# Patient Record
Sex: Male | Born: 1970 | Race: Black or African American | Hispanic: No | Marital: Married | Smoking: Former smoker
Health system: Southern US, Community
[De-identification: ages and names within clinical notes are randomized; demographics above are authoritative.]

## PROBLEM LIST (undated history)

## (undated) DIAGNOSIS — M1990 Unspecified osteoarthritis, unspecified site: Secondary | ICD-10-CM

## (undated) DIAGNOSIS — I10 Essential (primary) hypertension: Secondary | ICD-10-CM

## (undated) DIAGNOSIS — IMO0001 Reserved for inherently not codable concepts without codable children: Secondary | ICD-10-CM

## (undated) DIAGNOSIS — I2510 Atherosclerotic heart disease of native coronary artery without angina pectoris: Secondary | ICD-10-CM

## (undated) DIAGNOSIS — E119 Type 2 diabetes mellitus without complications: Secondary | ICD-10-CM

## (undated) DIAGNOSIS — N182 Chronic kidney disease, stage 2 (mild): Secondary | ICD-10-CM

## (undated) DIAGNOSIS — K219 Gastro-esophageal reflux disease without esophagitis: Secondary | ICD-10-CM

## (undated) DIAGNOSIS — Z794 Long term (current) use of insulin: Secondary | ICD-10-CM

## (undated) DIAGNOSIS — M109 Gout, unspecified: Secondary | ICD-10-CM

## (undated) HISTORY — PX: SHOULDER SURGERY: SHX246

## (undated) HISTORY — PX: KNEE SURGERY: SHX244

## (undated) HISTORY — PX: FRACTURE SURGERY: SHX138

## (undated) HISTORY — DX: Gastro-esophageal reflux disease without esophagitis: K21.9

## (undated) HISTORY — PX: EYE SURGERY: SHX253

---

## 2008-10-30 IMAGING — CR DG FOOT COMPLETE 3+V*R*
3 series · 3 of 3 positions shown · non-contrast
Comparison: Ankle films on same date peri

CLINICAL DATA: Right foot pain after injury.

RIGHT FOOT COMPLETE - 3+ VIEW

[t foot ap right]
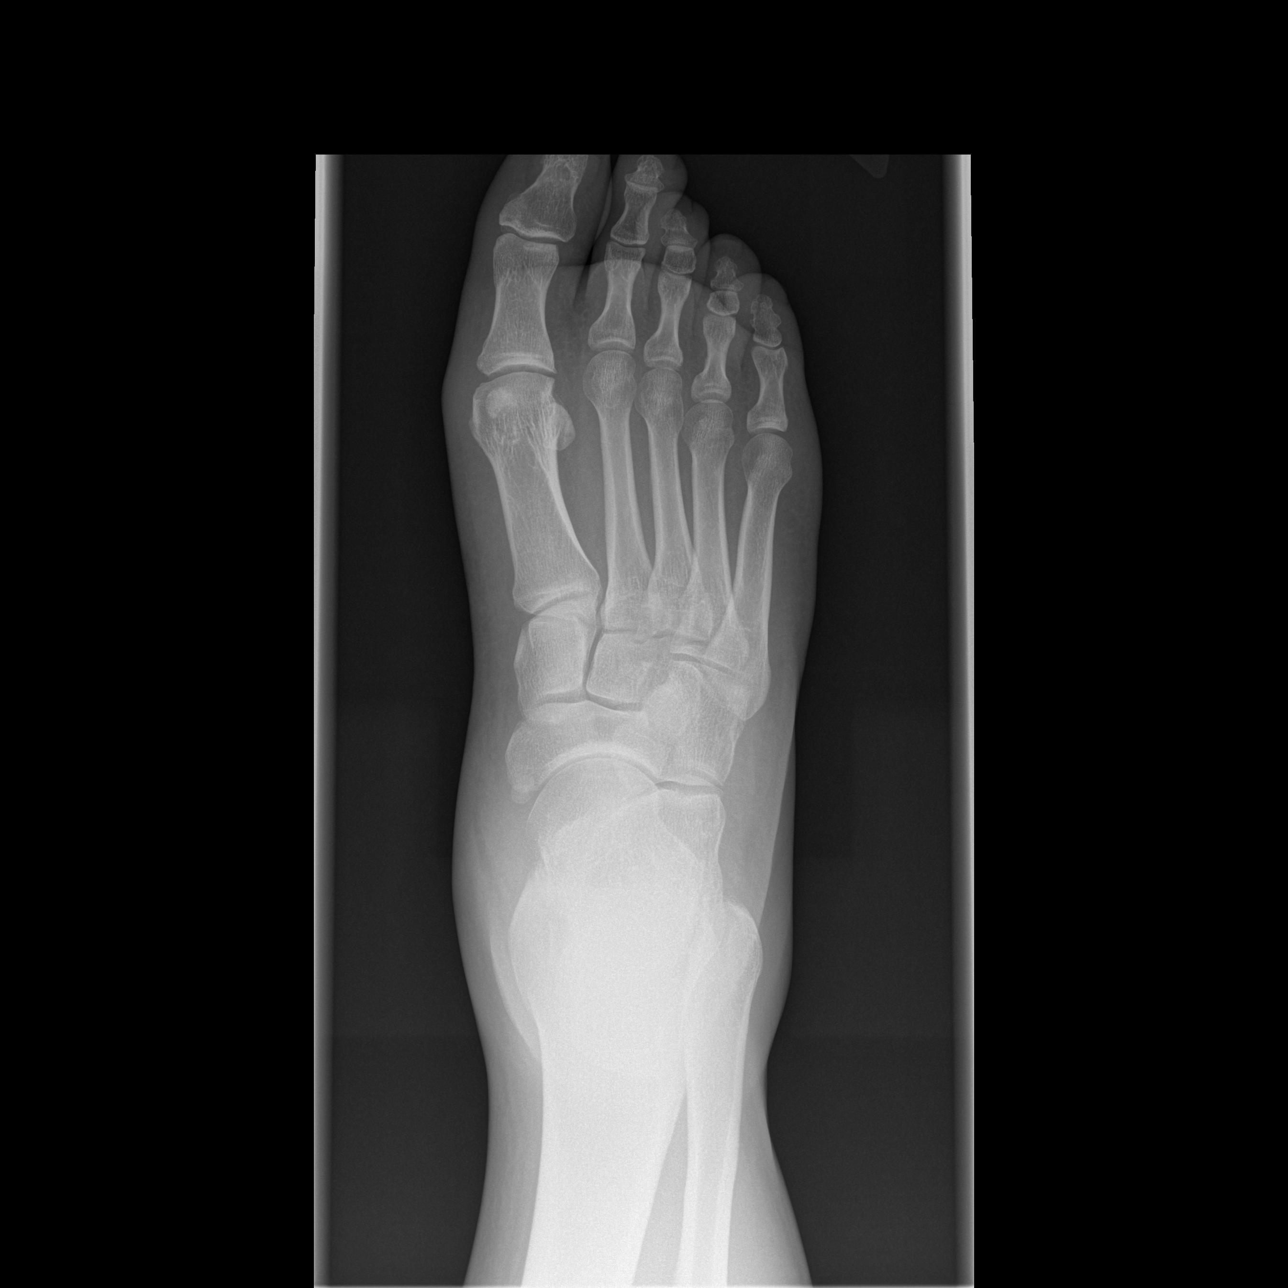

[t foot oblique right]
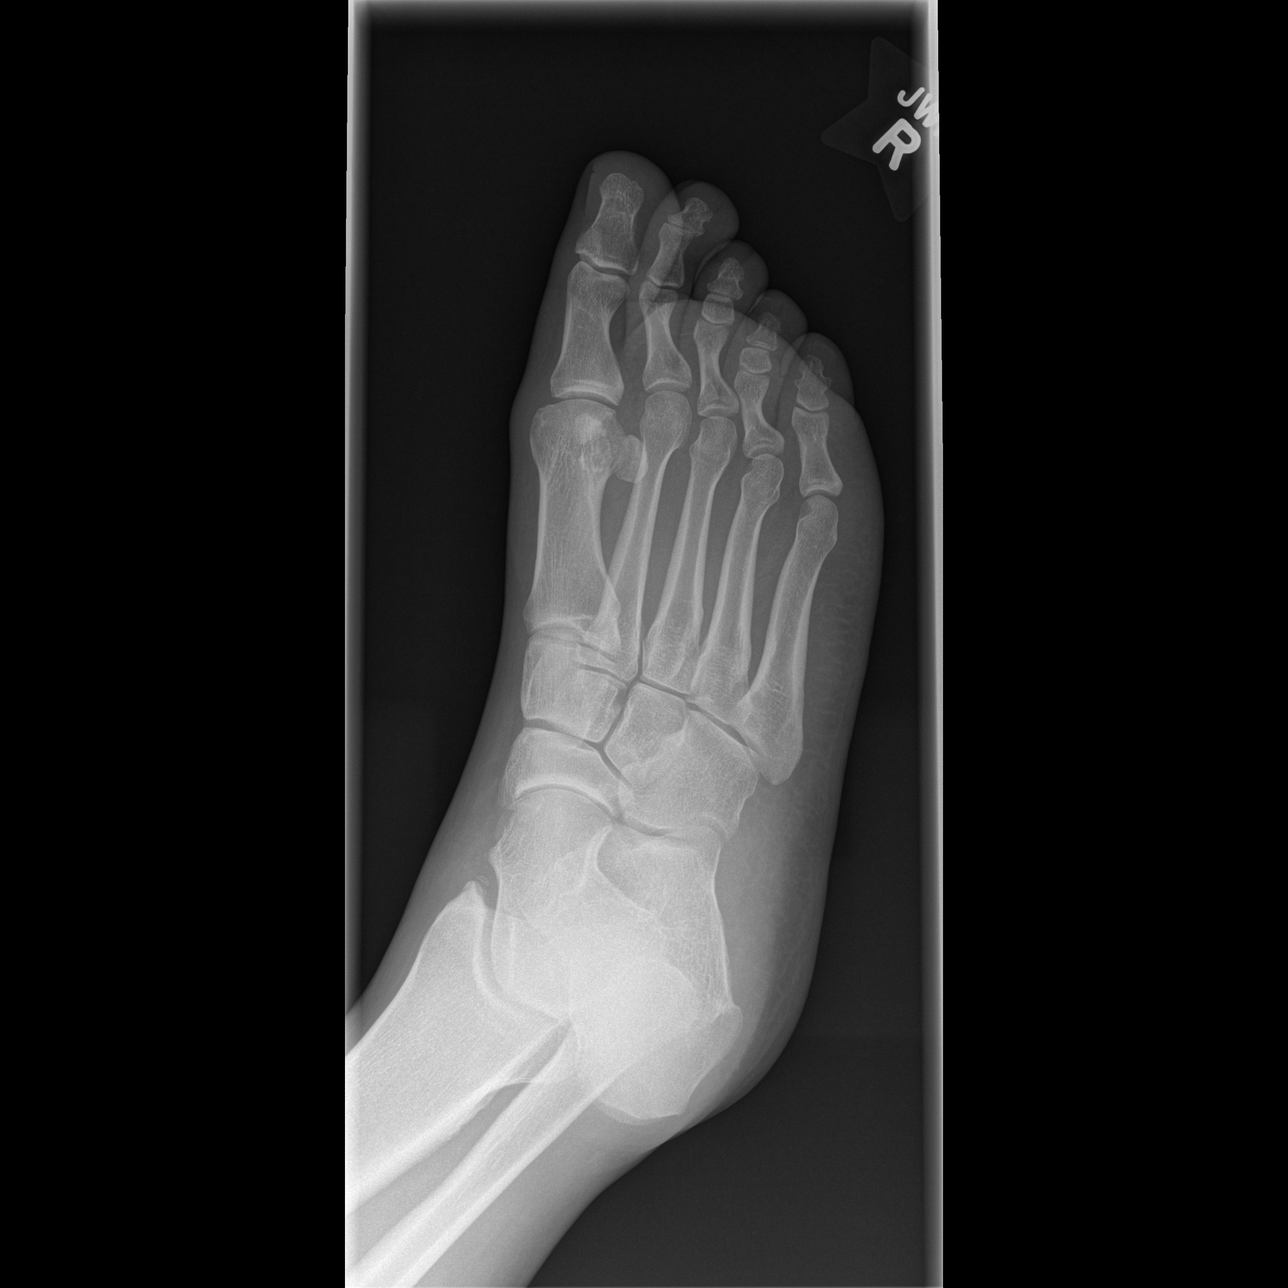

[t foot lat right]
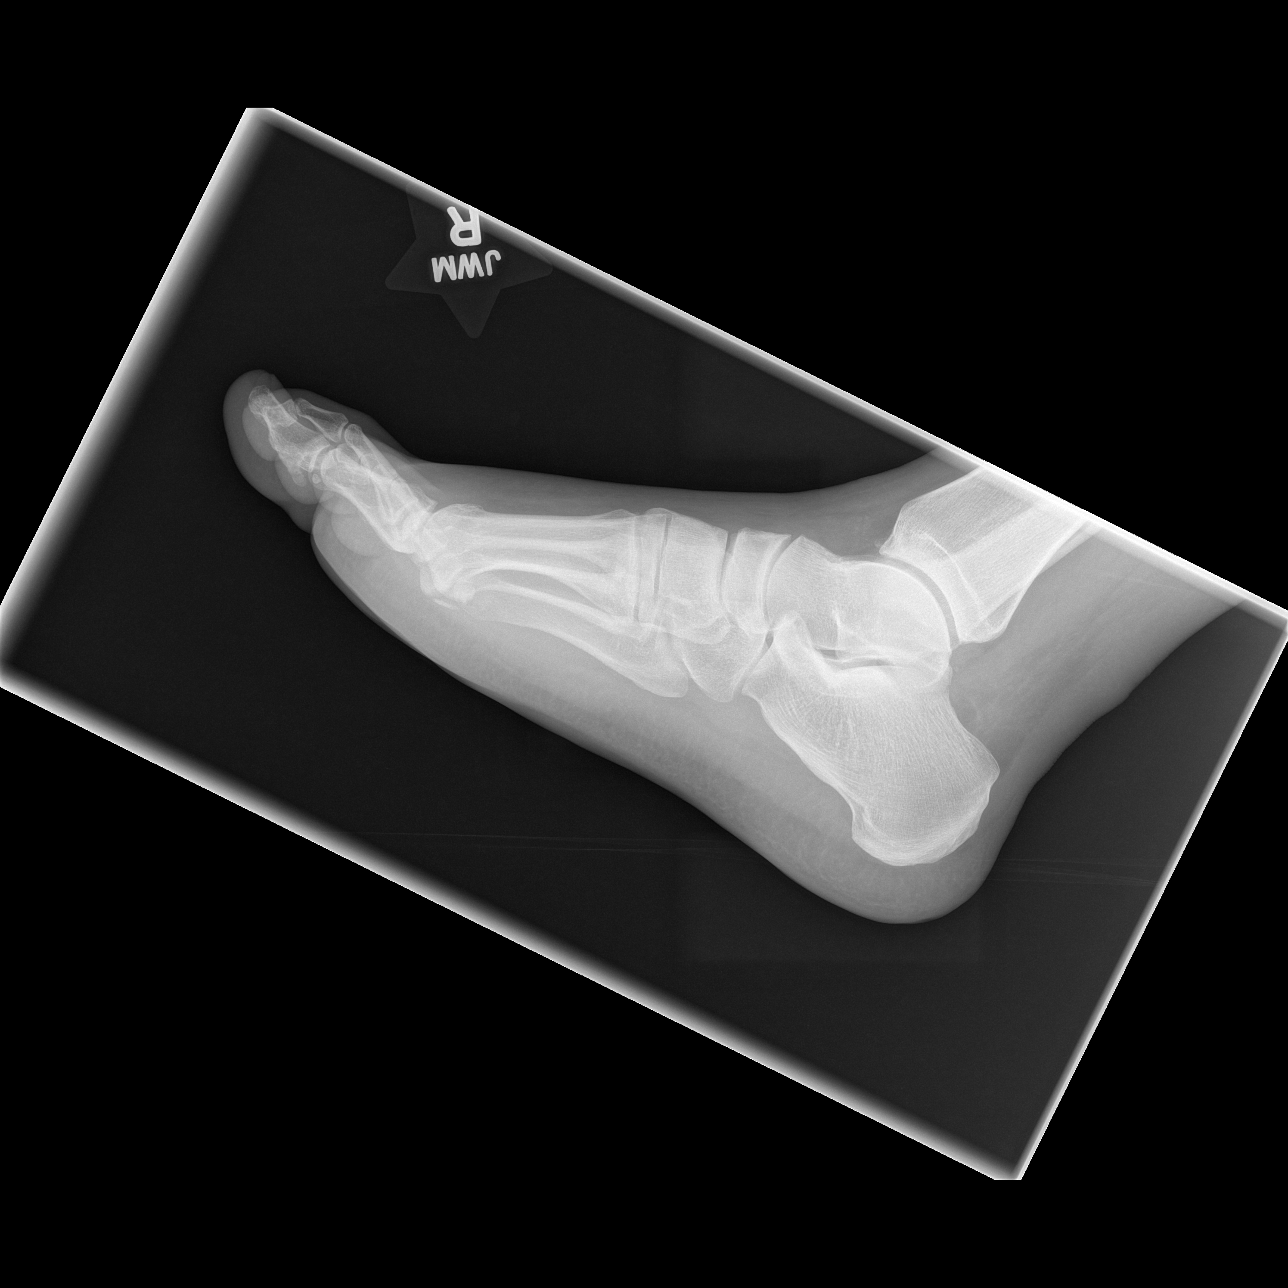

[3 of 3 positions shown; findings below may reference images not displayed]

FINDINGS: No evidence of foot fracture or dislocation.  Soft
tissues of the foot are unremarkable.
IMPRESSION: No fracture of the right foot.

## 2009-06-06 ENCOUNTER — Ambulatory Visit: Payer: Self-pay | Admitting: Interventional Radiology

## 2009-06-06 ENCOUNTER — Emergency Department (HOSPITAL_BASED_OUTPATIENT_CLINIC_OR_DEPARTMENT_OTHER): Admission: EM | Admit: 2009-06-06 | Discharge: 2009-06-06 | Payer: Self-pay | Admitting: Emergency Medicine

## 2009-06-06 IMAGING — CR DG ANKLE COMPLETE 3+V*R*
4 series · 4 of 4 positions shown · non-contrast
Comparison: None

CLINICAL DATA: Right ankle injury with swelling and pain.

RIGHT ANKLE - COMPLETE 3+ VIEW

[t ankle joint lat right]
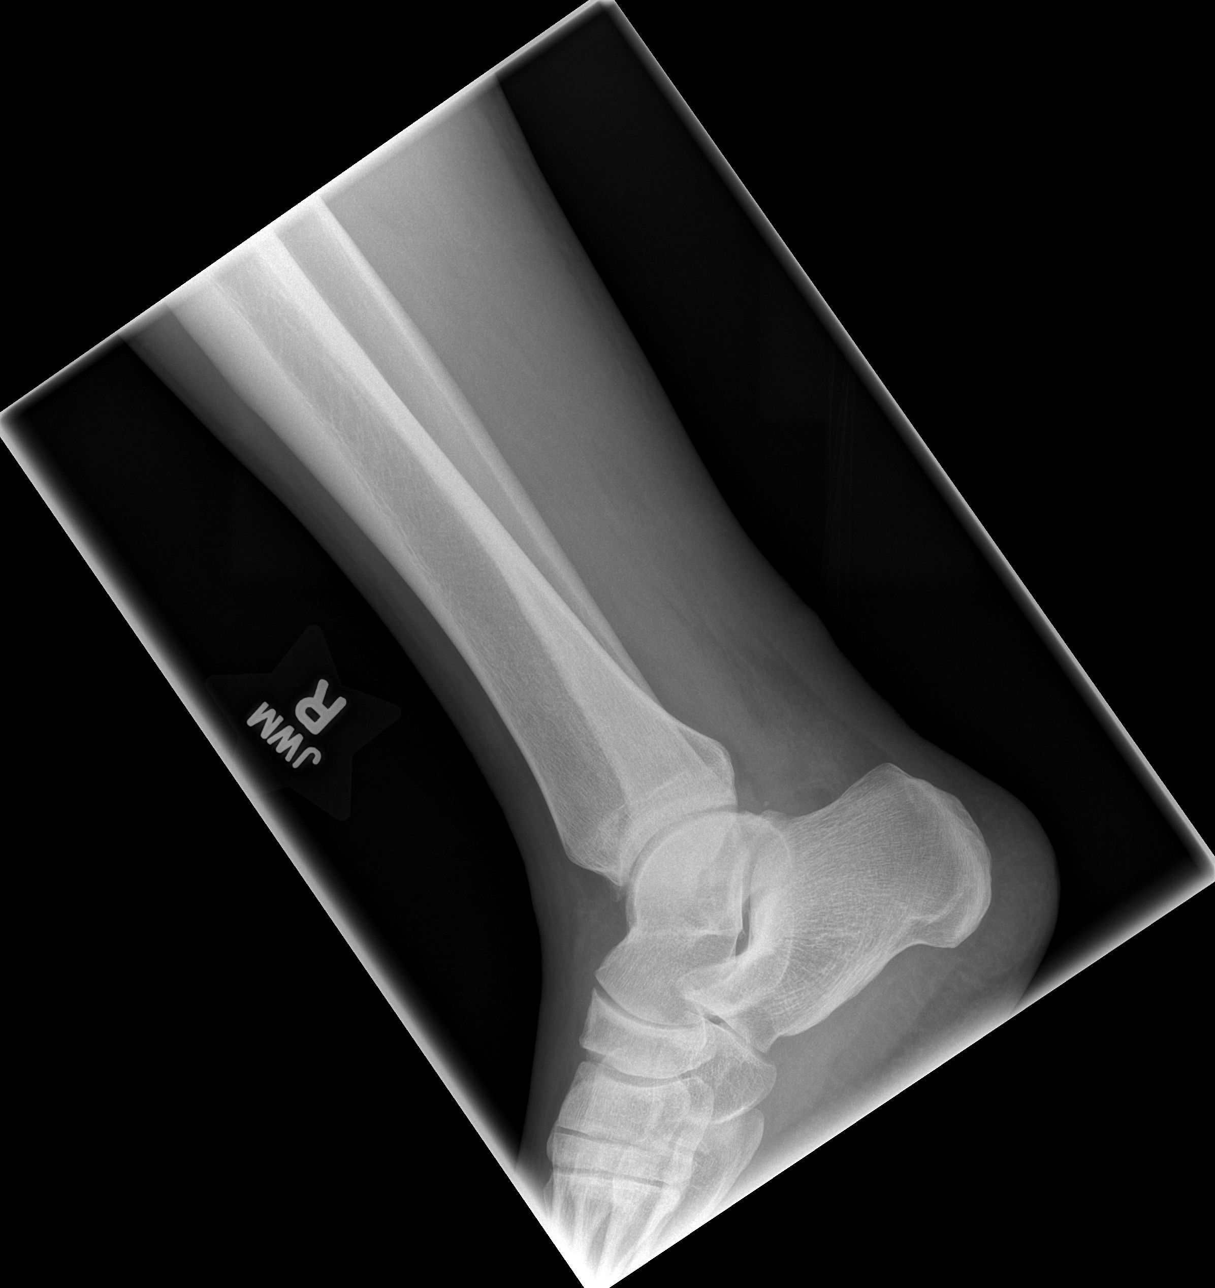

[t ankle joint ap right]
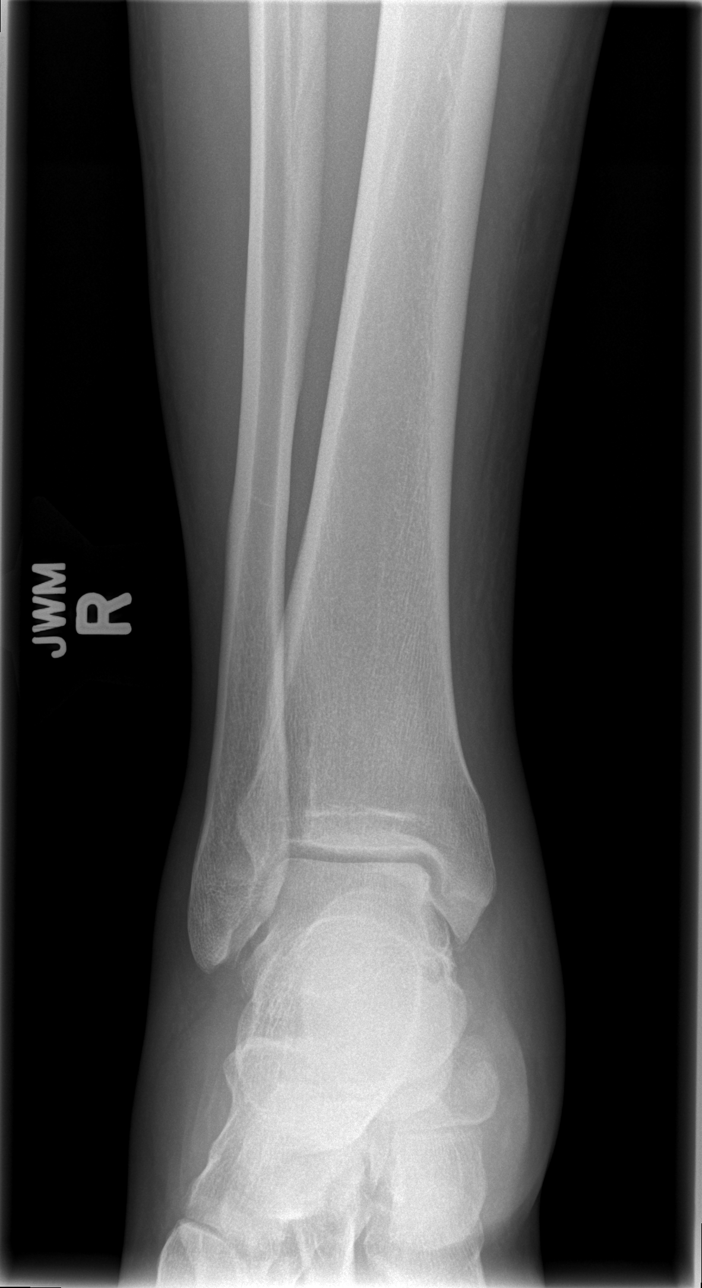

[t ankle joint oblique right (1 of 2)]
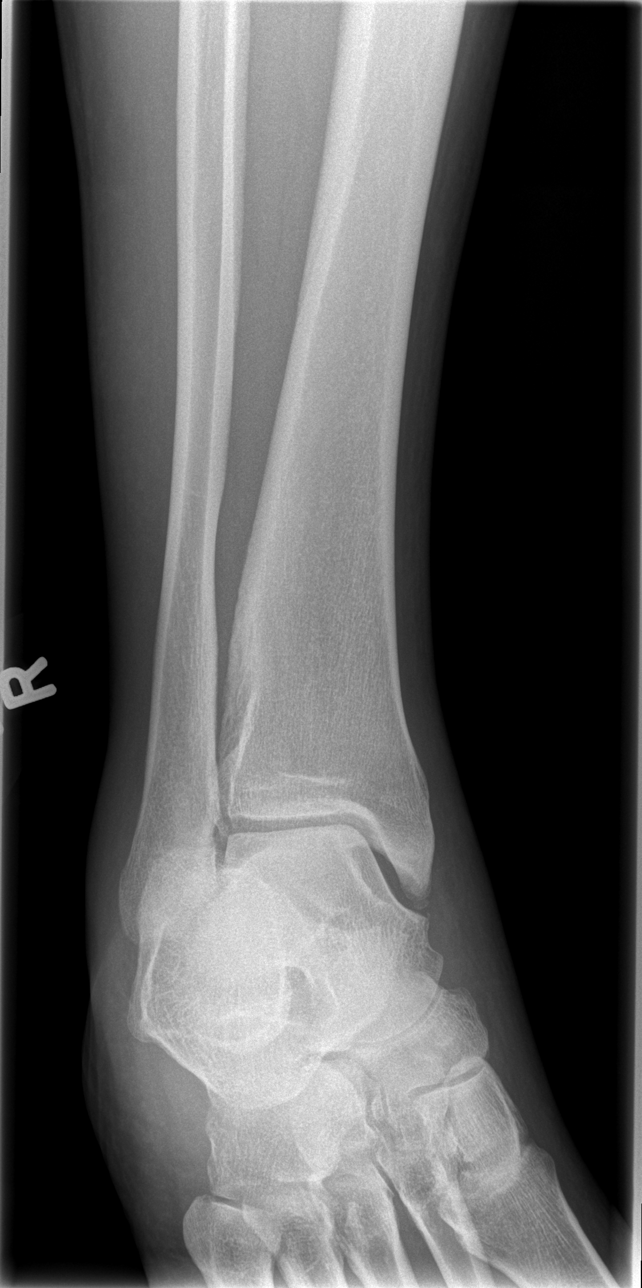

[t ankle joint oblique right (2 of 2)]
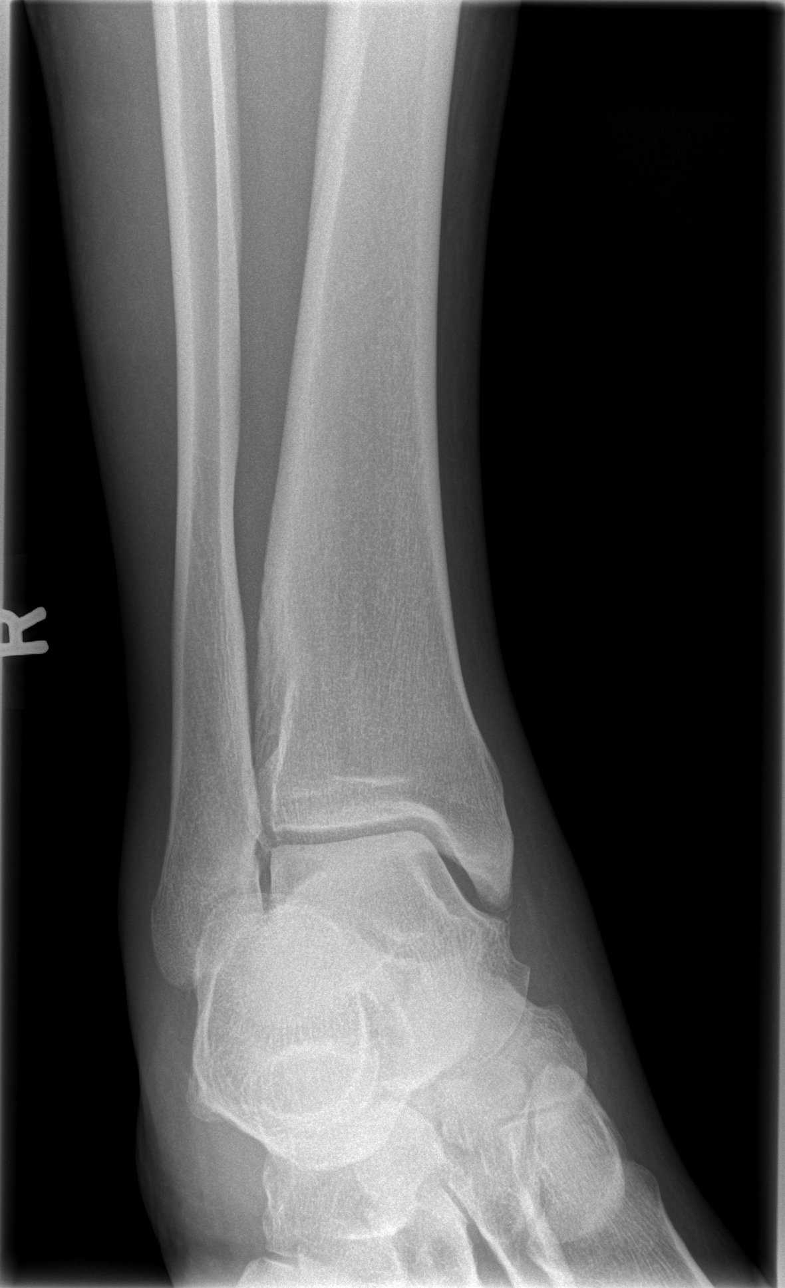

[4 of 4 positions shown; findings below may reference images not displayed]

FINDINGS: No acute bony fracture or dislocation identified.  There
is soft tissue prominence and thickening in the region of the
Achilles tendon.  Tendon injury cannot be excluded.
IMPRESSION: No bony fracture or dislocation.  Suspected Achilles tendon injury
based on soft tissue prominence.

## 2012-03-29 IMAGING — CR DG KNEE 1-2V*R*
2 series · 2 of 2 positions shown · non-contrast
Comparison: Contralateral knee

CLINICAL DATA: Knee pain

RIGHT KNEE - 1-2 VIEW

[t knee ap right]
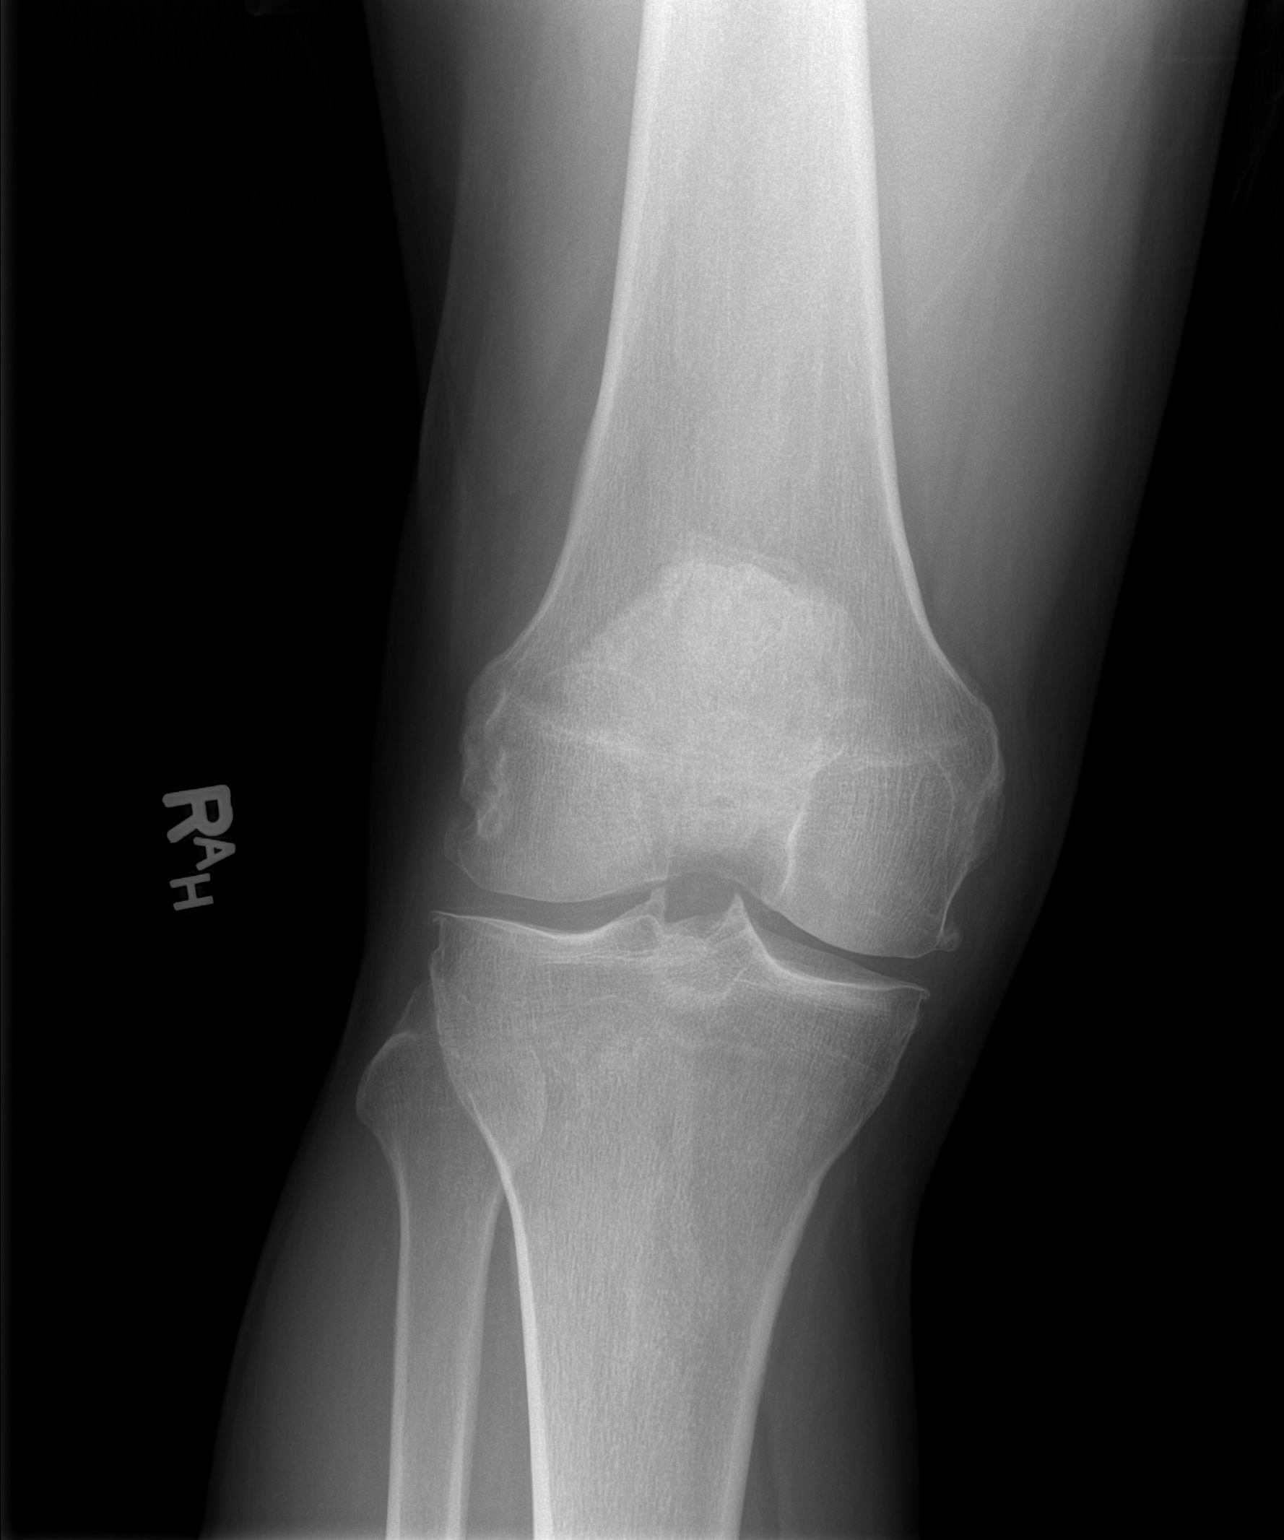

[t knee lat right]
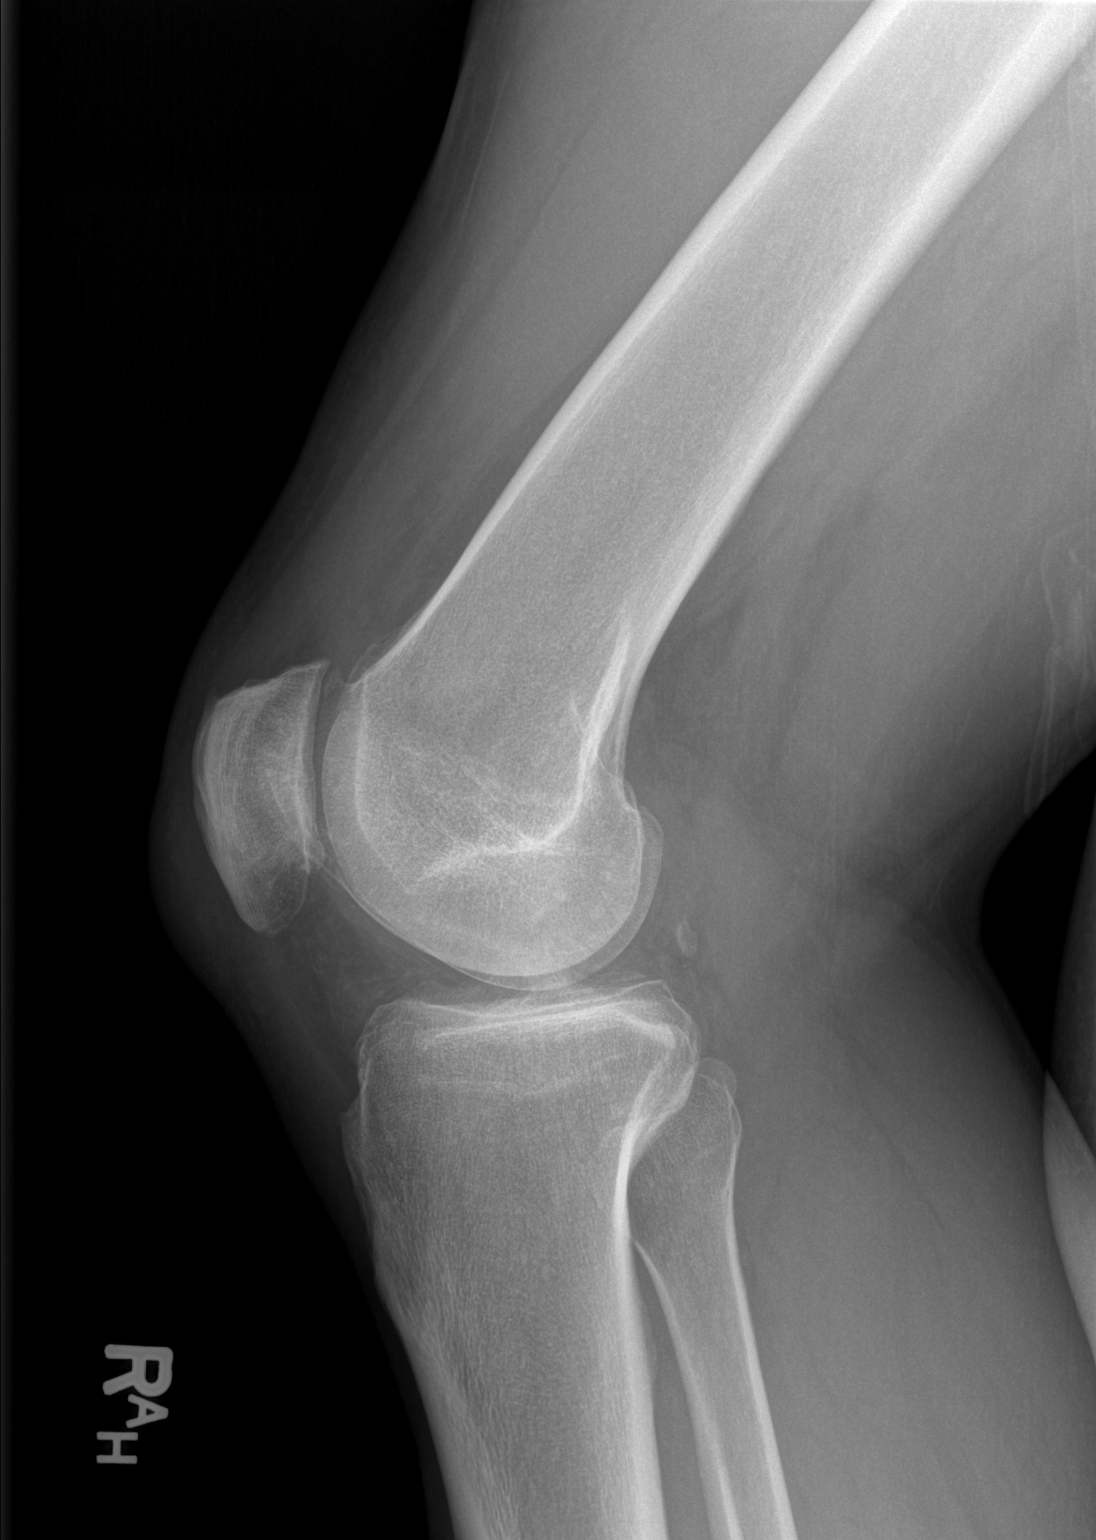

[2 of 2 positions shown; findings below may reference images not displayed]

FINDINGS: Mild tricompartmental degenerative changes.  No acute
fracture or dislocation.  Tiny joint effusion.  No aggressive
osseous lesions.
IMPRESSION: Tricompartmental degenerative changes and tiny joint effusion.

## 2012-08-21 ENCOUNTER — Other Ambulatory Visit: Payer: Self-pay

## 2012-08-21 ENCOUNTER — Encounter (HOSPITAL_BASED_OUTPATIENT_CLINIC_OR_DEPARTMENT_OTHER): Payer: Self-pay | Admitting: *Deleted

## 2012-08-21 ENCOUNTER — Emergency Department (HOSPITAL_BASED_OUTPATIENT_CLINIC_OR_DEPARTMENT_OTHER)
Admission: EM | Admit: 2012-08-21 | Discharge: 2012-08-21 | Disposition: A | Discharge status: Home / Self Care | Payer: BC Managed Care – PPO | Attending: Emergency Medicine | Admitting: Emergency Medicine

## 2012-08-21 DIAGNOSIS — Z79899 Other long term (current) drug therapy: Secondary | ICD-10-CM | POA: Insufficient documentation

## 2012-08-21 DIAGNOSIS — E119 Type 2 diabetes mellitus without complications: Secondary | ICD-10-CM | POA: Insufficient documentation

## 2012-08-21 DIAGNOSIS — R7309 Other abnormal glucose: Secondary | ICD-10-CM | POA: Insufficient documentation

## 2012-08-21 DIAGNOSIS — R739 Hyperglycemia, unspecified: Secondary | ICD-10-CM

## 2012-08-21 LAB — CBC WITH DIFFERENTIAL/PLATELET
Basophils Absolute: 0 10*3/uL (ref 0.0–0.1)
Eosinophils Absolute: 0.1 10*3/uL (ref 0.0–0.7)
Eosinophils Relative: 2 % (ref 0–5)
MCH: 30.7 pg (ref 26.0–34.0)
MCV: 80.2 fL (ref 78.0–100.0)
Platelets: 234 10*3/uL (ref 150–400)
RDW: 12.2 % (ref 11.5–15.5)
WBC: 6.5 10*3/uL (ref 4.0–10.5)

## 2012-08-21 LAB — GLUCOSE, CAPILLARY
Glucose-Capillary: 374 mg/dL — ABNORMAL HIGH (ref 70–99)
Glucose-Capillary: 348 mg/dL — ABNORMAL HIGH (ref 70–99)

## 2012-08-21 LAB — URINALYSIS, ROUTINE W REFLEX MICROSCOPIC
Bilirubin Urine: NEGATIVE
Hgb urine dipstick: NEGATIVE
Protein, ur: NEGATIVE mg/dL
Urobilinogen, UA: 1 mg/dL (ref 0.0–1.0)

## 2012-08-21 LAB — BASIC METABOLIC PANEL
Calcium: 8.9 mg/dL (ref 8.4–10.5)
GFR calc non Af Amer: >90 mL/min (ref >90)
Glucose, Bld: 409 mg/dL — ABNORMAL HIGH (ref 70–99)
Sodium: 131 mEq/L — ABNORMAL LOW (ref 135–145)

## 2012-08-21 MED ORDER — INSULIN REGULAR HUMAN 100 UNIT/ML IJ SOLN
INTRAMUSCULAR | Status: AC
  Filled 2012-08-21: qty 1

## 2012-08-21 MED ORDER — INSULIN REGULAR HUMAN 100 UNIT/ML IJ SOLN
10.0000 [IU] | Freq: Once | INTRAMUSCULAR | Status: AC
  Administered 2012-08-21: 10 [IU] via INTRAVENOUS

## 2012-08-21 MED ORDER — SODIUM CHLORIDE 0.9 % IV BOLUS (SEPSIS)
1000.0000 mL | Freq: Once | INTRAVENOUS | Status: AC
  Administered 2012-08-21: 1000 mL via INTRAVENOUS

## 2012-08-21 NOTE — ED Notes (Signed)
Took patient's blood sugar and result was: 374. Nurse was notified.

## 2012-08-21 NOTE — ED Notes (Signed)
Pt woke from sleep around 3 am with dizziness. Pt denies any syncope, no CP or SOB. Pt admits to being a diabetic but has been out of meds for few months and has not been monitoring blood sugars either.

## 2012-08-21 NOTE — ED Provider Notes (Addendum)
History     CSN: [id number]  Arrival date & time 08/21/12  9629   First MD Initiated Contact with Patient 08/21/12 [phone]      Chief Complaint  Patient presents with  . Dizziness    (Consider location/radiation/quality/duration/timing/severity/associated sxs/prior treatment) HPI Pt with history of DM reports he has been out of his Victoza for about 3 months and stopped taking the glyburide due to diarrhea. He has had increased thirst and urination recently, but no vomiting and no fever. He reports he got up from sleep to urinate this morning and began to feel dizzy. Felt like he was going to pass out but never lost consciousness. He denies any CP, SOB, headache or blurry vision.   Past Medical History  Diagnosis Date  . Diabetes mellitus without complication     Past Surgical History  Procedure Date  . Knee surgery     History reviewed. No pertinent family history.  History  Substance Use Topics  . Smoking status: Never Smoker   . Smokeless tobacco: Not on file  . Alcohol Use: Yes      Review of Systems All other systems reviewed and are negative except as noted in HPI.   Allergies  Review of patient's allergies indicates no known allergies.  Home Medications   Current Outpatient Rx  Name  Route  Sig  Dispense  Refill  . VICTOZA [place]   Subcutaneous   Inject into the skin.         Marland Kitchen GLUCOSE BLOOD VI STRP   Other   1 each by Other route as needed. Use as instructed         . GLYBURIDE 5 MG PO TABS   Oral   Take 5 mg by mouth daily with breakfast.           BP 159/101  Pulse 87  Temp 98 F (36.7 C) (Oral)  Resp 18  Ht 5\' 8"  (1.727 m)  Wt 205 lb (92.987 kg)  BMI 31.17 kg/m2  SpO2 100%  Physical Exam  Nursing note and vitals reviewed. Constitutional: He is oriented to person, place, and time. He appears well-developed and well-nourished.  HENT:  Head: Normocephalic and atraumatic.       Dry membranes  Eyes: EOM are normal. Pupils are equal,  round, and reactive to light.  Neck: Normal range of motion. Neck supple.  Cardiovascular: Normal rate, normal heart sounds and intact distal pulses.   Pulmonary/Chest: Effort normal and breath sounds normal.  Abdominal: Bowel sounds are normal. He exhibits no distension. There is no tenderness.  Musculoskeletal: Normal range of motion. He exhibits no edema and no tenderness.  Neurological: He is alert and oriented to person, place, and time. He has normal strength. No cranial nerve deficit or sensory deficit.  Skin: Skin is warm and dry. No rash noted.  Psychiatric: He has a normal mood and affect.    ED Course  Procedures (including critical care time)  Labs Reviewed  GLUCOSE, CAPILLARY - Abnormal; Notable for the following:    Glucose-Capillary 374 (*)     All other components within normal limits  BASIC METABOLIC PANEL - Abnormal; Notable for the following:    Sodium 131 (*)     Chloride 95 (*)     Glucose, Bld 409 (*)     All other components within normal limits  CBC WITH DIFFERENTIAL - Abnormal; Notable for the following:    HCT 36.9 (*)     MCHC 38.2 (*)  RULED OUT INTERFERING SUBSTANCES   All other components within normal limits  URINALYSIS, ROUTINE W REFLEX MICROSCOPIC   No results found.   1. Hyperglycemia       MDM   Date: 08/21/2012  Rate: 84  Rhythm: normal sinus rhythm  QRS Axis: normal  Intervals: normal  ST/T Wave abnormalities: normal  Conduction Disutrbances: none  Narrative Interpretation: unremarkable   6:44 AM Hyperglycemia without acidosis. Will recheck sugar after fluid bolus.    Care signed out to Dr. Preston Fleeting at the change of shift.       Charles B. Bernette Mayers, MD 08/21/12 [phone]  Blood sugar has come down to 301. He is on relatively low dose of glyburide, and he will need to discuss with his PCP whether to increase the dose of that or the dose of his liraglutide.  Dione Booze, MD 08/21/12 [phone]

## 2012-08-21 NOTE — ED Notes (Signed)
Took patient's blood sugar, result was: 348. Nurse notified.

## 2012-08-21 NOTE — ED Notes (Signed)
Pt states he woke from sleep to go use restroom around 3 am and had dizziness. Pt denies CP or SOB, no syncope. Pt is a known diabetic but has not been taking meds for few months.

## 2012-08-21 NOTE — ED Notes (Signed)
Patient is resting comfortably. 

## 2012-11-02 ENCOUNTER — Encounter (HOSPITAL_BASED_OUTPATIENT_CLINIC_OR_DEPARTMENT_OTHER): Payer: Self-pay | Admitting: *Deleted

## 2012-11-02 DIAGNOSIS — M25469 Effusion, unspecified knee: Secondary | ICD-10-CM | POA: Insufficient documentation

## 2012-11-02 DIAGNOSIS — Z87828 Personal history of other (healed) physical injury and trauma: Secondary | ICD-10-CM | POA: Insufficient documentation

## 2012-11-02 DIAGNOSIS — E119 Type 2 diabetes mellitus without complications: Secondary | ICD-10-CM | POA: Insufficient documentation

## 2012-11-02 DIAGNOSIS — Z79899 Other long term (current) drug therapy: Secondary | ICD-10-CM | POA: Insufficient documentation

## 2012-11-02 NOTE — ED Notes (Signed)
Pt describes bilat knee pain. Left is worse and more swollen. "Old football injury that I have been dealing with for years." No new injury

## 2012-11-03 ENCOUNTER — Emergency Department (HOSPITAL_BASED_OUTPATIENT_CLINIC_OR_DEPARTMENT_OTHER)
Admit: 2012-11-03 | Discharge: 2012-11-03 | Disposition: A | Discharge status: Home / Self Care | Payer: BC Managed Care – PPO | Source: Ambulatory Visit | Attending: Emergency Medicine | Admitting: Emergency Medicine

## 2012-11-03 ENCOUNTER — Encounter (HOSPITAL_BASED_OUTPATIENT_CLINIC_OR_DEPARTMENT_OTHER): Payer: Self-pay | Admitting: Emergency Medicine

## 2012-11-03 ENCOUNTER — Emergency Department (HOSPITAL_BASED_OUTPATIENT_CLINIC_OR_DEPARTMENT_OTHER)
Admission: EM | Admit: 2012-11-03 | Discharge: 2012-11-03 | Disposition: A | Discharge status: Home / Self Care | Payer: BC Managed Care – PPO | Attending: Emergency Medicine | Admitting: Emergency Medicine

## 2012-11-03 DIAGNOSIS — M79609 Pain in unspecified limb: Secondary | ICD-10-CM | POA: Insufficient documentation

## 2012-11-03 DIAGNOSIS — M25461 Effusion, right knee: Secondary | ICD-10-CM

## 2012-11-03 DIAGNOSIS — M7989 Other specified soft tissue disorders: Secondary | ICD-10-CM | POA: Insufficient documentation

## 2012-11-03 IMAGING — CR DG KNEE 1-2V*L*
2 series · 2 of 2 positions shown · non-contrast
Comparison: None.

CLINICAL DATA: Left knee swelling and bilateral knee pain.

LEFT KNEE - 1-2 VIEW

[t knee ap left]
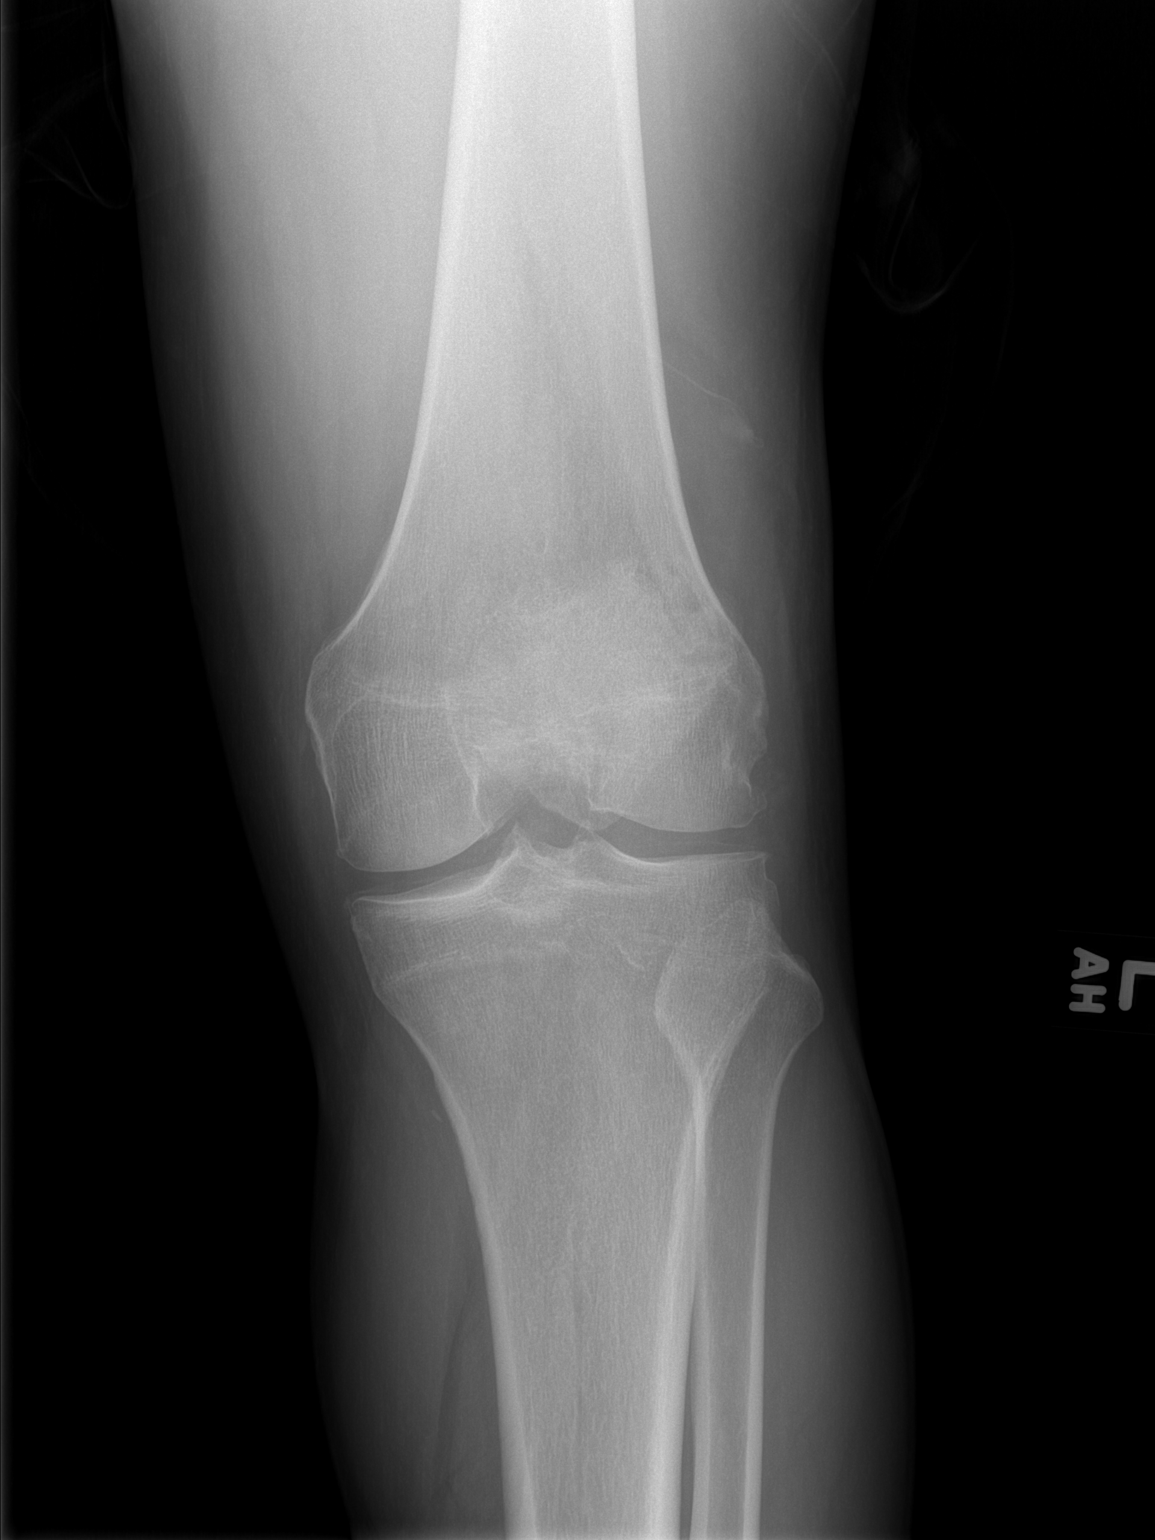

[t knee lat left]
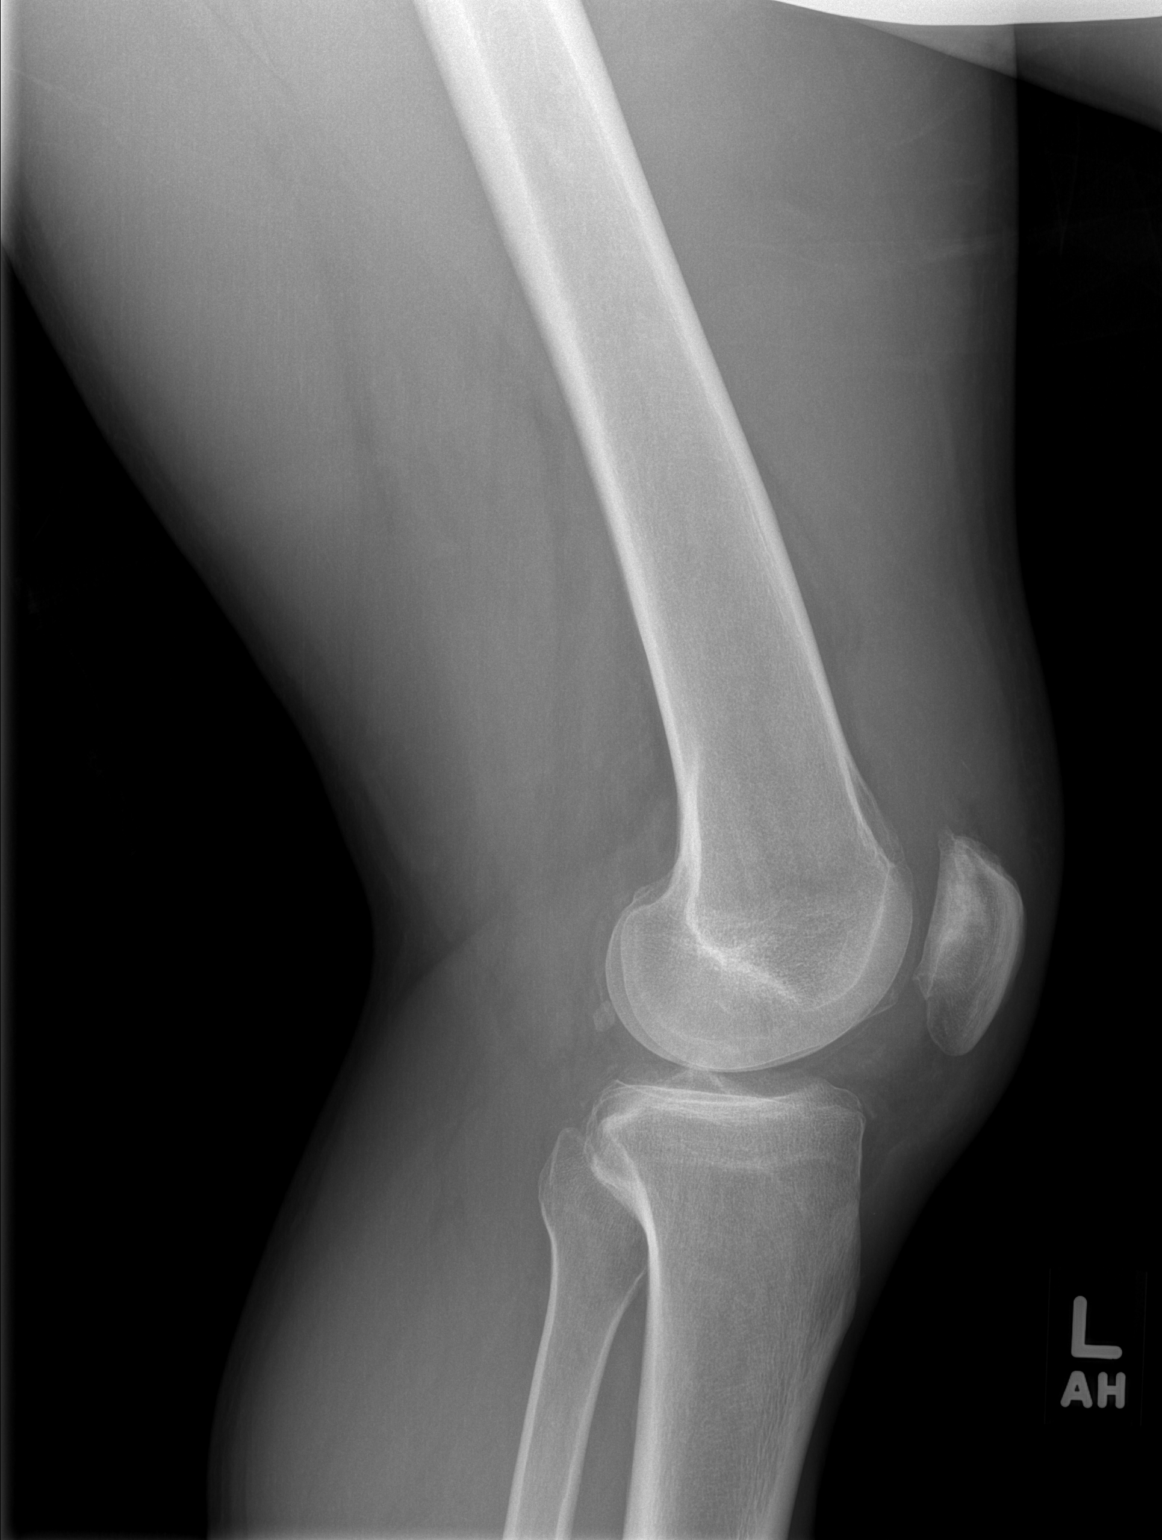

[2 of 2 positions shown; findings below may reference images not displayed]

FINDINGS: There is soft tissue fullness about the patella,
presumably reflecting a large knee joint effusion.  There is
nonspecific subchondral sclerosis of the patella and mild lateral
compartment degenerative change.  No acute fracture or dislocation.
IMPRESSION: Large knee joint effusion.

Subchondral sclerosis of the patella is often degenerative or
sequelae of prior trauma.

Correlate clinically if concerned for infection of the above.

## 2012-11-03 MED ORDER — LIDOCAINE-EPINEPHRINE 2 %-1:100000 IJ SOLN
INTRAMUSCULAR | Status: AC
  Administered 2012-11-03: 1 mL
  Filled 2012-11-03: qty 1

## 2012-11-03 MED ORDER — OXYCODONE-ACETAMINOPHEN 5-325 MG PO TABS
2.0000 | ORAL_TABLET | Freq: Once | ORAL | Status: AC
  Administered 2012-11-03: 2 via ORAL
  Filled 2012-11-03 (×2): qty 2

## 2012-11-03 MED ORDER — OXYCODONE-ACETAMINOPHEN 5-325 MG PO TABS: 1.0000 | ORAL_TABLET | Freq: Four times a day (QID) | ORAL | Status: DC | PRN

## 2012-11-03 NOTE — ED Provider Notes (Addendum)
History     CSN: [id number]  Arrival date & time 11/02/12  2344   First MD Initiated Contact with Patient 11/03/12 0210      Chief Complaint  Patient presents with  . Knee Pain    (Consider location/radiation/quality/duration/timing/severity/associated sxs/prior treatment) Patient is a 42 y.o. male presenting with knee pain. The history is provided by the patient. No language interpreter was used.  Knee Pain This is a chronic problem. The current episode started more than 1 week ago (20 years ). The problem occurs constantly. The problem has been rapidly worsening. Associated symptoms comments: none. The symptoms are aggravated by walking. Nothing relieves the symptoms. He has tried nothing for the symptoms. The treatment provided no relief.  Attributes pain and swelling to an old football injury 20 years ago.  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  Is unable to recollect an injury to the knee but states it's possible.  Patient states they swell intermittently and Dr. Charlann Boxer has done surgery on the right knee in the past.  No fevers, no chills, no rashes or the skin or redness.  Swelling and pain worse for over a week.    Past Medical History  Diagnosis Date  . Diabetes mellitus without complication     Past Surgical History  Procedure Date  . Knee surgery     History reviewed. No pertinent family history.  History  Substance Use Topics  . Smoking status: Never Smoker   . Smokeless tobacco: Not on file  . Alcohol Use: Yes      Review of Systems  All other systems reviewed and are negative.    Allergies  Review of patient's allergies indicates no known allergies.  Home Medications   Current Outpatient Rx  Name  Route  Sig  Dispense  Refill  . GLUCOSE BLOOD VI STRP   Other   1 each by Other route as needed. Use as instructed         . GLYBURIDE 5 MG PO TABS   Oral   Take 5 mg by mouth daily with breakfast.         . VICTOZA [place]   Subcutaneous   Inject  into the skin.           BP 118/82  Pulse 106  Temp 98.4 F (36.9 C) (Oral)  Resp 20  Ht 5\' 8"  (1.727 m)  Wt 200 lb (90.719 kg)  BMI 30.41 kg/m2  SpO2 100%  Physical Exam  Constitutional: He is oriented to person, place, and time. He appears well-developed and well-nourished. No distress.  HENT:  Head: Normocephalic and atraumatic.  Eyes: Conjunctivae normal and EOM are normal.  Neck: Normal range of motion.  Cardiovascular: Normal rate and regular rhythm.   Pulmonary/Chest: Effort normal and breath sounds normal. He has no wheezes. He has no rales.  Abdominal: Soft. Bowel sounds are normal. There is no tenderness.  Musculoskeletal: Normal range of motion.       Swollen tense left knee.  Not ballotable.  Intact dorsaolis pedis.  Intact quadriceps and patellar tendons.  No laxity to varus or valgus.  Negative anterior and posterior drawer tests B  Neurological: He is alert and oriented to person, place, and time. He has normal reflexes.  Skin: Skin is warm and dry. No rash noted. No erythema. No pallor.  Psychiatric: He has a normal mood and affect.    ED Course  Procedures (including critical care time)  Labs Reviewed -  No data to display Dg Knee 2 Views Left  11/03/2012  *RADIOLOGY REPORT*  Clinical Data: Left knee swelling and bilateral knee pain.  LEFT KNEE - 1-2 VIEW  Comparison: None.  Findings: There is soft tissue fullness about the patella, presumably reflecting a large knee joint effusion.  There is nonspecific subchondral sclerosis of the patella and mild lateral compartment degenerative change.  No acute fracture or dislocation.  IMPRESSION: Large knee joint effusion.  Subchondral sclerosis of the patella is often degenerative or sequelae of prior trauma.  Correlate clinically if concerned for infection of the above.   Original Report Authenticated By: Jearld Lesch, M.D.    Dg Knee 2 Views Right  11/03/2012  *RADIOLOGY REPORT*  Clinical Data: Knee pain  RIGHT  KNEE - 1-2 VIEW  Comparison: Contralateral knee  Findings: Mild tricompartmental degenerative changes.  No acute fracture or dislocation.  Tiny joint effusion.  No aggressive osseous lesions.  IMPRESSION: Tricompartmental degenerative changes and tiny joint effusion.   Original Report Authenticated By: Jearld Lesch, M.D.      No diagnosis found.    MDM  Apiration of blood/fluid Performed by: Jasmine Awe Consent obtained. Required items: required blood products, implants, devices, and special equipment available Patient identity confirmed: verbally with patient Risks: bleeding and  Infection and pain Time out: Immediately prior to procedure a "time out" was called to verify the correct patient, procedure, equipment, support staff and  Preparation: Patient was prepped and draped in the usual sterile fashion. EDP gowned, masked and used sterile surgical gloves Patient tolerance: Patient tolerated the procedure well with no immediate complications. Location of aspiration:Left lateral knee  Cleansed 4 times with chlorhexidine and then 4 additional times with povidone.    Injected with 4 cc lidocaine with epinephrine.  Waiting until numb.  Small amount of clotted material into syringe.  No additional fluid obtained   Suspect trauma and clotted blood as effusion.  Patient unable to tolerate further attempt.  Will place in knee immobilizer and follow up with orthopedics.  Return for fevers chills numbness or weakness calf swelling or any concerns.  Patient verbalizes understanding and agrees to follow up        Baraa Tubbs K Jatniel Verastegui-Rasch, MD 11/03/12 0536  Vannary Greening K Jahlani Lorentz-Rasch, MD 11/03/12 [phone]

## 2012-11-03 NOTE — ED Notes (Signed)
MD at bedside. 

## 2012-11-03 NOTE — ED Notes (Signed)
I applied padding above and below knee using cotton webril and secured with large kerlix , then I reapplied knee immobilizer. Patient comfort improved with padding.

## 2012-12-09 ENCOUNTER — Other Ambulatory Visit: Payer: Self-pay | Admitting: Orthopedic Surgery

## 2012-12-11 ENCOUNTER — Encounter (HOSPITAL_COMMUNITY): Payer: Self-pay | Admitting: Pharmacy Technician

## 2012-12-17 NOTE — Patient Instructions (Addendum)
[address]  12/17/2012   Your procedure is scheduled on: 12/25/12  Report to [facility] at 0800 AM.  Call this number if you have problems the morning of surgery [phone]: [phone]   Remember:   Do not eat food or drink liquids After Midnight.     Do not wear jewelry, make-up or nail polish.  Do not wear lotions, powders, or perfumes. You may wear deodorant.  Do not shave 48 hours prior to surgery. Men may shave face and neck.  Do not bring valuables to the hospital.  Contacts, dentures or bridgework may not be worn into surgery.   Patients discharged the day of surgery will not be allowed to drive home.  Name and phone number of your driver: Cicero Duck or brothers    Please read over the following fact sheets that you were given: MRSA Information.  Birdie Sons, RN  pre op nurse call if needed [phone]    FAILURE TO FOLLOW THESE INSTRUCTIONS MAY RESULT IN CANCELLATION OF YOUR SURGERY   Patient Signature: ___________________________________________

## 2012-12-17 NOTE — Progress Notes (Signed)
EKG 08/27/12 in [facility]

## 2012-12-18 ENCOUNTER — Encounter (HOSPITAL_COMMUNITY)
Admission: RE | Admit: 2012-12-18 | Discharge: 2012-12-18 | Disposition: A | Discharge status: Home / Self Care | Payer: BC Managed Care – PPO | Source: Ambulatory Visit | Attending: Specialist | Admitting: Specialist

## 2012-12-18 ENCOUNTER — Encounter (HOSPITAL_COMMUNITY): Payer: Self-pay

## 2012-12-18 HISTORY — DX: Gout, unspecified: M10.9

## 2012-12-18 HISTORY — DX: Unspecified osteoarthritis, unspecified site: M19.90

## 2012-12-18 LAB — CBC
HCT: 40.7 % (ref 39.0–52.0)
Hemoglobin: 15.4 g/dL (ref 13.0–17.0)
MCH: 31 pg (ref 26.0–34.0)
MCHC: 37.8 g/dL — ABNORMAL HIGH (ref 30.0–36.0)
RBC: 4.97 MIL/uL (ref 4.22–5.81)

## 2012-12-18 LAB — BASIC METABOLIC PANEL
BUN: 16 mg/dL (ref 6–23)
CO2: 29 mEq/L (ref 19–32)
Calcium: 9.4 mg/dL (ref 8.4–10.5)
GFR calc non Af Amer: 84 mL/min — ABNORMAL LOW (ref >90)
Glucose, Bld: 259 mg/dL — ABNORMAL HIGH (ref 70–99)

## 2012-12-24 ENCOUNTER — Other Ambulatory Visit: Payer: Self-pay | Admitting: Orthopedic Surgery

## 2012-12-24 NOTE — H&P (Signed)
Matthew Fisher is an 41 y.o. male.   Chief Complaint: left knee pain HPI: Left knee pain x 2 months following a specific injury (DOI 10/29/12). 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 The patient is unable to fully extend, it is popping and giving out on him and he continues to have swelling. No evidence of infection. He has had two aspirations and injections, multiple ER visits. He is unable to work. He continues to take analgesics. Known hx of gout.   Past Medical History  Diagnosis Date  . Diabetes mellitus without complication   . Gout   . Arthritis     Past Surgical History  Procedure Laterality Date  . Knee surgery      "removed bone"  . Fracture surgery      right ring finger    No family history on file. Social History:  reports that he quit smoking about 10 years ago. His smoking use included Cigarettes. He has a 10 pack-year smoking history. He has never used smokeless tobacco. He reports that  drinks alcohol. He reports that he does not use illicit drugs.  Allergies: No Known Allergies   (Not in a hospital admission)  No results found for this or any previous visit (from the past 48 hour(s)). No results found.  Review of Systems  Constitutional: Negative.   HENT: Negative.  Negative for neck pain.   Eyes: Negative.   Respiratory: Negative.   Cardiovascular: Negative.   Gastrointestinal: Negative.   Genitourinary: Negative.   Musculoskeletal: Positive for joint pain. Negative for back pain.  Skin: Negative.   Neurological: Negative.     There were no vitals taken for this visit. Physical Exam  Constitutional: He is oriented to person, place, and time. He appears well-developed and well-nourished.  HENT:  Head: Normocephalic and atraumatic.  Eyes: Pupils are equal, round, and reactive to light.  Neck: Normal range of motion. Neck supple.  Cardiovascular: Normal rate  and regular rhythm.   Respiratory: Effort normal and breath sounds normal.  GI: Soft. Bowel sounds are normal.  Musculoskeletal: He exhibits tenderness.  Left knee tender in the medial joint line, lateral joint line. Moderate effusion. Some tenderness of the patellofemoral joint. McMurray's is positive. Knee exam on inspection reveals no evidence of soft tissue swelling, ecchymosis, deformity or erythema. Nontender over the fibular head or the peroneal nerve. Nontender over the quadriceps insertion of the patellar ligament insertion. The range of motion was full. Provocative maneuvers revealed a negative Lachman's, negative anterior and posterior drawer. No instability was noted with varus and valgus stressing at zero or 30 degrees. On manual motor test, the quadriceps and hamstrings were five over five. Sensory exam was intact to light touch.   Neurological: He is alert and oriented to person, place, and time.  Skin: Skin is warm and dry.    MRI L knee demonstrates chondromalacia of the patellae and some edema and fragmentation of the ossicle of bipartite patella possible old injury, synchondrosis of the cartilage is intact. Standing knee xrays with mild medial DJD.  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 Pt cleared by PCP Dr. Hawks given recent hyperglycemia on labwork, uncontrolled DM. Medications have been adjusted and will be monitored post-op for proper glycemic control. Dr. Beane has previously discussed risks, complications, and alternatives with the   pt including but not limited to DVT, PE, infx, bleeding, worsening stiffness or arthritic symptoms, failure of procedure, need for secondary procedure, nerve injury, RSD, anesthesia risk, even death.  BISSELL, JACLYN M. 12/24/2012, 1:30 PM    

## 2012-12-25 ENCOUNTER — Encounter (HOSPITAL_COMMUNITY): Payer: Self-pay | Admitting: General Practice

## 2012-12-25 ENCOUNTER — Encounter (HOSPITAL_COMMUNITY): Payer: Self-pay | Admitting: Anesthesiology

## 2012-12-25 ENCOUNTER — Encounter (HOSPITAL_COMMUNITY)
Admission: RE | Disposition: A | Discharge status: Home / Self Care | Payer: Self-pay | Source: Ambulatory Visit | Attending: Specialist

## 2012-12-25 ENCOUNTER — Ambulatory Visit (HOSPITAL_COMMUNITY)
Admission: RE | Admit: 2012-12-25 | Discharge: 2012-12-25 | Disposition: A | Discharge status: Home / Self Care | Payer: BC Managed Care – PPO | Source: Ambulatory Visit | Attending: Specialist | Admitting: Specialist

## 2012-12-25 DIAGNOSIS — IMO0001 Reserved for inherently not codable concepts without codable children: Secondary | ICD-10-CM | POA: Insufficient documentation

## 2012-12-25 DIAGNOSIS — Z5309 Procedure and treatment not carried out because of other contraindication: Secondary | ICD-10-CM | POA: Insufficient documentation

## 2012-12-25 DIAGNOSIS — Z01812 Encounter for preprocedural laboratory examination: Secondary | ICD-10-CM | POA: Insufficient documentation

## 2012-12-25 DIAGNOSIS — M2240 Chondromalacia patellae, unspecified knee: Secondary | ICD-10-CM | POA: Insufficient documentation

## 2012-12-25 DIAGNOSIS — M109 Gout, unspecified: Secondary | ICD-10-CM | POA: Insufficient documentation

## 2012-12-25 SURGERY — ARTHROSCOPY, KNEE
Anesthesia: General | Site: Knee | Laterality: Left

## 2012-12-25 MED ORDER — CEFAZOLIN SODIUM-DEXTROSE 2-3 GM-% IV SOLR
INTRAVENOUS | Status: AC
  Filled 2012-12-25: qty 50

## 2012-12-25 MED ORDER — ACETAMINOPHEN 10 MG/ML IV SOLN
INTRAVENOUS | Status: AC
  Filled 2012-12-25: qty 100

## 2012-12-25 MED ORDER — CEFAZOLIN SODIUM-DEXTROSE 2-3 GM-% IV SOLR: 2.0000 g | INTRAVENOUS | Status: DC

## 2012-12-25 MED ORDER — LACTATED RINGERS IV SOLN: INTRAVENOUS | Status: DC

## 2012-12-25 MED ORDER — LACTATED RINGERS IV BOLUS (SEPSIS): 500.0000 mL | Freq: Once | INTRAVENOUS | Status: DC

## 2012-12-25 MED ORDER — LACTATED RINGERS IV SOLN
INTRAVENOUS | Status: DC
  Administered 2012-12-25: 08:00:00 via INTRAVENOUS

## 2012-12-25 NOTE — Progress Notes (Signed)
Dr. Renold Don discussed with Dr. Shelle Iron blood sugar of 329, new order received to cancel surgery, give bolus of LR and pt. To follow-up with office to reschedule surgery.

## 2012-12-25 NOTE — Progress Notes (Signed)
Dr.Germeroth notified of Blood sugar of 329, order received to start IV.

## 2012-12-25 NOTE — H&P (View-Only) (Signed)
Matthew Fisher is an 42 y.o. male.   Chief Complaint: left knee pain HPI: Left knee pain x 2 months following a specific injury (DOI 10/29/12). 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 The patient is unable to fully extend, it is popping and giving out on him and he continues to have swelling. No evidence of infection. He has had two aspirations and injections, multiple ER visits. He is unable to work. He continues to take analgesics. Known hx of gout.   Past Medical History  Diagnosis Date  . Diabetes mellitus without complication   . Gout   . Arthritis     Past Surgical History  Procedure Laterality Date  . Knee surgery      "removed bone"  . Fracture surgery      right ring finger    No family history on file. Social History:  reports that he quit smoking about 10 years ago. His smoking use included Cigarettes. He has a 10 pack-year smoking history. He has never used smokeless tobacco. He reports that  drinks alcohol. He reports that he does not use illicit drugs.  Allergies: No Known Allergies   (Not in a hospital admission)  No results found for this or any previous visit (from the past 48 hour(s)). No results found.  Review of Systems  Constitutional: Negative.   HENT: Negative.  Negative for neck pain.   Eyes: Negative.   Respiratory: Negative.   Cardiovascular: Negative.   Gastrointestinal: Negative.   Genitourinary: Negative.   Musculoskeletal: Positive for joint pain. Negative for back pain.  Skin: Negative.   Neurological: Negative.     There were no vitals taken for this visit. Physical Exam  Constitutional: He is oriented to person, place, and time. He appears well-developed and well-nourished.  HENT:  Head: Normocephalic and atraumatic.  Eyes: Pupils are equal, round, and reactive to light.  Neck: Normal range of motion. Neck supple.  Cardiovascular: Normal rate  and regular rhythm.   Respiratory: Effort normal and breath sounds normal.  GI: Soft. Bowel sounds are normal.  Musculoskeletal: He exhibits tenderness.  Left knee tender in the medial joint line, lateral joint line. Moderate effusion. Some tenderness of the patellofemoral joint. McMurray's is positive. Knee exam on inspection reveals no evidence of soft tissue swelling, ecchymosis, deformity or erythema. Nontender over the fibular head or the peroneal nerve. Nontender over the quadriceps insertion of the patellar ligament insertion. The range of motion was full. Provocative maneuvers revealed a negative Lachman's, negative anterior and posterior drawer. No instability was noted with varus and valgus stressing at zero or 30 degrees. On manual motor test, the quadriceps and hamstrings were five over five. Sensory exam was intact to light touch.   Neurological: He is alert and oriented to person, place, and time.  Skin: Skin is warm and dry.    MRI L knee demonstrates chondromalacia of the patellae and some edema and fragmentation of the ossicle of bipartite patella possible old injury, synchondrosis of the cartilage is intact. Standing knee xrays with mild medial DJD.  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 Pt cleared by PCP Dr. Lendon Colonel given recent hyperglycemia on labwork, uncontrolled DM. Medications have been adjusted and will be monitored post-op for proper glycemic control. Dr. Shelle Iron has previously discussed risks, complications, and alternatives with the  pt including but not limited to DVT, PE, infx, bleeding, worsening stiffness or arthritic symptoms, failure of procedure, need for secondary procedure, nerve injury, RSD, anesthesia risk, even death.  BISSELL, JACLYN M. 12/25/12, 1:30 PM

## 2012-12-25 NOTE — Progress Notes (Signed)
Dr. Renold Don in talking with patient, regarding surgery and  Blood sugars.

## 2012-12-25 NOTE — Anesthesia Preprocedure Evaluation (Deleted)
Anesthesia Evaluation  Patient identified by MRN, date of birth, ID band Patient awake    Reviewed: Allergy & Precautions, H&P , NPO status , Patient's Chart, lab work & pertinent test results  Airway       Dental  (+) Dental Advisory Given   Pulmonary neg pulmonary ROS,          Cardiovascular negative cardio ROS      Neuro/Psych negative neurological ROS  negative psych ROS   GI/Hepatic negative GI ROS, Neg liver ROS,   Endo/Other  negative endocrine ROSdiabetes, Poorly Controlled, Type 2  Renal/GU negative Renal ROS     Musculoskeletal  (+) Arthritis -, Osteoarthritis,    Abdominal (+) + obese,   Peds  Hematology negative hematology ROS (+)   Anesthesia Other Findings   Reproductive/Obstetrics                          Anesthesia Physical Anesthesia Plan  ASA: III  Anesthesia Plan: General   Post-op Pain Management:    Induction: Intravenous and Rapid sequence  Airway Management Planned: Oral ETT  Additional Equipment:   Intra-op Plan:   Post-operative Plan: Extubation in OR  Informed Consent: I have reviewed the patients History and Physical, chart, labs and discussed the procedure including the risks, benefits and alternatives for the proposed anesthesia with the patient or authorized representative who has indicated his/her understanding and acceptance.   Dental advisory given  Plan Discussed with: CRNA  Anesthesia Plan Comments: (Pt with uncontrolled DM2. Per pt. he ran out of glyburide a couple of weeks ago. Preop FSBG ~250 and 329 this AM. Pt was instructed to see his PCP after preop visit. He was given a new prescription (likely liraglutide, but pt did not know) and, per the pt, instructed by his PCP not to start the medication until after his surgery. Discussed with Dr. Shelle Iron and we felt that he was at risk for postop complications including hyperosmolar non-ketotic  diabetic coma if diabetes is left untreated. Furthermore, we felt it would be unwise to start a new medication in the early postoperative period as he could have a reaction to the new medication. I had a long discussion with the patient regarding the reasons for cancellation (namely postoperative complications). I also discussed starting his new medication per his PCPs recommendation and to follow up with his PCP in the interim. He expressed understanding and agreed with the plan of care.)       Anesthesia Quick Evaluation

## 2012-12-25 NOTE — Interval H&P Note (Signed)
History and Physical Interval Note:  12/25/2012 7:09 AM  Matthew Fisher  has presented today for surgery, with the diagnosis of chondromalacia and gout left knee  The various methods of treatment have been discussed with the patient and family. After consideration of risks, benefits and other options for treatment, the patient has consented to  Procedure(s): LEFT KNEE ARTHROSCOPY WITH DEBRIDEMENT/CHONDROPLASTY AND POSSIBLE LATERAL RELEASE (Left) as a surgical intervention .  The patient's history has been reviewed, patient examined, no change in status, stable for surgery.  I have reviewed the patient's chart and labs.  Questions were answered to the patient's satisfaction.     [name]

## 2013-01-13 NOTE — Progress Notes (Signed)
Surgery scheduled for 01/22/13.  Preop appointment on 01/19/13 at 0830am Need orders in EPIC.  Thanks.

## 2013-01-14 ENCOUNTER — Other Ambulatory Visit: Payer: Self-pay | Admitting: Orthopedic Surgery

## 2013-01-14 ENCOUNTER — Encounter (HOSPITAL_COMMUNITY): Payer: Self-pay | Admitting: Pharmacy Technician

## 2013-01-19 ENCOUNTER — Encounter (HOSPITAL_COMMUNITY): Payer: Self-pay

## 2013-01-19 ENCOUNTER — Other Ambulatory Visit: Payer: Self-pay | Admitting: Orthopedic Surgery

## 2013-01-19 ENCOUNTER — Encounter (HOSPITAL_COMMUNITY)
Admission: RE | Admit: 2013-01-19 | Discharge: 2013-01-19 | Disposition: A | Discharge status: Home / Self Care | Payer: BC Managed Care – PPO | Source: Ambulatory Visit | Attending: Specialist | Admitting: Specialist

## 2013-01-19 LAB — BASIC METABOLIC PANEL
Chloride: 102 mEq/L (ref 96–112)
GFR calc Af Amer: >90 mL/min (ref >90)
GFR calc non Af Amer: 81 mL/min — ABNORMAL LOW (ref >90)
Potassium: 4.1 mEq/L (ref 3.5–5.1)
Sodium: 139 mEq/L (ref 135–145)

## 2013-01-19 LAB — SURGICAL PCR SCREEN
MRSA, PCR: NEGATIVE
Staphylococcus aureus: POSITIVE — AB

## 2013-01-19 LAB — CBC
Hemoglobin: 14.9 g/dL (ref 13.0–17.0)
MCHC: 36.6 g/dL — ABNORMAL HIGH (ref 30.0–36.0)
RDW: 12.7 % (ref 11.5–15.5)
WBC: 6 10*3/uL (ref 4.0–10.5)

## 2013-01-19 NOTE — H&P (Signed)
Matthew Fisher is an 42 y.o. Male.   Chief Complaint: left knee pain   HPI: Left knee pain x 2.5 months following a specific injury (DOI 10/29/12). 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 The patient is unable to fully extend, it is popping and giving out on him and he continues to have swelling. No evidence of infection. He has had two aspirations and injections, multiple ER visits. He is unable to work. He continues to take analgesics. Known hx of gout.  Past Medical History   Diagnosis  Date   .  Diabetes mellitus without complication    .  Gout    .  Arthritis     Past Surgical History   Procedure  Laterality  Date   .  Knee surgery       "removed bone"   .  Fracture surgery       right ring finger   No family history on file.  Social History: reports that he quit smoking about 10 years ago. His smoking use included Cigarettes. He has a 10 pack-year smoking history. He has never used smokeless tobacco. He reports that drinks alcohol. He reports that he does not use illicit drugs.  Allergies: No Known Allergies  (Not in a hospital admission)  No results found for this or any previous visit (from the past 48 hour(s)).  No results found.  Review of Systems  Constitutional: Negative.  HENT: Negative. Negative for neck pain.  Eyes: Negative.  Respiratory: Negative.  Cardiovascular: Negative.  Gastrointestinal: Negative.  Genitourinary: Negative.  Musculoskeletal: Positive for joint pain. Negative for back pain.  Skin: Negative.  Neurological: Negative.  There were no vitals taken for this visit.  Physical Exam  Constitutional: He is oriented to person, place, and time. He appears well-developed and well-nourished.  HENT:  Head: Normocephalic and atraumatic.  Eyes: Pupils are equal, round, and reactive to light.  Neck: Normal range of motion. Neck supple.  Cardiovascular: Normal rate and  regular rhythm.  Respiratory: Effort normal and breath sounds normal.  GI: Soft. Bowel sounds are normal.  Musculoskeletal: He exhibits tenderness.  Left knee tender in the medial joint line, lateral joint line. Moderate effusion. Some tenderness of the patellofemoral joint. McMurray's is positive. Knee exam on inspection reveals no evidence of soft tissue swelling, ecchymosis, deformity or erythema. Nontender over the fibular head or the peroneal nerve. Nontender over the quadriceps insertion of the patellar ligament insertion. The range of motion was full. Provocative maneuvers revealed a negative Lachman's, negative anterior and posterior drawer. No instability was noted with varus and valgus stressing at zero or 30 degrees. On manual motor test, the quadriceps and hamstrings were five over five. Sensory exam was intact to light touch.   Neurological: He is alert and oriented to person, place, and time.  Skin: Skin is warm and dry.  MRI L knee demonstrates chondromalacia of the patellae and some edema and fragmentation of the ossicle of bipartite patella possible old injury, synchondrosis of the cartilage is intact.  Standing knee xrays with mild medial DJD.   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  Pt cleared by PCP Dr. Hawks given recent hyperglycemia on labwork, poorly controlled DM. Medications have been adjusted further and he will be monitored post-op for proper glycemic control by his   PCP.  Dr. Beane has previously discussed risks, complications, and alternatives with the pt including but not limited to DVT, PE, infx, bleeding, worsening stiffness or arthritic symptoms, failure of procedure, need for secondary procedure, nerve injury, RSD, anesthesia risk, even death.   

## 2013-01-19 NOTE — Progress Notes (Signed)
EKG 08/21/12 on EPIC

## 2013-01-19 NOTE — Patient Instructions (Signed)
20 Nizar Cutler  01/19/2013   Your procedure is scheduled on: 01/22/13  Report to [facility] at 0830 AM.  Call this number if you have problems the morning of surgery [phone]: [phone]   Remember:   Do not eat food or drink liquids After Midnight.     Do not wear jewelry, make-up or nail polish.  Do not wear lotions, powders, or perfumes. You may wear deodorant.  Do not shave 48 hours prior to surgery. Men may shave face and neck.  Do not bring valuables to the hospital.  Contacts, dentures or bridgework may not be worn into surgery.    Patients discharged the day of surgery will not be allowed to drive home.  Name and phone number of your driver: Cicero Duck or Onalee Hua   Please read over the following fact sheets that you were given: MRSA Information, incentive spirometer fact sheet  Birdie Sons, RN  pre op nurse call if needed [phone]    FAILURE TO FOLLOW THESE INSTRUCTIONS MAY RESULT IN CANCELLATION OF YOUR SURGERY   Patient Signature: ___________________________________________

## 2013-01-22 ENCOUNTER — Ambulatory Visit (HOSPITAL_COMMUNITY)
Admission: RE | Admit: 2013-01-22 | Discharge: 2013-01-22 | Disposition: A | Discharge status: Home / Self Care | Payer: BC Managed Care – PPO | Source: Ambulatory Visit | Attending: Specialist | Admitting: Specialist

## 2013-01-22 ENCOUNTER — Encounter (HOSPITAL_COMMUNITY)
Admission: RE | Disposition: A | Discharge status: Home / Self Care | Payer: Self-pay | Source: Ambulatory Visit | Attending: Specialist

## 2013-01-22 ENCOUNTER — Ambulatory Visit (HOSPITAL_COMMUNITY): Payer: BC Managed Care – PPO | Admitting: Anesthesiology

## 2013-01-22 ENCOUNTER — Encounter (HOSPITAL_COMMUNITY): Payer: Self-pay | Admitting: *Deleted

## 2013-01-22 ENCOUNTER — Encounter (HOSPITAL_COMMUNITY): Payer: Self-pay | Admitting: Anesthesiology

## 2013-01-22 DIAGNOSIS — Z794 Long term (current) use of insulin: Secondary | ICD-10-CM | POA: Insufficient documentation

## 2013-01-22 DIAGNOSIS — E669 Obesity, unspecified: Secondary | ICD-10-CM | POA: Insufficient documentation

## 2013-01-22 DIAGNOSIS — M109 Gout, unspecified: Secondary | ICD-10-CM | POA: Insufficient documentation

## 2013-01-22 DIAGNOSIS — X500XXA Overexertion from strenuous movement or load, initial encounter: Secondary | ICD-10-CM | POA: Insufficient documentation

## 2013-01-22 DIAGNOSIS — Y990 Civilian activity done for income or pay: Secondary | ICD-10-CM | POA: Insufficient documentation

## 2013-01-22 DIAGNOSIS — M1710 Unilateral primary osteoarthritis, unspecified knee: Secondary | ICD-10-CM | POA: Insufficient documentation

## 2013-01-22 DIAGNOSIS — E119 Type 2 diabetes mellitus without complications: Secondary | ICD-10-CM | POA: Insufficient documentation

## 2013-01-22 DIAGNOSIS — S83249A Other tear of medial meniscus, current injury, unspecified knee, initial encounter: Secondary | ICD-10-CM

## 2013-01-22 DIAGNOSIS — IMO0002 Reserved for concepts with insufficient information to code with codable children: Secondary | ICD-10-CM | POA: Insufficient documentation

## 2013-01-22 DIAGNOSIS — M2240 Chondromalacia patellae, unspecified knee: Secondary | ICD-10-CM | POA: Insufficient documentation

## 2013-01-22 HISTORY — PX: KNEE ARTHROSCOPY WITH LATERAL MENISECTOMY: SHX6193

## 2013-01-22 LAB — GLUCOSE, CAPILLARY: Glucose-Capillary: 109 mg/dL — ABNORMAL HIGH (ref 70–99)

## 2013-01-22 SURGERY — ARTHROSCOPY, KNEE, WITH LATERAL MENISCECTOMY
Anesthesia: General | Site: Knee | Laterality: Left | Wound class: Clean

## 2013-01-22 MED ORDER — OXYCODONE HCL 5 MG/5ML PO SOLN
5.0000 mg | Freq: Once | ORAL | Status: DC | PRN
  Filled 2013-01-22: qty 5

## 2013-01-22 MED ORDER — PROPOFOL 10 MG/ML IV EMUL
INTRAVENOUS | Status: DC | PRN
  Administered 2013-01-22: 160 mg via INTRAVENOUS

## 2013-01-22 MED ORDER — FENTANYL CITRATE 0.05 MG/ML IJ SOLN: 25.0000 ug | INTRAMUSCULAR | Status: DC | PRN

## 2013-01-22 MED ORDER — ACETAMINOPHEN 10 MG/ML IV SOLN: 1000.0000 mg | Freq: Once | INTRAVENOUS | Status: DC | PRN

## 2013-01-22 MED ORDER — LIDOCAINE HCL 1 % IJ SOLN
INTRAMUSCULAR | Status: DC | PRN
  Administered 2013-01-22: 60 mg via INTRADERMAL

## 2013-01-22 MED ORDER — PROMETHAZINE HCL 25 MG/ML IJ SOLN: 6.2500 mg | INTRAMUSCULAR | Status: DC | PRN

## 2013-01-22 MED ORDER — BUPIVACAINE-EPINEPHRINE 0.5% -1:200000 IJ SOLN
INTRAMUSCULAR | Status: AC
  Filled 2013-01-22: qty 1

## 2013-01-22 MED ORDER — LACTATED RINGERS IR SOLN
Status: DC | PRN
  Administered 2013-01-22 (×2): 3000 mL

## 2013-01-22 MED ORDER — EPINEPHRINE HCL 1 MG/ML IJ SOLN
INTRAMUSCULAR | Status: AC
  Filled 2013-01-22: qty 1

## 2013-01-22 MED ORDER — OXYCODONE-ACETAMINOPHEN 5-325 MG PO TABS: 1.0000 | ORAL_TABLET | ORAL | Status: DC | PRN

## 2013-01-22 MED ORDER — ACETAMINOPHEN 10 MG/ML IV SOLN
INTRAVENOUS | Status: DC | PRN
  Administered 2013-01-22: 1000 mg via INTRAVENOUS

## 2013-01-22 MED ORDER — CEFAZOLIN SODIUM-DEXTROSE 2-3 GM-% IV SOLR
INTRAVENOUS | Status: AC
  Filled 2013-01-22: qty 50

## 2013-01-22 MED ORDER — LACTATED RINGERS IV SOLN
INTRAVENOUS | Status: DC | PRN
  Administered 2013-01-22: 09:00:00 via INTRAVENOUS

## 2013-01-22 MED ORDER — FENTANYL CITRATE 0.05 MG/ML IJ SOLN
INTRAMUSCULAR | Status: DC | PRN
  Administered 2013-01-22 (×4): 50 ug via INTRAVENOUS

## 2013-01-22 MED ORDER — OXYCODONE HCL 5 MG PO TABS: 5.0000 mg | ORAL_TABLET | Freq: Once | ORAL | Status: DC | PRN

## 2013-01-22 MED ORDER — BUPIVACAINE-EPINEPHRINE 0.5% -1:200000 IJ SOLN
INTRAMUSCULAR | Status: DC | PRN
  Administered 2013-01-22: 20 mL

## 2013-01-22 MED ORDER — CEFAZOLIN SODIUM-DEXTROSE 2-3 GM-% IV SOLR
2.0000 g | INTRAVENOUS | Status: AC
  Administered 2013-01-22: 2 g via INTRAVENOUS

## 2013-01-22 MED ORDER — LACTATED RINGERS IV SOLN
INTRAVENOUS | Status: DC
  Administered 2013-01-22: 1000 mL via INTRAVENOUS

## 2013-01-22 MED ORDER — MIDAZOLAM HCL 5 MG/5ML IJ SOLN
INTRAMUSCULAR | Status: DC | PRN
  Administered 2013-01-22: 2 mg via INTRAVENOUS

## 2013-01-22 MED ORDER — MEPERIDINE HCL 50 MG/ML IJ SOLN: 6.2500 mg | INTRAMUSCULAR | Status: DC | PRN

## 2013-01-22 MED ORDER — HYDROMORPHONE HCL PF 1 MG/ML IJ SOLN: 0.2500 mg | INTRAMUSCULAR | Status: DC | PRN

## 2013-01-22 MED ORDER — EPINEPHRINE HCL 1 MG/ML IJ SOLN
INTRAMUSCULAR | Status: DC | PRN
  Administered 2013-01-22 (×2): 1 mg

## 2013-01-22 MED ORDER — ACETAMINOPHEN 10 MG/ML IV SOLN
INTRAVENOUS | Status: AC
  Filled 2013-01-22: qty 100

## 2013-01-22 SURGICAL SUPPLY — 31 items
BANDAGE ELASTIC 4 VELCRO ST LF (GAUZE/BANDAGES/DRESSINGS) ×2 IMPLANT
BLADE 4.2CUDA (BLADE) IMPLANT
BLADE CUDA SHAVER 3.5 (BLADE) ×4 IMPLANT
CANNULA ACUFO 5X76 (CANNULA) ×2 IMPLANT
CLOTH BEACON ORANGE TIMEOUT ST (SAFETY) ×2 IMPLANT
DRSG EMULSION OIL 3X3 NADH (GAUZE/BANDAGES/DRESSINGS) ×2 IMPLANT
DRSG PAD ABDOMINAL 8X10 ST (GAUZE/BANDAGES/DRESSINGS) ×2 IMPLANT
DURAPREP 26ML APPLICATOR (WOUND CARE) ×2 IMPLANT
ELECT KIT MENISCUS BASIC 165 (SET/KITS/TRAYS/PACK) ×2 IMPLANT
FILTER STRAW (MISCELLANEOUS) ×2 IMPLANT
GLOVE BIOGEL PI IND STRL 7.5 (GLOVE) ×1 IMPLANT
GLOVE BIOGEL PI IND STRL 8 (GLOVE) ×1 IMPLANT
GLOVE BIOGEL PI INDICATOR 7.5 (GLOVE) ×1
GLOVE BIOGEL PI INDICATOR 8 (GLOVE) ×1
GLOVE SURG SS PI 7.5 STRL IVOR (GLOVE) ×2 IMPLANT
GLOVE SURG SS PI 8.0 STRL IVOR (GLOVE) ×4 IMPLANT
GOWN PREVENTION PLUS LG XLONG (DISPOSABLE) ×2 IMPLANT
GOWN STRL REIN XL XLG (GOWN DISPOSABLE) ×4 IMPLANT
MANIFOLD NEPTUNE II (INSTRUMENTS) ×4 IMPLANT
PACK ARTHROSCOPY WL (CUSTOM PROCEDURE TRAY) ×2 IMPLANT
PACK ICE MAXI GEL EZY WRAP (MISCELLANEOUS) ×2 IMPLANT
PAD CAST 4YDX4 CTTN HI CHSV (CAST SUPPLIES) ×1 IMPLANT
PADDING CAST COTTON 4X4 STRL (CAST SUPPLIES) ×1
PADDING CAST COTTON 6X4 STRL (CAST SUPPLIES) ×2 IMPLANT
SET ARTHROSCOPY TUBING (MISCELLANEOUS) ×1
SET ARTHROSCOPY TUBING LN (MISCELLANEOUS) ×1 IMPLANT
SPONGE GAUZE 4X4 12PLY (GAUZE/BANDAGES/DRESSINGS) ×2 IMPLANT
SUT ETHILON 4 0 PS 2 18 (SUTURE) ×4 IMPLANT
SYR 20CC LL (SYRINGE) ×2 IMPLANT
WAND 90 DEG TURBOVAC W/CORD (SURGICAL WAND) ×2 IMPLANT
WRAP KNEE MAXI GEL POST OP (GAUZE/BANDAGES/DRESSINGS) ×2 IMPLANT

## 2013-01-22 NOTE — H&P (View-Only) (Signed)
Matthew Fisher is an 42 y.o. Male.   Chief Complaint: left knee pain   HPI: Left knee pain x 2.5 months following a specific injury (DOI 10/29/12). 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 The patient is unable to fully extend, it is popping and giving out on him and he continues to have swelling. No evidence of infection. He has had two aspirations and injections, multiple ER visits. He is unable to work. He continues to take analgesics. Known hx of gout.  Past Medical History   Diagnosis  Date   .  Diabetes mellitus without complication    .  Gout    .  Arthritis     Past Surgical History   Procedure  Laterality  Date   .  Knee surgery       "removed bone"   .  Fracture surgery       right ring finger   No family history on file.  Social History: reports that he quit smoking about 10 years ago. His smoking use included Cigarettes. He has a 10 pack-year smoking history. He has never used smokeless tobacco. He reports that drinks alcohol. He reports that he does not use illicit drugs.  Allergies: No Known Allergies  (Not in a hospital admission)  No results found for this or any previous visit (from the past 48 hour(s)).  No results found.  Review of Systems  Constitutional: Negative.  HENT: Negative. Negative for neck pain.  Eyes: Negative.  Respiratory: Negative.  Cardiovascular: Negative.  Gastrointestinal: Negative.  Genitourinary: Negative.  Musculoskeletal: Positive for joint pain. Negative for back pain.  Skin: Negative.  Neurological: Negative.  There were no vitals taken for this visit.  Physical Exam  Constitutional: He is oriented to person, place, and time. He appears well-developed and well-nourished.  HENT:  Head: Normocephalic and atraumatic.  Eyes: Pupils are equal, round, and reactive to light.  Neck: Normal range of motion. Neck supple.  Cardiovascular: Normal rate and  regular rhythm.  Respiratory: Effort normal and breath sounds normal.  GI: Soft. Bowel sounds are normal.  Musculoskeletal: He exhibits tenderness.  Left knee tender in the medial joint line, lateral joint line. Moderate effusion. Some tenderness of the patellofemoral joint. McMurray's is positive. Knee exam on inspection reveals no evidence of soft tissue swelling, ecchymosis, deformity or erythema. Nontender over the fibular head or the peroneal nerve. Nontender over the quadriceps insertion of the patellar ligament insertion. The range of motion was full. Provocative maneuvers revealed a negative Lachman's, negative anterior and posterior drawer. No instability was noted with varus and valgus stressing at zero or 30 degrees. On manual motor test, the quadriceps and hamstrings were five over five. Sensory exam was intact to light touch.   Neurological: He is alert and oriented to person, place, and time.  Skin: Skin is warm and dry.  MRI L knee demonstrates chondromalacia of the patellae and some edema and fragmentation of the ossicle of bipartite patella possible old injury, synchondrosis of the cartilage is intact.  Standing knee xrays with mild medial DJD.   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  Pt cleared by PCP Dr. Lendon Colonel given recent hyperglycemia on labwork, poorly controlled DM. Medications have been adjusted further and he will be monitored post-op for proper glycemic control by his  PCP.  Dr. Shelle Iron has previously discussed risks, complications, and alternatives with the pt including but not limited to DVT, PE, infx, bleeding, worsening stiffness or arthritic symptoms, failure of procedure, need for secondary procedure, nerve injury, RSD, anesthesia risk, even death.

## 2013-01-22 NOTE — Interval H&P Note (Signed)
History and Physical Interval Note:  01/22/2013 10:04 AM  Matthew Fisher  has presented today for surgery, with the diagnosis of chondromalacia and gout left knee   The various methods of treatment have been discussed with the patient and family. After consideration of risks, benefits and other options for treatment, the patient has consented to  Procedure(s): LEFT KNEE ARTHROSCOPY DEBRIDEMENT CHONDROPLASTY AND POSSIBLE LATERAL RELEASE (Left) as a surgical intervention .  The patient's history has been reviewed, patient examined, no change in status, stable for surgery.  I have reviewed the patient's chart and labs.  Questions were answered to the patient's satisfaction.     [name] C

## 2013-01-22 NOTE — Anesthesia Procedure Notes (Addendum)
Performed by: Thornell Mule   Procedure Name: LMA Insertion Date/Time: 01/22/2013 10:39 AM Performed by: Thornell Mule Pre-anesthesia Checklist: Patient identified, Timeout performed, Emergency Drugs available, Suction available and Patient being monitored Patient Re-evaluated:Patient Re-evaluated prior to inductionOxygen Delivery Method: Circle system utilized Preoxygenation: Pre-oxygenation with 100% oxygen Intubation Type: IV induction Ventilation: Mask ventilation without difficulty LMA: LMA inserted LMA Size: 4.0 Number of attempts: 1 Placement Confirmation: ETT inserted through vocal cords under direct vision,  breath sounds checked- equal and bilateral and positive ETCO2 (no trauma during LMA placement)

## 2013-01-22 NOTE — Anesthesia Postprocedure Evaluation (Signed)
  Anesthesia Post-op Note  Patient: Matthew Fisher  Procedure(s) Performed: Procedure(s) (LRB): LEFT KNEE ARTHROSCOPY DEBRIDEMENT CHONDROPLASTY, LATERAL RELEASE AND partial medial meniscectomy (Left)  Patient Location: PACU  Anesthesia Type: General  Level of Consciousness: awake and alert   Airway and Oxygen Therapy: Patient Spontanous Breathing  Post-op Pain: mild  Post-op Assessment: Post-op Vital signs reviewed, Patient's Cardiovascular Status Stable, Respiratory Function Stable, Patent Airway and No signs of Nausea or vomiting  Last Vitals:  Filed Vitals:   01/22/13 1215  BP: 126/90  Pulse: 82  Temp:   Resp: 12    Post-op Vital Signs: stable   Complications: No apparent anesthesia complications

## 2013-01-22 NOTE — Brief Op Note (Signed)
01/22/2013  11:31 AM  PATIENT:  Matthew Fisher  42 y.o. male  PRE-OPERATIVE DIAGNOSIS:  chondromalacia and gout left knee   POST-OPERATIVE DIAGNOSIS:  chondromalacia and gout left knee   PROCEDURE:  Procedure(s): LEFT KNEE ARTHROSCOPY DEBRIDEMENT CHONDROPLASTY, LATERAL RELEASE AND partial medial meniscectomy (Left)  SURGEON:  Surgeon(s) and Role:    * Javier Docker, MD - Primary  PHYSICIAN ASSISTANT:   ASSISTANTS: Bissell   ANESTHESIA:   general  EBL:     BLOOD ADMINISTERED:none  DRAINS: none   LOCAL MEDICATIONS USED:  MARCAINE     SPECIMEN:  No Specimen  DISPOSITION OF SPECIMEN:  N/A  COUNTS:  YES  TOURNIQUET:  * No tourniquets in log *  DICTATION: .Other Dictation: Dictation Number [id number]  PLAN OF CARE: Discharge to home after PACU  PATIENT DISPOSITION:  PACU - hemodynamically stable.   Delay start of Pharmacological VTE agent (>24hrs) due to surgical blood loss or risk of bleeding: no

## 2013-01-22 NOTE — Anesthesia Preprocedure Evaluation (Addendum)
Anesthesia Evaluation  Patient identified by MRN, date of birth, ID band Patient awake    Reviewed: Allergy & Precautions, H&P , NPO status , Patient's Chart, lab work & pertinent test results, reviewed documented beta blocker date and time   Airway Mallampati: II TM Distance: >3 FB Neck ROM: full    Dental  (+) Dental Advisory Given Recently loose right lower incisor.:   Pulmonary neg pulmonary ROS,  breath sounds clear to auscultation  Pulmonary exam normal       Cardiovascular Exercise Tolerance: Good negative cardio ROS  Rhythm:regular Rate:Normal     Neuro/Psych negative neurological ROS  negative psych ROS   GI/Hepatic negative GI ROS, Neg liver ROS,   Endo/Other  negative endocrine ROSdiabetes, Type 1, Insulin Dependent  Renal/GU negative Renal ROS  negative genitourinary   Musculoskeletal   Abdominal (+) + obese,   Peds  Hematology negative hematology ROS (+)   Anesthesia Other Findings   Reproductive/Obstetrics negative OB ROS                          Anesthesia Physical Anesthesia Plan  ASA: III  Anesthesia Plan: General   Post-op Pain Management:    Induction: Intravenous  Airway Management Planned: LMA  Additional Equipment:   Intra-op Plan:   Post-operative Plan: Extubation in OR  Informed Consent: I have reviewed the patients History and Physical, chart, labs and discussed the procedure including the risks, benefits and alternatives for the proposed anesthesia with the patient or authorized representative who has indicated his/her understanding and acceptance.   Dental Advisory Given  Plan Discussed with: CRNA  Anesthesia Plan Comments: (This procedure was cancelled on 12-25-12 for blood sugar greater than 300. Today sugar better controlled at 109)       Anesthesia Quick Evaluation

## 2013-01-22 NOTE — Transfer of Care (Signed)
Immediate Anesthesia Transfer of Care Note  Patient: Matthew Fisher  Procedure(s) Performed: Procedure(s): LEFT KNEE ARTHROSCOPY DEBRIDEMENT CHONDROPLASTY, LATERAL RELEASE AND partial medial meniscectomy (Left)  Patient Location: PACU  Anesthesia Type:General  Level of Consciousness: sedated, patient cooperative and responds to stimulation  Airway & Oxygen Therapy: Patient Spontanous Breathing and Patient connected to face mask oxygen  Post-op Assessment: Report given to PACU RN and Post -op Vital signs reviewed and stable  Post vital signs: Reviewed and stable  Complications: No apparent anesthesia complications

## 2013-01-23 ENCOUNTER — Encounter (HOSPITAL_COMMUNITY): Payer: Self-pay | Admitting: Specialist

## 2013-01-23 NOTE — Op Note (Signed)
NAMECARMICHAEL, Matthew Fisher             ACCOUNT NO.:  [id number]  MEDICAL RECORD NO.:  [id number]  LOCATION:  [facility]                         FACILITY:  [facility]  PHYSICIAN:  Jene Every, M.D.    DATE OF BIRTH:  06-20-1971  DATE OF PROCEDURE:  01/22/2013 DATE OF DISCHARGE:  01/22/2013                              OPERATIVE REPORT   PREOPERATIVE DIAGNOSIS:  Medial meniscus tear, gouty arthropathy, patellofemoral arthrosis, lateral tracking patella.  POSTOPERATIVE DIAGNOSIS:  Medial meniscus tear, gouty arthropathy, patellofemoral arthrosis, lateral tracking patella.  PROCEDURES PERFORMED: 1. Left knee arthroscopy. 2. Partial medial meniscectomy. 3. Extensive synovectomy for debridement of gouty arthropathy. 4. Removal of loose bodies. 5. Lateral retinacular release.  ANESTHESIA:  General.  ASSISTANT:  Lanna Poche, PA was utilized for holding knee and providing a varus stress to open the lateral compartment and to manipulate the arthroscopic portals and securing them to facilitate lateral release.  BRIEF HISTORY:  This is a 42 year old with gouty arthropathy, locking, popping, and giving way.  Elected conservative treatment.  MRI indicating meniscus tear, lateral tracking patella, refractory conservative treatment including rest, activity, modification, corticosteroid injection, __________ stability, locking, popping, giving way, was indicated for arthroscopic debridement, lateral release, evaluation of menisci.  Risk and benefits discussed including bleeding, infection, damage to neurovascular, DVT, PE, and anesthetic complications, etc.  TECHNIQUE:  With the patient in supine position, after induction of adequate general anesthesia, 2 g Kefzol, left lower extremity was prepped and draped in usual sterile fashion.  A lateral parapatellar portal was fashioned with a #11 blade.  Ingress cannula atraumatically placed.  Irrigant was utilized to insufflate the joint.  Under  direct visualization, a medial parapatellar portal was fashioned with a #11 blade after localization with 18-gauge needle sparing the medial meniscus.  Noted was extensive gouty arthropathy, cartilaginous bodies noted within the compartment __________ on the femoral condyle and the meniscus.  There was a tear in the anterior 3rd of the meniscus.  This was debrided to a stable base.  We lightly debrided the meniscus and chondral surfaces and evacuated the loose cartilaginous debris.  ACL was unremarkable.  Lateral compartment revealed a sense of gouty arthropathy and foramen in the medial wedge of the lateral meniscus, I trimmed this with a straight basket.  Debrided lateral compartment. Removal of loose cartilaginous debris __________.  We performed chondroplasty of the femoral condyle and tibial plateau.  Again extensive infiltration and gouty arthropathy was noted.  Suprapatellar pouch had some grade 3 and grade 4 changes, lateral tracking patella. We used a lateral portal and performed a lateral release with a Bovie releasing the retinaculum only.  There did appear to be lateral tracking of the patella, predisposing lateral impingement.  There was a loose body here that was treated with pituitary.  We performed debridement of the synovium and the loose cartilaginous debris and the gouty arthropathy and suprapatellar pouch, an additional medial compartment and lateral compartment.  Copiously lavaged all compartments. Revisited, but no further pathology amenable arthroscopic intervention with improved tracking of the patella following the release.  We decreased the pressure and there was no significant intra-articular bleeding following that.  After copious lavage, all instrumentation was removed.  Portals were  closed with 4-0 nylon simple sutures.  A 0.25% Marcaine with epinephrine was infiltrated in the joint and wound was dressed sterilely.  Awoken without difficulty and transported  to recovery in satisfactory condition.  The patient tolerated the procedure well.  No complications.  Minimal blood loss.     Jene Every, M.D.     [name]  D:  01/22/2013  T:  01/22/2013  Job:  [id number]

## 2014-10-21 ENCOUNTER — Encounter (HOSPITAL_COMMUNITY): Payer: Self-pay | Admitting: Specialist

## 2017-10-12 ENCOUNTER — Encounter (HOSPITAL_BASED_OUTPATIENT_CLINIC_OR_DEPARTMENT_OTHER): Payer: Self-pay | Admitting: Emergency Medicine

## 2017-10-12 ENCOUNTER — Emergency Department (HOSPITAL_BASED_OUTPATIENT_CLINIC_OR_DEPARTMENT_OTHER)
Admission: EM | Admit: 2017-10-12 | Discharge: 2017-10-12 | Disposition: A | Discharge status: Home / Self Care | Payer: Self-pay | Attending: Emergency Medicine | Admitting: Emergency Medicine

## 2017-10-12 ENCOUNTER — Emergency Department (HOSPITAL_BASED_OUTPATIENT_CLINIC_OR_DEPARTMENT_OTHER): Payer: Self-pay

## 2017-10-12 ENCOUNTER — Other Ambulatory Visit: Payer: Self-pay

## 2017-10-12 DIAGNOSIS — Z79899 Other long term (current) drug therapy: Secondary | ICD-10-CM | POA: Insufficient documentation

## 2017-10-12 DIAGNOSIS — Z87891 Personal history of nicotine dependence: Secondary | ICD-10-CM | POA: Insufficient documentation

## 2017-10-12 DIAGNOSIS — R112 Nausea with vomiting, unspecified: Secondary | ICD-10-CM | POA: Insufficient documentation

## 2017-10-12 DIAGNOSIS — R197 Diarrhea, unspecified: Secondary | ICD-10-CM | POA: Insufficient documentation

## 2017-10-12 DIAGNOSIS — Z794 Long term (current) use of insulin: Secondary | ICD-10-CM | POA: Insufficient documentation

## 2017-10-12 DIAGNOSIS — E119 Type 2 diabetes mellitus without complications: Secondary | ICD-10-CM | POA: Insufficient documentation

## 2017-10-12 DIAGNOSIS — I10 Essential (primary) hypertension: Secondary | ICD-10-CM | POA: Insufficient documentation

## 2017-10-12 DIAGNOSIS — R1013 Epigastric pain: Secondary | ICD-10-CM | POA: Insufficient documentation

## 2017-10-12 HISTORY — DX: Essential (primary) hypertension: I10

## 2017-10-12 LAB — COMPREHENSIVE METABOLIC PANEL
ALT: 23 U/L (ref 17–63)
ANION GAP: 9 (ref 5–15)
AST: 31 U/L (ref 15–41)
Albumin: 3.8 g/dL (ref 3.5–5.0)
Alkaline Phosphatase: 80 U/L (ref 38–126)
BUN: 21 mg/dL — ABNORMAL HIGH (ref 6–20)
CHLORIDE: 96 mmol/L — AB (ref 101–111)
CO2: 24 mmol/L (ref 22–32)
CREATININE: 1.27 mg/dL — AB (ref 0.61–1.24)
Calcium: 8.7 mg/dL — ABNORMAL LOW (ref 8.9–10.3)
Glucose, Bld: 292 mg/dL — ABNORMAL HIGH (ref 65–99)
Potassium: 3.1 mmol/L — ABNORMAL LOW (ref 3.5–5.1)
Sodium: 129 mmol/L — ABNORMAL LOW (ref 135–145)
Total Bilirubin: 1.2 mg/dL (ref 0.3–1.2)
Total Protein: 7 g/dL (ref 6.5–8.1)

## 2017-10-12 LAB — CBC
HCT: 37.7 % — ABNORMAL LOW (ref 39.0–52.0)
Hemoglobin: 14.3 g/dL (ref 13.0–17.0)
MCH: 31.6 pg (ref 26.0–34.0)
MCHC: 37.9 g/dL — ABNORMAL HIGH (ref 30.0–36.0)
MCV: 83.2 fL (ref 78.0–100.0)
PLATELETS: 201 10*3/uL (ref 150–400)
RBC: 4.53 MIL/uL (ref 4.22–5.81)
RDW: 11.7 % (ref 11.5–15.5)
WBC: 7.1 10*3/uL (ref 4.0–10.5)

## 2017-10-12 LAB — URINALYSIS, ROUTINE W REFLEX MICROSCOPIC
Bilirubin Urine: NEGATIVE
Glucose, UA: >=500 mg/dL — AB
Ketones, ur: NEGATIVE mg/dL
Leukocytes, UA: NEGATIVE
Nitrite: NEGATIVE
PROTEIN: 30 mg/dL — AB
Specific Gravity, Urine: 1.02 (ref 1.005–1.030)
pH: 6 (ref 5.0–8.0)

## 2017-10-12 LAB — CBG MONITORING, ED: Glucose-Capillary: 281 mg/dL — ABNORMAL HIGH (ref 65–99)

## 2017-10-12 LAB — URINALYSIS, MICROSCOPIC (REFLEX): Squamous Epithelial / LPF: NONE SEEN

## 2017-10-12 LAB — LIPASE, BLOOD: LIPASE: 19 U/L (ref 11–51)

## 2017-10-12 IMAGING — CR DG ABDOMEN ACUTE W/ 1V CHEST
3 series · 3 of 3 positions shown · non-contrast
Comparison: Prior chest x-ray [DATE]

CLINICAL DATA: 46-year-old male with vomiting, diarrhea, weakness
and fatigue

EXAM:
DG ABDOMEN ACUTE W/ 1V CHEST

[w chest pa]
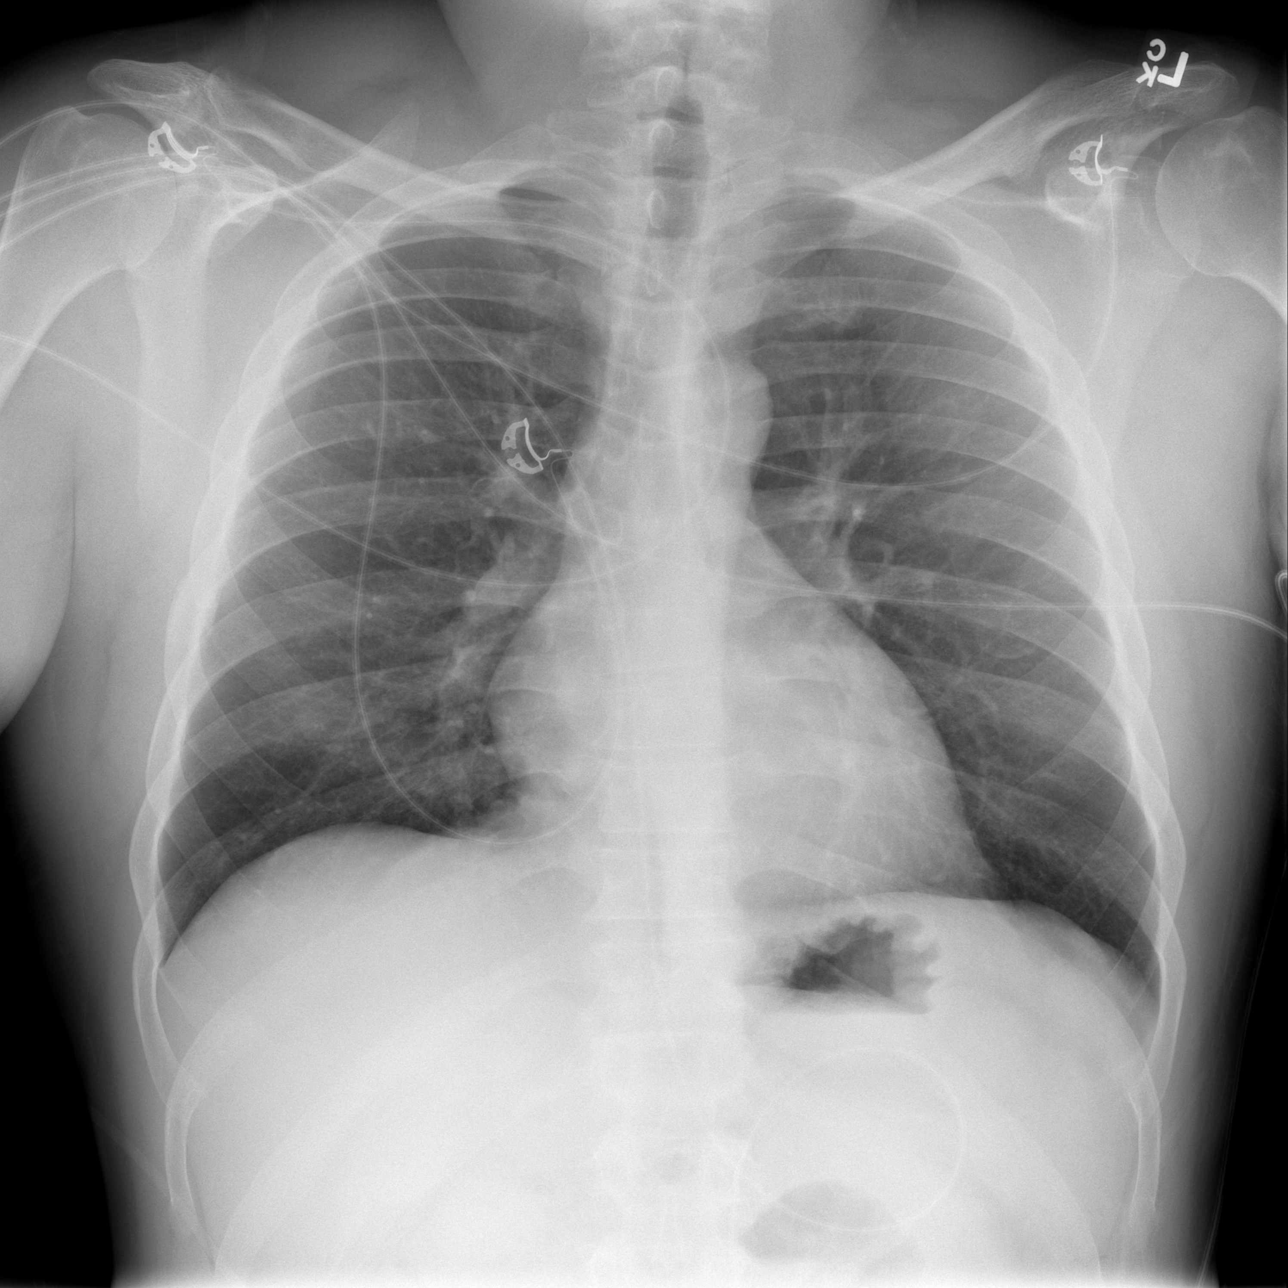

[w abdomen upright]
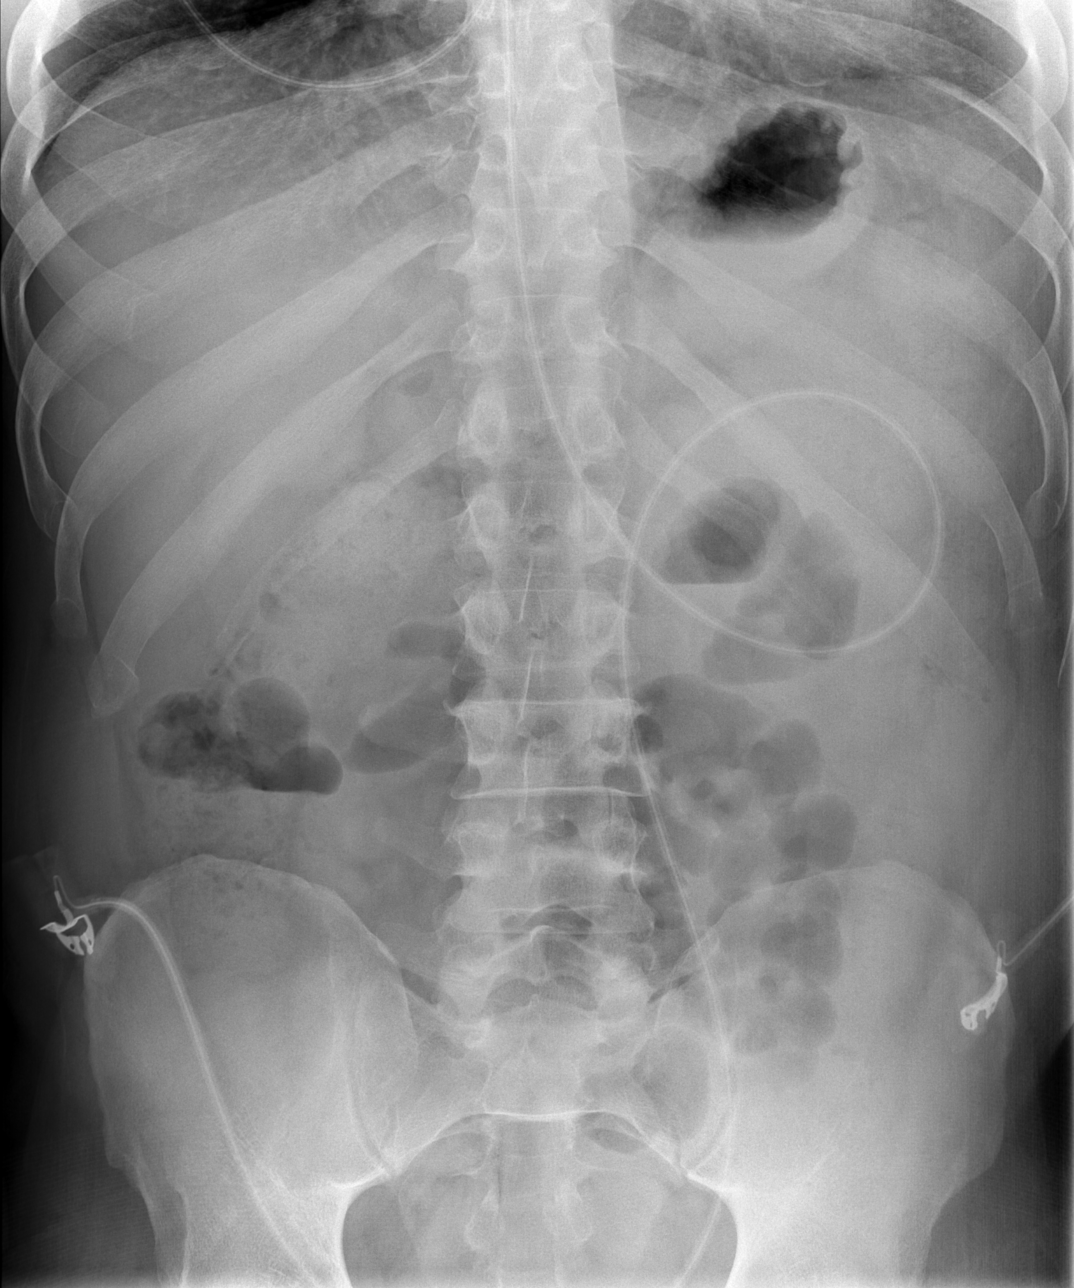

[t abdomen supine]
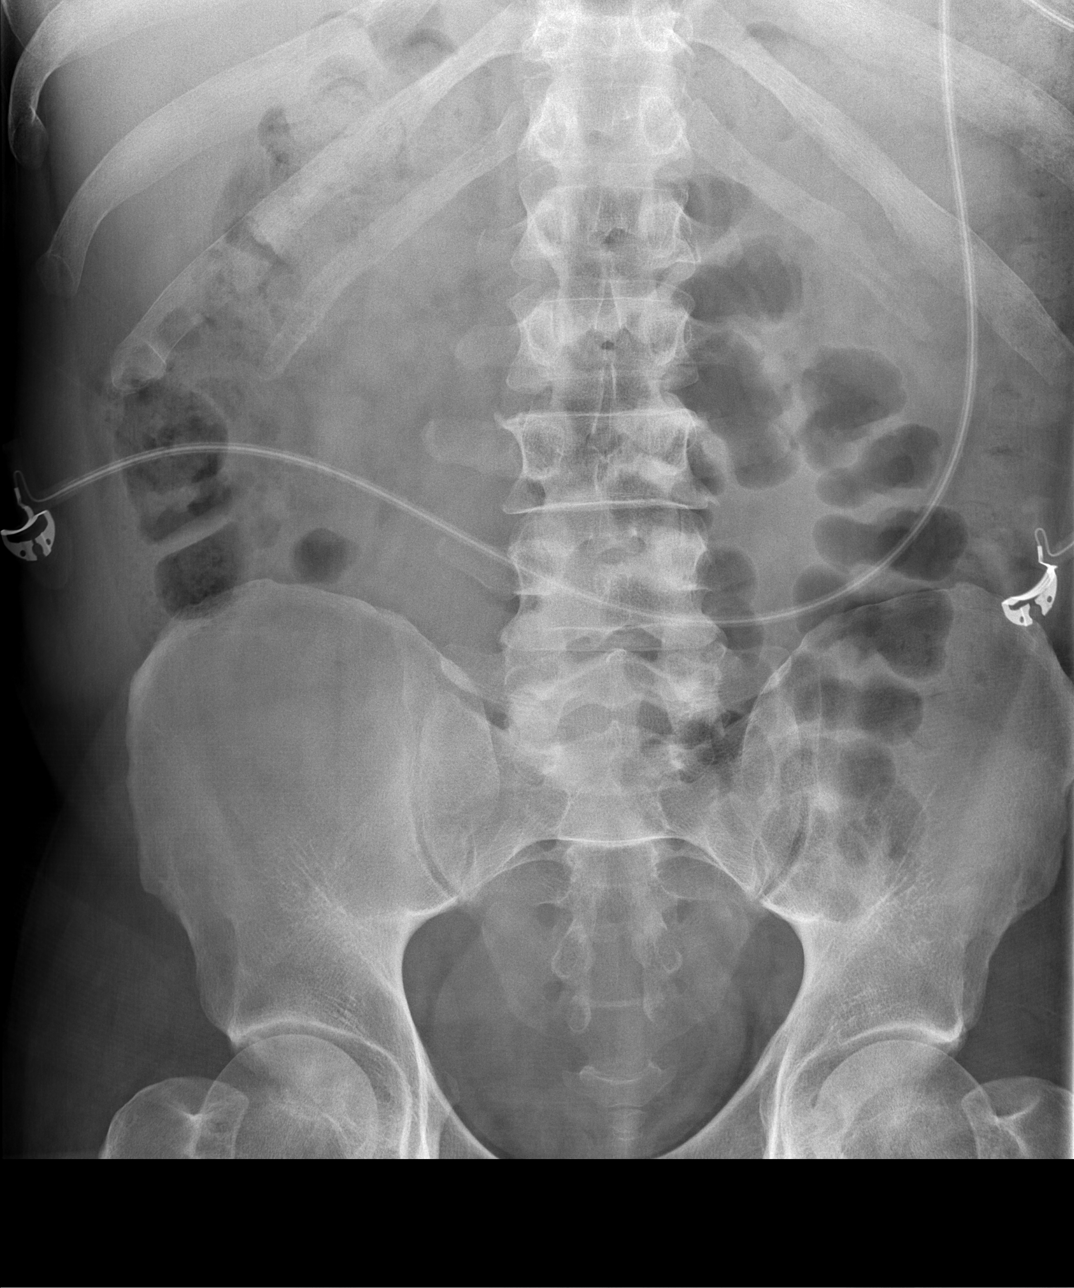

[3 of 3 positions shown; findings below may reference images not displayed]

FINDINGS: Cardiac and mediastinal contours are within normal limits. The lungs
are clear. No pneumothorax or pleural effusion.

No evidence of free air. Gas is present within a few loops of
nondistended bowel in the left mid abdomen. Gas is otherwise present
throughout the colon. No abnormal mass effect or calcifications.
Osseous structures are intact and unremarkable.
IMPRESSION: 1. Normal chest x-ray.
2. Nonspecific bowel gas pattern with gas in a few loops of
borderline distended small bowel in the left mid abdomen.
Differential considerations include localized reactive ileus versus
very early or partial small bowel obstruction.

## 2017-10-12 MED ORDER — ONDANSETRON HCL 4 MG/2ML IJ SOLN
4.00 | INTRAMUSCULAR | Status: DC
Start: ? — End: 2017-10-12

## 2017-10-12 MED ORDER — PROMETHAZINE HCL 25 MG RE SUPP: 25.0000 mg | Freq: Four times a day (QID) | RECTAL | 0 refills | Status: DC | PRN

## 2017-10-12 MED ORDER — METOCLOPRAMIDE HCL 10 MG PO TABS
10.00 | ORAL_TABLET | ORAL | Status: DC
Start: 2017-10-10 — End: 2017-10-12

## 2017-10-12 MED ORDER — GLUCOSE 40 % PO GEL
15.00 g | ORAL | Status: DC
Start: ? — End: 2017-10-12

## 2017-10-12 MED ORDER — GI COCKTAIL ~~LOC~~
30.0000 mL | Freq: Once | ORAL | Status: AC
  Administered 2017-10-12: 30 mL via ORAL
  Filled 2017-10-12: qty 30

## 2017-10-12 MED ORDER — GENERIC EXTERNAL MEDICATION
20.00 | Status: DC
Start: 2017-10-11 — End: 2017-10-12

## 2017-10-12 MED ORDER — HYDROMORPHONE HCL 1 MG/ML IJ SOLN
0.5000 mg | Freq: Once | INTRAMUSCULAR | Status: AC
  Administered 2017-10-12: 0.5 mg via INTRAVENOUS
  Filled 2017-10-12: qty 1

## 2017-10-12 MED ORDER — GENERIC EXTERNAL MEDICATION
300.00 | Status: DC
Start: 2017-10-10 — End: 2017-10-12

## 2017-10-12 MED ORDER — INSULIN LISPRO 100 UNIT/ML ~~LOC~~ SOLN
2.00 | SUBCUTANEOUS | Status: DC
Start: 2017-10-10 — End: 2017-10-12

## 2017-10-12 MED ORDER — ONDANSETRON HCL 4 MG/2ML IJ SOLN
4.0000 mg | Freq: Once | INTRAMUSCULAR | Status: AC
Start: 2017-10-12 — End: 2017-10-12
  Administered 2017-10-12: 4 mg via INTRAVENOUS
  Filled 2017-10-12: qty 2

## 2017-10-12 MED ORDER — HYDRALAZINE HCL 20 MG/ML IJ SOLN
10.00 | INTRAMUSCULAR | Status: DC
Start: ? — End: 2017-10-12

## 2017-10-12 MED ORDER — DEXTROSE 50 % IV SOLN
12.00 g | INTRAVENOUS | Status: DC
Start: ? — End: 2017-10-12

## 2017-10-12 MED ORDER — HYDRALAZINE HCL 50 MG PO TABS
50.00 | ORAL_TABLET | ORAL | Status: DC
Start: 2017-10-10 — End: 2017-10-12

## 2017-10-12 MED ORDER — ACETAMINOPHEN 325 MG PO TABS
650.00 | ORAL_TABLET | ORAL | Status: DC
Start: ? — End: 2017-10-12

## 2017-10-12 MED ORDER — SUCRALFATE 1 GM/10ML PO SUSP
1.00 g | ORAL | Status: DC
Start: 2017-10-10 — End: 2017-10-12

## 2017-10-12 MED ORDER — GENERIC EXTERNAL MEDICATION
1.00 | Status: DC
Start: ? — End: 2017-10-12

## 2017-10-12 MED ORDER — FAMOTIDINE IN NACL 20-0.9 MG/50ML-% IV SOLN
20.0000 mg | Freq: Once | INTRAVENOUS | Status: AC
  Administered 2017-10-12: 20 mg via INTRAVENOUS
  Filled 2017-10-12: qty 50

## 2017-10-12 MED ORDER — METOCLOPRAMIDE HCL 10 MG PO TABS: 10.0000 mg | ORAL_TABLET | Freq: Four times a day (QID) | ORAL | 0 refills | Status: DC

## 2017-10-12 MED ORDER — PANTOPRAZOLE SODIUM 40 MG PO TBEC
40.00 | DELAYED_RELEASE_TABLET | ORAL | Status: DC
Start: 2017-10-11 — End: 2017-10-12

## 2017-10-12 MED ORDER — AMLODIPINE BESYLATE 5 MG PO TABS
10.00 | ORAL_TABLET | ORAL | Status: DC
Start: 2017-10-11 — End: 2017-10-12

## 2017-10-12 MED ORDER — LACTATED RINGERS IV BOLUS (SEPSIS)
1000.0000 mL | Freq: Once | INTRAVENOUS | Status: AC
  Administered 2017-10-12: 1000 mL via INTRAVENOUS

## 2017-10-12 MED ORDER — HYDROCODONE-ACETAMINOPHEN 5-325 MG PO TABS
1.00 | ORAL_TABLET | ORAL | Status: DC
Start: ? — End: 2017-10-12

## 2017-10-12 MED ORDER — HEPARIN SODIUM (PORCINE) 5000 UNIT/ML IJ SOLN
5000.00 | INTRAMUSCULAR | Status: DC
Start: 2017-10-10 — End: 2017-10-12

## 2017-10-12 NOTE — ED Provider Notes (Signed)
[facility] EMERGENCY DEPARTMENT Provider Note   CSN: [id number] Arrival date & time: 10/12/17  1019     History   Chief Complaint Chief Complaint  Patient presents with  . Emesis    HPI Matthew Fisher is a 47 y.o. male.  HPI 47 year old African-American male past medical history significant for hypertension, diabetes presents to the emergency department today for evaluation of emesis and epigastric abdominal pain.  Patient was admitted to the hospital on 12/31 at [facility] for diarrhea, vomiting, abdominal pain and dehydration.  At that time patient was found to have an AK I.  Patient also had a CT scan that was unremarkable for any acute findings.  He also had an EGD that was performed by gastro that showed grade D esophagitis and esophageal strictures status post dilation on Protonix and Reglan.  Patient states that he left the hospital on Thursday and felt okay.  He says that he still has not had a bowel movement in the past week however he does report passing gas.  Patient reports that vomiting started again yesterday.  He states that every time he takes his medicine he vomits.  Patient also reports epigastric abdominal pain that radiates to his back.  He states the pain is constant and cramping in nature.  Nothing makes better or worse.  He has not taken them for the pain prior to arrival.  Patient was discharged with Carafate and PPI.  Patient was not given any antinausea medications.  Patient's was also started on insulin as his diabetes was uncontrolled.  She has follow-up with primary care doctor next week.  Pt denies any fever, chill, ha, vision changes, lightheadedness, dizziness, congestion, neck pain, cp, sob, cough, urinary symptoms, melena, hematochezia, lower extremity paresthesias.    Past Medical History:  Diagnosis Date  . Arthritis   . Diabetes mellitus without complication (HCC)   . Gout   . Hypertension     Patient Active Problem List   Diagnosis Date Noted  . Medial meniscus tear 01/22/2013    Past Surgical History:  Procedure Laterality Date  . FRACTURE SURGERY     right ring finger  . KNEE ARTHROSCOPY WITH LATERAL MENISECTOMY Left 01/22/2013   Procedure: LEFT KNEE ARTHROSCOPY DEBRIDEMENT CHONDROPLASTY, LATERAL RELEASE AND partial medial meniscectomy;  Surgeon: Javier Docker, MD;  Location: [facility];  Service: Orthopedics;  Laterality: Left;  . KNEE SURGERY     "removed bone"       Home Medications    Prior to Admission medications   Medication Sig Start Date End Date Taking? Authorizing Provider  lisinopril (PRINIVIL,ZESTRIL) 10 MG tablet Take 10 mg by mouth daily.   Yes [provider]  indomethacin (INDOCIN) 50 MG capsule Take 50 mg by mouth 3 (three) times a week.     [provider]  insulin detemir (LEVEMIR) 100 UNIT/ML injection Inject 20 Units into the skin daily.    [provider]  metoCLOPramide (REGLAN) 10 MG tablet Take 1 tablet (10 mg total) by mouth every 6 (six) hours. 10/12/17   Rise Mu, PA-C  oxyCODONE-acetaminophen (PERCOCET) 5-325 MG per tablet Take 1-2 tablets by mouth every 4 (four) hours as needed for pain. 01/22/13   Jene Every, MD  oxyCODONE-acetaminophen (PERCOCET/ROXICET) 5-325 MG per tablet Take 1 tablet by mouth every 4 (four) hours as needed for pain.    [provider]  promethazine (PHENERGAN) 25 MG suppository Place 1 suppository (25 mg total) rectally every 6 (six)  hours as needed for nausea or vomiting. 10/12/17   Rise Mu, PA-C    Family History No family history on file.  Social History Social History   Tobacco Use  . Smoking status: Former Smoker    Packs/day: 0.50    Years: 20.00    Pack years: 10.00    Types: Cigarettes    Last attempt to quit: 10/08/2002    Years since quitting: 15.0  . Smokeless tobacco: Never Used  Substance Use Topics  . Alcohol use: Yes    Comment: occasional  . Drug use: No      Allergies   Patient has no known allergies.   Review of Systems Review of Systems  Constitutional: Negative for chills and fever.  HENT: Negative for congestion and sore throat.   Eyes: Negative for visual disturbance.  Respiratory: Negative for cough and shortness of breath.   Cardiovascular: Negative for chest pain.  Gastrointestinal: Positive for abdominal pain, nausea and vomiting. Negative for blood in stool, constipation and diarrhea.  Genitourinary: Negative for dysuria, flank pain, frequency, hematuria, scrotal swelling, testicular pain and urgency.  Musculoskeletal: Negative for arthralgias and myalgias.  Skin: Negative for rash.  Neurological: Negative for dizziness, syncope, weakness, light-headedness, numbness and headaches.  Psychiatric/Behavioral: Negative for sleep disturbance. The patient is not nervous/anxious.      Physical Exam Updated Vital Signs BP 134/64 (BP Location: Right Arm)   Pulse 73   Temp 98.8 F (37.1 C) (Oral)   Resp 18   Ht 5\' 8"  (1.727 m)   Wt 93.9 kg (207 lb)   SpO2 100%   BMI 31.47 kg/m   Physical Exam  Constitutional: He is oriented to person, place, and time. He appears well-developed and well-nourished.  Non-toxic appearance. No distress.  HENT:  Head: Normocephalic and atraumatic.  Mouth/Throat: Oropharynx is clear and moist.  Eyes: Conjunctivae are normal. Pupils are equal, round, and reactive to light. Right eye exhibits no discharge. Left eye exhibits no discharge.  Neck: Normal range of motion. Neck supple.  Cardiovascular: Normal rate, regular rhythm, normal heart sounds and intact distal pulses. Exam reveals no gallop and no friction rub.  No murmur heard. Pulmonary/Chest: Effort normal and breath sounds normal. No stridor. No respiratory distress. He has no wheezes. He has no rales. He exhibits no tenderness.  Abdominal: Soft. He exhibits no distension. Bowel sounds are increased. There is tenderness in the right upper  quadrant, epigastric area and left upper quadrant. There is no rigidity, no rebound, no guarding, no CVA tenderness, no tenderness at McBurney's point and negative Murphy's sign.  Musculoskeletal: Normal range of motion. He exhibits no tenderness.  Lymphadenopathy:    He has no cervical adenopathy.  Neurological: He is alert and oriented to person, place, and time.  Skin: Skin is warm and dry. Capillary refill takes less than 2 seconds. No rash noted. There is pallor.  Psychiatric: His behavior is normal. Judgment and thought content normal.  Nursing note and vitals reviewed.    ED Treatments / Results  Labs (all labs ordered are listed, but only abnormal results are displayed) Labs Reviewed  COMPREHENSIVE METABOLIC PANEL - Abnormal; Notable for the following components:      Result Value   Sodium 129 (*)    Potassium 3.1 (*)    Chloride 96 (*)    Glucose, Bld 292 (*)    BUN 21 (*)    Creatinine, Ser 1.27 (*)    Calcium 8.7 (*)    All  other components within normal limits  CBC - Abnormal; Notable for the following components:   HCT 37.7 (*)    MCHC 37.9 (*)    All other components within normal limits  URINALYSIS, ROUTINE W REFLEX MICROSCOPIC - Abnormal; Notable for the following components:   Glucose, UA >=500 (*)    Hgb urine dipstick TRACE (*)    Protein, ur 30 (*)    All other components within normal limits  URINALYSIS, MICROSCOPIC (REFLEX) - Abnormal; Notable for the following components:   Bacteria, UA FEW (*)    All other components within normal limits  CBG MONITORING, ED - Abnormal; Notable for the following components:   Glucose-Capillary 281 (*)    All other components within normal limits  LIPASE, BLOOD    EKG  EKG Interpretation  Date/Time:  Saturday October 12 2017 12:50:45 EST Ventricular Rate:  78 PR Interval:    QRS Duration: 116 QT Interval:  405 QTC Calculation: 462 R Axis:   27 Text Interpretation:  Sinus rhythm Probable left atrial  enlargement new Nonspecific intraventricular conduction delay new Nonspecific repol abnormality, lateral leads Confirmed by Gwyneth SproutPlunkett, Whitney ([id number]) on 10/12/2017 1:37:50 PM       Radiology Dg Abdomen Acute W/chest  Result Date: 10/12/2017 CLINICAL DATA:  47 year old male with vomiting, diarrhea, weakness and fatigue EXAM: DG ABDOMEN ACUTE W/ 1V CHEST COMPARISON:  Prior chest x-ray 06/03/2016 FINDINGS: Cardiac and mediastinal contours are within normal limits. The lungs are clear. No pneumothorax or pleural effusion. No evidence of free air. Gas is present within a few loops of nondistended bowel in the left mid abdomen. Gas is otherwise present throughout the colon. No abnormal mass effect or calcifications. Osseous structures are intact and unremarkable. IMPRESSION: 1. Normal chest x-ray. 2. Nonspecific bowel gas pattern with gas in a few loops of borderline distended small bowel in the left mid abdomen. Differential considerations include localized reactive ileus versus very early or partial small bowel obstruction. Electronically Signed   By: Malachy MoanHeath  McCullough M.D.   On: 10/12/2017 13:44    Procedures Procedures (including critical care time)  Medications Ordered in ED Medications  ondansetron (ZOFRAN) injection 4 mg (4 mg Intravenous Given 10/12/17 1158)  HYDROmorphone (DILAUDID) injection 0.5 mg (0.5 mg Intravenous Given 10/12/17 1247)  famotidine (PEPCID) IVPB 20 mg premix (0 mg Intravenous Stopped 10/12/17 1355)  gi cocktail (Maalox,Lidocaine,Donnatal) (30 mLs Oral Given 10/12/17 1305)  lactated ringers bolus 1,000 mL (0 mLs Intravenous Stopped 10/12/17 1604)     Initial Impression / Assessment and Plan / ED Course  I have reviewed the triage vital signs and the nursing notes.  Pertinent labs & imaging results that were available during my care of the patient were reviewed by me and considered in my medical decision making (see chart for details).     Patient presents to the emergency  department today with complaints of epigastric abdominal pain, nausea and emesis.  Patient was recently discharged from the hospital last week after being admitted for hypertensive urgency, AK I, dehydration.  The patient's blood pressure medicine was recently changed.  He was also started on insulin for his uncontrolled diabetes on metformin.  She states that his vomiting improved while in the hospital however yesterday it returned after taking his medications.  Patient reports no bowel movement in the past week however he does report passing gas.  Patient also reports having very limited intake over the past week.  On exam patient is pale and diaphoretic.  Vital signs  are reassuring.  He is afebrile, no tachycardia, no hypotension is noted.  Patient is mildly hypertensive but he has not taken his medications this morning.  Bowel sounds are hyperactive.  He does have some mild tenderness to palpation of the epigastric and left upper quadrant areas but no signs of peritonitis.  Lungs clear to auscultation bilaterally.Marland Kitchen  Heart regular rate and rhythm.  Lab work reveals no leukocytosis.  Patient's creatinine is 1.27 which appears at patient's baseline.  He has a mild hyponatremia however corrected with his hyperglycemia is 133.  Mild hypokalemia 3.1 which was placed orally will be discharged home on potassium supplementation.  Liver enzymes are normal.  Normal lipase.  Patient has no signs of DKA.  Urine shows no signs of infection.   For similar symptoms the patient had when he was admitted to [facility].  At that time he underwent a endoscopy and a CT scan that showed relatively unremarkable findings.  Patient did have some mild esophagitis and stricture on EGD.  Acute abdomen obtained today;  IMPRESSION: 1. Normal chest x-ray. 2. Nonspecific bowel gas pattern with gas in a few loops of borderline distended small bowel in the left mid abdomen. Differential considerations include localized  reactive ileus versus very early or partial small bowel obstruction.  Patient is passing gas.  Low suspicion for small bowel obstruction.  Likely ileus in the setting of recent hospitalization.  Patient's nausea has been controlled in the ED.  He was given Pepcid and GI cocktail with complete resolution of his abdominal pain.  Patient was able to tolerate p.o. fluids without any emesis.  He appears and feels much improved after treatment in the ED.  He feels like he can be discharged home with outpatient follow-up.  Repeat abdominal exam was benign without any signs of peritonitis.  Vital signs remained reassuring.  Have instructed patient that if he is not passing gas or has worsening vomiting after treatment of Reglan at home that he may come back to the ED for evaluation.  I also instructed patient that his symptoms may be due to possible diabetic gastroparesis and will need to follow-up with GI.  Pt is hemodynamically stable, in NAD, & able to ambulate in the ED. Evaluation does not show pathology that would require ongoing emergent intervention or inpatient treatment. I explained the diagnosis to the patient. Pain has been managed & has no complaints prior to dc. Pt is comfortable with above plan and is stable for discharge at this time. All questions were answered prior to disposition. Strict return precautions for f/u to the ED were discussed. Encouraged follow up with PCP.]  Pt seen and eval by my attending Dr. Tanna Savoy who is agreeable with the above plna.   Final Clinical Impressions(s) / ED Diagnoses   Final diagnoses:  Epigastric abdominal pain  Nausea vomiting and diarrhea    ED Discharge Orders        Ordered    metoCLOPramide (REGLAN) 10 MG tablet  Every 6 hours     10/12/17 1532    promethazine (PHENERGAN) 25 MG suppository  Every 6 hours PRN     10/12/17 1532       Rise Mu, PA-C 10/13/17 0102    Gwyneth Sprout, MD 10/13/17 2006

## 2017-10-12 NOTE — ED Triage Notes (Signed)
Vomiting since Sunday. [facility]. Was D/C Thursday and was feeling better. Pt started vomiting again this morning.

## 2017-10-12 NOTE — Discharge Instructions (Signed)
Your abdominal pain is likely from gastritis, reflux or a stomach ulcer. You will need to take the prescribed proton pump inhibitor as directed, and avoid spicy/fatty/acidic foods. Avoid laying down flat within 30 minutes of eating. Avoid NSAIDs like ibuprofen or Aleve on an empty stomach. Follow up with the gastroenterologist (GI doctor) listed for ongoing evaluation of your abdominal pain. Return to the ER for new or worsening symptoms, any additional concers.  Have given you both Reglan and phenergan for nausea. Take one or the other. Continue the Carafate and and pantoprazole.     SEEK IMMEDIATE MEDICAL ATTENTION IF YOU DEVELOP ANY OF THE FOLLOWING SYMPTOMS: The pain does not go away or becomes severe.  A temperature above 101 develops.  Repeated vomiting occurs (multiple episodes).  Blood is being passed in stools or vomit (bright red or black tarry stools).  Return also if you develop chest pain, difficulty breathing, dizziness or fainting

## 2017-10-12 NOTE — ED Notes (Signed)
Pt using urinal with family assisting

## 2017-10-15 ENCOUNTER — Inpatient Hospital Stay (HOSPITAL_BASED_OUTPATIENT_CLINIC_OR_DEPARTMENT_OTHER)
Admission: EM | Admit: 2017-10-15 | Discharge: 2017-10-19 | DRG: 246 | Disposition: A | Discharge status: Home / Self Care | Payer: PRIVATE HEALTH INSURANCE | Attending: Cardiology | Admitting: Cardiology

## 2017-10-15 ENCOUNTER — Emergency Department (HOSPITAL_BASED_OUTPATIENT_CLINIC_OR_DEPARTMENT_OTHER): Payer: PRIVATE HEALTH INSURANCE

## 2017-10-15 ENCOUNTER — Inpatient Hospital Stay (HOSPITAL_COMMUNITY)
Admission: EM | Disposition: A | Discharge status: Home / Self Care | Payer: Self-pay | Source: Home / Self Care | Attending: Cardiology

## 2017-10-15 ENCOUNTER — Encounter (HOSPITAL_BASED_OUTPATIENT_CLINIC_OR_DEPARTMENT_OTHER): Payer: Self-pay

## 2017-10-15 DIAGNOSIS — I10 Essential (primary) hypertension: Secondary | ICD-10-CM | POA: Diagnosis not present

## 2017-10-15 DIAGNOSIS — I161 Hypertensive emergency: Secondary | ICD-10-CM | POA: Diagnosis not present

## 2017-10-15 DIAGNOSIS — Z794 Long term (current) use of insulin: Secondary | ICD-10-CM | POA: Diagnosis not present

## 2017-10-15 DIAGNOSIS — I493 Ventricular premature depolarization: Secondary | ICD-10-CM | POA: Diagnosis not present

## 2017-10-15 DIAGNOSIS — I2510 Atherosclerotic heart disease of native coronary artery without angina pectoris: Secondary | ICD-10-CM

## 2017-10-15 DIAGNOSIS — E118 Type 2 diabetes mellitus with unspecified complications: Secondary | ICD-10-CM

## 2017-10-15 DIAGNOSIS — E1143 Type 2 diabetes mellitus with diabetic autonomic (poly)neuropathy: Secondary | ICD-10-CM | POA: Diagnosis present

## 2017-10-15 DIAGNOSIS — I214 Non-ST elevation (NSTEMI) myocardial infarction: Principal | ICD-10-CM | POA: Diagnosis present

## 2017-10-15 DIAGNOSIS — I129 Hypertensive chronic kidney disease with stage 1 through stage 4 chronic kidney disease, or unspecified chronic kidney disease: Secondary | ICD-10-CM | POA: Diagnosis present

## 2017-10-15 DIAGNOSIS — I2582 Chronic total occlusion of coronary artery: Secondary | ICD-10-CM | POA: Diagnosis present

## 2017-10-15 DIAGNOSIS — E1343 Other specified diabetes mellitus with diabetic autonomic (poly)neuropathy: Secondary | ICD-10-CM | POA: Diagnosis not present

## 2017-10-15 DIAGNOSIS — M109 Gout, unspecified: Secondary | ICD-10-CM | POA: Diagnosis present

## 2017-10-15 DIAGNOSIS — R072 Precordial pain: Secondary | ICD-10-CM

## 2017-10-15 DIAGNOSIS — Z955 Presence of coronary angioplasty implant and graft: Secondary | ICD-10-CM

## 2017-10-15 DIAGNOSIS — E1122 Type 2 diabetes mellitus with diabetic chronic kidney disease: Secondary | ICD-10-CM | POA: Diagnosis present

## 2017-10-15 DIAGNOSIS — Z87891 Personal history of nicotine dependence: Secondary | ICD-10-CM

## 2017-10-15 DIAGNOSIS — R109 Unspecified abdominal pain: Secondary | ICD-10-CM

## 2017-10-15 DIAGNOSIS — K3184 Gastroparesis: Secondary | ICD-10-CM | POA: Diagnosis present

## 2017-10-15 DIAGNOSIS — Z86711 Personal history of pulmonary embolism: Secondary | ICD-10-CM

## 2017-10-15 DIAGNOSIS — E1165 Type 2 diabetes mellitus with hyperglycemia: Secondary | ICD-10-CM

## 2017-10-15 DIAGNOSIS — N182 Chronic kidney disease, stage 2 (mild): Secondary | ICD-10-CM | POA: Diagnosis present

## 2017-10-15 HISTORY — DX: Chronic kidney disease, stage 2 (mild): N18.2

## 2017-10-15 HISTORY — DX: Type 2 diabetes mellitus without complications: E11.9

## 2017-10-15 HISTORY — PX: LEFT HEART CATH AND CORONARY ANGIOGRAPHY: CATH118249

## 2017-10-15 HISTORY — DX: Reserved for inherently not codable concepts without codable children: IMO0001

## 2017-10-15 HISTORY — DX: Atherosclerotic heart disease of native coronary artery without angina pectoris: I25.10

## 2017-10-15 HISTORY — DX: Long term (current) use of insulin: Z79.4

## 2017-10-15 LAB — COMPREHENSIVE METABOLIC PANEL
ALBUMIN: 4.3 g/dL (ref 3.5–5.0)
ALK PHOS: 85 U/L (ref 38–126)
ALT: 20 U/L (ref 17–63)
AST: 28 U/L (ref 15–41)
Anion gap: 12 (ref 5–15)
BILIRUBIN TOTAL: 0.7 mg/dL (ref 0.3–1.2)
BUN: 17 mg/dL (ref 6–20)
CO2: 19 mmol/L — ABNORMAL LOW (ref 22–32)
CREATININE: 1.24 mg/dL (ref 0.61–1.24)
Calcium: 9.2 mg/dL (ref 8.9–10.3)
Chloride: 104 mmol/L (ref 101–111)
GFR calc Af Amer: >60 mL/min (ref >60)
Glucose, Bld: 295 mg/dL — ABNORMAL HIGH (ref 65–99)
Potassium: 3.5 mmol/L (ref 3.5–5.1)
Sodium: 135 mmol/L (ref 135–145)
TOTAL PROTEIN: 7.5 g/dL (ref 6.5–8.1)

## 2017-10-15 LAB — TROPONIN I
Troponin I: 0.11 ng/mL (ref <0.03)
Troponin I: 1.48 ng/mL (ref <0.03)
Troponin I: 5.56 ng/mL (ref <0.03)

## 2017-10-15 LAB — CBC
HEMATOCRIT: 37.4 % — AB (ref 39.0–52.0)
HEMOGLOBIN: 14.3 g/dL (ref 13.0–17.0)
MCH: 32.1 pg (ref 26.0–34.0)
MCHC: 38.2 g/dL — ABNORMAL HIGH (ref 30.0–36.0)
MCV: 84 fL (ref 78.0–100.0)
Platelets: 219 10*3/uL (ref 150–400)
RBC: 4.45 MIL/uL (ref 4.22–5.81)
RDW: 11.8 % (ref 11.5–15.5)
WBC: 8.8 10*3/uL (ref 4.0–10.5)

## 2017-10-15 LAB — LIPASE, BLOOD: LIPASE: 26 U/L (ref 11–51)

## 2017-10-15 IMAGING — CR DG CHEST 2V
2 series · 2 of 2 positions shown · non-contrast
Comparison: [DATE].  [DATE].

CLINICAL DATA: Cough.  Chest pain

EXAM:
CHEST  2 VIEW

[w chest lat]
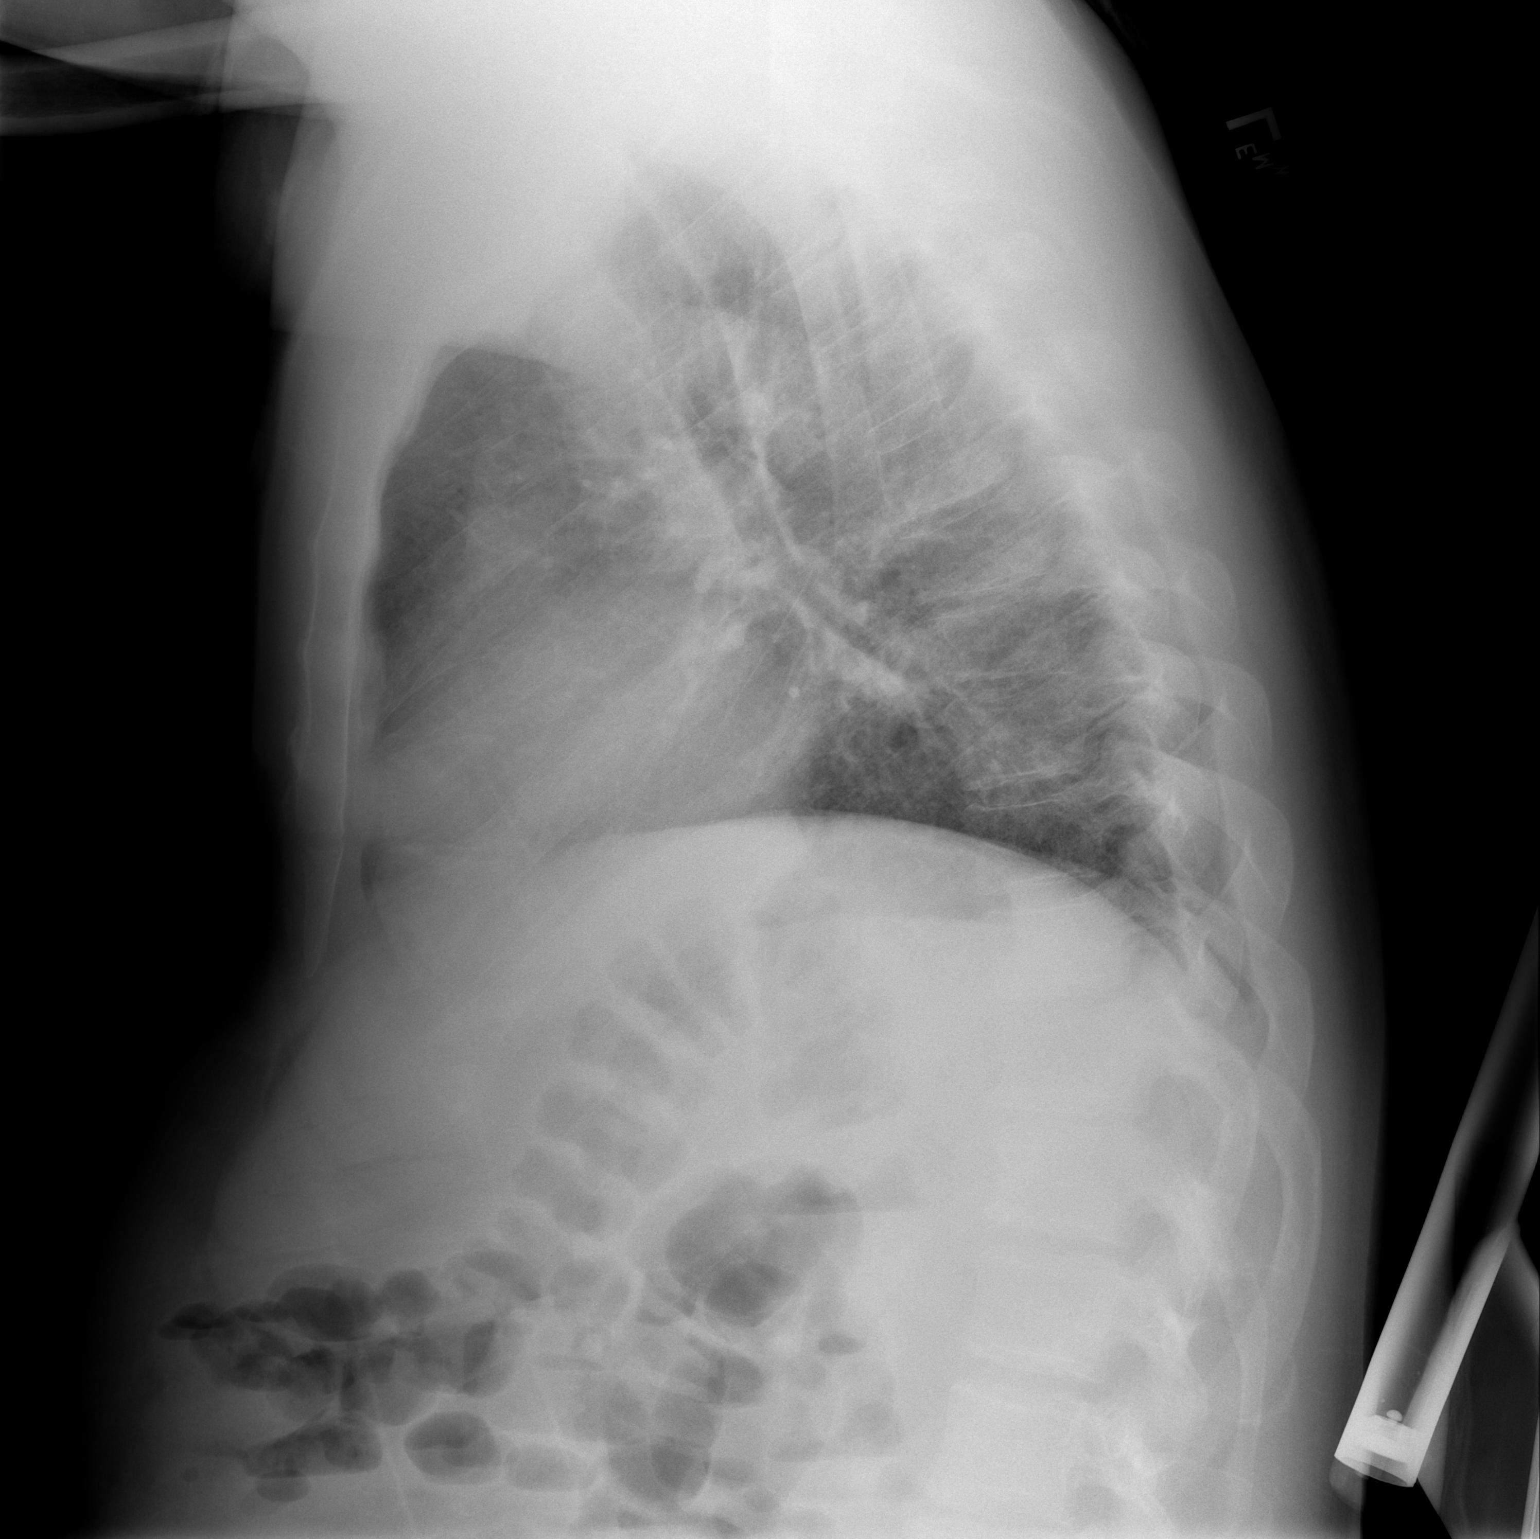

[w chest pa]
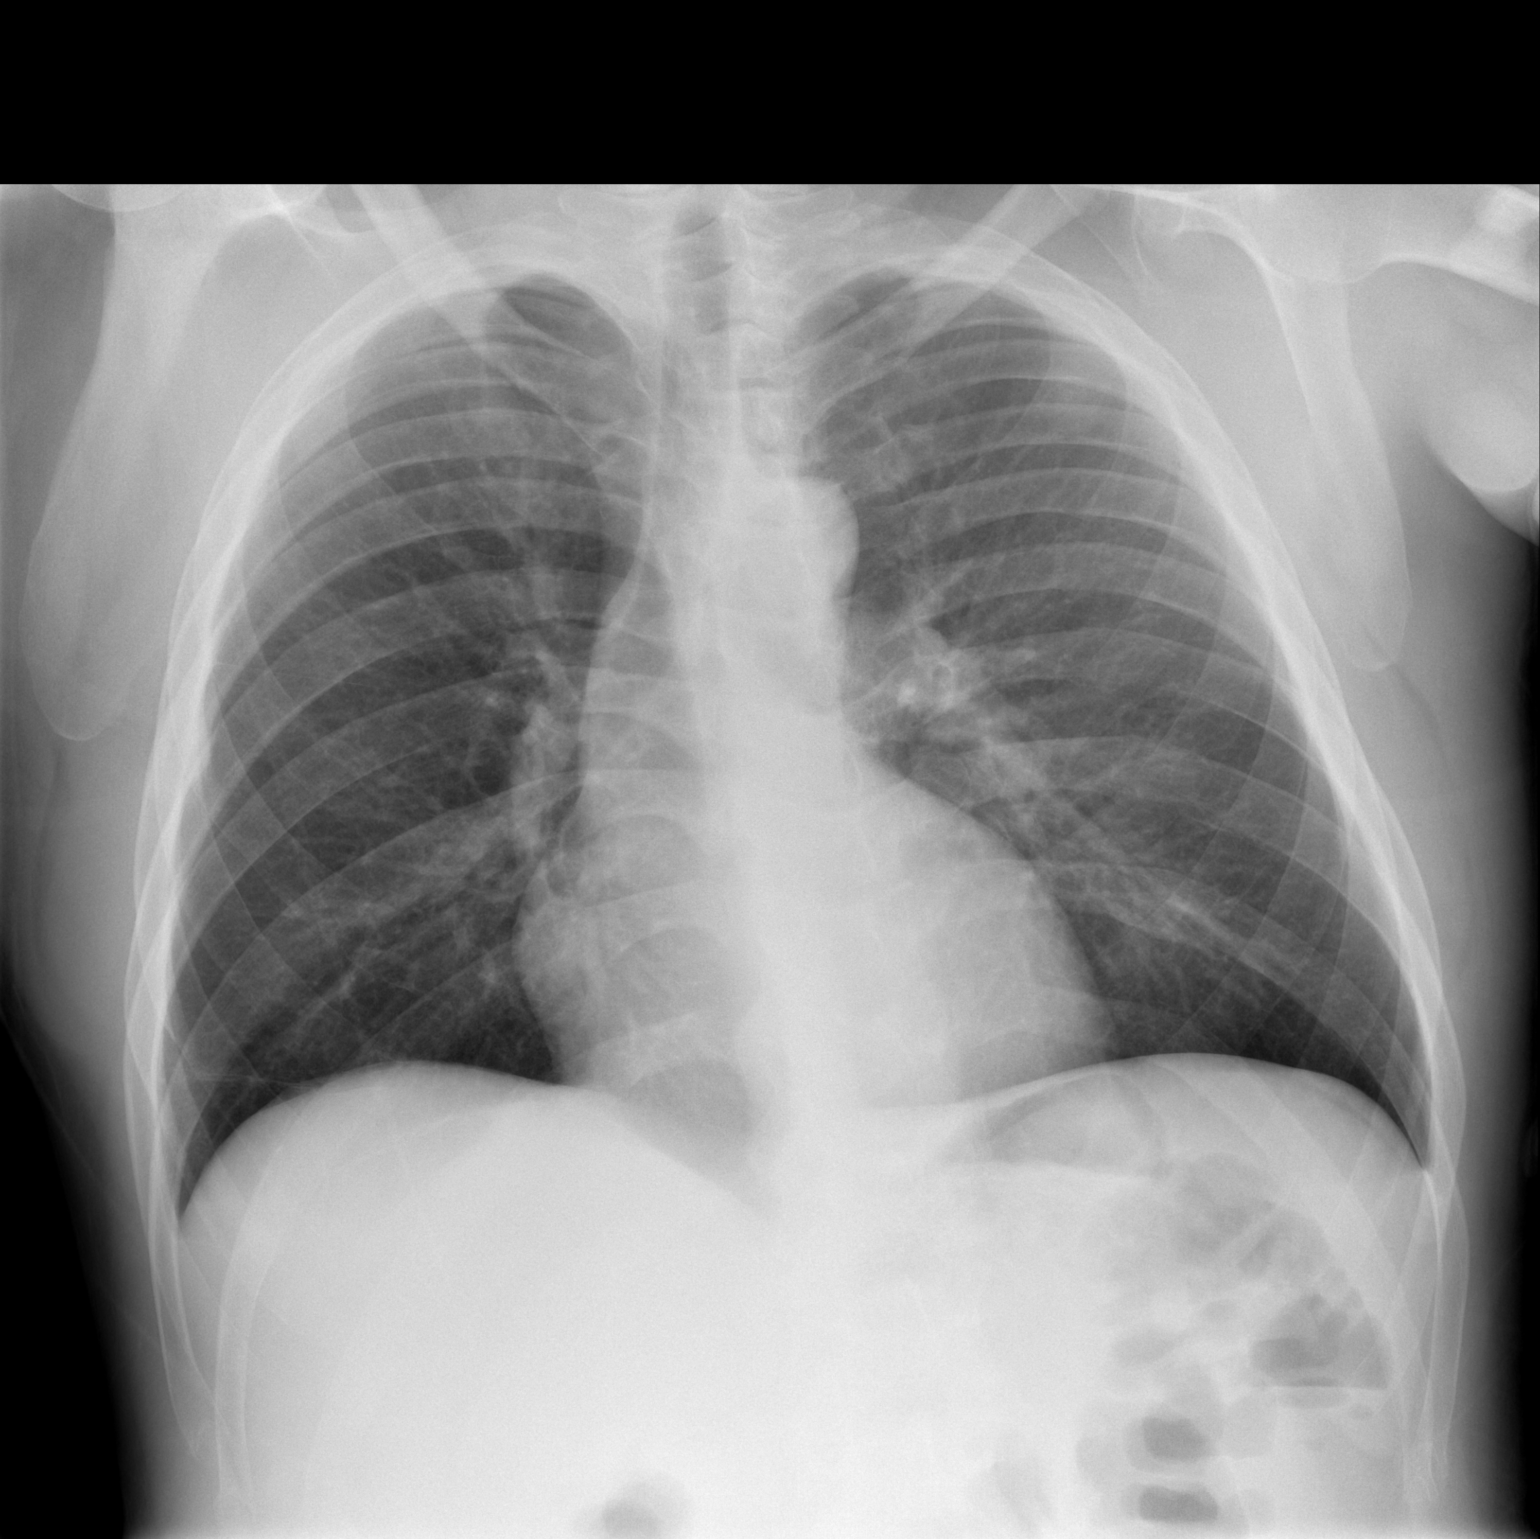

[2 of 2 positions shown; findings below may reference images not displayed]

FINDINGS: Heart size is normal. Mediastinal shadows are normal. Question mild
patchy infiltrate in the left lower lobe seen on the frontal view.
Not confirmed on the lateral view. No dense consolidation, volume
loss or effusion. No bone abnormality.
IMPRESSION: No active disease versus hazy infiltrate left lower lobe.

## 2017-10-15 IMAGING — DX DG ABDOMEN 2V
2 series · 2 of 2 positions shown · non-contrast
Comparison: Abdomen series [DATE]

CLINICAL DATA: Abdominal pain with nausea and vomiting

EXAM:
ABDOMEN - 2 VIEW

[abdomen erect]
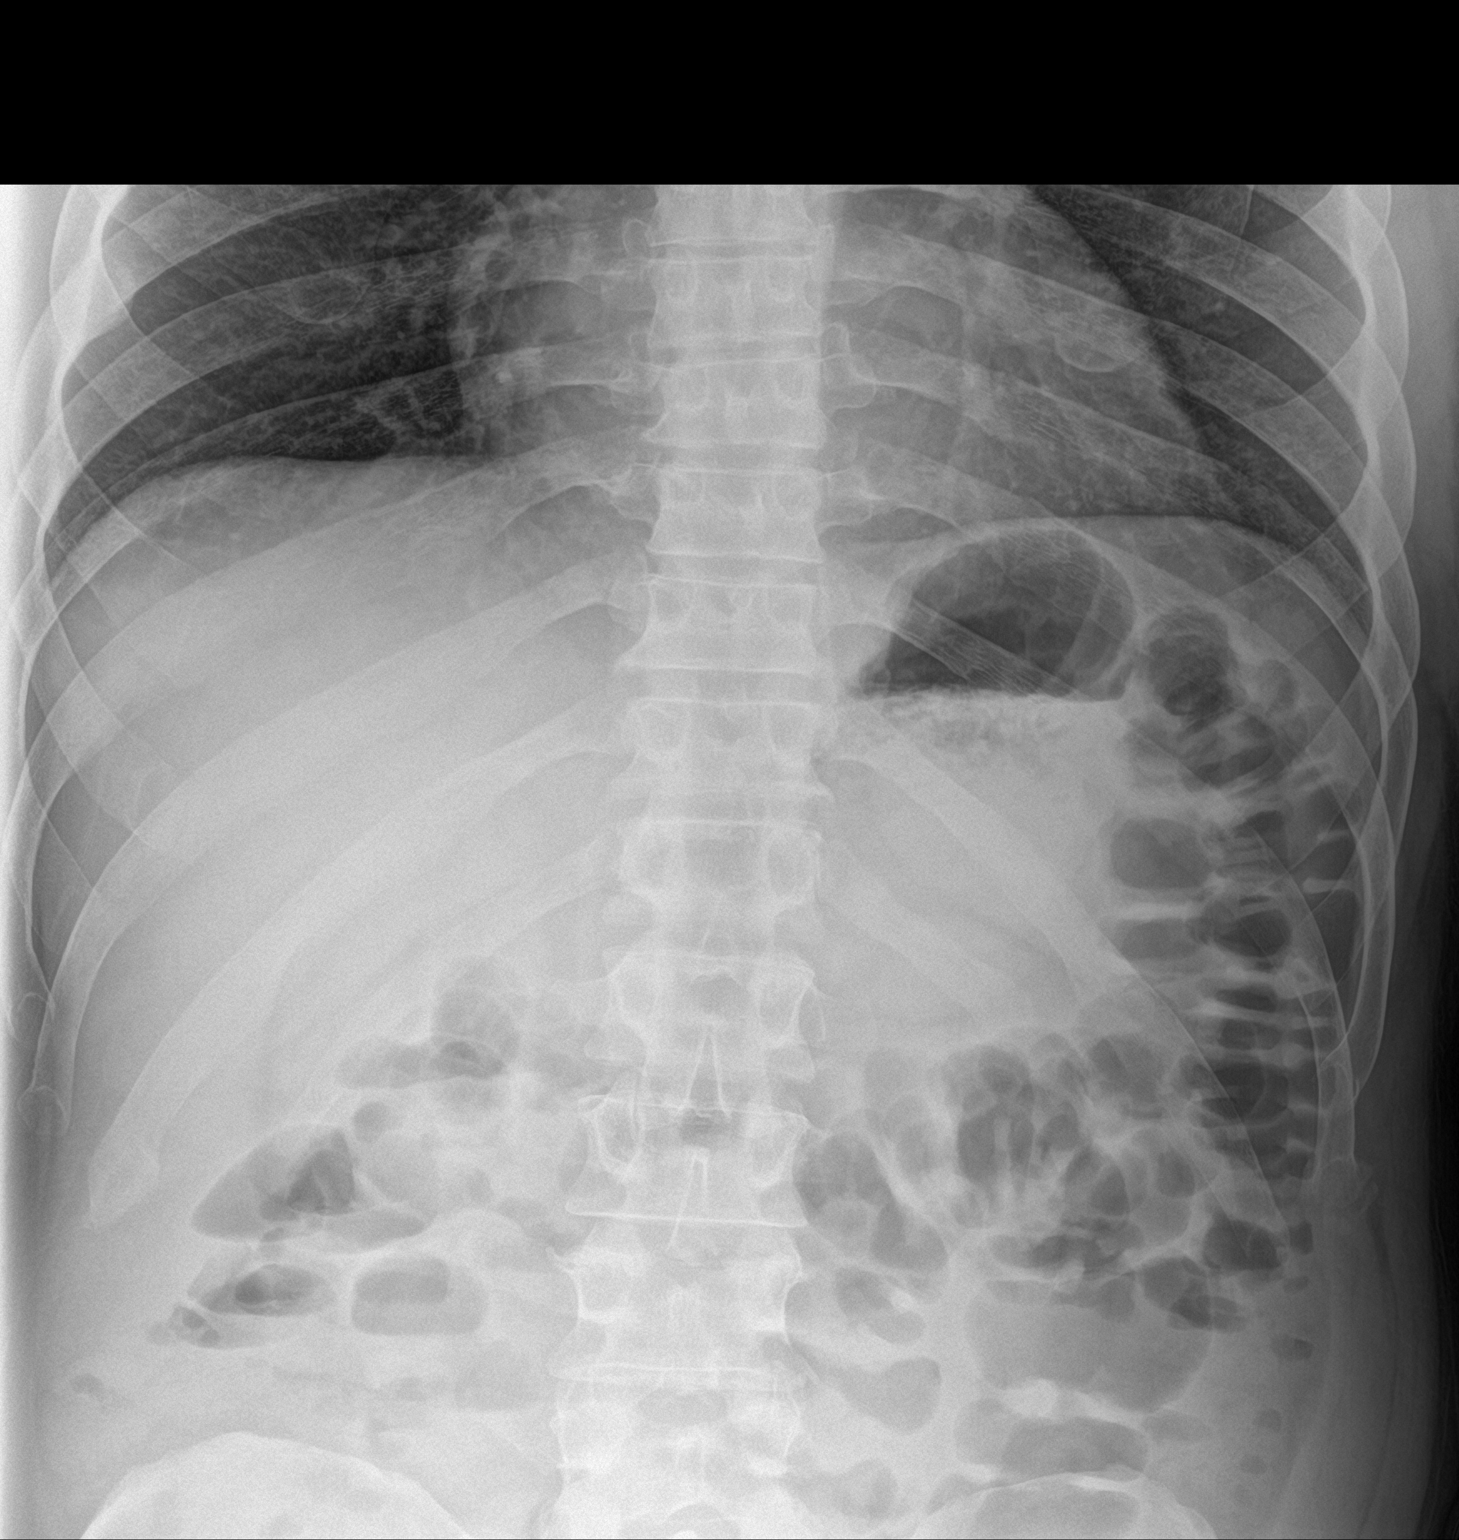

[abdomen supine]
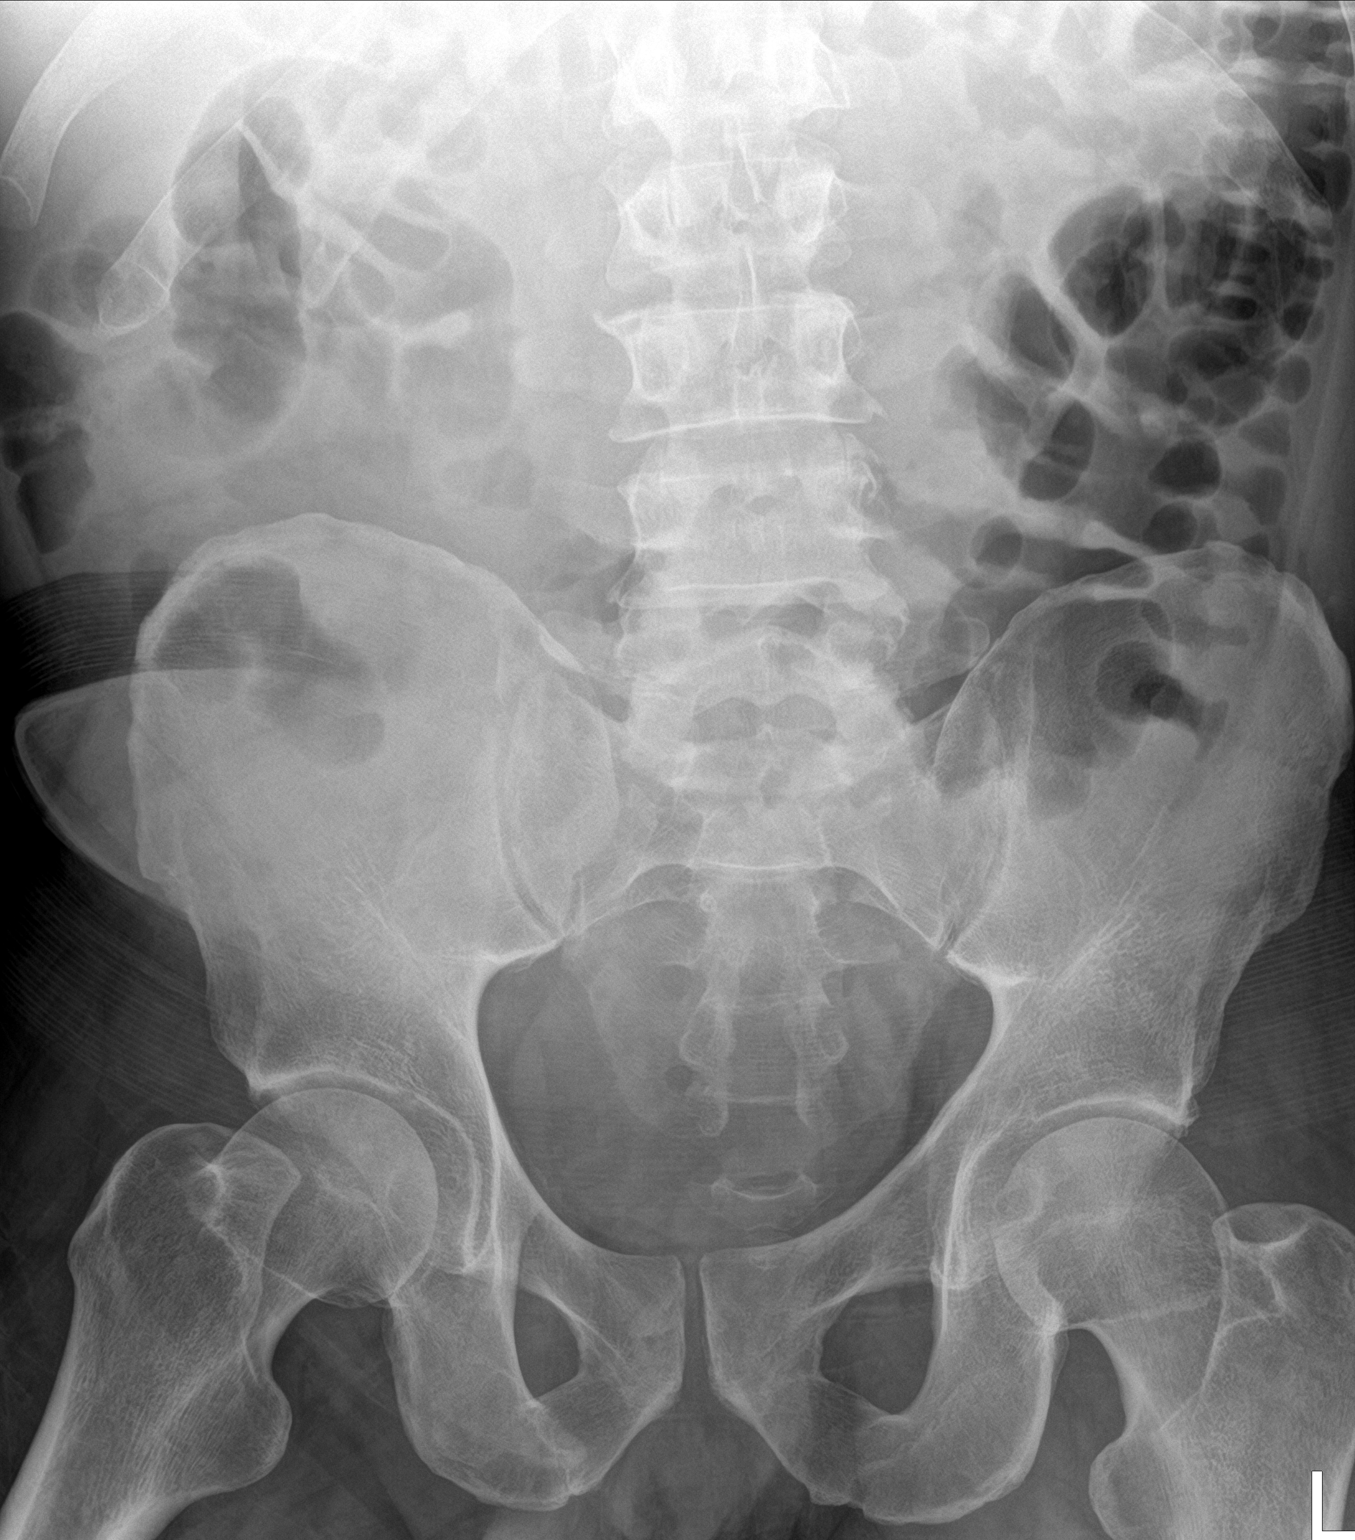

[2 of 2 positions shown; findings below may reference images not displayed]

FINDINGS: Supine and upright images obtained. There are multiple air-fluid
levels throughout the abdomen. No appreciable bowel dilatation. No
free air. Lung bases clear. No abnormal calcifications.
IMPRESSION: Multiple air-fluid levels without appreciable bowel dilatation.
Suspect a degree of ileus or enteritis, although a degree of partial
bowel obstruction is possible. Note that there is moderate air in
the colon. No free air. Lung bases clear.

## 2017-10-15 SURGERY — Surgical Case
Anesthesia: *Unknown

## 2017-10-15 SURGERY — LEFT HEART CATH AND CORONARY ANGIOGRAPHY
Anesthesia: LOCAL

## 2017-10-15 MED ORDER — ASPIRIN 81 MG PO CHEW
81.0000 mg | CHEWABLE_TABLET | Freq: Every day | ORAL | Status: DC
  Administered 2017-10-16 – 2017-10-19 (×4): 81 mg via ORAL
  Filled 2017-10-15 (×4): qty 1

## 2017-10-15 MED ORDER — FENTANYL CITRATE (PF) 100 MCG/2ML IJ SOLN
50.0000 ug | Freq: Once | INTRAMUSCULAR | Status: AC
  Administered 2017-10-15: 50 ug via INTRAVENOUS
  Filled 2017-10-15: qty 2

## 2017-10-15 MED ORDER — HEPARIN (PORCINE) IN NACL 100-0.45 UNIT/ML-% IJ SOLN
1200.0000 [IU]/h | INTRAMUSCULAR | Status: DC
  Administered 2017-10-15: 1200 [IU]/h via INTRAVENOUS
  Filled 2017-10-15: qty 250

## 2017-10-15 MED ORDER — NITROGLYCERIN IN D5W 200-5 MCG/ML-% IV SOLN
0.0000 ug/min | Freq: Once | INTRAVENOUS | Status: AC
  Administered 2017-10-15: 5 ug/min via INTRAVENOUS
  Filled 2017-10-15: qty 250

## 2017-10-15 MED ORDER — LIDOCAINE HCL (PF) 1 % IJ SOLN
INTRAMUSCULAR | Status: DC | PRN
  Administered 2017-10-15: 2 mL

## 2017-10-15 MED ORDER — INSULIN DETEMIR 100 UNIT/ML ~~LOC~~ SOLN
20.0000 [IU] | Freq: Every day | SUBCUTANEOUS | Status: DC
  Administered 2017-10-16 – 2017-10-19 (×4): 20 [IU] via SUBCUTANEOUS
  Filled 2017-10-15 (×4): qty 0.2

## 2017-10-15 MED ORDER — LABETALOL HCL 5 MG/ML IV SOLN: 10.0000 mg | INTRAVENOUS | Status: DC | PRN

## 2017-10-15 MED ORDER — SODIUM CHLORIDE 0.9 % IV BOLUS (SEPSIS)
1000.0000 mL | Freq: Once | INTRAVENOUS | Status: AC
  Administered 2017-10-15: 1000 mL via INTRAVENOUS

## 2017-10-15 MED ORDER — MORPHINE SULFATE (PF) 4 MG/ML IV SOLN
2.0000 mg | INTRAVENOUS | Status: DC | PRN
  Administered 2017-10-18: 12:00:00 2 mg via INTRAVENOUS
  Filled 2017-10-15: qty 1

## 2017-10-15 MED ORDER — OXYCODONE-ACETAMINOPHEN 5-325 MG PO TABS
1.0000 | ORAL_TABLET | ORAL | Status: DC | PRN
Start: 2017-10-15 — End: 2017-10-19
  Administered 2017-10-18 (×2): 1 via ORAL
  Filled 2017-10-15 (×2): qty 1

## 2017-10-15 MED ORDER — NITROGLYCERIN IN D5W 200-5 MCG/ML-% IV SOLN
INTRAVENOUS | Status: AC | PRN
  Administered 2017-10-15: 20 ug/min via INTRAVENOUS

## 2017-10-15 MED ORDER — FENTANYL CITRATE (PF) 100 MCG/2ML IJ SOLN
INTRAMUSCULAR | Status: AC
  Filled 2017-10-15: qty 2

## 2017-10-15 MED ORDER — SODIUM CHLORIDE 0.9% FLUSH
3.0000 mL | Freq: Two times a day (BID) | INTRAVENOUS | Status: DC
  Administered 2017-10-16: 10 mL via INTRAVENOUS
  Administered 2017-10-16 – 2017-10-19 (×6): 3 mL via INTRAVENOUS

## 2017-10-15 MED ORDER — HEPARIN (PORCINE) IN NACL 2-0.9 UNIT/ML-% IJ SOLN
INTRAMUSCULAR | Status: DC | PRN
  Administered 2017-10-15: 1000 mL via INTRA_ARTERIAL

## 2017-10-15 MED ORDER — HEPARIN BOLUS VIA INFUSION: 4000.0000 [IU] | Freq: Once | INTRAVENOUS | Status: DC

## 2017-10-15 MED ORDER — SODIUM CHLORIDE 0.9 % IV SOLN: INTRAVENOUS | Status: AC

## 2017-10-15 MED ORDER — NITROGLYCERIN IN D5W 200-5 MCG/ML-% IV SOLN
0.0000 ug/min | Freq: Once | INTRAVENOUS | Status: AC
  Administered 2017-10-15: 20 ug/min via INTRAVENOUS

## 2017-10-15 MED ORDER — HEPARIN BOLUS VIA INFUSION
4000.0000 [IU] | Freq: Once | INTRAVENOUS | Status: AC
  Administered 2017-10-15: 4000 [IU] via INTRAVENOUS

## 2017-10-15 MED ORDER — LABETALOL HCL 5 MG/ML IV SOLN
10.0000 mg | Freq: Once | INTRAVENOUS | Status: AC
Start: 2017-10-15 — End: 2017-10-15
  Administered 2017-10-15: 10 mg via INTRAVENOUS
  Filled 2017-10-15: qty 4

## 2017-10-15 MED ORDER — LISINOPRIL 10 MG PO TABS
10.0000 mg | ORAL_TABLET | Freq: Every day | ORAL | Status: DC
  Administered 2017-10-16 – 2017-10-17 (×2): 10 mg via ORAL
  Filled 2017-10-15 (×2): qty 1

## 2017-10-15 MED ORDER — ONDANSETRON HCL 4 MG/2ML IJ SOLN: 4.0000 mg | Freq: Four times a day (QID) | INTRAMUSCULAR | Status: DC | PRN

## 2017-10-15 MED ORDER — NICARDIPINE HCL IN NACL 20-0.86 MG/200ML-% IV SOLN: 3.0000 mg/h | Freq: Once | INTRAVENOUS | Status: DC

## 2017-10-15 MED ORDER — VERAPAMIL HCL 2.5 MG/ML IV SOLN
INTRAVENOUS | Status: AC
Start: 2017-10-15 — End: 2017-10-15
  Filled 2017-10-15: qty 2

## 2017-10-15 MED ORDER — METOCLOPRAMIDE HCL 10 MG PO TABS
10.0000 mg | ORAL_TABLET | Freq: Four times a day (QID) | ORAL | Status: DC | PRN
  Filled 2017-10-15: qty 1

## 2017-10-15 MED ORDER — SODIUM CHLORIDE 0.9 % IV SOLN: 250.0000 mL | INTRAVENOUS | Status: DC | PRN

## 2017-10-15 MED ORDER — MIDAZOLAM HCL 2 MG/2ML IJ SOLN
INTRAMUSCULAR | Status: AC
  Filled 2017-10-15: qty 2

## 2017-10-15 MED ORDER — CARVEDILOL 3.125 MG PO TABS
6.2500 mg | ORAL_TABLET | Freq: Two times a day (BID) | ORAL | Status: DC
  Administered 2017-10-16 – 2017-10-18 (×5): 6.25 mg via ORAL
  Filled 2017-10-15: qty 1
  Filled 2017-10-15: qty 2
  Filled 2017-10-15 (×2): qty 1
  Filled 2017-10-15: qty 2

## 2017-10-15 MED ORDER — NITROGLYCERIN 2 % TD OINT
1.0000 [in_us] | TOPICAL_OINTMENT | Freq: Once | TRANSDERMAL | Status: AC
  Administered 2017-10-15: 1 [in_us] via TOPICAL
  Filled 2017-10-15: qty 1

## 2017-10-15 MED ORDER — ASPIRIN 81 MG PO CHEW
324.0000 mg | CHEWABLE_TABLET | Freq: Once | ORAL | Status: AC
  Administered 2017-10-15: 324 mg via ORAL
  Filled 2017-10-15: qty 4

## 2017-10-15 MED ORDER — HEPARIN (PORCINE) IN NACL 100-0.45 UNIT/ML-% IJ SOLN: 1200.0000 [IU]/h | INTRAMUSCULAR | Status: DC

## 2017-10-15 MED ORDER — SODIUM CHLORIDE 0.9 % IV SOLN
Freq: Once | INTRAVENOUS | Status: AC
  Administered 2017-10-15: 16:00:00 via INTRAVENOUS

## 2017-10-15 MED ORDER — MIDAZOLAM HCL 2 MG/2ML IJ SOLN
INTRAMUSCULAR | Status: DC | PRN
  Administered 2017-10-15: 2 mg via INTRAVENOUS

## 2017-10-15 MED ORDER — HEPARIN (PORCINE) IN NACL 2-0.9 UNIT/ML-% IJ SOLN
INTRAMUSCULAR | Status: AC
  Filled 2017-10-15: qty 1500

## 2017-10-15 MED ORDER — SODIUM CHLORIDE 0.9% FLUSH: 3.0000 mL | INTRAVENOUS | Status: DC | PRN

## 2017-10-15 MED ORDER — INSULIN ASPART 100 UNIT/ML ~~LOC~~ SOLN
0.0000 [IU] | Freq: Three times a day (TID) | SUBCUTANEOUS | Status: DC
  Administered 2017-10-16: 5 [IU] via SUBCUTANEOUS
  Administered 2017-10-16: 2 [IU] via SUBCUTANEOUS
  Administered 2017-10-16: 3 [IU] via SUBCUTANEOUS
  Administered 2017-10-18: 05:00:00 5 [IU] via SUBCUTANEOUS
  Administered 2017-10-18: 2 [IU] via SUBCUTANEOUS
  Administered 2017-10-19: 3 [IU] via SUBCUTANEOUS

## 2017-10-15 MED ORDER — ACETAMINOPHEN 325 MG PO TABS: 650.0000 mg | ORAL_TABLET | ORAL | Status: DC | PRN

## 2017-10-15 MED ORDER — FENTANYL CITRATE (PF) 100 MCG/2ML IJ SOLN
INTRAMUSCULAR | Status: DC | PRN
  Administered 2017-10-15: 25 ug via INTRAVENOUS

## 2017-10-15 MED ORDER — IOPAMIDOL (ISOVUE-370) INJECTION 76%
INTRAVENOUS | Status: DC | PRN
  Administered 2017-10-15: 120 mL via INTRA_ARTERIAL

## 2017-10-15 MED ORDER — NITROGLYCERIN 1 MG/10 ML FOR IR/CATH LAB
INTRA_ARTERIAL | Status: AC
  Filled 2017-10-15: qty 10

## 2017-10-15 MED ORDER — LABETALOL HCL 5 MG/ML IV SOLN: 10.0000 mg | Freq: Once | INTRAVENOUS | Status: DC

## 2017-10-15 MED ORDER — ATORVASTATIN CALCIUM 80 MG PO TABS
80.0000 mg | ORAL_TABLET | Freq: Every day | ORAL | Status: DC
  Administered 2017-10-16 – 2017-10-18 (×3): 80 mg via ORAL
  Filled 2017-10-15 (×3): qty 1

## 2017-10-15 MED ORDER — LABETALOL HCL 5 MG/ML IV SOLN
10.0000 mg | Freq: Once | INTRAVENOUS | Status: AC
  Administered 2017-10-15: 10 mg via INTRAVENOUS
  Filled 2017-10-15: qty 4

## 2017-10-15 MED ORDER — METOCLOPRAMIDE HCL 5 MG/ML IJ SOLN
10.0000 mg | Freq: Once | INTRAMUSCULAR | Status: AC
  Administered 2017-10-15: 10 mg via INTRAVENOUS
  Filled 2017-10-15: qty 2

## 2017-10-15 MED ORDER — LIDOCAINE HCL (PF) 1 % IJ SOLN
INTRAMUSCULAR | Status: AC
  Filled 2017-10-15: qty 30

## 2017-10-15 MED ORDER — VERAPAMIL HCL 2.5 MG/ML IV SOLN
INTRAVENOUS | Status: DC | PRN
  Administered 2017-10-15: 23:00:00 via INTRA_ARTERIAL

## 2017-10-15 MED ORDER — MORPHINE SULFATE (PF) 4 MG/ML IV SOLN
4.0000 mg | Freq: Once | INTRAVENOUS | Status: AC
Start: 2017-10-15 — End: 2017-10-15
  Administered 2017-10-15: 4 mg via INTRAVENOUS
  Filled 2017-10-15: qty 1

## 2017-10-15 SURGICAL SUPPLY — 12 items
CATH INFINITI 5FR ANG PIGTAIL (CATHETERS) ×2 IMPLANT
CATH OPTITORQUE TIG 4.0 5F (CATHETERS) ×2 IMPLANT
DEVICE RAD COMP TR BAND LRG (VASCULAR PRODUCTS) ×2 IMPLANT
ELECT DEFIB PAD ADLT CADENCE (PAD) ×2 IMPLANT
GLIDESHEATH SLEND A-KIT 6F 22G (SHEATH) ×2 IMPLANT
GUIDEWIRE INQWIRE 1.5J.035X260 (WIRE) ×1 IMPLANT
INQWIRE 1.5J .035X260CM (WIRE) ×2
KIT HEART LEFT (KITS) ×2 IMPLANT
PACK CARDIAC CATHETERIZATION (CUSTOM PROCEDURE TRAY) ×2 IMPLANT
SYR MEDRAD MARK V 150ML (SYRINGE) ×2 IMPLANT
TRANSDUCER W/STOPCOCK (MISCELLANEOUS) ×2 IMPLANT
TUBING CIL FLEX 10 FLL-RA (TUBING) ×2 IMPLANT

## 2017-10-15 NOTE — ED Notes (Addendum)
Pt BP dropped to 129 systolic, increased diaphoresis, increased substernal CP 8/10, pt moaning in pain. EDP made aware. Nitro titrated down to 

## 2017-10-15 NOTE — ED Notes (Signed)
RN continues to be at bedside. Pt calm, remains diaphoretic. Family at bedside. Pt reports pain is still 5/10 substernal, Nonradiating. Reports SOB-no obvious SOB noted. Also reports slight nausea.

## 2017-10-15 NOTE — H&P (Addendum)
Cardiology Admission History and Physical:   Patient ID: Matthew Fisher; MRN: [id number]; DOB: 01/05/1971   Admission date: 10/15/2017  Primary Care Provider: Maylon Peppers, MD Primary Cardiologist: No primary care provider on file.  New to [facility] heart care Primary Electrophysiologist:  N/A  Chief Complaint: Chest pain, abdominal pain and elevated troponin  Patient Profile:   Matthew Fisher is a 47 y.o. male with a history of hypertension and diabetes who recently had shoulder surgery back in August 2018.  He also was recently admitted to [facility] with GI symptoms roughly 2 weeks ago.  This was likely related to viral gastroenteritis.   History of Present Illness:   Matthew Fisher has never had any prior cardiac history, however he presented to [place] emergency room earlier today with what sounds like epigastric pain.  This is thought to be may be related to his similar symptoms from his recent hospitalization, however he continued to have significant elevated blood pressures and persistent chest pain for roughly 12 hours despite being on increasing dose of nitroglycerin.  He also was receiving narcotics and other medications to lower his blood pressures.  He is remained roughly 4-7 out of 10 chest pain.  He has subsequently ruled in for MI with troponin of roughly 5.  He has subtle inferior ST depressions on EKG.  Therefore there is concern for possibly having active ongoing ischemia. Based on this concern, he is brought urgently to [facility] Cardiac Catheterization Lab for expedited ischemic evaluation.   The patient was met in the catheterization lab and examined and evaluated.  I briefly explained the procedure with risks, benefits, alternatives and indications of the procedure to the patient.  He voiced understanding and agreed to proceed with emergency consent.  Past Medical History:  Diagnosis Date  . Arthritis   . Diabetes mellitus without  complication ([place])   . Gout   . Hypertension     Past Surgical History:  Procedure Laterality Date  . FRACTURE SURGERY     right ring finger  . KNEE ARTHROSCOPY WITH LATERAL MENISECTOMY Left 01/22/2013   Procedure: LEFT KNEE ARTHROSCOPY DEBRIDEMENT CHONDROPLASTY, LATERAL RELEASE AND partial medial meniscectomy;  Surgeon: Johnn Hai, MD;  Location: [facility] ORS;  Service: Orthopedics;  Laterality: Left;  . KNEE SURGERY     "removed bone"     Medications Prior to Admission: Prior to Admission medications   Medication Sig Start Date End Date Taking? Authorizing Provider  indomethacin (INDOCIN) 50 MG capsule Take 50 mg by mouth 3 (three) times a week.     [provider]  insulin detemir (LEVEMIR) 100 UNIT/ML injection Inject 20 Units into the skin daily.    [provider]  lisinopril (PRINIVIL,ZESTRIL) 10 MG tablet Take 10 mg by mouth daily.    [provider]  metoCLOPramide (REGLAN) 10 MG tablet Take 1 tablet (10 mg total) by mouth every 6 (six) hours. 10/12/17   Doristine Devoid, PA-C  oxyCODONE-acetaminophen (PERCOCET) 5-325 MG per tablet Take 1-2 tablets by mouth every 4 (four) hours as needed for pain. 01/22/13   Susa Day, MD  oxyCODONE-acetaminophen (PERCOCET/ROXICET) 5-325 MG per tablet Take 1 tablet by mouth every 4 (four) hours as needed for pain.    [provider]  promethazine (PHENERGAN) 25 MG suppository Place 1 suppository (25 mg total) rectally every 6 (six) hours as needed for nausea or vomiting. 10/12/17   Doristine Devoid, PA-C     Allergies:  No Known Allergies  Social History:   Social History   Socioeconomic History  . Marital status: Divorced    Spouse name: Not on file  . Number of children: Not on file  . Years of education: Not on file  . Highest education level: Not on file  Social Needs  . Financial resource strain: Not on file  . Food insecurity - worry: Not on file  . Food insecurity - inability: Not on  file  . Transportation needs - medical: Not on file  . Transportation needs - non-medical: Not on file  Occupational History  . Not on file  Tobacco Use  . Smoking status: Former Smoker    Packs/day: 0.50    Years: 20.00    Pack years: 10.00    Types: Cigarettes    Last attempt to quit: 10/08/2002    Years since quitting: 15.0  . Smokeless tobacco: Never Used  Substance and Sexual Activity  . Alcohol use: Yes    Comment: occasional  . Drug use: No  . Sexual activity: Not on file  Other Topics Concern  . Not on file  Social History Narrative  . Not on file    Family History: Not available on first evaluation.  Will be updated post cath. The patient's family history is not on file.    ROS:  Please see the history of present illness.  Review of Systems - Negative except Symptoms noted in HPI.  Also gastroparesis, abdominal pain Endocrine ROS: Intermittent hypoglycemia episodes Gastrointestinal ROS: positive for - abdominal pain, constipation and nausea/vomiting negative for - appetite loss, blood in stools, change in stools, diarrhea, hematemesis, melena or stool incontinence Musculoskeletal ROS: positive for - Left shoulder pain All other ROS reviewed and negative.     Physical Exam/Data:   Vitals:   10/15/17 2110 10/15/17 2115 10/15/17 2125 10/15/17 2130  BP: (!) 153/96 (!) 152/93 (!) 148/91 (!) 154/91  Pulse: 88 86 87 84  Resp: 12 11 14 (!) 8  SpO2: 100% 97% 98% 98%    Intake/Output Summary (Last 24 hours) at 10/15/2017 2226 Last data filed at 10/15/2017 1521 Gross per 24 hour  Intake 1000 ml  Output 650 ml  Net 350 ml   There were no vitals filed for this visit. There is no height or weight on file to calculate BMI.  General:  Well nourished, well developed, in mild to moderate distress; pleasant mood and affect HEENT: NCAT, EOMI. normal OP.  Mallampati 2 Lymph: no adenopathy Neck: no carotid bruit or JVD Endocrine:  No thryomegaly Vascular: No carotid bruits;  FA pulses 2+ bilaterally without bruits  Cardiac:  normal S1, S2; RRR; no M/R/G.  Although cannot exclude soft S4 gallop. Lungs:  clear to auscultation bilaterally, no wheezing, rhonchi or rales  Abd: soft, nontender, no hepatomegaly  Ext: no clubbing cyanosis or edema Musculoskeletal:  No deformities, BUE and BLE strength normal and equal Skin: warm and dry  Neuro:  CNs 2-12 intact, no focal abnormalities noted Psych:  Normal affect   EKG:  The ECG that was done at 2017 hrs.  was personally reviewed and demonstrates sinus rhythm with borderline right atrial enlargement.  Likely LVH with lateral T wave inversions subtle depressions.  Also subtle ST depressions in II, III and aVF.  No elevations noted besides repolarization changes in V2 and V3.  Relevant CV Studies: Second troponin level roughly 5  Laboratory Data:  Chemistry Recent Labs  Lab 10/12/17 1140 10/15/17 1340  NA   129* 135  K 3.1* 3.5  CL 96* 104  CO2 24 19*  GLUCOSE 292* 295*  BUN 21* 17  CREATININE 1.27* 1.24  CALCIUM 8.7* 9.2  GFRNONAA >60 >60  GFRAA >60 >60  ANIONGAP 9 12    Recent Labs  Lab 10/12/17 1140 10/15/17 1340  PROT 7.0 7.5  ALBUMIN 3.8 4.3  AST 31 28  ALT 23 20  ALKPHOS 80 85  BILITOT 1.2 0.7   Hematology Recent Labs  Lab 10/12/17 1140 10/15/17 1340  WBC 7.1 8.8  RBC 4.53 4.45  HGB 14.3 14.3  HCT 37.7* 37.4*  MCV 83.2 84.0  MCH 31.6 32.1  MCHC 37.9* 38.2*  RDW 11.7 11.8  PLT 201 219   Cardiac Enzymes Recent Labs  Lab 10/15/17 1340 10/15/17 1646 10/15/17 1931  TROPONINI 0.11* 1.48* 5.56*   No results for input(s): TROPIPOC in the last 168 hours.  BNPNo results for input(s): BNP, PROBNP in the last 168 hours.  DDimer No results for input(s): DDIMER in the last 168 hours.  Radiology/Studies:  Dg Chest 2 View  Result Date: 10/15/2017 CLINICAL DATA:  Cough.  Chest pain EXAM: CHEST  2 VIEW COMPARISON:  10/12/2017.  06/03/2016. FINDINGS: Heart size is normal. Mediastinal  shadows are normal. Question mild patchy infiltrate in the left lower lobe seen on the frontal view. Not confirmed on the lateral view. No dense consolidation, volume loss or effusion. No bone abnormality. IMPRESSION: No active disease versus hazy infiltrate left lower lobe. Electronically Signed   By: Nelson Chimes M.D.   On: 10/15/2017 13:31   Dg Abd 2 Views  Result Date: 10/15/2017 CLINICAL DATA:  Abdominal pain with nausea and vomiting EXAM: ABDOMEN - 2 VIEW COMPARISON:  Abdomen series October 12, 2017 FINDINGS: Supine and upright images obtained. There are multiple air-fluid levels throughout the abdomen. No appreciable bowel dilatation. No free air. Lung bases clear. No abnormal calcifications. IMPRESSION: Multiple air-fluid levels without appreciable bowel dilatation. Suspect a degree of ileus or enteritis, although a degree of partial bowel obstruction is possible. Note that there is moderate air in the colon. No free air. Lung bases clear. Electronically Signed   By: Lowella Grip III M.D.   On: 10/15/2017 14:19    Assessment and Plan:  Initial plan: Patient presenting with urgent non-ST elevation MI, ongoing chest pain.  We will taken to the cardiac catheterization lab for ischemic evaluation.  Further plans based on results.   1. Non-ST elevation MI --taken to the Cath Lab, found to have multivessel CAD.    Review of films with interventional colleagues and consider CT surgery consult in a.m.  restart IV heparin 6 hours after TR band placement.  Check 2D echocardiogram the morning  Aspirin, High-dose statin -- Check lipid panel in the morning 2. Hypertensive emergency (no CHF)-restart IV nitroglycerin drip.    Continue home dose of ACE inhibitor and add carvedilol.    As needed labetalol/hydralazine 3. Diabetes mellitus, type II -on Levemir insulin at home.    Will add sliding scale insulin  He has gastroparesis.  We will continue Reglan  If glycemic control becomes  difficult, may need to consider consulting diabetes team   Severity of Illness: The appropriate patient status for this patient is INPATIENT. Inpatient status is judged to be reasonable and necessary in order to provide the required intensity of service to ensure the patient's safety. The patient's presenting symptoms, physical exam findings, and initial radiographic and laboratory data in the context of  their chronic comorbidities is felt to place them at high risk for further clinical deterioration. Furthermore, it is not anticipated that the patient will be medically stable for discharge from the hospital within 2 midnights of admission. The following factors support the patient status of inpatient.   " The patient's presenting symptoms include ongoing chest pain, significant hypertension, nausea.  Significant risk factors.. " The worrisome physical exam findings include appears to be in some distress.. " The initial radiographic and laboratory data are worrisome because of EKG with subtle potential ischemic changes.  Also troponin elevation..  Former smoker " The chronic co-morbidities include hypertension, hyperlipidemia and diabetes..   * I certify that at the point of admission it is my clinical judgment that the patient will require inpatient hospital care spanning beyond 2 midnights from the point of admission due to high intensity of service, high risk for further deterioration and high frequency of surveillance required.*    For questions or updates, please contact [facility] Please consult [email or web address] for contact info under Cardiology/STEMI.     Signed, David Harding, MD  10/15/2017 10:26 PM   

## 2017-10-15 NOTE — ED Notes (Signed)
Upon rounding pt is lethargic, but still follows commands. Pupils pinpoint on exam.

## 2017-10-15 NOTE — ED Notes (Signed)
Troponin result of 5.56 given to Dr Eudelia Bunchardama and Primary RN Florentina AddisonKatie.

## 2017-10-15 NOTE — ED Triage Notes (Signed)
C/o CP x today-states he was seen here last week for abd pain, n/v/d that has cont'd

## 2017-10-15 NOTE — ED Provider Notes (Signed)
Emergency Department Provider Note   I have reviewed the triage vital signs and the nursing notes.   HISTORY  Chief Complaint Chest Pain   HPI Matthew Fisher is a 47 y.o. male with PMH of DM, HTN, and arthritis resents the emergency department for evaluation of abdominal and chest pain with associated nausea, vomiting, diarrhea.  Patient was recently admitted to [facility] with acute kidney injury and hyperglycemia.  He was treated and discharged.  During that admission he had an upper endoscopy with esophageal stricture dilation and was diagnosed with esophagitis.  He was discharged home with medication for acid reflux when symptoms returned.  He was evaluated in the emergency department 2 days ago at which time he improved with therapy and went home with nausea medication.  Patient's wife states he improved until today when he began having vomiting and diarrhea again followed by chest tightness and belly discomfort.  No radiation of symptoms or modifying factors.  No blood in the emesis.  No dyspnea.    Past Medical History:  Diagnosis Date  . Arthritis   . Diabetes mellitus without complication ([facility])   . Gout   . Hypertension     Patient Active Problem List   Diagnosis Date Noted  . Medial meniscus tear 01/22/2013    Past Surgical History:  Procedure Laterality Date  . FRACTURE SURGERY     right ring finger  . KNEE ARTHROSCOPY WITH LATERAL MENISECTOMY Left 01/22/2013   Procedure: LEFT KNEE ARTHROSCOPY DEBRIDEMENT CHONDROPLASTY, LATERAL RELEASE AND partial medial meniscectomy;  Surgeon: Javier Docker, MD;  Location: [facility];  Service: Orthopedics;  Laterality: Left;  . KNEE SURGERY     "removed bone"    Current Outpatient Rx  . Order #: [id number] Class: Historical Med  . Order #: [id number] Class: Historical Med  . Order #: [id number] Class: Historical Med  . Order #: [id number] Class: Print  . Order #: [id number] Class: Print  . Order #: [id number] Class:  Historical Med  . Order #: [id number] Class: Print    Allergies Patient has no known allergies.  No family history on file.  Social History Social History   Tobacco Use  . Smoking status: Former Smoker    Packs/day: 0.50    Years: 20.00    Pack years: 10.00    Types: Cigarettes    Last attempt to quit: 10/08/2002    Years since quitting: 15.0  . Smokeless tobacco: Never Used  Substance Use Topics  . Alcohol use: Yes    Comment: occasional  . Drug use: No    Review of Systems  Constitutional: No fever/chills Eyes: No visual changes. ENT: No sore throat. Cardiovascular: Positive chest pain. Respiratory: Denies shortness of breath. Gastrointestinal: Positive abdominal pain. Positive nausea, vomiting, and diarrhea.  No constipation. Genitourinary: Negative for dysuria. Musculoskeletal: Negative for back pain. Skin: Negative for rash. Neurological: Negative for headaches, focal weakness or numbness.  10-point ROS otherwise negative.  ____________________________________________   PHYSICAL EXAM:  VITAL SIGNS: ED Triage Vitals  Enc Vitals Group     BP 10/15/17 1312 (!) 204/124     Pulse Rate 10/15/17 1312 86     Resp 10/15/17 1312 20     Temp --      Temp src --      SpO2 10/15/17 1312 100 %     Pain Score 10/15/17 1309 10   Constitutional: Alert and oriented. Appears uncomfortable.  Eyes: Conjunctivae are normal. Head: Atraumatic. Nose: No congestion/rhinnorhea. Mouth/Throat: Mucous membranes are dry.  Neck: No stridor.  Cardiovascular: Normal rate, regular rhythm. Good peripheral circulation. Grossly normal heart sounds.   Respiratory: Normal respiratory effort.  No retractions. Lungs CTAB. Gastrointestinal: Soft with diffuse mild tenderness. No distention.  Musculoskeletal: No lower extremity tenderness nor edema. No gross deformities of extremities. Neurologic:  Normal speech and language. No gross focal neurologic deficits are appreciated.  Skin:  Skin  is warm, dry and intact. No rash noted.  ____________________________________________   LABS (all labs ordered are listed, but only abnormal results are displayed)  Labs Reviewed  CBC - Abnormal; Notable for the following components:      Result Value   HCT 37.4 (*)    MCHC 38.2 (*)    All other components within normal limits  TROPONIN I - Abnormal; Notable for the following components:   Troponin I 0.11 (*)    All other components within normal limits  COMPREHENSIVE METABOLIC PANEL - Abnormal; Notable for the following components:   CO2 19 (*)    Glucose, Bld 295 (*)    All other components within normal limits  LIPASE, BLOOD  TROPONIN I  TROPONIN I   ____________________________________________  EKG   EKG Interpretation  Date/Time:  Tuesday October 15 2017 13:09:29 EST Ventricular Rate:  102 PR Interval:  146 QRS Duration: 90 QT Interval:  356 QTC Calculation: 463 R Axis:   84 Text Interpretation:  Sinus tachycardia Biatrial enlargement ST & T wave abnormality, consider lateral ischemia Abnormal ECG ST changes slightly worse but appreciated on prior tracing. No STEMI.  Confirmed by Alona BeneLong, Nataleigh Griffin [phone]) on 10/15/2017 1:18:46 PM       ____________________________________________  RADIOLOGY  Dg Chest 2 View  Result Date: 10/15/2017 CLINICAL DATA:  Cough.  Chest pain EXAM: CHEST  2 VIEW COMPARISON:  10/12/2017.  06/03/2016. FINDINGS: Heart size is normal. Mediastinal shadows are normal. Question mild patchy infiltrate in the left lower lobe seen on the frontal view. Not confirmed on the lateral view. No dense consolidation, volume loss or effusion. No bone abnormality. IMPRESSION: No active disease versus hazy infiltrate left lower lobe. Electronically Signed   By: Paulina FusiMark  Shogry M.D.   On: 10/15/2017 13:31   Dg Abd 2 Views  Result Date: 10/15/2017 CLINICAL DATA:  Abdominal pain with nausea and vomiting EXAM: ABDOMEN - 2 VIEW COMPARISON:  Abdomen series October 12, 2017  FINDINGS: Supine and upright images obtained. There are multiple air-fluid levels throughout the abdomen. No appreciable bowel dilatation. No free air. Lung bases clear. No abnormal calcifications. IMPRESSION: Multiple air-fluid levels without appreciable bowel dilatation. Suspect a degree of ileus or enteritis, although a degree of partial bowel obstruction is possible. Note that there is moderate air in the colon. No free air. Lung bases clear. Electronically Signed   By: Bretta BangWilliam  Woodruff III M.D.   On: 10/15/2017 14:19    ____________________________________________   PROCEDURES  Procedure(s) performed:   .Critical Care Performed by: Maia PlanLong, Laurene Melendrez G, MD Authorized by: Maia PlanLong, Ruben Pyka G, MD   Critical care provider statement:    Critical care time (minutes):  45   Critical care time was exclusive of:  Separately billable procedures and treating other patients and teaching time   Critical care was necessary to treat or prevent imminent or life-threatening deterioration of the following conditions:  Circulatory failure and cardiac failure   Critical care was time spent personally by me on the following activities:  Blood draw for specimens, development of treatment plan with patient or surrogate, discussions with consultants, evaluation  of patient's response to treatment, examination of patient, obtaining history from patient or surrogate, ordering and performing treatments and interventions, ordering and review of laboratory studies, ordering and review of radiographic studies, pulse oximetry, re-evaluation of patient's condition and review of old charts   I assumed direction of critical care for this patient from another provider in my specialty: no       ____________________________________________   INITIAL IMPRESSION / ASSESSMENT AND PLAN / ED COURSE  Pertinent labs & imaging results that were available during my care of the patient were reviewed by me and considered in my medical  decision making (see chart for details).  Patient appears uncomfortable on my initial exam.  He has diffuse abdominal discomfort without rebound or guarding.  His EKG shows ST changes which are more pronounced than normal but I suspect this is due to his associated tachycardia.  Review of prior tracing shows similar ST depressions.  Plan for troponin, plain film of the chest and abdomen, pain and nausea medication, and reassessment after labs.  Extremely low suspicion for vascular etiology of the patient's pain.  He had a CT scan of the abdomen performed the beginning of this month prior to his admission at [facility].   02:45 PM Patient with improved CP and abdominal pain. Troponin elevated to 0.11. Hyperglycemia but no DKA. Plan for ASA, Heparin, and Nitro ointment. Repeat EKG ordered to ensure no changes. Consulting Cardiology for disposition plan.   Spoke with Dr. Duke Salvia with Cardiology who reviewed the EKG. No STEMI. With elevated BP would hold on Heparin until BP in the SBP 160s range. Labetalol, Nitro, and ASA given. Unclear at this time if this is HTN emergency vs NSTEMI. Trend troponin.   Discussed patient's case with Cardiology, Dr. Duke Salvia to request admission. Patient and family (if present) updated with plan. Care transferred to Cardiology service.  I reviewed all nursing notes, vitals, pertinent old records, EKGs, labs, imaging (as available).  ____________________________________________  FINAL CLINICAL IMPRESSION(S) / ED DIAGNOSES  Final diagnoses:  Abdominal pain  Precordial chest pain     MEDICATIONS GIVEN DURING THIS VISIT:  Medications  0.9 %  sodium chloride infusion (not administered)  morphine 4 MG/ML injection 4 mg (4 mg Intravenous Given 10/15/17 1359)  metoCLOPramide (REGLAN) injection 10 mg (10 mg Intravenous Given 10/15/17 1357)  sodium chloride 0.9 % bolus 1,000 mL (0 mLs Intravenous Stopped 10/15/17 1520)  aspirin chewable tablet 324 mg (324 mg  Oral Given 10/15/17 1500)  nitroGLYCERIN (NITROGLYN) 2 % ointment 1 inch (1 inch Topical Given 10/15/17 1500)  labetalol (NORMODYNE,TRANDATE) injection 10 mg (10 mg Intravenous Given 10/15/17 1518)    Note:  This document was prepared using Dragon voice recognition software and may include unintentional dictation errors.  Alona Bene, MD Emergency Medicine    Jerrye Seebeck, Arlyss Repress, MD 10/15/17 [phone]

## 2017-10-15 NOTE — ED Notes (Addendum)
NTG paste removed from RT side chest

## 2017-10-15 NOTE — Progress Notes (Signed)
ANTICOAGULATION CONSULT NOTE  Pharmacy Consult for heparin Indication: chest pain/ACS  Heparin Dosing Weight: 88 kg  Labs: Recent Labs    10/15/17 1340 10/15/17 1646 10/15/17 1931  HGB 14.3  --   --   HCT 37.4*  --   --   PLT 219  --   --   CREATININE 1.24  --   --   TROPONINI 0.11* 1.48* 5.56*    Assessment: 46 yom presenting with CP. Pharmacy consulted to dose heparin for ACS. Not on anticoagulation PTA. CBC wnl, troponin 0.11>5.56, no bleed documented.  Goal of Therapy:  Heparin level 0.3-0.7 units/ml Monitor platelets by anticoagulation protocol: Yes   Plan:  Heparin 4000 unit bolus Start heparin at 1200 units/h 6h heparin level Daily heparin level/CBC Monitor s/sx bleeding  Babs BertinHaley Kelce Bouton, PharmD, BCPS Clinical Pharmacist 10/15/2017 8:24 PM

## 2017-10-15 NOTE — ED Provider Notes (Signed)
I assumed care of this patient from Dr. Jacqulyn BathLong at 1600.  Please see their note for further details of Hx, PE.  Briefly patient is a 47 y.o. male who presented with nausea vomiting chest pain found to have ST depression on EKG.  Initial troponin was mildly elevated.  Case was discussed with cardiology and patient will be admitted to their service.  They requested that his blood pressure be titrated down to below systolics of 160 before heparin was initiated.  Patient was given labetalol.  Current plan is to reassess patient and manage as needed.  Patient started on nitroglycerin drip due to blood pressure.  Subsequent troponins continue to titrate up.  Patient was started on heparin drip.  Discussed case with cardiology and given his persistent pain with EKG changes.  Patient was taken urgently to the Cath Lab for NSTEMI.  CRITICAL CARE Performed by: Amadeo GarnetPedro Eduardo Lavoris Sparling Total critical care time: 30 minutes Critical care time was exclusive of separately billable procedures and treating other patients. Critical care was necessary to treat or prevent imminent or life-threatening deterioration. Critical care was time spent personally by me on the following activities: development of treatment plan with patient and/or surrogate as well as nursing, discussions with consultants, evaluation of patient's response to treatment, examination of patient, obtaining history from patient or surrogate, ordering and performing treatments and interventions, ordering and review of laboratory studies, ordering and review of radiographic studies, pulse oximetry and re-evaluation of patient's condition.     Nira Connardama, Emerick Weatherly Eduardo, MD 10/15/17 2147

## 2017-10-15 NOTE — ED Notes (Signed)
Heparin verified with Denice BorsAdam Brown, RN

## 2017-10-15 NOTE — ED Notes (Signed)
Patient transported to X-ray 

## 2017-10-16 ENCOUNTER — Other Ambulatory Visit: Payer: Self-pay

## 2017-10-16 ENCOUNTER — Inpatient Hospital Stay (HOSPITAL_COMMUNITY): Payer: PRIVATE HEALTH INSURANCE | Admitting: Anesthesiology

## 2017-10-16 ENCOUNTER — Inpatient Hospital Stay (HOSPITAL_COMMUNITY): Payer: PRIVATE HEALTH INSURANCE

## 2017-10-16 ENCOUNTER — Encounter (HOSPITAL_COMMUNITY): Payer: Self-pay | Admitting: Cardiology

## 2017-10-16 ENCOUNTER — Encounter (HOSPITAL_COMMUNITY)
Admission: EM | Disposition: A | Discharge status: Home / Self Care | Payer: Self-pay | Source: Home / Self Care | Attending: Cardiology

## 2017-10-16 DIAGNOSIS — I214 Non-ST elevation (NSTEMI) myocardial infarction: Secondary | ICD-10-CM | POA: Diagnosis not present

## 2017-10-16 DIAGNOSIS — I2510 Atherosclerotic heart disease of native coronary artery without angina pectoris: Secondary | ICD-10-CM | POA: Diagnosis not present

## 2017-10-16 LAB — GLUCOSE, CAPILLARY
GLUCOSE-CAPILLARY: 159 mg/dL — AB (ref 65–99)
GLUCOSE-CAPILLARY: 145 mg/dL — AB (ref 65–99)
Glucose-Capillary: 135 mg/dL — ABNORMAL HIGH (ref 65–99)
Glucose-Capillary: 201 mg/dL — ABNORMAL HIGH (ref 65–99)
Glucose-Capillary: 223 mg/dL — ABNORMAL HIGH (ref 65–99)

## 2017-10-16 LAB — CBC
HEMATOCRIT: 33.3 % — AB (ref 39.0–52.0)
HEMOGLOBIN: 12.1 g/dL — AB (ref 13.0–17.0)
MCH: 30.7 pg (ref 26.0–34.0)
MCHC: 36.3 g/dL — ABNORMAL HIGH (ref 30.0–36.0)
MCV: 84.5 fL (ref 78.0–100.0)
PLATELETS: 214 10*3/uL (ref 150–400)
RBC: 3.94 MIL/uL — ABNORMAL LOW (ref 4.22–5.81)
RDW: 11.7 % (ref 11.5–15.5)
WBC: 7.1 10*3/uL (ref 4.0–10.5)

## 2017-10-16 LAB — ECHOCARDIOGRAM COMPLETE
CHL CUP DOP CALC LVOT VTI: 13.5 cm
CHL CUP MV DEC (S): 282
E decel time: 282 msec
E/e' ratio: 8.85
FS: 30 % (ref 28–44)
HEIGHTINCHES: 68 in
IV/PV OW: 0.82
LA ID, A-P, ES: 36 mm
LA diam end sys: 36 mm
LA diam index: 1.79 cm/m2
LA vol: 55.2 mL
LAVOLA4C: 46 mL
LAVOLIN: 27.5 mL/m2
LDCA: 3.8 cm2
LV E/e' medial: 8.85
LV E/e'average: 8.85
LV PW d: 13.6 mm — AB (ref 0.6–1.1)
LV TDI E'MEDIAL: 6.42
LVOT SV: 51 mL
LVOTD: 22 mm
LVOTPV: 81.3 cm/s
MV pk A vel: 55.8 m/s
MV pk E vel: 56.8 m/s
RV LATERAL S' VELOCITY: 16 cm/s
RV TAPSE: 24.4 mm
WEIGHTICAEL: 2927.71 [oz_av]

## 2017-10-16 LAB — POCT ACTIVATED CLOTTING TIME: Activated Clotting Time: 164 seconds

## 2017-10-16 LAB — LIPID PANEL
CHOL/HDL RATIO: 3.8 ratio
Cholesterol: 111 mg/dL (ref 0–200)
HDL: 29 mg/dL — AB (ref >40)
LDL Cholesterol: 63 mg/dL (ref 0–99)
Triglycerides: 94 mg/dL (ref <150)
VLDL: 19 mg/dL (ref 0–40)

## 2017-10-16 LAB — COMPREHENSIVE METABOLIC PANEL
ALT: 20 U/L (ref 17–63)
ANION GAP: 8 (ref 5–15)
AST: 51 U/L — ABNORMAL HIGH (ref 15–41)
Albumin: 3.1 g/dL — ABNORMAL LOW (ref 3.5–5.0)
Alkaline Phosphatase: 71 U/L (ref 38–126)
BUN: 13 mg/dL (ref 6–20)
CO2: 21 mmol/L — AB (ref 22–32)
CREATININE: 1.22 mg/dL (ref 0.61–1.24)
Calcium: 7.9 mg/dL — ABNORMAL LOW (ref 8.9–10.3)
Chloride: 104 mmol/L (ref 101–111)
Glucose, Bld: 247 mg/dL — ABNORMAL HIGH (ref 65–99)
POTASSIUM: 3.8 mmol/L (ref 3.5–5.1)
SODIUM: 133 mmol/L — AB (ref 135–145)
Total Bilirubin: 1.1 mg/dL (ref 0.3–1.2)
Total Protein: 5.8 g/dL — ABNORMAL LOW (ref 6.5–8.1)

## 2017-10-16 LAB — HEPARIN LEVEL (UNFRACTIONATED): Heparin Unfractionated: 0.16 IU/mL — ABNORMAL LOW (ref 0.30–0.70)

## 2017-10-16 LAB — TROPONIN I
TROPONIN I: 7.83 ng/mL — AB (ref <0.03)
TROPONIN I: 8.17 ng/mL — AB (ref <0.03)
Troponin I: 6.22 ng/mL (ref <0.03)

## 2017-10-16 LAB — HEMOGLOBIN A1C
HEMOGLOBIN A1C: 8.6 % — AB (ref 4.8–5.6)
Mean Plasma Glucose: 200.12 mg/dL

## 2017-10-16 LAB — MRSA PCR SCREENING: MRSA BY PCR: NEGATIVE

## 2017-10-16 SURGERY — CORONARY ARTERY BYPASS GRAFTING (CABG)
Anesthesia: General | Site: Chest

## 2017-10-16 MED ORDER — ENSURE ENLIVE PO LIQD
237.0000 mL | Freq: Two times a day (BID) | ORAL | Status: DC
Start: 2017-10-16 — End: 2017-10-19
  Administered 2017-10-17: 237 mL via ORAL
  Filled 2017-10-16 (×7): qty 237

## 2017-10-16 MED ORDER — ASPIRIN 81 MG PO CHEW
81.0000 mg | CHEWABLE_TABLET | ORAL | Status: AC
  Administered 2017-10-17: 81 mg via ORAL
  Filled 2017-10-16: qty 1

## 2017-10-16 MED ORDER — TRANEXAMIC ACID (OHS) BOLUS VIA INFUSION
15.0000 mg/kg | INTRAVENOUS | Status: DC
  Filled 2017-10-16: qty 1298

## 2017-10-16 MED ORDER — DEXTROSE 5 % IV SOLN
750.0000 mg | INTRAVENOUS | Status: DC
  Filled 2017-10-16: qty 750

## 2017-10-16 MED ORDER — NITROGLYCERIN IN D5W 200-5 MCG/ML-% IV SOLN
0.0000 ug/min | Freq: Once | INTRAVENOUS | Status: DC
  Filled 2017-10-16: qty 250

## 2017-10-16 MED ORDER — TICAGRELOR 90 MG PO TABS
90.0000 mg | ORAL_TABLET | Freq: Two times a day (BID) | ORAL | Status: DC
  Administered 2017-10-17 – 2017-10-18 (×3): 90 mg via ORAL
  Filled 2017-10-16 (×4): qty 1

## 2017-10-16 MED ORDER — SODIUM CHLORIDE 0.9% FLUSH: 3.0000 mL | INTRAVENOUS | Status: DC | PRN

## 2017-10-16 MED ORDER — SODIUM CHLORIDE 0.9% FLUSH
3.0000 mL | Freq: Two times a day (BID) | INTRAVENOUS | Status: DC
  Administered 2017-10-16: 3 mL via INTRAVENOUS

## 2017-10-16 MED ORDER — MILRINONE LACTATE IN DEXTROSE 20-5 MG/100ML-% IV SOLN
0.1250 ug/kg/min | INTRAVENOUS | Status: DC
  Filled 2017-10-16: qty 100

## 2017-10-16 MED ORDER — ORAL CARE MOUTH RINSE
15.0000 mL | Freq: Two times a day (BID) | OROMUCOSAL | Status: DC
  Administered 2017-10-16: 15 mL via OROMUCOSAL

## 2017-10-16 MED ORDER — DOPAMINE-DEXTROSE 3.2-5 MG/ML-% IV SOLN
0.0000 ug/kg/min | INTRAVENOUS | Status: DC
  Filled 2017-10-16: qty 250

## 2017-10-16 MED ORDER — EPINEPHRINE PF 1 MG/ML IJ SOLN
0.0000 ug/min | INTRAVENOUS | Status: DC
  Filled 2017-10-16: qty 4

## 2017-10-16 MED ORDER — FENTANYL CITRATE (PF) 250 MCG/5ML IJ SOLN
INTRAMUSCULAR | Status: AC
  Filled 2017-10-16: qty 20

## 2017-10-16 MED ORDER — SODIUM CHLORIDE 0.9 % IV SOLN: 250.0000 mL | INTRAVENOUS | Status: DC | PRN

## 2017-10-16 MED ORDER — HEPARIN (PORCINE) IN NACL 100-0.45 UNIT/ML-% IJ SOLN
1500.0000 [IU]/h | INTRAMUSCULAR | Status: DC
  Administered 2017-10-16: 1200 [IU]/h via INTRAVENOUS
  Filled 2017-10-16: qty 250

## 2017-10-16 MED ORDER — SODIUM CHLORIDE 0.9 % WEIGHT BASED INFUSION
3.0000 mL/kg/h | INTRAVENOUS | Status: DC
  Administered 2017-10-17: 3 mL/kg/h via INTRAVENOUS

## 2017-10-16 MED ORDER — NITROGLYCERIN IN D5W 200-5 MCG/ML-% IV SOLN
0.0000 ug/min | Freq: Once | INTRAVENOUS | Status: AC
  Administered 2017-10-16: 30 ug/min via INTRAVENOUS

## 2017-10-16 MED ORDER — SODIUM CHLORIDE 0.9 % IV SOLN
INTRAVENOUS | Status: DC
  Filled 2017-10-16: qty 1

## 2017-10-16 MED ORDER — PLASMA-LYTE 148 IV SOLN
INTRAVENOUS | Status: DC
  Filled 2017-10-16: qty 2.5

## 2017-10-16 MED ORDER — HEPARIN (PORCINE) IN NACL 100-0.45 UNIT/ML-% IJ SOLN
1500.0000 [IU]/h | INTRAMUSCULAR | Status: DC
  Administered 2017-10-16: 1500 [IU]/h via INTRAVENOUS
  Filled 2017-10-16: qty 250

## 2017-10-16 MED ORDER — SODIUM CHLORIDE 0.9 % WEIGHT BASED INFUSION: 1.0000 mL/kg/h | INTRAVENOUS | Status: DC

## 2017-10-16 MED ORDER — PROPOFOL 10 MG/ML IV BOLUS
INTRAVENOUS | Status: AC
  Filled 2017-10-16: qty 20

## 2017-10-16 MED ORDER — MIDAZOLAM HCL 10 MG/2ML IJ SOLN
INTRAMUSCULAR | Status: AC
  Filled 2017-10-16: qty 2

## 2017-10-16 MED ORDER — NITROGLYCERIN IN D5W 200-5 MCG/ML-% IV SOLN
2.0000 ug/min | INTRAVENOUS | Status: DC
  Filled 2017-10-16: qty 250

## 2017-10-16 MED ORDER — SODIUM CHLORIDE 0.9 % IV SOLN
30.0000 ug/min | INTRAVENOUS | Status: DC
  Filled 2017-10-16: qty 2

## 2017-10-16 MED ORDER — VANCOMYCIN HCL 10 G IV SOLR
1500.0000 mg | INTRAVENOUS | Status: DC
  Filled 2017-10-16: qty 1500

## 2017-10-16 MED ORDER — TICAGRELOR 90 MG PO TABS
180.0000 mg | ORAL_TABLET | Freq: Once | ORAL | Status: AC
  Administered 2017-10-16: 180 mg via ORAL
  Filled 2017-10-16: qty 2

## 2017-10-16 MED ORDER — TRANEXAMIC ACID 1000 MG/10ML IV SOLN
1.5000 mg/kg/h | INTRAVENOUS | Status: DC
  Filled 2017-10-16: qty 25

## 2017-10-16 MED ORDER — POTASSIUM CHLORIDE 2 MEQ/ML IV SOLN
80.0000 meq | INTRAVENOUS | Status: DC
  Filled 2017-10-16: qty 40

## 2017-10-16 MED ORDER — SODIUM CHLORIDE 0.9 % IV SOLN
INTRAVENOUS | Status: DC
  Filled 2017-10-16: qty 30

## 2017-10-16 MED ORDER — DEXTROSE 5 % IV SOLN
1.5000 g | INTRAVENOUS | Status: DC
  Filled 2017-10-16: qty 1.5

## 2017-10-16 MED ORDER — HEPARIN BOLUS VIA INFUSION
2500.0000 [IU] | Freq: Once | INTRAVENOUS | Status: AC
Start: 2017-10-16 — End: 2017-10-16
  Administered 2017-10-16: 2500 [IU] via INTRAVENOUS
  Filled 2017-10-16: qty 2500

## 2017-10-16 MED ORDER — DEXMEDETOMIDINE HCL IN NACL 400 MCG/100ML IV SOLN
0.1000 ug/kg/h | INTRAVENOUS | Status: DC
  Filled 2017-10-16: qty 100

## 2017-10-16 MED ORDER — TRANEXAMIC ACID (OHS) PUMP PRIME SOLUTION
2.0000 mg/kg | INTRAVENOUS | Status: DC
  Filled 2017-10-16: qty 1.73

## 2017-10-16 MED ORDER — MAGNESIUM SULFATE 50 % IJ SOLN
40.0000 meq | INTRAMUSCULAR | Status: DC
  Filled 2017-10-16 (×2): qty 9.85

## 2017-10-16 MED ORDER — NITROGLYCERIN IN D5W 200-5 MCG/ML-% IV SOLN: 0.0000 ug/min | INTRAVENOUS | Status: AC

## 2017-10-16 MED FILL — Nitroglycerin IV Soln 100 MCG/ML in D5W: INTRA_ARTERIAL | Qty: 10 | Status: AC

## 2017-10-16 NOTE — Progress Notes (Signed)
  Echocardiogram 2D Echocardiogram has been performed.  [name], Matthew Fisher 10/16/2017, 11:25 AM

## 2017-10-16 NOTE — H&P (View-Only) (Signed)
Progress Note  Patient Name: Matthew Fisher Date of Encounter: 10/16/2017  Primary Cardiologist: No primary care provider on file.   Subjective   Feeling ok. No dyspnea. Minimal chest pain.   Inpatient Medications    Scheduled Meds: . aspirin  81 mg Oral Daily  . atorvastatin  80 mg Oral q1800  . carvedilol  6.25 mg Oral BID WC  . feeding supplement (ENSURE ENLIVE)  237 mL Oral BID BM  . insulin aspart  0-15 Units Subcutaneous TID WC  . insulin detemir  20 Units Subcutaneous Daily  . lisinopril  10 mg Oral Daily  . mouth rinse  15 mL Mouth Rinse BID  . sodium chloride flush  3 mL Intravenous Q12H   Continuous Infusions: . sodium chloride 250 mL (10/16/17 1200)  . heparin 1,200 Units/hr (10/16/17 1200)  . nitroGLYCERIN 130 mcg/min (10/16/17 1208)   PRN Meds: sodium chloride, acetaminophen, metoCLOPramide, morphine injection, ondansetron (ZOFRAN) IV, oxyCODONE-acetaminophen, sodium chloride flush   Vital Signs    Vitals:   10/16/17 1130 10/16/17 1142 10/16/17 1145 10/16/17 1200  BP: 107/66  101/65 104/63  Pulse: 79  80 76  Resp: 15  18 16   Temp:  98.1 F (36.7 C)    TempSrc:  Oral    SpO2: 98%  99% 99%  Weight:      Height:        Intake/Output Summary (Last 24 hours) at 10/16/2017 1251 Last data filed at 10/16/2017 1200 Gross per 24 hour  Intake 2754.14 ml  Output 1250 ml  Net 1504.14 ml   Filed Weights   10/16/17 0011  Weight: 182 lb 15.7 oz (83 kg)    Telemetry    Normal sinus rhythm - Personally Reviewed   Physical Exam  Alert, oriented, in NAD GEN: No acute distress.   Neck: No JVD Cardiac: RRR, no murmurs, rubs, or gallops.  Respiratory: Clear to auscultation bilaterally. GI: Soft, nontender, non-distended  MS: No edema; No deformity. Right radial site ok.  Neuro:  Nonfocal  Psych: Normal affect   Labs    Chemistry Recent Labs  Lab 10/12/17 1140 10/15/17 1340 10/16/17 0027  NA 129* 135 133*  K 3.1* 3.5 3.8  CL 96* 104 104  CO2  24 19* 21*  GLUCOSE 292* 295* 247*  BUN 21* 17 13  CREATININE 1.27* 1.24 1.22  CALCIUM 8.7* 9.2 7.9*  PROT 7.0 7.5 5.8*  ALBUMIN 3.8 4.3 3.1*  AST 31 28 51*  ALT 23 20 20   ALKPHOS 80 85 71  BILITOT 1.2 0.7 1.1  GFRNONAA >60 >60 >60  GFRAA >60 >60 >60  ANIONGAP 9 12 8      Hematology Recent Labs  Lab 10/12/17 1140 10/15/17 1340 10/16/17 0524  WBC 7.1 8.8 7.1  RBC 4.53 4.45 3.94*  HGB 14.3 14.3 12.1*  HCT 37.7* 37.4* 33.3*  MCV 83.2 84.0 84.5  MCH 31.6 32.1 30.7  MCHC 37.9* 38.2* 36.3*  RDW 11.7 11.8 11.7  PLT 201 219 214    Cardiac Enzymes Recent Labs  Lab 10/15/17 1646 10/15/17 1931 10/16/17 0027 10/16/17 0524  TROPONINI 1.48* 5.56* 8.17* 7.83*   No results for input(s): TROPIPOC in the last 168 hours.   BNPNo results for input(s): BNP, PROBNP in the last 168 hours.   DDimer No results for input(s): DDIMER in the last 168 hours.   Radiology    Dg Chest 2 View  Result Date: 10/15/2017 CLINICAL DATA:  Cough.  Chest pain EXAM: CHEST  2  VIEW COMPARISON:  10/12/2017.  06/03/2016. FINDINGS: Heart size is normal. Mediastinal shadows are normal. Question mild patchy infiltrate in the left lower lobe seen on the frontal view. Not confirmed on the lateral view. No dense consolidation, volume loss or effusion. No bone abnormality. IMPRESSION: No active disease versus hazy infiltrate left lower lobe. Electronically Signed   By: Paulina Fusi M.D.   On: 10/15/2017 13:31   Dg Abd 2 Views  Result Date: 10/15/2017 CLINICAL DATA:  Abdominal pain with nausea and vomiting EXAM: ABDOMEN - 2 VIEW COMPARISON:  Abdomen series October 12, 2017 FINDINGS: Supine and upright images obtained. There are multiple air-fluid levels throughout the abdomen. No appreciable bowel dilatation. No free air. Lung bases clear. No abnormal calcifications. IMPRESSION: Multiple air-fluid levels without appreciable bowel dilatation. Suspect a degree of ileus or enteritis, although a degree of partial bowel  obstruction is possible. Note that there is moderate air in the colon. No free air. Lung bases clear. Electronically Signed   By: Bretta Bang III M.D.   On: 10/15/2017 14:19    Cardiac Studies   Echo 10-16-2016: Study Conclusions  - Left ventricle: The cavity size was mildly dilated. Wall   thickness was increased in a pattern of mild LVH. Systolic   function was normal. The estimated ejection fraction was in the   range of 55% to 60%. Wall motion was normal; there were no   regional wall motion abnormalities. Left ventricular diastolic   function parameters were normal. - Atrial septum: No defect or patent foramen ovale was identified.  Patient Profile     47 y.o. male with no past cardiac history presenting with non-STEMI and ongoing ischemic symptoms, taken directly for cardiac catheterization demonstrating multivessel coronary artery disease.  Assessment & Plan    1. NSTEMI 2. HTN 3. Type II DM  Films reviewed. Pt with diffuse diabetic CAD, but no high-grade stenosis in the left main or proximal LAD. Considering his relatively young age and lack of severe LAD stenosis, I would favor PCI as an initial strategy to manage his CAD and NSTEMI. Reviewed films with colleagues as well. The proximal circ/OM has a tight focal lesion and there is also a severe lesion involving the distal RCA bifurcation that is approachable with PCI. Will load with ticagrelor and schedule for PCI tomorrow with Dr Eldridge Dace. Risks, indications reviewed with patient and wife at bedside. All questions answered.    For questions or updates, please contact [facility] Please consult [email or web address] for contact info under Cardiology/STEMI.      Signed, Tonny Bollman, MD  10/16/2017, 12:51 PM

## 2017-10-16 NOTE — Progress Notes (Signed)
Progress Note  Patient Name: Matthew DuhamelRodney Fisher Date of Encounter: 10/16/2017  Primary Cardiologist: No primary care provider on file.   Subjective   Feeling ok. No dyspnea. Minimal chest pain.   Inpatient Medications    Scheduled Meds: . aspirin  81 mg Oral Daily  . atorvastatin  80 mg Oral q1800  . carvedilol  6.25 mg Oral BID WC  . feeding supplement (ENSURE ENLIVE)  237 mL Oral BID BM  . insulin aspart  0-15 Units Subcutaneous TID WC  . insulin detemir  20 Units Subcutaneous Daily  . lisinopril  10 mg Oral Daily  . mouth rinse  15 mL Mouth Rinse BID  . sodium chloride flush  3 mL Intravenous Q12H   Continuous Infusions: . sodium chloride 250 mL (10/16/17 1200)  . heparin 1,200 Units/hr (10/16/17 1200)  . nitroGLYCERIN 130 mcg/min (10/16/17 1208)   PRN Meds: sodium chloride, acetaminophen, metoCLOPramide, morphine injection, ondansetron (ZOFRAN) IV, oxyCODONE-acetaminophen, sodium chloride flush   Vital Signs    Vitals:   10/16/17 1130 10/16/17 1142 10/16/17 1145 10/16/17 1200  BP: 107/66  101/65 104/63  Pulse: 79  80 76  Resp: 15  18 16   Temp:  98.1 F (36.7 C)    TempSrc:  Oral    SpO2: 98%  99% 99%  Weight:      Height:        Intake/Output Summary (Last 24 hours) at 10/16/2017 1251 Last data filed at 10/16/2017 1200 Gross per 24 hour  Intake 2754.14 ml  Output 1250 ml  Net 1504.14 ml   Filed Weights   10/16/17 0011  Weight: 182 lb 15.7 oz (83 kg)    Telemetry    Normal sinus rhythm - Personally Reviewed   Physical Exam  Alert, oriented, in NAD GEN: No acute distress.   Neck: No JVD Cardiac: RRR, no murmurs, rubs, or gallops.  Respiratory: Clear to auscultation bilaterally. GI: Soft, nontender, non-distended  MS: No edema; No deformity. Right radial site ok.  Neuro:  Nonfocal  Psych: Normal affect   Labs    Chemistry Recent Labs  Lab 10/12/17 1140 10/15/17 1340 10/16/17 0027  NA 129* 135 133*  K 3.1* 3.5 3.8  CL 96* 104 104  CO2  24 19* 21*  GLUCOSE 292* 295* 247*  BUN 21* 17 13  CREATININE 1.27* 1.24 1.22  CALCIUM 8.7* 9.2 7.9*  PROT 7.0 7.5 5.8*  ALBUMIN 3.8 4.3 3.1*  AST 31 28 51*  ALT 23 20 20   ALKPHOS 80 85 71  BILITOT 1.2 0.7 1.1  GFRNONAA >60 >60 >60  GFRAA >60 >60 >60  ANIONGAP 9 12 8      Hematology Recent Labs  Lab 10/12/17 1140 10/15/17 1340 10/16/17 0524  WBC 7.1 8.8 7.1  RBC 4.53 4.45 3.94*  HGB 14.3 14.3 12.1*  HCT 37.7* 37.4* 33.3*  MCV 83.2 84.0 84.5  MCH 31.6 32.1 30.7  MCHC 37.9* 38.2* 36.3*  RDW 11.7 11.8 11.7  PLT 201 219 214    Cardiac Enzymes Recent Labs  Lab 10/15/17 1646 10/15/17 1931 10/16/17 0027 10/16/17 0524  TROPONINI 1.48* 5.56* 8.17* 7.83*   No results for input(s): TROPIPOC in the last 168 hours.   BNPNo results for input(s): BNP, PROBNP in the last 168 hours.   DDimer No results for input(s): DDIMER in the last 168 hours.   Radiology    Dg Chest 2 View  Result Date: 10/15/2017 CLINICAL DATA:  Cough.  Chest pain EXAM: CHEST  2  VIEW COMPARISON:  10/12/2017.  06/03/2016. FINDINGS: Heart size is normal. Mediastinal shadows are normal. Question mild patchy infiltrate in the left lower lobe seen on the frontal view. Not confirmed on the lateral view. No dense consolidation, volume loss or effusion. No bone abnormality. IMPRESSION: No active disease versus hazy infiltrate left lower lobe. Electronically Signed   By: Paulina Fusi M.D.   On: 10/15/2017 13:31   Dg Abd 2 Views  Result Date: 10/15/2017 CLINICAL DATA:  Abdominal pain with nausea and vomiting EXAM: ABDOMEN - 2 VIEW COMPARISON:  Abdomen series October 12, 2017 FINDINGS: Supine and upright images obtained. There are multiple air-fluid levels throughout the abdomen. No appreciable bowel dilatation. No free air. Lung bases clear. No abnormal calcifications. IMPRESSION: Multiple air-fluid levels without appreciable bowel dilatation. Suspect a degree of ileus or enteritis, although a degree of partial bowel  obstruction is possible. Note that there is moderate air in the colon. No free air. Lung bases clear. Electronically Signed   By: Bretta Bang III M.D.   On: 10/15/2017 14:19    Cardiac Studies   Echo 10-16-2016: Study Conclusions  - Left ventricle: The cavity size was mildly dilated. Wall   thickness was increased in a pattern of mild LVH. Systolic   function was normal. The estimated ejection fraction was in the   range of 55% to 60%. Wall motion was normal; there were no   regional wall motion abnormalities. Left ventricular diastolic   function parameters were normal. - Atrial septum: No defect or patent foramen ovale was identified.  Patient Profile     47 y.o. male with no past cardiac history presenting with non-STEMI and ongoing ischemic symptoms, taken directly for cardiac catheterization demonstrating multivessel coronary artery disease.  Assessment & Plan    1. NSTEMI 2. HTN 3. Type II DM  Films reviewed. Pt with diffuse diabetic CAD, but no high-grade stenosis in the left main or proximal LAD. Considering his relatively young age and lack of severe LAD stenosis, I would favor PCI as an initial strategy to manage his CAD and NSTEMI. Reviewed films with colleagues as well. The proximal circ/OM has a tight focal lesion and there is also a severe lesion involving the distal RCA bifurcation that is approachable with PCI. Will load with ticagrelor and schedule for PCI tomorrow with Dr Eldridge Dace. Risks, indications reviewed with patient and wife at bedside. All questions answered.    For questions or updates, please contact [facility] Please consult [email or web address] for contact info under Cardiology/STEMI.      Signed, Tonny Bollman, MD  10/16/2017, 12:51 PM

## 2017-10-16 NOTE — Progress Notes (Signed)
ANTICOAGULATION CONSULT NOTE - Initial Consult  Pharmacy Consult for Heparin Indication: CAD  No Known Allergies  Patient Measurements: Height: 5\' 8"  (172.7 cm) Weight: 182 lb 15.7 oz (83 kg) IBW/kg (Calculated) : 68.4  Vital Signs: BP: 128/76 (01/09 0000) Pulse Rate: 79 (01/09 0011)  Labs: Recent Labs    10/15/17 1340 10/15/17 1646 10/15/17 1931  HGB 14.3  --   --   HCT 37.4*  --   --   PLT 219  --   --   CREATININE 1.24  --   --   TROPONINI 0.11* 1.48* 5.56*    Estimated Creatinine Clearance: 76.5 mL/min (by C-G formula based on SCr of 1.24 mg/dL).   Medical History: Past Medical History:  Diagnosis Date  . Arthritis   . Diabetes mellitus without complication (HCC)   . Gout   . Hypertension     Medications:  Medications Prior to Admission  Medication Sig Dispense Refill Last Dose  . indomethacin (INDOCIN) 50 MG capsule Take 50 mg by mouth 3 (three) times a week.    2 weeks ago  . insulin detemir (LEVEMIR) 100 UNIT/ML injection Inject 20 Units into the skin daily.   01/21/2013 at PM  . lisinopril (PRINIVIL,ZESTRIL) 10 MG tablet Take 10 mg by mouth daily.     . metoCLOPramide (REGLAN) 10 MG tablet Take 1 tablet (10 mg total) by mouth every 6 (six) hours. 8 tablet 0   . oxyCODONE-acetaminophen (PERCOCET) 5-325 MG per tablet Take 1-2 tablets by mouth every 4 (four) hours as needed for pain. 40 tablet 0   . oxyCODONE-acetaminophen (PERCOCET/ROXICET) 5-325 MG per tablet Take 1 tablet by mouth every 4 (four) hours as needed for pain.   1 month   . promethazine (PHENERGAN) 25 MG suppository Place 1 suppository (25 mg total) rectally every 6 (six) hours as needed for nausea or vomiting. 6 each 0     Assessment: 47 y.o. male with NSTEMI s/p cath, found to have multivessel CAD and awaiting possible CABG, for heparin.  Heparin to start 8 hours after Radial sheath removed.  Goal of Therapy:  Heparin level 0.3-0.7 units/ml Monitor platelets by anticoagulation protocol:  Yes   Plan:  Start heparin 1200 units/hr at 0730 Check heparin level in 8 hours.   Eddie Candlebbott, Shane Badeaux Vernon 10/16/2017,12:22 AM

## 2017-10-16 NOTE — Progress Notes (Signed)
Initial Nutrition Assessment  DOCUMENTATION CODES:   Not applicable  INTERVENTION:  1. Ensure Enlive po BID, each supplement provides 350 kcal and 20 grams of protein  NUTRITION DIAGNOSIS:   Inadequate oral intake related to nausea, vomiting, decreased appetite as evidenced by per patient/family report, percent weight loss.  GOAL:   Patient will meet greater than or equal to 90% of their needs  MONITOR:   PO intake, I & O's, Labs, Weight trends, Supplement acceptance  REASON FOR ASSESSMENT:   Malnutrition Screening Tool    ASSESSMENT:   Matthew DuhamelRodney Fisher is a 47 yo male with PMH HTN, DM, recently had shoulder surgery in August 2018, recently admitted to [facility] with GI symptoms. Presents with NSTEMI, troponin of 5, taken to cath lab and found to have multivessel CAD, hypertensive emergency on nitro drip.  Spoke with Matthew Fisher and Matthew Fisher at bedside. He reports a UBW of 210 pounds, now down to 187 pounds. States he lost this weight over 2 weeks with poor PO intake, nausea, and vomiting. Was unable to keep anything down.Per chart he exhibits a 28%/13% severe weight loss. Was admitted to [facility] with these symptoms 2 weeks ago, believed to be viral gastroenteritis. Had oatmeal and pancakes this morning but Matthew pancakes were cold so he only ate the oatmeal. Reported some mild nausea. Normally eats french toast and bacon or bacon and eggs for breakfast, a couple hot dogs for lunch and a cooked meal with meat, starch, and vegetables for dinner.  Labs reviewed:  CBGs 201, 223 Na 133  Medications reviewed and include:  Insulin Nitro gtt   Intake/Output Summary (Last 24 hours) at 10/16/2017 1109 Last data filed at 10/16/2017 1000 Gross per 24 hour  Intake 2614.54 ml  Output 1250 ml  Net 1364.54 ml   NUTRITION - FOCUSED PHYSICAL EXAM:    Most Recent Value  Orbital Region  No depletion  Upper Arm Region  No depletion  Thoracic and Lumbar Region  No  depletion  Buccal Region  Mild depletion  Temple Region  No depletion  Clavicle Bone Region  No depletion  Clavicle and Acromion Bone Region  No depletion  Scapular Bone Region  Mild depletion  Dorsal Hand  No depletion  Patellar Region  Mild depletion  Anterior Thigh Region  Mild depletion  Posterior Calf Region  No depletion  Edema (RD Assessment)  None  Hair  Reviewed  Eyes  Reviewed  Mouth  Reviewed  Skin  Reviewed  Nails  Reviewed       Diet Order:  Diet heart healthy/carb modified Room service appropriate? Yes; Fluid consistency: Thin  EDUCATION NEEDS:   No education needs have been identified at this time  Skin:  Skin Assessment: Reviewed RN Assessment  Last BM:  PTA  Height:   Ht Readings from Last 1 Encounters:  10/16/17 5\' 8"  (1.727 m)    Weight:   Wt Readings from Last 1 Encounters:  10/16/17 182 lb 15.7 oz (83 kg)    Ideal Body Weight:  70 kg  BMI:  Body mass index is 27.82 kg/m.  Estimated Nutritional Needs:   Kcal:  2012-2348 calories (MSJ x1.2-1.4)  Protein:  107-124 grams  Fluid:  Per MD  Dionne AnoWilliam M. Abbiegail Landgren, MS, RD LDN Inpatient Clinical Dietitian Pager [phone]

## 2017-10-16 NOTE — Progress Notes (Signed)
RN attempted to begin removing air from TR band.  RN removed 3 cc and patient's right radial site began to bleed.  RN reinserted 3 cc of air.  RN will continue to monitor patient.

## 2017-10-16 NOTE — Progress Notes (Signed)
ANTICOAGULATION CONSULT NOTE - Follow up Consult  Pharmacy Consult for Heparin Indication: CAD  No Known Allergies  Patient Measurements: Height: 5\' 8"  (172.7 cm) Weight: 190 lb 11.2 oz (86.5 kg) IBW/kg (Calculated) : 68.4  Vital Signs: Temp: 98.5 F (36.9 C) (01/09 1651) Temp Source: Oral (01/09 1651) BP: 115/98 (01/09 1651) Pulse Rate: 73 (01/09 1651)  Labs: Recent Labs    10/15/17 1340  10/16/17 0027 10/16/17 0524 10/16/17 1142 10/16/17 1719  HGB 14.3  --   --  12.1*  --   --   HCT 37.4*  --   --  33.3*  --   --   PLT 219  --   --  214  --   --   HEPARINUNFRC  --   --   --   --   --  0.16*  CREATININE 1.24  --  1.22  --   --   --   TROPONINI 0.11*   < > 8.17* 7.83* 6.22*  --    < > = values in this interval not displayed.    Estimated Creatinine Clearance: 79.2 mL/min (by C-G formula based on SCr of 1.22 mg/dL).   Medical History: Past Medical History:  Diagnosis Date  . Arthritis   . Diabetes mellitus without complication (HCC)   . Gout   . Hypertension     Medications:  Medications Prior to Admission  Medication Sig Dispense Refill Last Dose  . CARAFATE 1 GM/10ML suspension Take 1 g by mouth 4 (four) times daily -  with meals and at bedtime.      . insulin detemir (LEVEMIR) 100 UNIT/ML injection Inject 20 Units into the skin daily.   01/21/2013 at PM  . labetalol (NORMODYNE) 200 MG tablet Take 200 mg by mouth 2 (two) times daily.     Marland Kitchen. lisinopril (PRINIVIL,ZESTRIL) 10 MG tablet Take 10 mg by mouth daily.     . metoCLOPramide (REGLAN) 10 MG tablet Take 1 tablet (10 mg total) by mouth every 6 (six) hours. 8 tablet 0   . promethazine (PHENERGAN) 25 MG suppository Place 1 suppository (25 mg total) rectally every 6 (six) hours as needed for nausea or vomiting. 6 each 0   . PROTONIX 40 MG tablet Take 40 mg by mouth 2 (two) times daily.       Assessment: 47 y.o. male with NSTEMI s/p cath, found to have multivessel CAD and plan for PCI in the morning. Pharmacy  consulted to start heparin.  Heparin level is SUBtherapeutic this evening at 0.16. Per RN, no problems with line or infusion.  Goal of Therapy:  Heparin level 0.3-0.7 units/ml Monitor platelets by anticoagulation protocol: Yes   Plan:  Increase heparin to 1500 units/hr Bolus heparin 2500 units Check anti-Xa level in 6 hours and daily while on heparin Continue to monitor H&H and platelets   Thank you for allowing us to participate in this patients care.  Signe Coltonya C Jermany Rimel, PharmD Clinical phone for 10/16/2017: [phone] If after 10:30p, please call main pharmacy at: [phone] 10/16/2017 7:04 PM

## 2017-10-17 ENCOUNTER — Encounter (HOSPITAL_COMMUNITY)
Admission: EM | Disposition: A | Discharge status: Home / Self Care | Payer: Self-pay | Source: Home / Self Care | Attending: Cardiology

## 2017-10-17 DIAGNOSIS — I2510 Atherosclerotic heart disease of native coronary artery without angina pectoris: Secondary | ICD-10-CM | POA: Diagnosis not present

## 2017-10-17 DIAGNOSIS — I214 Non-ST elevation (NSTEMI) myocardial infarction: Secondary | ICD-10-CM | POA: Diagnosis not present

## 2017-10-17 HISTORY — PX: LEFT HEART CATH: CATH118248

## 2017-10-17 HISTORY — PX: CORONARY STENT INTERVENTION: CATH118234

## 2017-10-17 LAB — HEPARIN LEVEL (UNFRACTIONATED): Heparin Unfractionated: 0.39 IU/mL (ref 0.30–0.70)

## 2017-10-17 LAB — GLUCOSE, CAPILLARY
GLUCOSE-CAPILLARY: 114 mg/dL — AB (ref 65–99)
Glucose-Capillary: 97 mg/dL (ref 65–99)
Glucose-Capillary: 115 mg/dL — ABNORMAL HIGH (ref 65–99)
Glucose-Capillary: 145 mg/dL — ABNORMAL HIGH (ref 65–99)

## 2017-10-17 LAB — POCT ACTIVATED CLOTTING TIME
ACTIVATED CLOTTING TIME: 285 s
ACTIVATED CLOTTING TIME: 290 s
Activated Clotting Time: 323 seconds

## 2017-10-17 LAB — PROTIME-INR
INR: 0.97
Prothrombin Time: 12.8 seconds (ref 11.4–15.2)

## 2017-10-17 LAB — CBC
HCT: 31.5 % — ABNORMAL LOW (ref 39.0–52.0)
Hemoglobin: 11.5 g/dL — ABNORMAL LOW (ref 13.0–17.0)
MCH: 31.5 pg (ref 26.0–34.0)
MCHC: 36.5 g/dL — ABNORMAL HIGH (ref 30.0–36.0)
MCV: 86.3 fL (ref 78.0–100.0)
PLATELETS: 207 10*3/uL (ref 150–400)
RBC: 3.65 MIL/uL — ABNORMAL LOW (ref 4.22–5.81)
RDW: 12.2 % (ref 11.5–15.5)
WBC: 7.8 10*3/uL (ref 4.0–10.5)

## 2017-10-17 SURGERY — CORONARY STENT INTERVENTION
Anesthesia: LOCAL

## 2017-10-17 MED ORDER — FENTANYL CITRATE (PF) 100 MCG/2ML IJ SOLN
INTRAMUSCULAR | Status: AC
  Filled 2017-10-17: qty 2

## 2017-10-17 MED ORDER — HEPARIN SODIUM (PORCINE) 1000 UNIT/ML IJ SOLN
INTRAMUSCULAR | Status: AC
  Filled 2017-10-17: qty 1

## 2017-10-17 MED ORDER — SODIUM CHLORIDE 0.9% FLUSH: 3.0000 mL | INTRAVENOUS | Status: DC | PRN

## 2017-10-17 MED ORDER — ONDANSETRON HCL 4 MG/2ML IJ SOLN
4.0000 mg | Freq: Four times a day (QID) | INTRAMUSCULAR | Status: DC | PRN
  Administered 2017-10-18: 4 mg via INTRAVENOUS
  Filled 2017-10-17: qty 2

## 2017-10-17 MED ORDER — VERAPAMIL HCL 2.5 MG/ML IV SOLN
INTRAVENOUS | Status: DC | PRN
  Administered 2017-10-17: 10 mL via INTRA_ARTERIAL

## 2017-10-17 MED ORDER — MIDAZOLAM HCL 2 MG/2ML IJ SOLN
INTRAMUSCULAR | Status: AC
  Filled 2017-10-17: qty 2

## 2017-10-17 MED ORDER — LABETALOL HCL 5 MG/ML IV SOLN: 10.0000 mg | INTRAVENOUS | Status: AC | PRN

## 2017-10-17 MED ORDER — NITROGLYCERIN 1 MG/10 ML FOR IR/CATH LAB
INTRA_ARTERIAL | Status: AC
  Filled 2017-10-17: qty 10

## 2017-10-17 MED ORDER — TICAGRELOR 90 MG PO TABS: 90.0000 mg | ORAL_TABLET | Freq: Two times a day (BID) | ORAL | Status: DC

## 2017-10-17 MED ORDER — SODIUM CHLORIDE 0.9 % IV SOLN: 250.0000 mL | INTRAVENOUS | Status: DC | PRN

## 2017-10-17 MED ORDER — VERAPAMIL HCL 2.5 MG/ML IV SOLN
INTRAVENOUS | Status: AC
  Filled 2017-10-17: qty 2

## 2017-10-17 MED ORDER — MIDAZOLAM HCL 2 MG/2ML IJ SOLN
INTRAMUSCULAR | Status: DC | PRN
  Administered 2017-10-17 (×2): 1 mg via INTRAVENOUS
  Administered 2017-10-17: 2 mg via INTRAVENOUS

## 2017-10-17 MED ORDER — IOPAMIDOL (ISOVUE-370) INJECTION 76%
INTRAVENOUS | Status: AC
  Filled 2017-10-17: qty 50

## 2017-10-17 MED ORDER — HEPARIN (PORCINE) IN NACL 2-0.9 UNIT/ML-% IJ SOLN
INTRAMUSCULAR | Status: AC | PRN
  Administered 2017-10-17: 1000 mL

## 2017-10-17 MED ORDER — PROTAMINE SULFATE 10 MG/ML IV SOLN
INTRAVENOUS | Status: AC
  Filled 2017-10-17: qty 25

## 2017-10-17 MED ORDER — SODIUM CHLORIDE 0.9 % IV SOLN: INTRAVENOUS | Status: AC

## 2017-10-17 MED ORDER — FENTANYL CITRATE (PF) 100 MCG/2ML IJ SOLN
INTRAMUSCULAR | Status: DC | PRN
  Administered 2017-10-17 (×3): 25 ug via INTRAVENOUS

## 2017-10-17 MED ORDER — HYDRALAZINE HCL 20 MG/ML IJ SOLN: 5.0000 mg | INTRAMUSCULAR | Status: AC | PRN

## 2017-10-17 MED ORDER — SODIUM CHLORIDE 0.9% FLUSH
3.0000 mL | Freq: Two times a day (BID) | INTRAVENOUS | Status: DC
  Administered 2017-10-17 – 2017-10-19 (×4): 3 mL via INTRAVENOUS

## 2017-10-17 MED ORDER — HEPARIN (PORCINE) IN NACL 2-0.9 UNIT/ML-% IJ SOLN
INTRAMUSCULAR | Status: AC
  Filled 2017-10-17: qty 500

## 2017-10-17 MED ORDER — IOPAMIDOL (ISOVUE-370) INJECTION 76%
INTRAVENOUS | Status: DC | PRN
  Administered 2017-10-17: 170 mL via INTRA_ARTERIAL

## 2017-10-17 MED ORDER — ASPIRIN 81 MG PO CHEW: 81.0000 mg | CHEWABLE_TABLET | Freq: Every day | ORAL | Status: DC

## 2017-10-17 MED ORDER — NITROGLYCERIN IN D5W 200-5 MCG/ML-% IV SOLN
INTRAVENOUS | Status: AC
  Filled 2017-10-17: qty 250

## 2017-10-17 MED ORDER — LIDOCAINE HCL (PF) 1 % IJ SOLN
INTRAMUSCULAR | Status: DC | PRN
  Administered 2017-10-17: 2 mL via INTRADERMAL

## 2017-10-17 MED ORDER — ANGIOPLASTY BOOK
Freq: Once | Status: AC
  Administered 2017-10-17: 1
  Filled 2017-10-17: qty 1

## 2017-10-17 MED ORDER — LIDOCAINE HCL (PF) 1 % IJ SOLN
INTRAMUSCULAR | Status: AC
  Filled 2017-10-17: qty 30

## 2017-10-17 MED ORDER — ACETAMINOPHEN 325 MG PO TABS
650.0000 mg | ORAL_TABLET | ORAL | Status: DC | PRN
  Administered 2017-10-17: 650 mg via ORAL
  Filled 2017-10-17: qty 2

## 2017-10-17 MED ORDER — NITROGLYCERIN 1 MG/10 ML FOR IR/CATH LAB
INTRA_ARTERIAL | Status: DC | PRN
  Administered 2017-10-17 (×2): 200 ug via INTRACORONARY
  Administered 2017-10-17: 100 ug via INTRACORONARY

## 2017-10-17 MED ORDER — IOPAMIDOL (ISOVUE-370) INJECTION 76%
INTRAVENOUS | Status: AC
  Filled 2017-10-17: qty 100

## 2017-10-17 MED ORDER — HEPARIN SODIUM (PORCINE) 1000 UNIT/ML IJ SOLN
INTRAMUSCULAR | Status: DC | PRN
  Administered 2017-10-17: 4000 [IU] via INTRAVENOUS
  Administered 2017-10-17: 9000 [IU] via INTRAVENOUS
  Administered 2017-10-17: 3000 [IU] via INTRAVENOUS
  Administered 2017-10-17: 2000 [IU] via INTRAVENOUS

## 2017-10-17 SURGICAL SUPPLY — 23 items
BALLN SAPPHIRE 2.0X12 (BALLOONS) ×2
BALLN SAPPHIRE 2.5X12 (BALLOONS) ×2
BALLN SAPPHIRE ~~LOC~~ 3.0X8 (BALLOONS) ×2 IMPLANT
BALLOON SAPPHIRE 2.0X12 (BALLOONS) ×1 IMPLANT
BALLOON SAPPHIRE 2.5X12 (BALLOONS) ×1 IMPLANT
CATH LAUNCHER 6FR EBU 3 (CATHETERS) ×2 IMPLANT
CATH LAUNCHER 6FR JR4 (CATHETERS) ×2 IMPLANT
DEVICE RAD COMP TR BAND LRG (VASCULAR PRODUCTS) ×2 IMPLANT
GLIDESHEATH SLEND SS 6F .021 (SHEATH) ×2 IMPLANT
GUIDEWIRE INQWIRE 1.5J.035X260 (WIRE) ×1 IMPLANT
INQWIRE 1.5J .035X260CM (WIRE) ×2
KIT ENCORE 26 ADVANTAGE (KITS) ×4 IMPLANT
KIT HEART LEFT (KITS) ×2 IMPLANT
PACK CARDIAC CATHETERIZATION (CUSTOM PROCEDURE TRAY) ×2 IMPLANT
STENT SYNERGY DES 2.25X12 (Permanent Stent) ×2 IMPLANT
STENT SYNERGY DES 2.5X12 (Permanent Stent) ×2 IMPLANT
STENT SYNERGY DES 2.75X12 (Permanent Stent) ×2 IMPLANT
STENT SYNERGY DES 3X16 (Permanent Stent) ×2 IMPLANT
TRANSDUCER W/STOPCOCK (MISCELLANEOUS) ×2 IMPLANT
TUBING CIL FLEX 10 FLL-RA (TUBING) ×2 IMPLANT
VALVE GUARDIAN II ~~LOC~~ HEMO (MISCELLANEOUS) ×2 IMPLANT
WIRE ASAHI PROWATER 180CM (WIRE) ×4 IMPLANT
WIRE HI TORQ BMW 190CM (WIRE) ×2 IMPLANT

## 2017-10-17 NOTE — Research (Signed)
AEGIS II research study protocol reviewed with patient and family. Questions encouraged and answered. ICF left for review. Research will follow up tomorrow morning. If patient interested will consent randomize and start infusion # 1 prior to discharge.

## 2017-10-17 NOTE — Progress Notes (Signed)
#   4.S/W DIANNA @ [facility] # [phone]   BRILINTA  90 MG BID  COVER- YES  CO-PAY- 100 % OF TOTAL COAST  PRIOR APPROVAL- NO  DEDUCTIBLE -NO   PATIENT PAY 100 % FOR ALL BRAND NAME MEDICATION   ALTERNATIVE :   1.CLOPIDOGREL 75 MG BID  COVER- YES  CO-PAY- $ 6.63   PRIOR APPROVAL- NO   2, CLOPIDOGREL 75 MG DAILY  COVER- YES  CO-PAY- $ 3.54   PRIOR APPROVAL- NO     PHARMACY : [facility]

## 2017-10-17 NOTE — Progress Notes (Signed)
TR BAND REMOVAL  LOCATION:    right radial  DEFLATED PER PROTOCOL:    Yes.    TIME BAND OFF / DRESSING APPLIED:    1515   SITE UPON ARRIVAL:    Level 0  SITE AFTER BAND REMOVAL:    Level 1( some puffiness  , soft)  CIRCULATION SENSATION AND MOVEMENT:    Within Normal Limits   Yes.    COMMENTS:   Tolerated procedure well

## 2017-10-17 NOTE — Progress Notes (Signed)
Progress Note  Patient Name: Matthew Fisher Date of Encounter: 10/17/2017  Primary Cardiologist: Herbie Baltimore   Subjective   Pt doing well this AM. No chest pain overnight. He remains anxious about his procedure today.   Inpatient Medications    Scheduled Meds: . aspirin  81 mg Oral Daily  . atorvastatin  80 mg Oral q1800  . carvedilol  6.25 mg Oral BID WC  . feeding supplement (ENSURE ENLIVE)  237 mL Oral BID BM  . insulin aspart  0-15 Units Subcutaneous TID WC  . insulin detemir  20 Units Subcutaneous Daily  . lisinopril  10 mg Oral Daily  . sodium chloride flush  3 mL Intravenous Q12H  . sodium chloride flush  3 mL Intravenous Q12H  . ticagrelor  90 mg Oral BID   Continuous Infusions: . sodium chloride 250 mL (10/16/17 1200)  . sodium chloride    . sodium chloride 1 mL/kg/hr (10/17/17 0659)  . heparin 1,500 Units/hr (10/16/17 2305)   PRN Meds: sodium chloride, sodium chloride, acetaminophen, metoCLOPramide, morphine injection, ondansetron (ZOFRAN) IV, oxyCODONE-acetaminophen, sodium chloride flush, sodium chloride flush   Vital Signs    Vitals:   10/16/17 2338 10/17/17 0535 10/17/17 0738 10/17/17 0805  BP: 130/65 (!) 141/83 139/84   Pulse: 78 83  79  Resp: 18     Temp: 98.3 F (36.8 C) 98 F (36.7 C) 99.2 F (37.3 C)   TempSrc: Oral Oral Oral   SpO2: 100% 100% 100%   Weight:  196 lb 13.9 oz (89.3 kg)    Height:        Intake/Output Summary (Last 24 hours) at 10/17/2017 0902 Last data filed at 10/17/2017 0649 Gross per 24 hour  Intake 914.3 ml  Output 300 ml  Net 614.3 ml   Filed Weights   10/16/17 0011 10/16/17 1643 10/17/17 0535  Weight: 182 lb 15.7 oz (83 kg) 190 lb 11.2 oz (86.5 kg) 196 lb 13.9 oz (89.3 kg)    Physical Exam   General: Well developed, well nourished, NAD Skin: Warm, dry, intact  Head: Normocephalic, atraumatic, clear, moist mucus membranes. Neck: Negative for carotid bruits. No JVD Lungs:Clear to ausculation bilaterally. No  wheezes, rales, or rhonchi. Breathing is unlabored. Cardiovascular: RRR with S1 S2. No murmurs, rubs, gallops, or LV heave appreciated. Abdomen: Soft, non-tender, non-distended with normoactive bowel sounds. No obvious abdominal masses. MSK: Strength and tone appear normal for age. 5/5 in all extremities Extremities: No edema. No clubbing or cyanosis. DP/PT pulses 2+ bilaterally Neuro: Alert and oriented. No focal deficits. No facial asymmetry. MAE spontaneously. Psych: Responds to questions appropriately with normal affect.    Labs    Chemistry Recent Labs  Lab 10/12/17 1140 10/15/17 1340 10/16/17 0027  NA 129* 135 133*  K 3.1* 3.5 3.8  CL 96* 104 104  CO2 24 19* 21*  GLUCOSE 292* 295* 247*  BUN 21* 17 13  CREATININE 1.27* 1.24 1.22  CALCIUM 8.7* 9.2 7.9*  PROT 7.0 7.5 5.8*  ALBUMIN 3.8 4.3 3.1*  AST 31 28 51*  ALT 23 20 20   ALKPHOS 80 85 71  BILITOT 1.2 0.7 1.1  GFRNONAA >60 >60 >60  GFRAA >60 >60 >60  ANIONGAP 9 12 8      Hematology Recent Labs  Lab 10/15/17 1340 10/16/17 0524 10/17/17 0259  WBC 8.8 7.1 7.8  RBC 4.45 3.94* 3.65*  HGB 14.3 12.1* 11.5*  HCT 37.4* 33.3* 31.5*  MCV 84.0 84.5 86.3  MCH 32.1 30.7 31.5  MCHC 38.2* 36.3* 36.5*  RDW 11.8 11.7 12.2  PLT 219 214 207    Cardiac Enzymes Recent Labs  Lab 10/15/17 1931 10/16/17 0027 10/16/17 0524 10/16/17 1142  TROPONINI 5.56* 8.17* 7.83* 6.22*   No results for input(s): TROPIPOC in the last 168 hours.    Radiology    Dg Chest 2 View  Result Date: 10/15/2017 CLINICAL DATA:  Cough.  Chest pain EXAM: CHEST  2 VIEW COMPARISON:  10/12/2017.  06/03/2016. FINDINGS: Heart size is normal. Mediastinal shadows are normal. Question mild patchy infiltrate in the left lower lobe seen on the frontal view. Not confirmed on the lateral view. No dense consolidation, volume loss or effusion. No bone abnormality. IMPRESSION: No active disease versus hazy infiltrate left lower lobe. Electronically Signed   By: Paulina Fusi M.D.   On: 10/15/2017 13:31   Dg Abd 2 Views  Result Date: 10/15/2017 CLINICAL DATA:  Abdominal pain with nausea and vomiting EXAM: ABDOMEN - 2 VIEW COMPARISON:  Abdomen series October 12, 2017 FINDINGS: Supine and upright images obtained. There are multiple air-fluid levels throughout the abdomen. No appreciable bowel dilatation. No free air. Lung bases clear. No abnormal calcifications. IMPRESSION: Multiple air-fluid levels without appreciable bowel dilatation. Suspect a degree of ileus or enteritis, although a degree of partial bowel obstruction is possible. Note that there is moderate air in the colon. No free air. Lung bases clear. Electronically Signed   By: Bretta Bang III M.D.   On: 10/15/2017 14:19    Telemetry    10/17/17 NSR 78- Personally Reviewed  ECG    10/16/17 NSR with T wave inversion in I, II, V5-V6- Personally Reviewed  Cardiac Studies   Echo 10/16/17:  Study Conclusions  - Left ventricle: The cavity size was mildly dilated. Wall   thickness was increased in a pattern of mild LVH. Systolic   function was normal. The estimated ejection fraction was in the   range of 55% to 60%. Wall motion was normal; there were no   regional wall motion abnormalities. Left ventricular diastolic   function parameters were normal. - Atrial septum: No defect or patent foramen ovale was identified  Cath 10/15/17:   Distal RCA bifurcation lesionS: Ost RPDA lesion is 50% stenosed. Post Atrio-1 lesion is 70% stenosed. Post Atrio-2 lesion is 95% stenosed.  Ost 1st Mrg lesion is 80% stenosed. Major bifurcating OM branch.  Prox Cx to Mid Cx lesion is 50% stenosed. Mid Cx to Dist Cx lesion is 90% stenosed.  Prox LAD to Mid LAD lesion is 60% stenosed. Mid LAD lesion is 70% stenosed. Dist LAD lesion is 100% stenosed.  Ost 1st Diag to 1st Diag lesion is 90% stenosed. Small moderate caliber major diagonal branch.  There is hyperdynamic left ventricular systolic function. The  left ventricular ejection fraction is greater than 65% by visual estimate.  LV end diastolic pressure is normal.  There is no mitral valve regurgitation.   Severe multivessel disease, possibly diabetic in nature.   The LAD itself is probably the least diseased vessel, however distally it is diffusely diseased and occludes. The major OM1 has a focal 70-80% stenosis, and the AV groove circumflex terminates with severe disease in multiple small bifurcations. There is bifurcation disease at the distal RCA into the RPDA and the RPAV with a more severe lesion in the proximal portion of the PADV that is a likely culprit lesion.  Given the extent of his CAD, the patient needs to be considered for bypass surgery  is an option with diabetes and multivessel disease.  Unfortunately, targets not great with exception of the PDA, PL and OM1.  Diagonal and LAD are decent graft targets.  Plan for now will be to admit the patient to the stepdown/ICU unit for maintaining stability with IV nitroglycerin and heparin.  We will discuss with interventional colleagues and likely consult CT surgery morning. Restart IV nitroglycerin for blood pressure control.  Add carvedilol to his home dose of ACE inhibitor. High-dose statin and aspirin.  Patient Profile     47 y.o. male with newly diagnosed multi-vessel CAD (NSTEMI) per cath report 10/15/17, HTN, and DM II [facility] with [facility]STEMI. initally presented for cath on 10/15/17 and found to have severe LAD stenosis, proximal circ/OM has a tight focal lesion and there is also a severe lesion involving the distal RCA bifurcation. CABG vs PCI considered. Taken back to cath lab 10/17/17 for intervention .    Assessment & Plan    1. NSTEMI: -For PCI intervention today with Dr. Eldridge DaceVaranasi. -Denies chest pain overnight, anxious -Troponin levels, 7.83>6.22 today -LAD, proximal LCx, and distal RCA lesion noted on cath report -ASA, BB, high dose statin for optimal  risk reduction  -Brilinta -Hep off on call to cath lab   2. HTN: -Stable, 139/84>141/83>130/65 -Continue current regimen  -Will continue to monitor for changes as needed   3. DM II: -SSI  -Will need close follow up with PCP for management, diet and medication education  -A1C 8.6  Signed, Georgie ChardJill McDaniel, NP  10/17/2017, 9:02 AM    For questions or updates, please contact   Please consult [email or web address] for contact info under Cardiology/STEMI.  Patient seen, examined. Available data reviewed. Agree with findings, assessment, and plan as outlined by Georgie ChardJill McDaniel, NP.  The patient is now status post multivessel PCI.  I reviewed his coronary angiography films.  The patient is currently doing well with no chest pain.  I agree with the findings and plan as above.  His heart is regular rate and rhythm, lungs are clear, there is no pretibial edema, and his right radial cath site is clear.  I would anticipate discharging him home tomorrow.  Tonny BollmanMichael Favor Hackler, M.D. 10/17/2017 2:04 PM

## 2017-10-17 NOTE — Interval H&P Note (Signed)
Cath Lab Visit (complete for each Cath Lab visit)  Clinical Evaluation Leading to the Procedure:   ACS: Yes.    Non-ACS:    Anginal Classification: CCS IV  Anti-ischemic medical therapy: Minimal Therapy (1 class of medications)  Non-Invasive Test Results: No non-invasive testing performed  Prior CABG: No previous CABG   Discussed with Dr. Excell Seltzerooper.  Plan for multivessel PCI of circ and RCA.  PDA supplies apex.  LAD is small towards the apex.    Stressed importance of DAPT.   History and Physical Interval Note:  10/17/2017 7:49 AM  Matthew Fisher  has presented today for surgery, with the diagnosis of cad  The various methods of treatment have been discussed with the patient and family. After consideration of risks, benefits and other options for treatment, the patient has consented to  Procedure(s): CORONARY STENT INTERVENTION (N/A) as a surgical intervention .  The patient's history has been reviewed, patient examined, no change in status, stable for surgery.  I have reviewed the patient's chart and labs.  Questions were answered to the patient's satisfaction.     Lance MussJayadeep Aritzel Krusemark

## 2017-10-18 ENCOUNTER — Encounter (HOSPITAL_COMMUNITY): Payer: Self-pay | Admitting: Interventional Cardiology

## 2017-10-18 DIAGNOSIS — I214 Non-ST elevation (NSTEMI) myocardial infarction: Secondary | ICD-10-CM | POA: Diagnosis not present

## 2017-10-18 DIAGNOSIS — N182 Chronic kidney disease, stage 2 (mild): Secondary | ICD-10-CM

## 2017-10-18 DIAGNOSIS — I2510 Atherosclerotic heart disease of native coronary artery without angina pectoris: Secondary | ICD-10-CM

## 2017-10-18 LAB — BASIC METABOLIC PANEL
Anion gap: 9 (ref 5–15)
BUN: 17 mg/dL (ref 6–20)
CHLORIDE: 100 mmol/L — AB (ref 101–111)
CO2: 26 mmol/L (ref 22–32)
Calcium: 8.6 mg/dL — ABNORMAL LOW (ref 8.9–10.3)
Creatinine, Ser: 1.42 mg/dL — ABNORMAL HIGH (ref 0.61–1.24)
GFR calc Af Amer: >60 mL/min (ref >60)
GFR calc non Af Amer: 57 mL/min — ABNORMAL LOW (ref >60)
Glucose, Bld: 219 mg/dL — ABNORMAL HIGH (ref 65–99)
Potassium: 3.5 mmol/L (ref 3.5–5.1)
SODIUM: 135 mmol/L (ref 135–145)

## 2017-10-18 LAB — GLUCOSE, CAPILLARY
GLUCOSE-CAPILLARY: 220 mg/dL — AB (ref 65–99)
Glucose-Capillary: 144 mg/dL — ABNORMAL HIGH (ref 65–99)
Glucose-Capillary: 103 mg/dL — ABNORMAL HIGH (ref 65–99)
Glucose-Capillary: 129 mg/dL — ABNORMAL HIGH (ref 65–99)

## 2017-10-18 LAB — CBC
HCT: 29.8 % — ABNORMAL LOW (ref 39.0–52.0)
HEMOGLOBIN: 10.6 g/dL — AB (ref 13.0–17.0)
MCH: 30.6 pg (ref 26.0–34.0)
MCHC: 35.6 g/dL (ref 30.0–36.0)
MCV: 86.1 fL (ref 78.0–100.0)
PLATELETS: 211 10*3/uL (ref 150–400)
RBC: 3.46 MIL/uL — ABNORMAL LOW (ref 4.22–5.81)
RDW: 12 % (ref 11.5–15.5)
WBC: 8.7 10*3/uL (ref 4.0–10.5)

## 2017-10-18 MED ORDER — AMLODIPINE BESYLATE 5 MG PO TABS
5.0000 mg | ORAL_TABLET | Freq: Once | ORAL | Status: AC
  Administered 2017-10-18: 14:00:00 5 mg via ORAL
  Filled 2017-10-18: qty 1

## 2017-10-18 MED ORDER — CARVEDILOL 12.5 MG PO TABS
12.5000 mg | ORAL_TABLET | Freq: Two times a day (BID) | ORAL | Status: DC
  Administered 2017-10-18: 17:00:00 12.5 mg via ORAL
  Filled 2017-10-18: qty 1

## 2017-10-18 MED ORDER — CLOPIDOGREL BISULFATE 75 MG PO TABS
300.0000 mg | ORAL_TABLET | Freq: Once | ORAL | Status: AC
  Administered 2017-10-18: 22:00:00 300 mg via ORAL
  Filled 2017-10-18: qty 4

## 2017-10-18 MED ORDER — CLOPIDOGREL BISULFATE 75 MG PO TABS
75.0000 mg | ORAL_TABLET | Freq: Every day | ORAL | Status: DC
  Administered 2017-10-19: 10:00:00 75 mg via ORAL
  Filled 2017-10-18: qty 1

## 2017-10-18 MED ORDER — CARVEDILOL 3.125 MG PO TABS
6.2500 mg | ORAL_TABLET | Freq: Once | ORAL | Status: AC
  Administered 2017-10-18: 6.25 mg via ORAL
  Filled 2017-10-18: qty 2

## 2017-10-18 MED ORDER — AMLODIPINE BESYLATE 5 MG PO TABS
5.0000 mg | ORAL_TABLET | Freq: Every day | ORAL | Status: DC
  Administered 2017-10-18: 5 mg via ORAL
  Filled 2017-10-18: qty 1

## 2017-10-18 MED ORDER — AMLODIPINE BESYLATE 10 MG PO TABS
10.0000 mg | ORAL_TABLET | Freq: Every day | ORAL | Status: DC
  Administered 2017-10-19: 10 mg via ORAL
  Filled 2017-10-18: qty 1

## 2017-10-18 NOTE — Progress Notes (Signed)
Progress Note  Patient Name: Matthew Fisher Date of Encounter: 10/18/2017  Primary Cardiologist: Dr. Herbie Baltimore  Subjective   Complaints of headache behind his eyes. Started after eating [place] bacon. Blood pressure in the 190s systolic. No Cp or SOB.  Inpatient Medications    Scheduled Meds: . aspirin  81 mg Oral Daily  . atorvastatin  80 mg Oral q1800  . carvedilol  6.25 mg Oral BID WC  . feeding supplement (ENSURE ENLIVE)  237 mL Oral BID BM  . insulin aspart  0-15 Units Subcutaneous TID WC  . insulin detemir  20 Units Subcutaneous Daily  . lisinopril  10 mg Oral Daily  . sodium chloride flush  3 mL Intravenous Q12H  . sodium chloride flush  3 mL Intravenous Q12H  . ticagrelor  90 mg Oral BID   Continuous Infusions: . sodium chloride 250 mL (10/16/17 1200)  . sodium chloride     PRN Meds: sodium chloride, sodium chloride, acetaminophen, metoCLOPramide, morphine injection, ondansetron (ZOFRAN) IV, oxyCODONE-acetaminophen, sodium chloride flush, sodium chloride flush   Vital Signs    Vitals:   10/17/17 1947 10/17/17 2000 10/18/17 0500 10/18/17 0700  BP: (!) 153/85 140/70 (!) 156/79 (!) 163/76  Pulse: 86 81 80 80  Resp: 15 17 14  (!) 26  Temp: 98.9 F (37.2 C)  98.3 F (36.8 C) 98 F (36.7 C)  TempSrc: Oral  Oral Oral  SpO2: 99% 98%  99%  Weight:      Height:        Intake/Output Summary (Last 24 hours) at 10/18/2017 0854 Last data filed at 10/18/2017 0700 Gross per 24 hour  Intake 600 ml  Output -  Net 600 ml   Filed Weights   10/16/17 0011 10/16/17 1643 10/17/17 0535  Weight: 182 lb 15.7 oz (83 kg) 190 lb 11.2 oz (86.5 kg) 196 lb 13.9 oz (89.3 kg)    Telemetry    NSR - Personally Reviewed  ECG    NSR with diffuse TW changes similar to prior - Personally Reviewed  Physical Exam   GEN: No acute distress. Curled up in dark room HEENT: Normocephalic, atraumatic, sclera non-icteric. Neck: No JVD or bruits. Cardiac: RRR no murmurs, rubs, or gallops.   Radials/DP/PT 1+ and equal bilaterally.  Respiratory: Clear to auscultation bilaterally. Breathing is unlabored. GI: Soft, nontender, non-distended, BS +x 4. MS: no deformity. Extremities: No clubbing or cyanosis. No edema. Distal pedal pulses are 2+ and equal bilaterally. Right radial cath site without hematoma or ecchymosis; good pulse. Neuro:  AAOx3. Follows commands. Psych:  Responds to questions appropriately with reserved affect.  Labs    Chemistry Recent Labs  Lab 10/12/17 1140 10/15/17 1340 10/16/17 0027 10/18/17 0430  NA 129* 135 133* 135  K 3.1* 3.5 3.8 3.5  CL 96* 104 104 100*  CO2 24 19* 21* 26  GLUCOSE 292* 295* 247* 219*  BUN 21* 17 13 17   CREATININE 1.27* 1.24 1.22 1.42*  CALCIUM 8.7* 9.2 7.9* 8.6*  PROT 7.0 7.5 5.8*  --   ALBUMIN 3.8 4.3 3.1*  --   AST 31 28 51*  --   ALT 23 20 20   --   ALKPHOS 80 85 71  --   BILITOT 1.2 0.7 1.1  --   GFRNONAA >60 >60 >60 57*  GFRAA >60 >60 >60 >60  ANIONGAP 9 12 8 9      Hematology Recent Labs  Lab 10/16/17 0524 10/17/17 0259 10/18/17 0430  WBC 7.1 7.8 8.7  RBC  3.94* 3.65* 3.46*  HGB 12.1* 11.5* 10.6*  HCT 33.3* 31.5* 29.8*  MCV 84.5 86.3 86.1  MCH 30.7 31.5 30.6  MCHC 36.3* 36.5* 35.6  RDW 11.7 12.2 12.0  PLT 214 207 211    Cardiac Enzymes Recent Labs  Lab 10/15/17 1931 10/16/17 0027 10/16/17 0524 10/16/17 1142  TROPONINI 5.56* 8.17* 7.83* 6.22*      Radiology    No results found.  Cardiac Studies   2D Echo 10/16/17 Study Conclusions  - Left ventricle: The cavity size was mildly dilated. Wall   thickness was increased in a pattern of mild LVH. Systolic   function was normal. The estimated ejection fraction was in the   range of 55% to 60%. Wall motion was normal; there were no   regional wall motion abnormalities. Left ventricular diastolic   function parameters were normal. - Atrial septum: No defect or patent foramen ovale was identified.  Patient Profile     47 y.o. male with HTN  and DM2 presented to [facility] on 10/15/17 with NSTEMI, found to have severe LAD stenosis, proximal circ/OM has a tight focal lesion and there is also a severe lesion involving the distal RCA bifurcation. CABG vs PCI considered, ultimately PCI chosen.  Assessment & Plan    1. NSTEMI/CAD - s/p multivessel PCI of the OM1, PDA, PLA ostium and mid PLA 10/17/16 - recommend DAPT at least one year, possibly indefinitely given extent of disease - continue ASA, BB, statin, Brilinta - LVEF 55-60%, mild LVH by echo  2. HTN: - BP moderately elevated this AM - hold ACEI given mild CKD - increase carvedilol to 12.5mg  BID and add amlodipine 5mg  daily - anticipate resuming ACEI as OP if Cr stabilizes  3. Uncontrolled DM II A1C 8.6: -Continue SSI and Levemir -Will need close follow up with PCP for management, diet and medication education   4. 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 Headache  - he reports this started after [place] bacon; also may be driven by BP so will work on this today - no focal neuro sx - got Percocet, also has PRN tylenol and morphine ordered if needed  For questions or updates, please contact [facility] Please consult [email or web address] for contact info under Cardiology/STEMI.  Signed, Laurann Montana, PA-C 10/18/2017, 8:54 AM    Patient seen, examined. Available data reviewed. Agree with findings, assessment, and plan as outlined by Ronie Spies, PA-C.  On exam, the patient is alert and oriented, in no distress but somewhat uncomfortable with a headache.  Lungs are clear.  Heart is regular rate and rhythm without murmur min is soft and nontender.  Radial cath site is clear.  There is no pretibial edema.  I reviewed the patient's coronary angios films from yesterday and he got an excellent PCI result with treatment of his circumflex and right coronary artery.  His procedure was uncomplicated.  His blood pressure is markedly  elevated today in the setting of a headache.  We will give him analgesic medication for his headache and adjust his antihypertensive regimen as detailed above with addition of amlodipine and increase of carvedilol.  We will reassess him in a few hours but I suspect he will be ready for discharge home later today. His medical program is reviewed and includes dual antiplatelet therapy with aspirin and ticagrelor, beta-blocker, high intensity statin drug, and an ACE inhibitor.  We will hold his ACE inhibitor  until we obtain a follow-up metabolic panel to make sure that his creatinine is stable.  Tonny BollmanMichael Ernan Runkles, M.D. 10/18/2017 9:41 AM

## 2017-10-18 NOTE — Care Management Note (Addendum)
Case Management Note  Patient Details  Name: Matthew DuhamelRodney Fisher MRN: [id number] Date of Birth: 08/28/1971  Subjective/Objective:   From home, pta indep, s/p stent intervention, will be on plavix per Annabelle Harmanana PA.  Patient having difficulty with bp and emesis today,Will increase amlodipine to 10mg  daily and keep in the hospital for today. Anticipate dc in am.                     Action/Plan: NCM will follow for dc needs.  Expected Discharge Date:                  Expected Discharge Plan:  Home/Self Care  In-House Referral:     Discharge planning Services  CM Consult  Post Acute Care Choice:    Choice offered to:     DME Arranged:    DME Agency:     HH Arranged:    HH Agency:     Status of Service:  Completed, signed off  If discussed at [employer] Length of Stay Meetings, dates discussed:    Additional Comments:  Leone Havenaylor, Alann Avey Clinton, RN 10/18/2017, 10:56 AM

## 2017-10-18 NOTE — Progress Notes (Addendum)
Pt has had difficulty with BP control throughout afternoon, also episode of emesis after walking with rehab. D/w Dr. Excell Seltzerooper. Will increase amlodipine to 10mg  daily and keep in the hospital for today. Anticipate home in AM if feeling better. Also of note Care management reported that Brilinta cost would be 100% copay. Per d/w Dr. Excell Seltzerooper, start Plavix tonight (300mg  load) -> 75mg  tomorrow.   F/u scheduled 10/28/17 at [facility] office, placed in f/u section on AVS ([facility] Dayna Dunn PA-C

## 2017-10-18 NOTE — Progress Notes (Signed)
CARDIAC REHAB PHASE I   PRE:  Rate/Rhythm: 79 SR    BP: sitting 177/89    SaO2: 100 RA  MODE:  Ambulation: 300 ft   POST:  Rate/Rhythm: 82 SR    BP: sitting 165/96     SaO2:   Pt feeling some better, HA 3/10, BP slowly decreasing. Able to walk without problems, to recliner. Ed completed with pt and girlfriend. Good reception. Understands importance of Plavix, DM control, ex, NTG, and CRPII. Pt admits he drinks regular sodas. Discussed DM at length and his potential for improvement. Will refer to [facility] CRPII. Also discussed nicotine cessation (smokes black and milds for boredom). He feels confident he will quit.   Pt sleepy in recliner toward end of ed. BP 195/96. Asked to go to bed. Upon lying down, pt began to vomit large amount. He had gotten MsO4 2 hours ago. Girlfriend sts it has been questioned if he has gastroparesis.   [id number]  Harriet MassonRandi Kristan Zebulun Deman CES, ACSM 10/18/2017 1:59 PM

## 2017-10-19 DIAGNOSIS — Z955 Presence of coronary angioplasty implant and graft: Secondary | ICD-10-CM | POA: Diagnosis not present

## 2017-10-19 DIAGNOSIS — I2510 Atherosclerotic heart disease of native coronary artery without angina pectoris: Secondary | ICD-10-CM | POA: Diagnosis not present

## 2017-10-19 DIAGNOSIS — I10 Essential (primary) hypertension: Secondary | ICD-10-CM | POA: Diagnosis not present

## 2017-10-19 DIAGNOSIS — I214 Non-ST elevation (NSTEMI) myocardial infarction: Secondary | ICD-10-CM | POA: Diagnosis not present

## 2017-10-19 DIAGNOSIS — N182 Chronic kidney disease, stage 2 (mild): Secondary | ICD-10-CM

## 2017-10-19 DIAGNOSIS — E118 Type 2 diabetes mellitus with unspecified complications: Secondary | ICD-10-CM | POA: Diagnosis not present

## 2017-10-19 DIAGNOSIS — I161 Hypertensive emergency: Secondary | ICD-10-CM | POA: Diagnosis not present

## 2017-10-19 DIAGNOSIS — Z794 Long term (current) use of insulin: Secondary | ICD-10-CM | POA: Diagnosis not present

## 2017-10-19 DIAGNOSIS — E1343 Other specified diabetes mellitus with diabetic autonomic (poly)neuropathy: Secondary | ICD-10-CM | POA: Diagnosis not present

## 2017-10-19 LAB — BASIC METABOLIC PANEL
Anion gap: 8 (ref 5–15)
BUN: 15 mg/dL (ref 6–20)
CHLORIDE: 100 mmol/L — AB (ref 101–111)
CO2: 26 mmol/L (ref 22–32)
Calcium: 8.4 mg/dL — ABNORMAL LOW (ref 8.9–10.3)
Creatinine, Ser: 1.32 mg/dL — ABNORMAL HIGH (ref 0.61–1.24)
GFR calc Af Amer: >60 mL/min (ref >60)
GFR calc non Af Amer: >60 mL/min (ref >60)
GLUCOSE: 179 mg/dL — AB (ref 65–99)
POTASSIUM: 3.5 mmol/L (ref 3.5–5.1)
Sodium: 134 mmol/L — ABNORMAL LOW (ref 135–145)

## 2017-10-19 LAB — CBC
HCT: 28.6 % — ABNORMAL LOW (ref 39.0–52.0)
HEMOGLOBIN: 10.7 g/dL — AB (ref 13.0–17.0)
MCH: 32.1 pg (ref 26.0–34.0)
MCHC: 37.4 g/dL — ABNORMAL HIGH (ref 30.0–36.0)
MCV: 85.9 fL (ref 78.0–100.0)
Platelets: 181 10*3/uL (ref 150–400)
RBC: 3.33 MIL/uL — AB (ref 4.22–5.81)
RDW: 12.2 % (ref 11.5–15.5)
WBC: 7.8 10*3/uL (ref 4.0–10.5)

## 2017-10-19 LAB — GLUCOSE, CAPILLARY: Glucose-Capillary: 175 mg/dL — ABNORMAL HIGH (ref 65–99)

## 2017-10-19 MED ORDER — CARVEDILOL 12.5 MG PO TABS
25.0000 mg | ORAL_TABLET | Freq: Two times a day (BID) | ORAL | Status: DC
  Filled 2017-10-19: qty 2

## 2017-10-19 MED ORDER — ATORVASTATIN CALCIUM 80 MG PO TABS: 80.0000 mg | ORAL_TABLET | Freq: Every day | ORAL | 0 refills | Status: DC

## 2017-10-19 MED ORDER — CARVEDILOL 12.5 MG PO TABS
25.0000 mg | ORAL_TABLET | Freq: Two times a day (BID) | ORAL | Status: DC
  Administered 2017-10-19: 25 mg via ORAL

## 2017-10-19 MED ORDER — CLOPIDOGREL BISULFATE 75 MG PO TABS: 75.0000 mg | ORAL_TABLET | Freq: Every day | ORAL | 11 refills | Status: DC

## 2017-10-19 MED ORDER — ASPIRIN 81 MG PO CHEW: 81.0000 mg | CHEWABLE_TABLET | Freq: Every day | ORAL | 11 refills | Status: AC

## 2017-10-19 MED ORDER — CARVEDILOL 25 MG PO TABS: 25.0000 mg | ORAL_TABLET | Freq: Two times a day (BID) | ORAL | 0 refills | Status: DC

## 2017-10-19 MED ORDER — AMLODIPINE BESYLATE 10 MG PO TABS: 10.0000 mg | ORAL_TABLET | Freq: Every day | ORAL | 0 refills | Status: DC

## 2017-10-19 NOTE — Progress Notes (Signed)
Progress Note  Patient Name: Matthew DuhamelRodney Fisher Date of Encounter: 10/19/2017  Primary Cardiologist: Herbie BaltimoreHarding  Subjective   No further headache. No chest pain or SOB. BP improving, though remains elevated in the 140s systolic. Notes daily diarrhea since 12/26. No further emesis. Post-cath renal function improving.   Inpatient Medications    Scheduled Meds: . amLODipine  10 mg Oral Daily  . aspirin  81 mg Oral Daily  . atorvastatin  80 mg Oral q1800  . carvedilol  12.5 mg Oral BID WC  . clopidogrel  75 mg Oral Daily  . feeding supplement (ENSURE ENLIVE)  237 mL Oral BID BM  . insulin aspart  0-15 Units Subcutaneous TID WC  . insulin detemir  20 Units Subcutaneous Daily  . sodium chloride flush  3 mL Intravenous Q12H  . sodium chloride flush  3 mL Intravenous Q12H   Continuous Infusions: . sodium chloride 250 mL (10/16/17 1200)  . sodium chloride     PRN Meds: sodium chloride, sodium chloride, acetaminophen, metoCLOPramide, morphine injection, ondansetron (ZOFRAN) IV, oxyCODONE-acetaminophen, sodium chloride flush, sodium chloride flush   Vital Signs    Vitals:   10/18/17 2000 10/18/17 2008 10/19/17 0508 10/19/17 0744  BP:  123/77 (!) 145/75 (!) 142/83  Pulse:  79 78 77  Resp: 14 17 18 11   Temp:  98.4 F (36.9 C) 98.4 F (36.9 C) 98 F (36.7 C)  TempSrc:  Oral Oral Oral  SpO2:   99% 98%  Weight:      Height:        Intake/Output Summary (Last 24 hours) at 10/19/2017 0830 Last data filed at 10/18/2017 2012 Gross per 24 hour  Intake -  Output 300 ml  Net -300 ml   Filed Weights   10/16/17 0011 10/16/17 1643 10/17/17 0535  Weight: 182 lb 15.7 oz (83 kg) 190 lb 11.2 oz (86.5 kg) 196 lb 13.9 oz (89.3 kg)    Telemetry    NSR, occasional PVCs - Personally Reviewed  ECG    n/a - Personally Reviewed  Physical Exam   GEN: No acute distress.   Neck: No JVD. Cardiac: RRR, no murmurs, rubs, or gallops. Right radial cath site without bleeding, bruising,  swelling, erythema, warmth, or TTP. Radial pulse 2+. Respiratory: Clear to auscultation bilaterally.  GI: Soft, nontender, non-distended.   MS: No edema; No deformity. Neuro:  Alert and oriented x 3; Nonfocal.  Psych: Normal affect.  Labs    Chemistry Recent Labs  Lab 10/12/17 1140 10/15/17 1340 10/16/17 0027 10/18/17 0430 10/19/17 0608  NA 129* 135 133* 135 134*  K 3.1* 3.5 3.8 3.5 3.5  CL 96* 104 104 100* 100*  CO2 24 19* 21* 26 26  GLUCOSE 292* 295* 247* 219* 179*  BUN 21* 17 13 17 15   CREATININE 1.27* 1.24 1.22 1.42* 1.32*  CALCIUM 8.7* 9.2 7.9* 8.6* 8.4*  PROT 7.0 7.5 5.8*  --   --   ALBUMIN 3.8 4.3 3.1*  --   --   AST 31 28 51*  --   --   ALT 23 20 20   --   --   ALKPHOS 80 85 71  --   --   BILITOT 1.2 0.7 1.1  --   --   GFRNONAA >60 >60 >60 57* >60  GFRAA >60 >60 >60 >60 >60  ANIONGAP 9 12 8 9 8      Hematology Recent Labs  Lab 10/16/17 0524 10/17/17 0259 10/18/17 0430  WBC 7.1 7.8  8.7  RBC 3.94* 3.65* 3.46*  HGB 12.1* 11.5* 10.6*  HCT 33.3* 31.5* 29.8*  MCV 84.5 86.3 86.1  MCH 30.7 31.5 30.6  MCHC 36.3* 36.5* 35.6  RDW 11.7 12.2 12.0  PLT 214 207 211    Cardiac Enzymes Recent Labs  Lab 10/15/17 1931 10/16/17 0027 10/16/17 0524 10/16/17 1142  TROPONINI 5.56* 8.17* 7.83* 6.22*   No results for input(s): TROPIPOC in the last 168 hours.   BNPNo results for input(s): BNP, PROBNP in the last 168 hours.   DDimer No results for input(s): DDIMER in the last 168 hours.   Radiology    No results found.  Cardiac Studies  LHC 10/15/2017:  Conclusion     Distal RCA bifurcation lesionS: Ost RPDA lesion is 50% stenosed. Post Atrio-1 lesion is 70% stenosed. Post Atrio-2 lesion is 95% stenosed.  Ost 1st Mrg lesion is 80% stenosed. Major bifurcating OM branch.  Prox Cx to Mid Cx lesion is 50% stenosed. Mid Cx to Dist Cx lesion is 90% stenosed.  Prox LAD to Mid LAD lesion is 60% stenosed. Mid LAD lesion is 70% stenosed. Dist LAD lesion is 100%  stenosed.  Ost 1st Diag to 1st Diag lesion is 90% stenosed. Small moderate caliber major diagonal branch.  There is hyperdynamic left ventricular systolic function. The left ventricular ejection fraction is greater than 65% by visual estimate.  LV end diastolic pressure is normal.  There is no mitral valve regurgitation.   Severe multivessel disease, possibly diabetic in nature.   The LAD itself is probably the least diseased vessel, however distally it is diffusely diseased and occludes. The major OM1 has a focal 70-80% stenosis, and the AV groove circumflex terminates with severe disease in multiple small bifurcations. There is bifurcation disease at the distal RCA into the RPDA and the RPAV with a more severe lesion in the proximal portion of the PADV that is a likely culprit lesion.  Given the extent of his CAD, the patient needs to be considered for bypass surgery is an option with diabetes and multivessel disease.  Unfortunately, targets not great with exception of the PDA, PL and OM1.  Diagonal and LAD are decent graft targets.  Plan for now will be to admit the patient to the stepdown/ICU unit for maintaining stability with IV nitroglycerin and heparin.  We will discuss with interventional colleagues and likely consult CT surgery morning. Restart IV nitroglycerin for blood pressure control.  Add carvedilol to his home dose of ACE inhibitor. High-dose statin and aspirin.  If he does go for bypass surgery, he would need to be on Plavix or Brilinta on discharge given this year extent of his disease.   TTE 10/16/2017: Study Conclusions  - Left ventricle: The cavity size was mildly dilated. Wall   thickness was increased in a pattern of mild LVH. Systolic   function was normal. The estimated ejection fraction was in the   range of 55% to 60%. Wall motion was normal; there were no   regional wall motion abnormalities. Left ventricular diastolic   function parameters were normal. -  Atrial septum: No defect or patent foramen ovale was identified.  LHC 10/17/2017: Coronary Findings   Diagnostic  Dominance: Right  Left Anterior Descending  Prox LAD to Mid LAD lesion 60% stenosed  Prox LAD to Mid LAD lesion is 60% stenosed. The lesion is located at the bend.  Mid LAD lesion 70% stenosed  Mid LAD lesion is 70% stenosed. The lesion is located at the  bend and eccentric.  Dist LAD lesion 100% stenosed  Dist LAD lesion is 100% stenosed. The lesion is chronically occluded. The vessel just seems to taper down and stop off prior to reaching the apex.  First Diagonal Branch  Vessel is moderate in size.  Ost 1st Diag to 1st Diag lesion 90% stenosed  Ost 1st Diag to 1st Diag lesion is 90% stenosed.  Lateral First Diagonal Branch  Vessel is small in size. There is severe disease in the vessel.  Second Diagonal Branch  Vessel is small in size.  Third Diagonal Branch  Vessel is small in size.  Left Circumflex  Vessel is large. There is moderate diffuse disease throughout the vessel. There is severe focal disease in the vessel.  Prox Cx to Mid Cx lesion 50% stenosed  Prox Cx to Mid Cx lesion is 50% stenosed. The lesion is located at the major branch.  Mid Cx to Dist Cx lesion 90% stenosed  Mid Cx to Dist Cx lesion is 90% stenosed. The lesion is segmental. Diffuse, irregular  First Obtuse Marginal Branch  Vessel is large in size. The distal circumflex terminates as a small caliber posterolateral branch. Prior to bifurcating, there is a small caliber side branch just after the lesion.  Ost 1st Mrg lesion 80% stenosed  Ost 1st Mrg lesion is 80% stenosed. The lesion is located proximal to the major branch, discrete and concentric.  Lateral First Obtuse Marginal Branch  Vessel is moderate in size. There is moderate diffuse disease in the vessel. This moderate caliber branch was off one proximal branchand then bifurcates into 2 small distal branches.  Second Obtuse Marginal Branch    Vessel is small in size. There is moderate diffuse disease in the vessel.  Right Coronary Artery  Acute Marginal Branch  Vessel is small in size. There is moderate disease in the vessel.  Right Posterior Descending Artery  Ost RPDA lesion 75% stenosed  Ost RPDA lesion is 75% stenosed. The lesion is tubular and eccentric.  Right Posterior Atrioventricular Branch  Vessel is moderate in size.  Post Atrio-1 lesion 70% stenosed  Post Atrio-1 lesion is 70% stenosed. The lesion is tubular and eccentric.  Post Atrio-2 lesion 70% stenosed  Post Atrio-2 lesion is 70% stenosed.  Post Atrio-3 lesion 95% stenosed  Post Atrio-3 lesion is 95% stenosed. Quite likely The lesion is eccentric and irregular.  Intervention   Ost 1st Mrg lesion  Stent  Lesion crossed with guidewire using a WIRE ASAHI PROWATER 180CM. Pre-stent angioplasty was performed using a BALLOON SAPPHIRE 2.5X12. A drug-eluting stent was successfully placed using a STENT SYNERGY DES 2.75X12. Stent strut is well apposed. Post-stent angioplasty was performed using a BALLOON SAPPHIRE [place] 3.0X8.  Post-Intervention Lesion Assessment  There is no residual stenosis post intervention.  Ost RPDA lesion  Stent  Lesion crossed with guidewire using a WIRE ASAHI PROWATER 180CM. Pre-stent angioplasty was not performed. A drug-eluting stent was successfully placed using a STENT SYNERGY DES 3X16. Stent strut is well apposed. Post-stent angioplasty was performed using a BALLOON SAPPHIRE [place] 3.0X8. PTCA of the PL was done with the 2.5 balloon before nad after PDA stent placement. THen, final kissing balloon inflation was performed. After the bifurcation treatment was performed, the second PLA stent was placed for the proximal edge dissection.  Post-Intervention Lesion Assessment  There is no residual stenosis post intervention.  Post Atrio-1 lesion  Angioplasty  using a BALLOON SAPPHIRE 2.5X12.  Post-Intervention Lesion Assessment  There is a 40%  residual stenosis post intervention.  Post Atrio-2 lesion  Stent  Lesion crossed with guidewire using a WIRE ASAHI PROWATER 180CM. Pre-stent angioplasty was not performed. A drug-eluting stent was successfully placed. Stent strut is well apposed. Stent overlaps previously placed stent. Post-stent angioplasty was not performed.  Post-Intervention Lesion Assessment  The intervention was successful. Pre-interventional TIMI flow is 3. Post-intervention TIMI flow is 3. No complications occurred at this lesion.  There is no residual stenosis post intervention.  Post Atrio-3 lesion  Stent  Lesion crossed with guidewire using a WIRE HI TORQ [place] 190CM. Pre-stent angioplasty was performed using a BALLOON SAPPHIRE 2.5X12. A drug-eluting stent was successfully placed using a STENT SYNERGY DES 2.5X12. Stent strut is well apposed. Post-stent angioplasty was not performed. There was a proximal stent edge dissection. This was treated with an overlapping stent, 2.25 x 12 Synergy.  Post-Intervention Lesion Assessment  There is no residual stenosis post intervention.  Left Heart   Left Ventricle LV end diastolic pressure is normal.  Aortic Valve There is no aortic valve stenosis.  Coronary Diagrams   Diagnostic Diagram       Post-Intervention Diagram        Conclusion     Dist LAD lesion is 100% stenosed.  Mid LAD lesion is 70% stenosed.  Ost 1st Mrg lesion is 80% stenosed.  A drug-eluting stent was successfully placed using a STENT SYNERGY DES 2.75X12.  Post intervention, there is a 0% residual stenosis.  Ost RPDA lesion is 75% stenosed.  A drug-eluting stent was successfully placed using a STENT SYNERGY DES 3X16.  Post intervention, there is a 0% residual stenosis.  Post Atrio-2 lesion is 70% stenosed.  Post Atrio-3 lesion is 95% stenosed.  A drug-eluting stent was successfully placed using a STENT SYNERGY DES 2.5X12. A second stent, 2.25 x 12 Synergy was placed  overlapping.  Post intervention, there is a 0% residual stenosis.  Kissing balloon angioplasty was done at the ostium of the PDA and the PLA.  The ostium of the posterolateral artery was treated Using a BALLOON SAPPHIRE 2.5X12.  Post intervention, there is a 40% residual stenosis at the ostium of the PLA.  LV end diastolic pressure is normal.  There is no aortic valve stenosis.  A drug-eluting stent was successfully placed.   Successful PCI of the OM1, PDA, PLA ostium and mid PLA.    Continue dual antiplatelet therapy for at least one year along with aggressive secondary prevention.    Would consider indefinite clopidogrel given the extent of his disease.      Patient Profile     47 y.o. male with history of HTN and DM2 presented to [facility] on 10/15/17 with NSTEMI, found to have severe LAD stenosis,proximal circ/OM has a tight focal lesion and there is also a severe lesion involving the distal RCA bifurcation. CABG vs PCI considered, ultimately PCI chosen.  Assessment & Plan    1. NSTEMI/CAD - s/p multivessel PCI of the OM1, PDA, PLA ostium and mid PLA 10/17/16 - recommend DAPT at least one year, possibly indefinitely given extent of disease - initially on ASA/Briltina, changed ASA/Plavix given he would have 100% copay - continue ASA, BB, statin, DAPT  - LVEF 55-60%, mild LVH by echo  2. HTN: - BP moderately elevated this AM - ACEi has been on hold given mild CKD - increase carvedilol to 25mg  BID  - continue amlodipine 10mg  daily - anticipate resuming ACEI as OP if Cr stabilizes  3. Uncontrolled DM II A1C  8.6 - continue SSI and Levemir - will need close follow up with PCP for management, diet and medication education   4. 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 Headache  - noted 1/11 after [place] bacon; also may be driven by BP - resolved - no focal neuro sx - follow up with PCP  6.  PVCs - asymptomatic - increase Coreg as above    For questions or updates, please contact [facility] Please consult [email or web address] for contact info under Cardiology/STEMI.    Signed, Eula Listen, PA-C [facility] Pager: [phone] 10/19/2017, 8:30 AM   The patient was seen and examined, and I agree with the history, physical exam, assessment and plan as documented above, with modifications as noted below.  He is feeling well and denies chest pain and shortness of breath after undergoing PCI of the circumflex and RCA. BP is better controlled with the addition of amlodipine and increase of carvedilol. Physical exam unremarkable. Creatinine down to 1.32 from 1.42 yesterday. As he could not afford Brilinta, he will be on Plavix. He will need more optimal control of type 2 diabetes. If BP remains elevated in the outpatient setting, should consider restarting ACEI given concomitant diabetes. He will be discharged today.  Prentice Docker, MD, [facility]  10/19/2017 8:45 AM

## 2017-10-19 NOTE — Discharge Summary (Signed)
Discharge Summary    Patient ID: Matthew Fisher  MRN: [id number], DOB/AGE: 1971/03/10 47 y.o.  Admit Date: 10/15/2017 Discharge Date: 10/19/2017  Primary Care Provider: Maylon Peppers, MD Primary Cardiologist: Dr. Ellyn Hack, MD  Discharge Diagnoses    Active Problems:   NSTEMI (non-ST elevated myocardial infarction) [facility])   Hypertensive emergency, no CHF   Type 2 diabetes mellitus with complication, with long-term current use of insulin ([place])   Essential hypertension   Gastroparesis due to secondary diabetes ([place])   CAD in native artery   CKD (chronic kidney disease), stage II   Allergies No Known Allergies   History of Present Illness     47 year old male with history of hypertension and diabetes who recently had shoulder surgery back in August 2018. He also was recently admitted to [facility] with GI symptoms roughly 2 weeks ago.  This was felt to be related to viral gastroenteritis who presented to [facility] 1/8 with chest pain and was found to have a NSTEMI with a peak troponin of 8.17.   Hospital Course     Consultants: cardiac rehab   Prior to this admission, [name] did not have any previously known cardiac history. He presented to [place] emergency room 1/8 with what sounds like epigastric pain. This was thought to be may be related to his similar symptoms from his recent hospitalization at [facility] for gastroenteritis, however he continued to have significant elevated blood pressures and persistent chest pain for roughly 12 hours despite being on increasing dose of nitroglycerin. He also was receiving narcotics and other medications to lower his blood pressures. His chest pain remained roughly 4-7 out of 10. He subsequently ruled in for MI with troponin of roughly 5 with subtle inferior ST depressions on EKG. Therefore there was concern for possibly having active ongoing ischemia. Based on this concern, he was brought urgently to [facility] Cardiac  Catheterization Lab for expedited ischemic evaluation. The patient was met in the catheterization lab and examined and evaluated by Dr. Ellyn Hack. He underwent LHC 1/8 that showed severe multi-vessel CAD as detailed below. Given the extent of his CAD and his comorbid condtion, it was felt he should be evaluated for possible CABG. Echo showed EF 55-60%, normal wall motion, normal LV diastolic function. Films were reviewed by interventional cardiology, given no high-grade stenosis in the left main or proximal LAD. Considering his relatively young age and lack of severe LAD stenosis, PCI was favored as an initial strategy. He was loaded with Brilinta and underwent staged PCI to the OM1, PDA, ostial PDA and mid PLA. It was recommended he be on DAPT for at least 1 year along with secondary prevention. Consideration of life long Plavix was recommended given the extent of his disease. Following his procedure, he eat a piece of [name]. This was followed by a headache and elevated BP. Symptoms improved with pain medications and controlling of his BP. He was changed from Brilinta to Plavix given cost of medication. On day of discharge, he was doing well from a cardiac perspective. He ambulated without issues. Post cath labs were stable.   The patient's right radial cath site has been examined is healing well without issues at this time. The patient has been seen by Dr. Bronson Ing, MD and felt to be stable for discharge today. All follow up appointments have been made. Discharge medications are listed below. Prescriptions have been reviewed with the patient and sent in to their  pharmacy.  _____________  Discharge Vitals Blood pressure (!) 142/83, pulse 77, temperature 98 F (36.7 C), temperature source Oral, resp. rate 11, height '5\' 8"'  (1.727 m), weight 196 lb 13.9 oz (89.3 kg), SpO2 98 %.  Filed Weights   10/16/17 0011 10/16/17 1643 10/17/17 0535  Weight: 182 lb 15.7 oz (83 kg) 190 lb 11.2 oz (86.5 kg) 196 lb  13.9 oz (89.3 kg)    Labs & Radiologic Studies    CBC Recent Labs    10/18/17 0430 10/19/17 0608  WBC 8.7 7.8  HGB 10.6* 10.7*  HCT 29.8* 28.6*  MCV 86.1 85.9  PLT 211 124   Basic Metabolic Panel Recent Labs    10/18/17 0430 10/19/17 0608  NA 135 134*  K 3.5 3.5  CL 100* 100*  CO2 26 26  GLUCOSE 219* 179*  BUN 17 15  CREATININE 1.42* 1.32*  CALCIUM 8.6* 8.4*   Liver Function Tests No results for input(s): AST, ALT, ALKPHOS, BILITOT, PROT, ALBUMIN in the last 72 hours. No results for input(s): LIPASE, AMYLASE in the last 72 hours. Cardiac Enzymes Recent Labs    10/16/17 1142  TROPONINI 6.22*   BNP Invalid input(s): POCBNP D-Dimer No results for input(s): DDIMER in the last 72 hours. Hemoglobin A1C No results for input(s): HGBA1C in the last 72 hours. Fasting Lipid Panel No results for input(s): CHOL, HDL, LDLCALC, TRIG, CHOLHDL, LDLDIRECT in the last 72 hours. Thyroid Function Tests No results for input(s): TSH, T4TOTAL, T3FREE, THYROIDAB in the last 72 hours.  Invalid input(s): FREET3 _____________  Dg Chest 2 View  Result Date: 10/15/2017 CLINICAL DATA:  Cough.  Chest pain EXAM: CHEST  2 VIEW COMPARISON:  10/12/2017.  06/03/2016. FINDINGS: Heart size is normal. Mediastinal shadows are normal. Question mild patchy infiltrate in the left lower lobe seen on the frontal view. Not confirmed on the lateral view. No dense consolidation, volume loss or effusion. No bone abnormality. IMPRESSION: No active disease versus hazy infiltrate left lower lobe. Electronically Signed   By: Nelson Chimes M.D.   On: 10/15/2017 13:31   Dg Abd 2 Views  Result Date: 10/15/2017 CLINICAL DATA:  Abdominal pain with nausea and vomiting EXAM: ABDOMEN - 2 VIEW COMPARISON:  Abdomen series October 12, 2017 FINDINGS: Supine and upright images obtained. There are multiple air-fluid levels throughout the abdomen. No appreciable bowel dilatation. No free air. Lung bases clear. No abnormal  calcifications. IMPRESSION: Multiple air-fluid levels without appreciable bowel dilatation. Suspect a degree of ileus or enteritis, although a degree of partial bowel obstruction is possible. Note that there is moderate air in the colon. No free air. Lung bases clear. Electronically Signed   By: Lowella Grip III M.D.   On: 10/15/2017 14:19   Dg Abdomen Acute W/chest  Result Date: 10/12/2017 CLINICAL DATA:  47 year old male with vomiting, diarrhea, weakness and fatigue EXAM: DG ABDOMEN ACUTE W/ 1V CHEST COMPARISON:  Prior chest x-ray 06/03/2016 FINDINGS: Cardiac and mediastinal contours are within normal limits. The lungs are clear. No pneumothorax or pleural effusion. No evidence of free air. Gas is present within a few loops of nondistended bowel in the left mid abdomen. Gas is otherwise present throughout the colon. No abnormal mass effect or calcifications. Osseous structures are intact and unremarkable. IMPRESSION: 1. Normal chest x-ray. 2. Nonspecific bowel gas pattern with gas in a few loops of borderline distended small bowel in the left mid abdomen. Differential considerations include localized reactive ileus versus very early or partial small bowel obstruction.  Electronically Signed   By: Jacqulynn Cadet M.D.   On: 10/12/2017 13:44    Diagnostic Studies/Procedures   LHC 10/15/2017:  Conclusion     Distal RCA bifurcation lesionS: Ost RPDA lesion is 50% stenosed. Post Atrio-1 lesion is 70% stenosed. Post Atrio-2 lesion is 95% stenosed.  Ost 1st Mrg lesion is 80% stenosed. Major bifurcating OM branch.  Prox Cx to Mid Cx lesion is 50% stenosed. Mid Cx to Dist Cx lesion is 90% stenosed.  Prox LAD to Mid LAD lesion is 60% stenosed. Mid LAD lesion is 70% stenosed. Dist LAD lesion is 100% stenosed.  Ost 1st Diag to 1st Diag lesion is 90% stenosed. Small moderate caliber major diagonal branch.  There is hyperdynamic left ventricular systolic function. The left ventricular ejection  fraction is greater than 65% by visual estimate.  LV end diastolic pressure is normal.  There is no mitral valve regurgitation.  Severe multivessel disease, possibly diabetic in nature.  The LAD itself is probably the least diseased vessel, however distally it is diffusely diseased and occludes. The major OM1 has a focal 70-80% stenosis, and the AV groove circumflex terminates with severe disease in multiple small bifurcations. There is bifurcation disease at the distal RCA into the RPDA and the RPAV with a more severe lesion in the proximal portion of the PADV that is a likely culprit lesion.  Given the extent of his CAD, the patient needs to be considered for bypass surgery is an option with diabetes and multivessel disease. Unfortunately, targets not great with exception of the PDA, PL and OM1. Diagonal and LAD are decent graft targets.  Plan for now will be to admit the patient to the stepdown/ICU unit for maintaining stability with IV nitroglycerin and heparin. We will discuss with interventional colleagues and likely consult CT surgery morning. Restart IV nitroglycerin for blood pressure control. Add carvedilol to his home dose of ACE inhibitor. High-dose statin and aspirin.  If he does go for bypass surgery, he would need to be on Plavix or Brilinta on discharge given this year extent of his disease.   TTE 10/16/2017: Study Conclusions  - Left ventricle: The cavity size was mildly dilated. Wall thickness was increased in a pattern of mild LVH. Systolic function was normal. The estimated ejection fraction was in the range of 55% to 60%. Wall motion was normal; there were no regional wall motion abnormalities. Left ventricular diastolic function parameters were normal. - Atrial septum: No defect or patent foramen ovale was identified.  LHC 10/17/2017: Coronary Findings   Diagnostic  Dominance: Right  Left Anterior Descending  Prox LAD to Mid LAD lesion 60%  stenosed  Prox LAD to Mid LAD lesion is 60% stenosed. The lesion is located at the bend.  Mid LAD lesion 70% stenosed  Mid LAD lesion is 70% stenosed. The lesion is located at the bend and eccentric.  Dist LAD lesion 100% stenosed  Dist LAD lesion is 100% stenosed. The lesion is chronically occluded. The vessel just seems to taper down and stop off prior to reaching the apex.  First Diagonal Branch  Vessel is moderate in size.  Ost 1st Diag to 1st Diag lesion 90% stenosed  Ost 1st Diag to 1st Diag lesion is 90% stenosed.  Lateral First Diagonal Branch  Vessel is small in size. There is severe disease in the vessel.  Second Diagonal Branch  Vessel is small in size.  Third Diagonal Branch  Vessel is small in size.  Left Circumflex  Vessel  is large. There is moderate diffuse disease throughout the vessel. There is severe focal disease in the vessel.  Prox Cx to Mid Cx lesion 50% stenosed  Prox Cx to Mid Cx lesion is 50% stenosed. The lesion is located at the major branch.  Mid Cx to Dist Cx lesion 90% stenosed  Mid Cx to Dist Cx lesion is 90% stenosed. The lesion is segmental. Diffuse, irregular  First Obtuse Marginal Branch  Vessel is large in size. The distal circumflex terminates as a small caliber posterolateral branch. Prior to bifurcating, there is a small caliber side branch just after the lesion.  Ost 1st Mrg lesion 80% stenosed  Ost 1st Mrg lesion is 80% stenosed. The lesion is located proximal to the major branch, discrete and concentric.  Lateral First Obtuse Marginal Branch  Vessel is moderate in size. There is moderate diffuse disease in the vessel. This moderate caliber branch was off one proximal branchand then bifurcates into 2 small distal branches.  Second Obtuse Marginal Branch  Vessel is small in size. There is moderate diffuse disease in the vessel.  Right Coronary Artery  Acute Marginal Branch  Vessel is small in size. There is moderate disease in the vessel.    Right Posterior Descending Artery  Ost RPDA lesion 75% stenosed  Ost RPDA lesion is 75% stenosed. The lesion is tubular and eccentric.  Right Posterior Atrioventricular Branch  Vessel is moderate in size.  Post Atrio-1 lesion 70% stenosed  Post Atrio-1 lesion is 70% stenosed. The lesion is tubular and eccentric.  Post Atrio-2 lesion 70% stenosed  Post Atrio-2 lesion is 70% stenosed.  Post Atrio-3 lesion 95% stenosed  Post Atrio-3 lesion is 95% stenosed. Quite likely The lesion is eccentric and irregular.  Intervention   Ost 1st Mrg lesion  Stent  Lesion crossed with guidewire using a WIRE ASAHI PROWATER 180CM. Pre-stent angioplasty was performed using a BALLOON SAPPHIRE 2.5X12. A drug-eluting stent was successfully placed using a STENT SYNERGY DES 2.75X12. Stent strut is well apposed. Post-stent angioplasty was performed using a BALLOON SAPPHIRE [place] 3.0X8.  Post-Intervention Lesion Assessment  There is no residual stenosis post intervention.  Ost RPDA lesion  Stent  Lesion crossed with guidewire using a WIRE ASAHI PROWATER 180CM. Pre-stent angioplasty was not performed. A drug-eluting stent was successfully placed using a STENT SYNERGY DES 3X16. Stent strut is well apposed. Post-stent angioplasty was performed using a BALLOON SAPPHIRE [place] 3.0X8. PTCA of the PL was done with the 2.5 balloon before nad after PDA stent placement. THen, final kissing balloon inflation was performed. After the bifurcation treatment was performed, the second PLA stent was placed for the proximal edge dissection.  Post-Intervention Lesion Assessment  There is no residual stenosis post intervention.  Post Atrio-1 lesion  Angioplasty  using a BALLOON SAPPHIRE 2.5X12.  Post-Intervention Lesion Assessment  There is a 40% residual stenosis post intervention.  Post Atrio-2 lesion  Stent  Lesion crossed with guidewire using a WIRE ASAHI PROWATER 180CM. Pre-stent angioplasty was not performed. A drug-eluting stent  was successfully placed. Stent strut is well apposed. Stent overlaps previously placed stent. Post-stent angioplasty was not performed.  Post-Intervention Lesion Assessment  The intervention was successful. Pre-interventional TIMI flow is 3. Post-intervention TIMI flow is 3. No complications occurred at this lesion.  There is no residual stenosis post intervention.  Post Atrio-3 lesion  Stent  Lesion crossed with guidewire using a WIRE HI TORQ [place] 190CM. Pre-stent angioplasty was performed using a BALLOON SAPPHIRE 2.5X12. A drug-eluting stent  was successfully placed using a STENT SYNERGY DES 2.5X12. Stent strut is well apposed. Post-stent angioplasty was not performed. There was a proximal stent edge dissection. This was treated with an overlapping stent, 2.25 x 12 Synergy.  Post-Intervention Lesion Assessment  There is no residual stenosis post intervention.  Left Heart   Left Ventricle LV end diastolic pressure is normal.  Aortic Valve There is no aortic valve stenosis.  Coronary Diagrams   Diagnostic Diagram       Post-Intervention Diagram        Conclusion     Dist LAD lesion is 100% stenosed.  Mid LAD lesion is 70% stenosed.  Ost 1st Mrg lesion is 80% stenosed.  A drug-eluting stent was successfully placed using a STENT SYNERGY DES 2.75X12.  Post intervention, there is a 0% residual stenosis.  Ost RPDA lesion is 75% stenosed.  A drug-eluting stent was successfully placed using a STENT SYNERGY DES 3X16.  Post intervention, there is a 0% residual stenosis.  Post Atrio-2 lesion is 70% stenosed.  Post Atrio-3 lesion is 95% stenosed.  A drug-eluting stent was successfully placed using a STENT SYNERGY DES 2.5X12. A second stent, 2.25 x 12 Synergy was placed overlapping.  Post intervention, there is a 0% residual stenosis.  Kissing balloon angioplasty was done at the ostium of the PDA and the PLA.  The ostium of the posterolateral artery was treated Using a  BALLOON SAPPHIRE 2.5X12.  Post intervention, there is a 40% residual stenosis at the ostium of the PLA.  LV end diastolic pressure is normal.  There is no aortic valve stenosis.  A drug-eluting stent was successfully placed.  Successful PCI of the OM1, PDA, PLA ostium and mid PLA.   Continue dual antiplatelet therapy for at least one year along with aggressive secondary prevention.   Would consider indefinite clopidogrel given the extent of his disease.      _____________  Disposition   Pt is being discharged home today in good condition.  Follow-up Plans & Appointments    Follow-up Information    Lendon Colonel, NP Follow up.   Specialties:  Nurse Practitioner, Radiology, Cardiology Why:  [facility] - 10/28/17 at 9:30am. Curt Bears is one of our nurse practitioners that works closely with the [facility] team. Contact information: [address] [place] [address] [phone]        Maylon Peppers, MD Follow up in 1 week(s).   Specialty:  Family Medicine Why:  To discuss optimizing your diabetes regimen since your A1c is much higher than it should be (goal is less than 7, yours was 8.6). Contact information: [address] [place] [address] [phone]          Discharge Instructions    AMB Referral to Phase II Cardiac Rehab   Complete by:  As directed    Diagnosis:  Coronary Stents   Amb Referral to Cardiac Rehabilitation   Complete by:  As directed    To [place]   Diagnosis:   Coronary Stents PTCA NSTEMI     Diet - low sodium heart healthy   Complete by:  As directed    Increase activity slowly   Complete by:  As directed       Discharge Medications   Allergies as of 10/19/2017   No Known Allergies     Medication List    STOP taking these medications   labetalol 200 MG tablet Commonly known as:  NORMODYNE  lisinopril 10 MG tablet Commonly known as:  PRINIVIL,ZESTRIL     TAKE  these medications   amLODipine 10 MG tablet Commonly known as:  NORVASC Take 1 tablet (10 mg total) by mouth daily. Start taking on:  10/20/2017   aspirin 81 MG chewable tablet Chew 1 tablet (81 mg total) by mouth daily. Start taking on:  10/20/2017   atorvastatin 80 MG tablet Commonly known as:  LIPITOR Take 1 tablet (80 mg total) by mouth daily at 6 PM.   CARAFATE 1 GM/10ML suspension Generic drug:  sucralfate Take 1 g by mouth 4 (four) times daily -  with meals and at bedtime.   carvedilol 25 MG tablet Commonly known as:  COREG Take 1 tablet (25 mg total) by mouth 2 (two) times daily with a meal.   clopidogrel 75 MG tablet Commonly known as:  PLAVIX Take 1 tablet (75 mg total) by mouth daily. Start taking on:  10/20/2017   insulin detemir 100 UNIT/ML injection Commonly known as:  LEVEMIR Inject 20 Units into the skin daily.   metoCLOPramide 10 MG tablet Commonly known as:  REGLAN Take 1 tablet (10 mg total) by mouth every 6 (six) hours.   promethazine 25 MG suppository Commonly known as:  PHENERGAN Place 1 suppository (25 mg total) rectally every 6 (six) hours as needed for nausea or vomiting.   PROTONIX 40 MG tablet Generic drug:  pantoprazole Take 40 mg by mouth 2 (two) times daily.        Aspirin prescribed at discharge?  Yes High Intensity Statin Prescribed? (Lipitor 40-41m or Crestor 20-429m: Yes Beta Blocker Prescribed? Yes For EF <40%, was ACEI/ARB Prescribed? No: EF > 40% ADP Receptor Inhibitor Prescribed? (i.e. Plavix etc.-Includes Medically Managed Patients): Yes For EF <40%, Aldosterone Inhibitor Prescribed? No: EF > 40% Was EF assessed during THIS hospitalization? Yes Was Cardiac Rehab II ordered? (Included Medically managed Patients): Yes   Outstanding Labs/Studies   None.   Duration of Discharge Encounter   Greater than 30 minutes including physician time.  Signed, RyRise MuPA-C [facility] Pager: ([phone]/09/2018,  10:33 AM

## 2017-10-19 NOTE — Progress Notes (Signed)
CARDIAC REHAB PHASE I   PRE:  Rate/Rhythm: 84  BP:  Sitting: 124/80     SaO2: 97ra  MODE:  Ambulation: 500 ft   POST:  Rate/Rhythm: 87  BP:  Sitting: 101/63     SaO2: 100 10:45a-11:05a Patient ambulated independently. No complaints. Education has already been done and [facility]. Patient sitting in bed with girlfriend at side.   Matthew ListerMolly M Azharia Surratt, MS 10/19/2017 11:01 AM

## 2017-10-19 NOTE — Progress Notes (Signed)
The patient was seen and examined, and I agree with the history, physical exam, assessment and plan as documented by R. Dunn PA-C , with modifications as noted below.  He is feeling well and denies chest pain and shortness of breath after undergoing PCI of the circumflex and RCA. BP is better controlled with the addition of amlodipine and increase of carvedilol. Physical exam unremarkable. Creatinine down to 1.32 from 1.42 yesterday. As he could not afford Brilinta, he will be on Plavix. He will need more optimal control of type 2 diabetes. If BP remains elevated in the outpatient setting, should consider restarting ACEI given concomitant diabetes. He will be discharged today.  Prentice DockerSuresh Koneswaran, MD, [facility] Center[facility]  10/19/2017 8:39 AM

## 2017-10-22 ENCOUNTER — Telehealth: Payer: Self-pay | Admitting: Adult Health

## 2017-10-22 NOTE — Telephone Encounter (Signed)
LMTCB

## 2017-10-22 NOTE — Telephone Encounter (Signed)
Request for [facility] 10/21/16 @ 4:05pm Patient discharged from hospital on Oct 19, 2017 Patient has follow up on Oct 28, 2017 with Harriet PhoK. Lawrence, DNP

## 2017-10-22 NOTE — Telephone Encounter (Signed)
FW: will need TOC phone call - will be discharged later today likely  Received: Yesterday  Message Contents  Salvatore DecentFogleman, Marilyn K  P [facility] Clinical Pool            ----- Message -----  From: Laurann Montanaunn, Dayna N, PA-C  Sent: 10/18/2017 10:03 AM  To: [facility] Pcc  Subject: will need TOC phone call - will be discharge*

## 2017-10-23 NOTE — Telephone Encounter (Signed)
Patient contacted regarding discharge from  on discharge date}.10/15/2017 - 10/19/2017 ([facility] days) [facility]  Patient understands to follow up with provider Lawrence,DNP on 10-28-17 at 930am at [facility].  Patient understands discharge instructions? YES  Patient understands medications and regiment? YES-NO QUESTIONS  Patient understands to bring all medications to this visit? YES AND STATES THAT HE WILL ARRIVE EARLY FOR CHECK-IN PROCESS

## 2017-10-28 ENCOUNTER — Ambulatory Visit (INDEPENDENT_AMBULATORY_CARE_PROVIDER_SITE_OTHER): Payer: PRIVATE HEALTH INSURANCE | Admitting: Adult Health

## 2017-10-28 ENCOUNTER — Encounter: Payer: Self-pay | Admitting: Adult Health

## 2017-10-28 VITALS — BP 130/82 | HR 73 | Ht 68.0 in | Wt 203.0 lb

## 2017-10-28 DIAGNOSIS — I2510 Atherosclerotic heart disease of native coronary artery without angina pectoris: Secondary | ICD-10-CM

## 2017-10-28 DIAGNOSIS — E7800 Pure hypercholesterolemia, unspecified: Secondary | ICD-10-CM

## 2017-10-28 DIAGNOSIS — I10 Essential (primary) hypertension: Secondary | ICD-10-CM | POA: Diagnosis not present

## 2017-10-28 DIAGNOSIS — Z79899 Other long term (current) drug therapy: Secondary | ICD-10-CM | POA: Diagnosis not present

## 2017-10-28 MED ORDER — ATORVASTATIN CALCIUM 80 MG PO TABS: 80.0000 mg | ORAL_TABLET | Freq: Every day | ORAL | 5 refills | Status: DC

## 2017-10-28 MED ORDER — AMLODIPINE BESYLATE 10 MG PO TABS: 10.0000 mg | ORAL_TABLET | Freq: Every day | ORAL | 5 refills | Status: DC

## 2017-10-28 MED ORDER — CARVEDILOL 25 MG PO TABS: 25.0000 mg | ORAL_TABLET | Freq: Two times a day (BID) | ORAL | 5 refills | Status: DC

## 2017-10-28 MED ORDER — CLOPIDOGREL BISULFATE 75 MG PO TABS: 75.0000 mg | ORAL_TABLET | Freq: Every day | ORAL | 11 refills | Status: DC

## 2017-10-28 NOTE — Progress Notes (Signed)
Cardiology Office Note   Date:  10/28/2017   ID:  Matthew Fisher, DOB January 12, 1971, MRN [id number]  PCP:  Cheral Bay, MD  Cardiologist: Dr. Herbie Baltimore Chief Complaint  Patient presents with  . Coronary Artery Disease  . Hospitalization Follow-up  . Chest Pain     History of Present Illness: Matthew Fisher is a 47 y.o. male who presents for post hospital follow-up after admitted for NSTEMI, with hypertensive urgency, other history to include coronary artery disease, type 2 diabetes, and chronic kidney disease stage II.  He was originally admitted with GI symptoms which she thought were related to a virus, but had complaints of chest pain and was found to have a peak troponin of 8.17.  Patient had subsequent cardiac catheterization revealing multivessel coronary artery disease.  CV TS did review films and did not feel that he was a candidate for CABG due to his young age and lack of severe LAD stenosis..    Therefore the patient was loaded with Brilinta and underwent staged PCI of the OM1, PDA, ostial PDA, and PLA.  The patient was to remain on dual antiplatelet therapy for 1 year along with secondary prevention.  However consideration for lifelong Plavix was also recommended given the extent of his disease.  He comes today feeling better, he does not have any recurrent chest discomfort, but his wife has noticed that his energy level has not completely returned to normal.  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  He often unloads his large 18 wheeler at work, but he does have palates and lifts which help him.  He has been medically compliant.  His wife is concerned that he is doing too much.  He works on cars, rides [employer], and horses.  She would like him to slow down.  He is not yet been placed in cardiac rehab but it appointment is pending  Past Medical History:  Diagnosis Date  . Arthritis   . CAD (coronary artery disease)      a. NSTEMI 10/2017 s/p multivessel PCI of the OM1, PDA, PLA ostium and mid PLA.  . CKD (chronic kidney disease), stage II   . Gout   . Hypertension   . Insulin dependent diabetes mellitus (HCC)     Past Surgical History:  Procedure Laterality Date  . CORONARY STENT INTERVENTION N/A 10/17/2017   Procedure: CORONARY STENT INTERVENTION;  Surgeon: Corky Crafts, MD;  Location: [facility] INVASIVE CV LAB;  Service: Cardiovascular;  Laterality: N/A;  . FRACTURE SURGERY     right ring finger  . KNEE ARTHROSCOPY WITH LATERAL MENISECTOMY Left 01/22/2013   Procedure: LEFT KNEE ARTHROSCOPY DEBRIDEMENT CHONDROPLASTY, LATERAL RELEASE AND partial medial meniscectomy;  Surgeon: Javier Docker, MD;  Location: [facility]S;  Service: Orthopedics;  Laterality: Left;  . KNEE SURGERY     "removed bone"  . LEFT HEART CATH N/A 10/17/2017   Procedure: Left Heart Cath;  Surgeon: Corky Crafts, MD;  Location: [facility] INVASIVE CV LAB;  Service: Cardiovascular;  Laterality: N/A;  . LEFT HEART CATH AND CORONARY ANGIOGRAPHY N/A 10/15/2017   Procedure: LEFT HEART CATH AND CORONARY ANGIOGRAPHY;  Surgeon: Marykay Lex, MD;  Location: [facility] INVASIVE CV LAB;  Service: Cardiovascular;  Laterality: N/A;     Current Outpatient Medications  Medication Sig Dispense Refill  . amLODipine (NORVASC) 10 MG tablet Take 1 tablet (10 mg total) by mouth daily. 30 tablet 5  . aspirin 81 MG  chewable tablet Chew 1 tablet (81 mg total) by mouth daily. 30 tablet 11  . atorvastatin (LIPITOR) 80 MG tablet Take 1 tablet (80 mg total) by mouth daily at 6 PM. 30 tablet 5  . CARAFATE 1 GM/10ML suspension Take 1 g by mouth 4 (four) times daily -  with meals and at bedtime.     . carvedilol (COREG) 25 MG tablet Take 1 tablet (25 mg total) by mouth 2 (two) times daily with a meal. 60 tablet 5  . clopidogrel (PLAVIX) 75 MG tablet Take 1 tablet (75 mg total) by mouth daily. 30 tablet 11  . insulin detemir (LEVEMIR) 100 UNIT/ML injection Inject 20 Units  into the skin daily.    . metoCLOPramide (REGLAN) 10 MG tablet Take 1 tablet (10 mg total) by mouth every 6 (six) hours. 8 tablet 0  . promethazine (PHENERGAN) 25 MG suppository Place 1 suppository (25 mg total) rectally every 6 (six) hours as needed for nausea or vomiting. 6 each 0  . PROTONIX 40 MG tablet Take 40 mg by mouth 2 (two) times daily.     No current facility-administered medications for this visit.     Allergies:   Patient has no known allergies.    Social History:  The patient  reports that he quit smoking about 15 years ago. His smoking use included cigarettes. He has a 10.00 pack-year smoking history. he has never used smokeless tobacco. He reports that he drinks alcohol. He reports that he does not use drugs.   Family History:  The patient's family history includes Diabetes in his mother.    ROS: All other systems are reviewed and negative. Unless otherwise mentioned in H&P    PHYSICAL EXAM: VS:  BP 130/82   Pulse 73   Ht 5\' 8"  (1.727 m)   Wt 203 lb (92.1 kg)   BMI 30.87 kg/m  , BMI Body mass index is 30.87 kg/m. GEN: Well nourished, well developed, in no acute distress  HEENT: normal  Neck: no JVD, carotid bruits, or masses Cardiac: RRR; no murmurs, rubs, or gallops,no edema  Respiratory:  clear to auscultation bilaterally, normal work of breathing GI: soft, nontender, nondistended, + BS MS: no deformity or atrophy right wrist is well-healed at catheter insertion site without evidence of ecchymosis or bleeding skin: warm and dry, no rash Neuro:  Strength and sensation are intact Psych: euthymic mood, full affect   EKG: Normal sinus rhythm, with T wave inversion inferiorly and laterally.  Heart rate of 73 bpm  Recent Labs: 10/16/2017: ALT 20 10/19/2017: BUN 15; Creatinine, Ser 1.32; Hemoglobin 10.7; Platelets 181; Potassium 3.5; Sodium 134    Lipid Panel    Component Value Date/Time   CHOL 111 10/16/2017 0524   TRIG 94 10/16/2017 0524   HDL 29 (L)  10/16/2017 0524   CHOLHDL 3.8 10/16/2017 0524   VLDL 19 10/16/2017 0524   LDLCALC 63 10/16/2017 0524      Wt Readings from Last 3 Encounters:  10/28/17 203 lb (92.1 kg)  10/17/17 196 lb 13.9 oz (89.3 kg)  10/12/17 207 lb (93.9 kg)      Other studies Reviewed: Cardiac Cath 10/17/2017 Conclusion     Dist LAD lesion is 100% stenosed.  Mid LAD lesion is 70% stenosed.  Ost 1st Mrg lesion is 80% stenosed.  A drug-eluting stent was successfully placed using a STENT SYNERGY DES 2.75X12.  Post intervention, there is a 0% residual stenosis.  Ost RPDA lesion is 75% stenosed.  A  drug-eluting stent was successfully placed using a STENT SYNERGY DES 3X16.  Post intervention, there is a 0% residual stenosis.  Post Atrio-2 lesion is 70% stenosed.  Post Atrio-3 lesion is 95% stenosed.  A drug-eluting stent was successfully placed using a STENT SYNERGY DES 2.5X12. A second stent, 2.25 x 12 Synergy was placed overlapping.  Post intervention, there is a 0% residual stenosis.  Kissing balloon angioplasty was done at the ostium of the PDA and the PLA.  The ostium of the posterolateral artery was treated Using a BALLOON SAPPHIRE 2.5X12.  Post intervention, there is a 40% residual stenosis at the ostium of the PLA.  LV end diastolic pressure is normal.  There is no aortic valve stenosis.  A drug-eluting stent was successfully placed.   Successful PCI of the OM1, PDA, PLA ostium and mid PLA.    Continue dual antiplatelet therapy for at least one year along with aggressive secondary prevention.         ASSESSMENT AND PLAN:  1.  Multivessel CAD: The patient has had PCI to the OM1 PDA, PLA ostium and mid PLA.  He remains on dual antiplatelet therapy, high-dose beta-blocker, atorvastatin.  He offers no complaints of significant side effects.  The patient is due to follow-up with cardiac rehab and appointment is scheduled.  He is not yet returned to work.  His wife feels that he  is overdoing now.  I have advised him to take things a step at a time, participate in cardiac rehab, and return to work without restriction.  However, he is not to do any extra work on the side, heavy lifting greater than 50 pounds.  Once he finishes cardiac rehab, I believe that he will be ready to resume his normal activities at 100%.  He has been advised that with other multiple blockages which were not intervened on that he will need to keep [place] today concerning his symptoms, energy level, and any recurrent discomfort.  Of course being a cardiac rehab they will notify us of any abnormalities.  2.  Hypertension: Currently well controlled.  Continue carvedilol, amlodipin3.  Diabetes: He remains on insulin.  Consider GLP-1 in additions to insulin. Defer to PCP for management.   3. Hypercholesterolemia.  Tinea statin therapy, fasting lipids LFTs and BMET will be ordered prior to follow-up appointment in 3 months   Current medicines are reviewed at length with the patient today.    Labs/ tests ordered today include: Fasting lipids LFTs and BMET.  Completed in 3 months prior to next appointment.  Bettey Mare. Liborio Nixon, ANP, AACC   10/28/2017 10:53 AM    [facility] [address], [place], [place] [address] Phone: [phone]; Fax: [phone]

## 2017-10-28 NOTE — Patient Instructions (Signed)
Medication Instructions:  NO CHANGES-Your physician recommends that you continue on your current medications as directed. Please refer to the Current Medication list given to you today.  If you need a refill on your cardiac medications before your next appointment, please call your pharmacy.  Labwork: BMET AND FASTING LFT-ONE WEEK BEFORE FOLLOW UP APPT WITH DR HARDING HERE IN OUR OFFICE AT [facility]  Take the provided lab slips for you to take with you to the lab for you blood draw.   You will need to fast. DO NOT EAT OR DRINK PAST MIDNIGHT.   You may go to any [facility] lab that is convenient for you however, we do have a lab in our office that is able to assist you. You do NOT need an appointment for our lab. Once in our office lobby there is a podium to the right of the check-in desk where you are to sign-in and ring a doorbell to alert us you are here. Lab is open Monday-Friday from 8:00am to 4:00pm; and is closed for lunch from 12:45p-1:45pm   Special Instructions: START CARDIAC REHAB  OK TO RETURN TO WORK WITH NO RESTRICTIONS  Follow-Up: Your physician wants you to follow-up in: 3 MONTHS WITH DR HARDING.   Thank you for choosing [facility] at [facility]!!

## 2018-01-28 ENCOUNTER — Ambulatory Visit: Payer: PRIVATE HEALTH INSURANCE | Admitting: Cardiology

## 2018-02-16 IMAGING — US US SCROTUM
1 series · 13 of 25 positions shown · non-contrast
Comparison: CT abdomen pelvis of [DATE]

CLINICAL DATA: Left testicular pain for 3 days, some urinary
urgency

EXAM:
SCROTAL ULTRASOUND
DOPPLER ULTRASOUND OF THE TESTICLES
TECHNIQUE: Complete ultrasound examination of the testicles, epididymis, and
other scrotal structures was performed. Color and spectral Doppler
ultrasound were also utilized to evaluate blood flow to the
testicles.

[Series 1: us scrotum · 0.10mm/px · 13 of 71 slices shown]
[im 1/71]
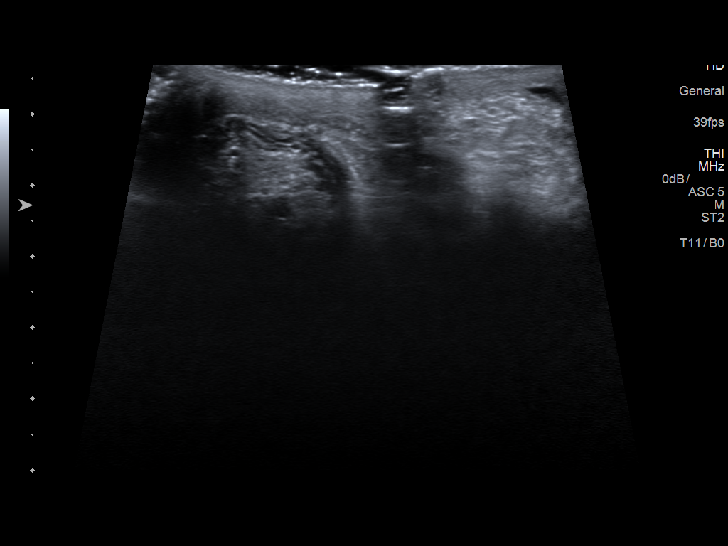
[im 6/71]
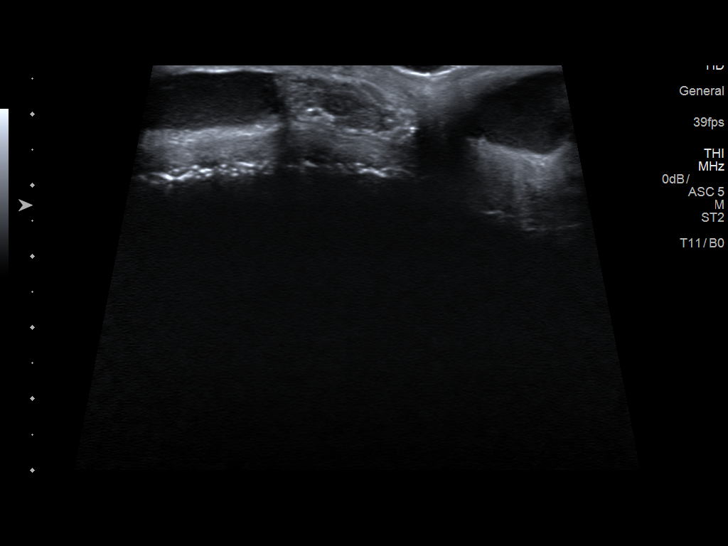
[im 12/71]
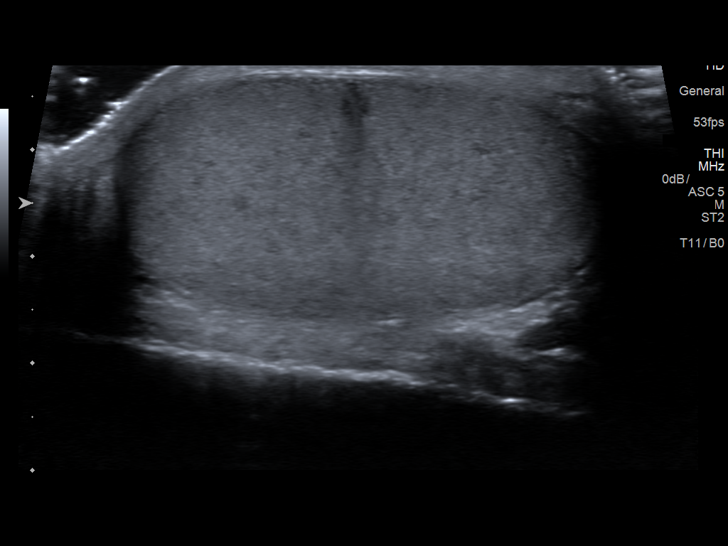
[im 18/71]
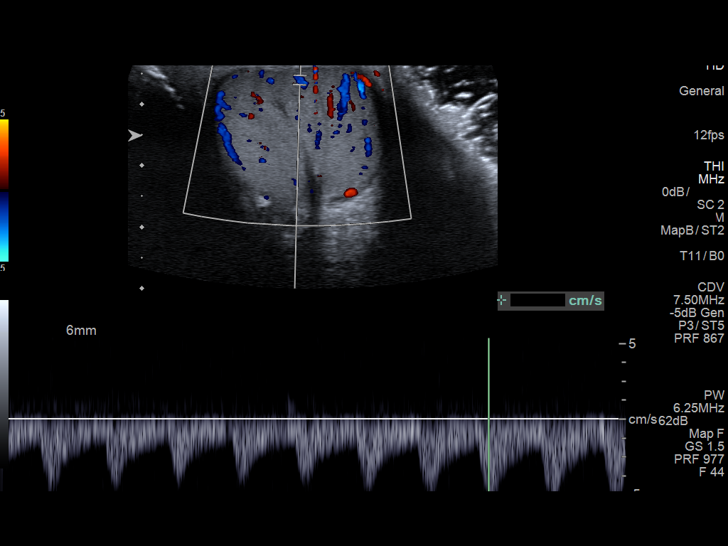
[im 24/71]
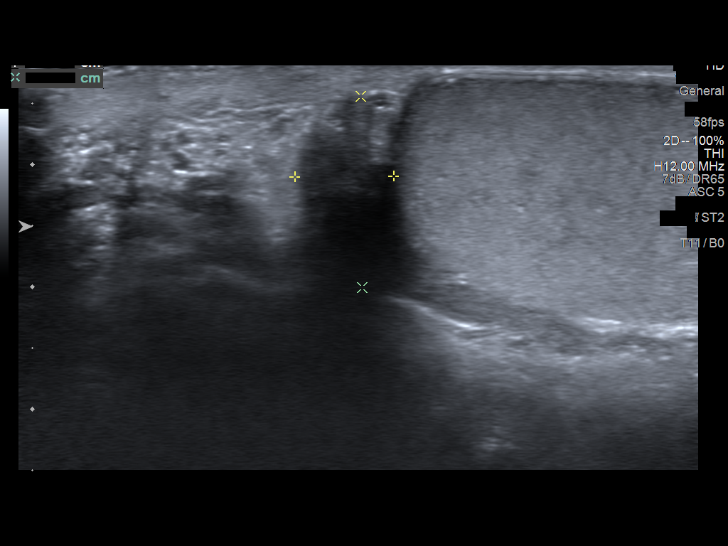
[im 30/71]
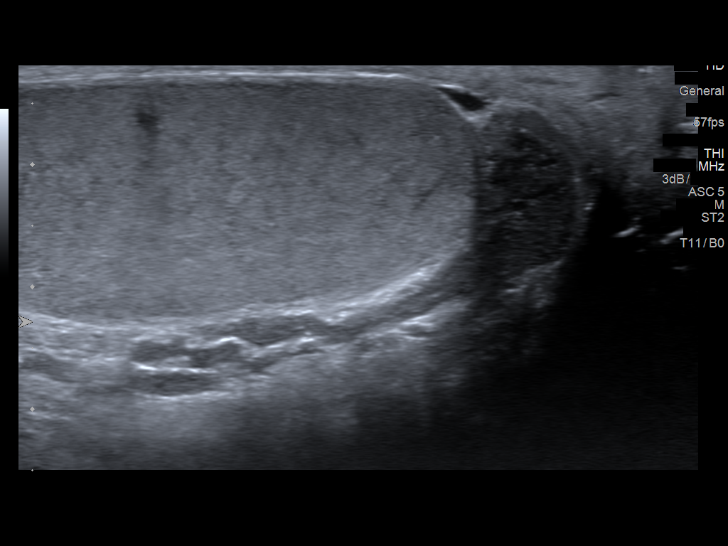
[im 36/71]
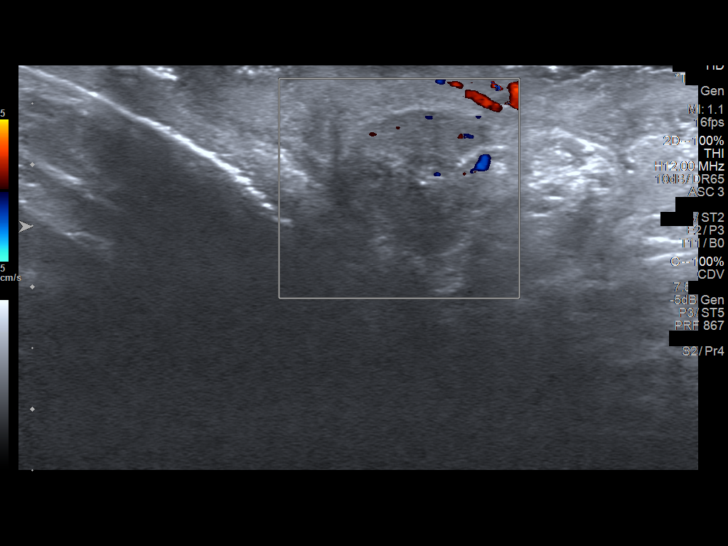
[im 41/71]
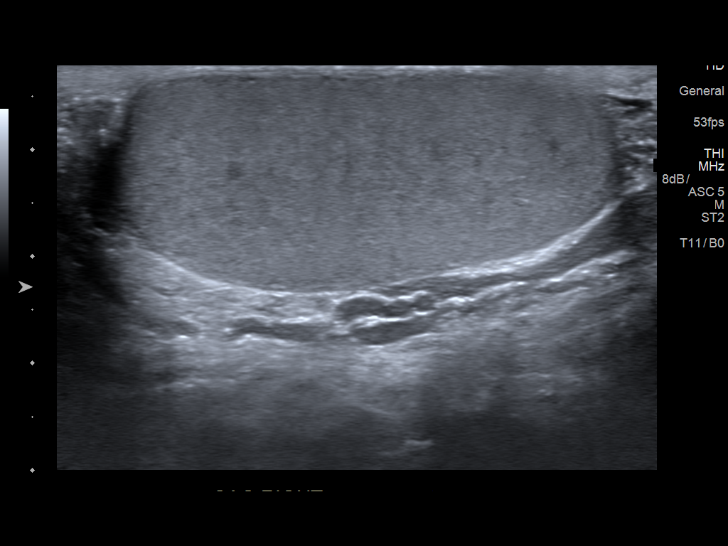
[im 47/71]
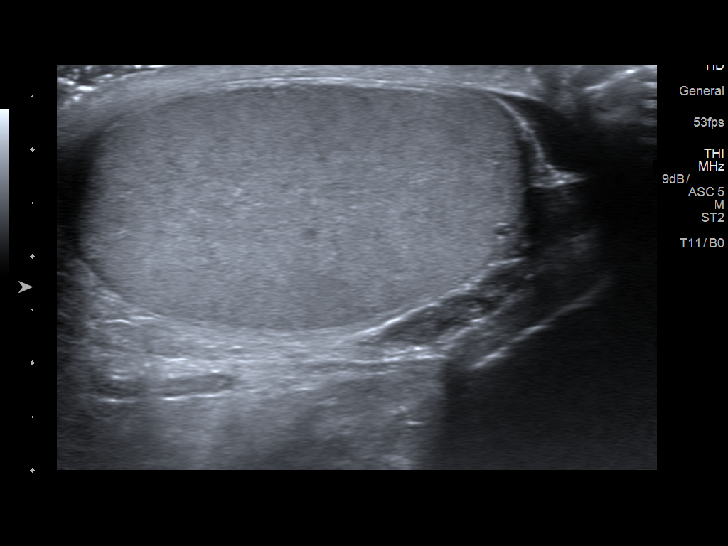
[im 53/71]
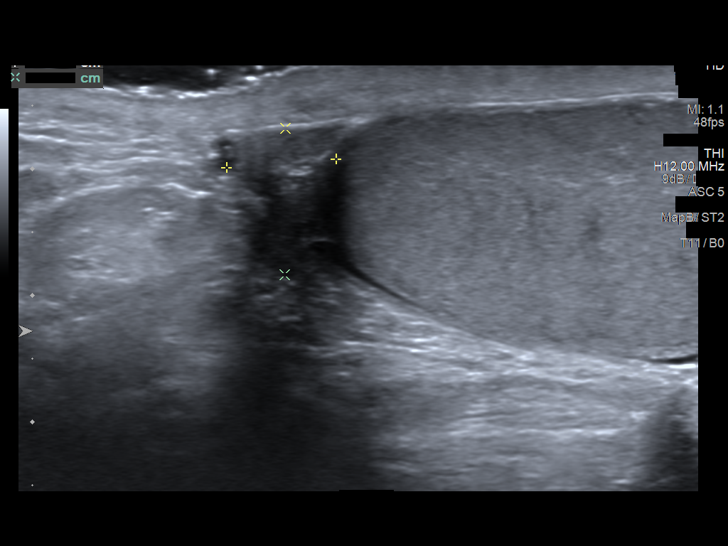
[im 59/71]
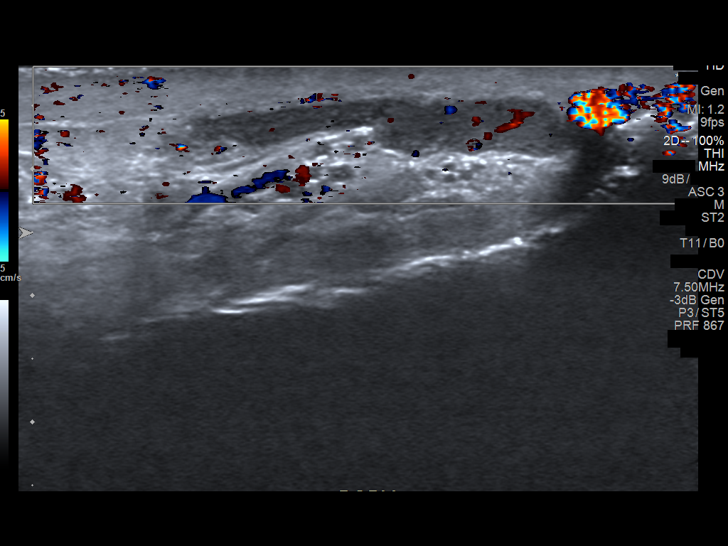
[im 65/71]
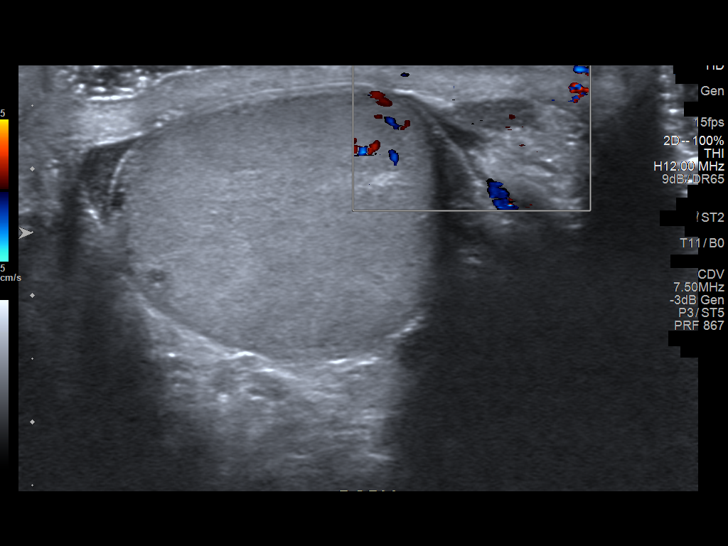
[im 71/71]
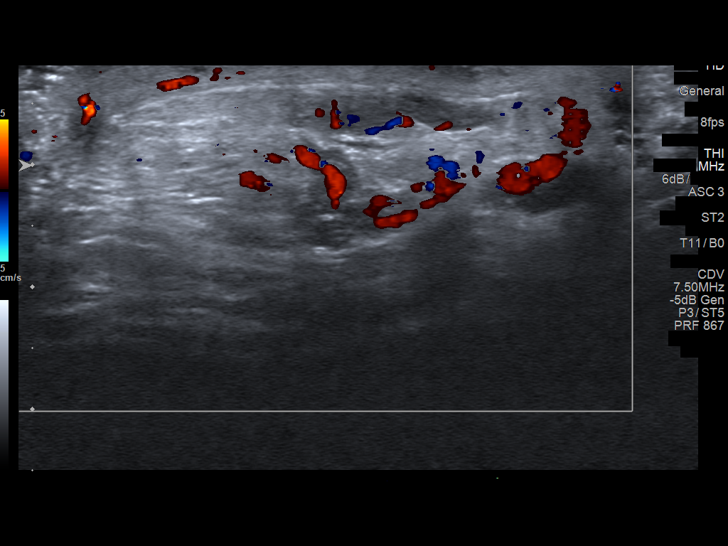

[13 of 25 positions shown; findings below may reference images not displayed]

FINDINGS: Right testicle

Measurements: The right testicle measures 4.7 x 2.1 x 2.8 cm.. No
intratesticular abnormality is seen. Blood flow is demonstrated to
the right testicle with arterial and venous waveforms.

Left testicle

Measurements: The left testicle measures 4.3 x 2.5 x 2.8 cm.. No
intratesticular abnormality is seen. Blood flow is demonstrated to
the left testicle with arterial and venous waveforms.

Right epididymis:  The right epididymis is unremarkable.

Left epididymis: A tiny left epididymal cyst is present of 4 mm in
diameter.

Hydrocele:  A small amount of fluid is noted bilaterally.

Varicocele:  No definite varicocele is identified.

Pulsed Doppler interrogation of both testes demonstrates normal low
resistance arterial and venous waveforms bilaterally.
IMPRESSION: 1. No intratesticular abnormality is seen. Blood flow is
demonstrated to both testicles.
2. Small amount of fluid bilaterally.
3. 4 mm left epididymal cyst.

## 2018-09-23 ENCOUNTER — Emergency Department (HOSPITAL_BASED_OUTPATIENT_CLINIC_OR_DEPARTMENT_OTHER)
Admission: EM | Admit: 2018-09-23 | Discharge: 2018-09-23 | Disposition: A | Discharge status: Home / Self Care | Payer: Self-pay | Attending: Emergency Medicine | Admitting: Emergency Medicine

## 2018-09-23 ENCOUNTER — Ambulatory Visit (HOSPITAL_BASED_OUTPATIENT_CLINIC_OR_DEPARTMENT_OTHER)
Admit: 2018-09-23 | Discharge: 2018-09-23 | Disposition: A | Discharge status: Home / Self Care | Payer: Self-pay | Attending: Emergency Medicine | Admitting: Emergency Medicine

## 2018-09-23 ENCOUNTER — Other Ambulatory Visit: Payer: Self-pay

## 2018-09-23 ENCOUNTER — Encounter (HOSPITAL_BASED_OUTPATIENT_CLINIC_OR_DEPARTMENT_OTHER): Payer: Self-pay | Admitting: Emergency Medicine

## 2018-09-23 ENCOUNTER — Emergency Department (HOSPITAL_BASED_OUTPATIENT_CLINIC_OR_DEPARTMENT_OTHER): Payer: Self-pay

## 2018-09-23 DIAGNOSIS — I129 Hypertensive chronic kidney disease with stage 1 through stage 4 chronic kidney disease, or unspecified chronic kidney disease: Secondary | ICD-10-CM | POA: Insufficient documentation

## 2018-09-23 DIAGNOSIS — Z79899 Other long term (current) drug therapy: Secondary | ICD-10-CM | POA: Insufficient documentation

## 2018-09-23 DIAGNOSIS — N4400 Torsion of testis, unspecified: Secondary | ICD-10-CM

## 2018-09-23 DIAGNOSIS — I2510 Atherosclerotic heart disease of native coronary artery without angina pectoris: Secondary | ICD-10-CM | POA: Insufficient documentation

## 2018-09-23 DIAGNOSIS — Z7901 Long term (current) use of anticoagulants: Secondary | ICD-10-CM | POA: Insufficient documentation

## 2018-09-23 DIAGNOSIS — Z87891 Personal history of nicotine dependence: Secondary | ICD-10-CM | POA: Insufficient documentation

## 2018-09-23 DIAGNOSIS — N182 Chronic kidney disease, stage 2 (mild): Secondary | ICD-10-CM | POA: Insufficient documentation

## 2018-09-23 DIAGNOSIS — Z794 Long term (current) use of insulin: Secondary | ICD-10-CM | POA: Insufficient documentation

## 2018-09-23 DIAGNOSIS — E1122 Type 2 diabetes mellitus with diabetic chronic kidney disease: Secondary | ICD-10-CM | POA: Insufficient documentation

## 2018-09-23 DIAGNOSIS — N50812 Left testicular pain: Secondary | ICD-10-CM | POA: Insufficient documentation

## 2018-09-23 LAB — URINALYSIS, MICROSCOPIC (REFLEX)

## 2018-09-23 LAB — URINALYSIS, ROUTINE W REFLEX MICROSCOPIC
BILIRUBIN URINE: NEGATIVE
Glucose, UA: >=500 mg/dL — AB
KETONES UR: NEGATIVE mg/dL
Leukocytes, UA: NEGATIVE
Nitrite: NEGATIVE
PH: 5 (ref 5.0–8.0)
Protein, ur: NEGATIVE mg/dL

## 2018-09-23 IMAGING — CT CT RENAL STONE PROTOCOL
2 of 4 series · 17 of 46 positions shown, 19 images · non-contrast
Comparison: CT abdomen pelvis dated [DATE].

CLINICAL DATA: Left testicle and leg pain for the past 3 days.
Unable to urinate.

EXAM:
CT ABDOMEN AND PELVIS WITHOUT CONTRAST
TECHNIQUE: Multidetector CT imaging of the abdomen and pelvis was performed
following the standard protocol without IV contrast.

[Series 2: axial st · axial · 0.86mm/px · z∈[-543,-88]mm · 14 of 101 slices shown, 16 images]
[im 5/101  soft-tissue]
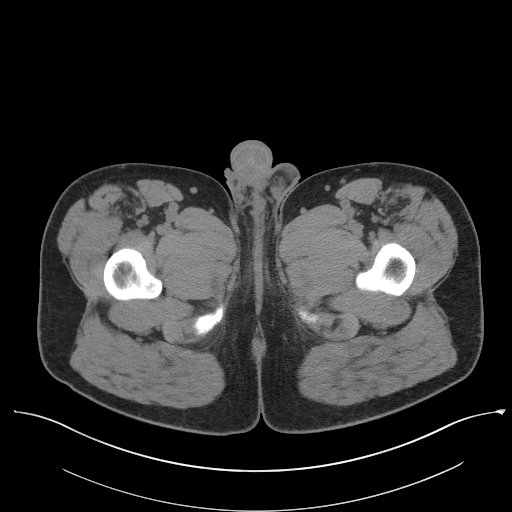
[im 5/101  bone]
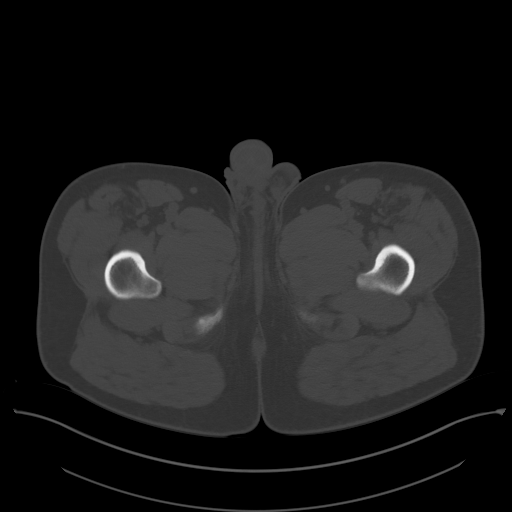
[im 14/101  soft-tissue]
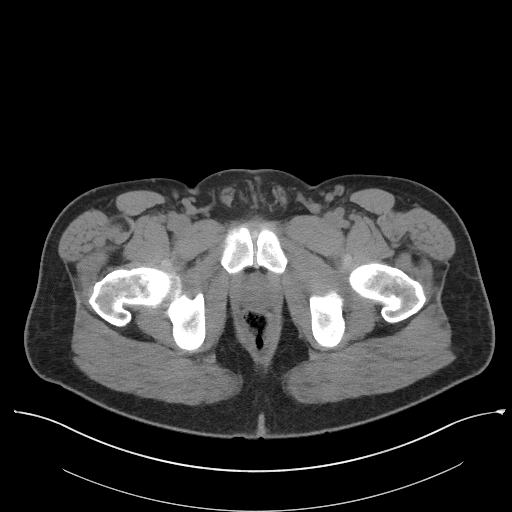
[im 18/101  soft-tissue]
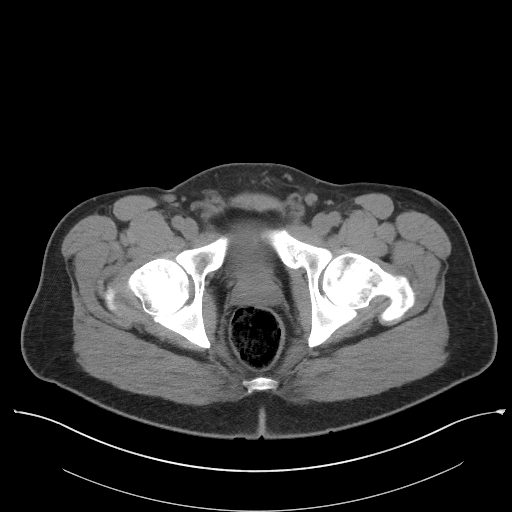
[im 27/101  soft-tissue]
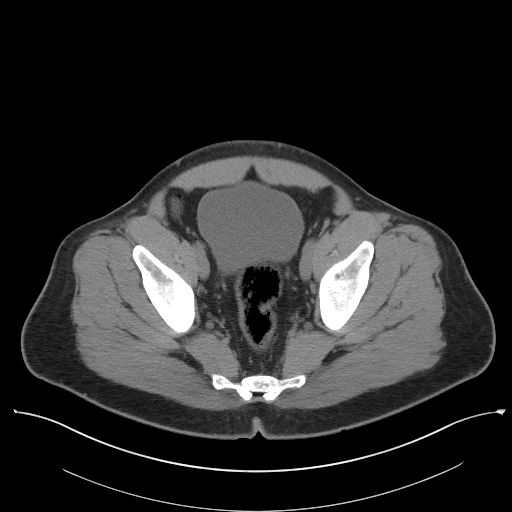
[im 35/101  soft-tissue]
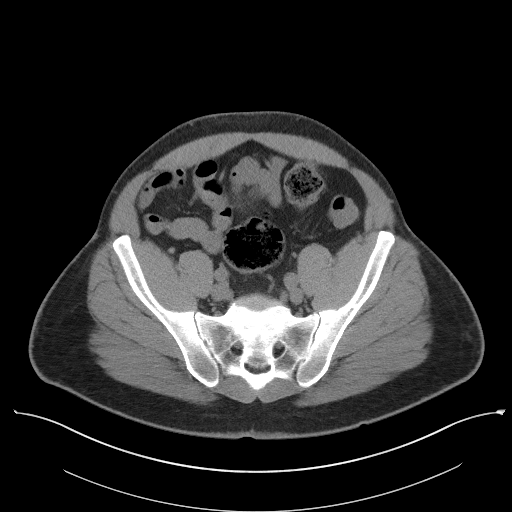
[im 40/101  soft-tissue]
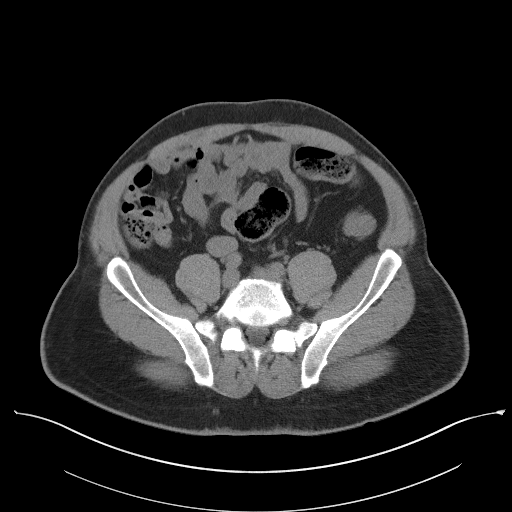
[im 48/101  soft-tissue]
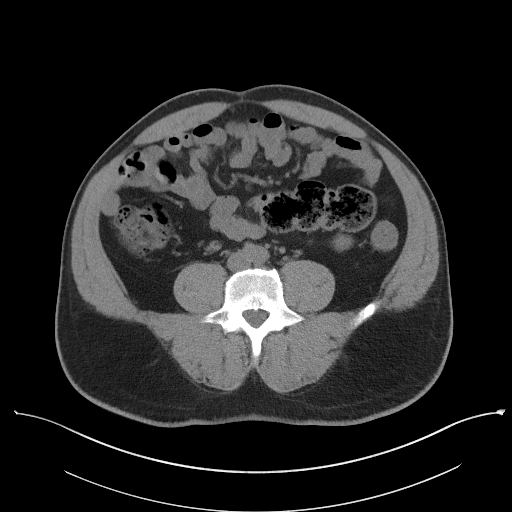
[im 53/101  soft-tissue]
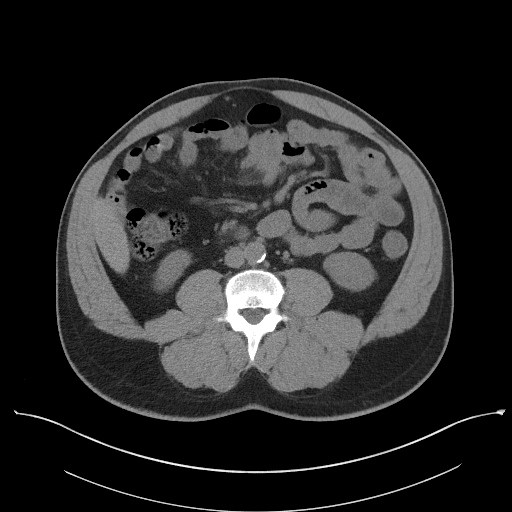
[im 61/101  soft-tissue]
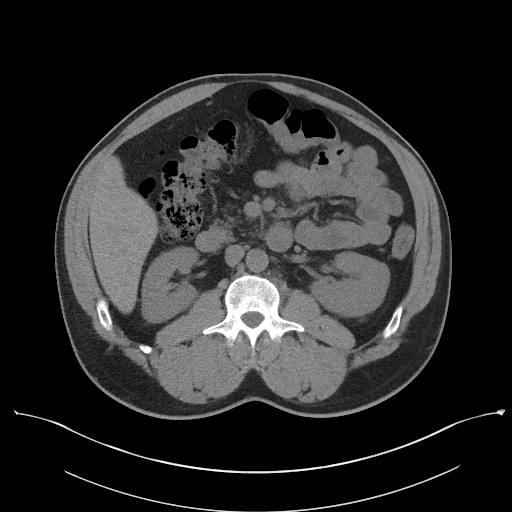
[im 61/101  bone]
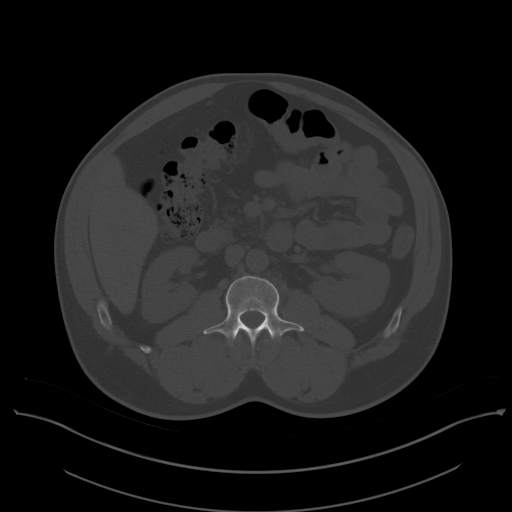
[im 66/101  soft-tissue]
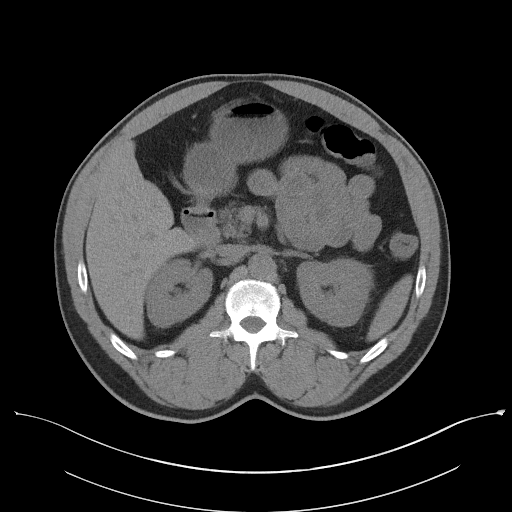
[im 74/101  soft-tissue]
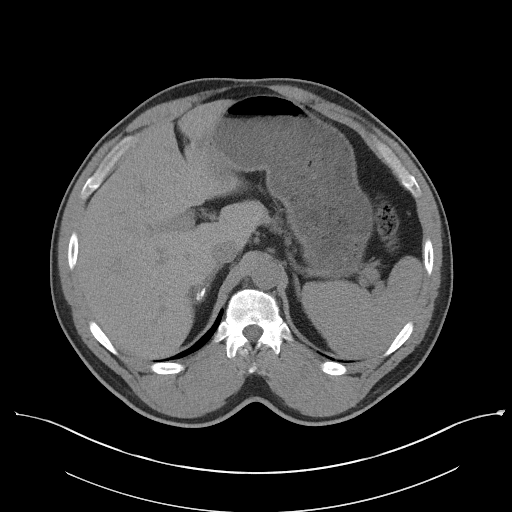
[im 83/101  soft-tissue]
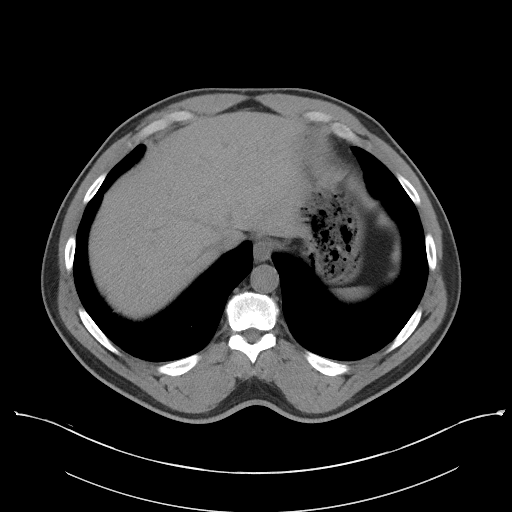
[im 87/101  soft-tissue]
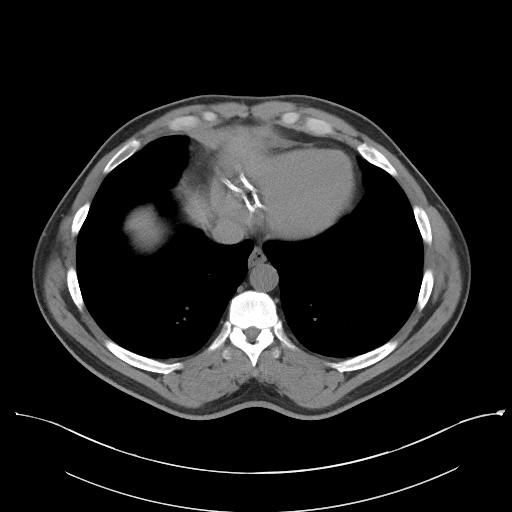
[im 96/101  soft-tissue]
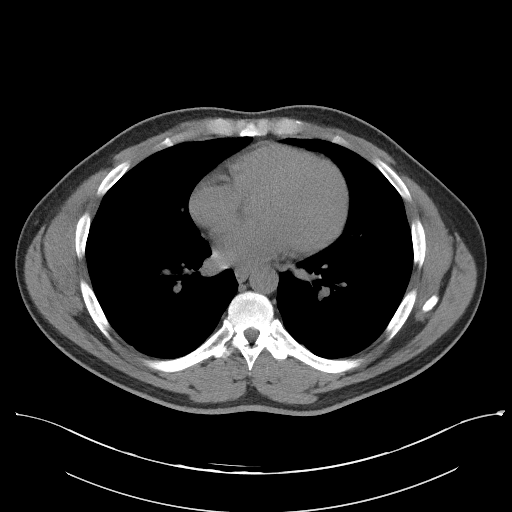

[Series 4: coronal st · coronal · 0.97mm/px · 3 of 96 slices shown]
[im 32/96  soft-tissue]
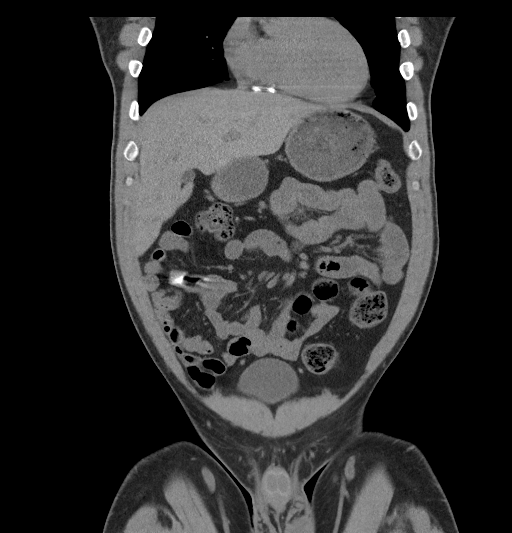
[im 43/96  soft-tissue]
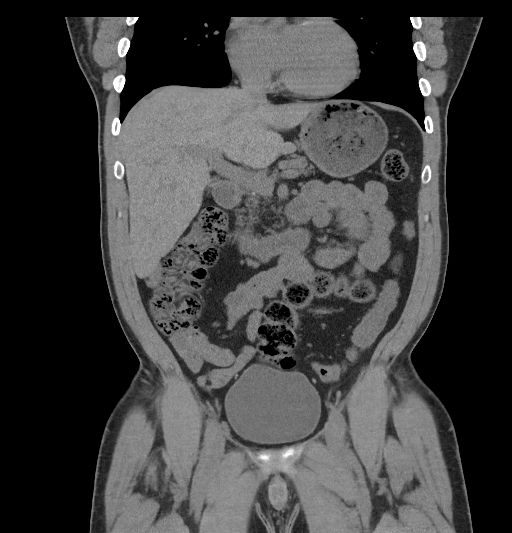
[im 53/96  soft-tissue]
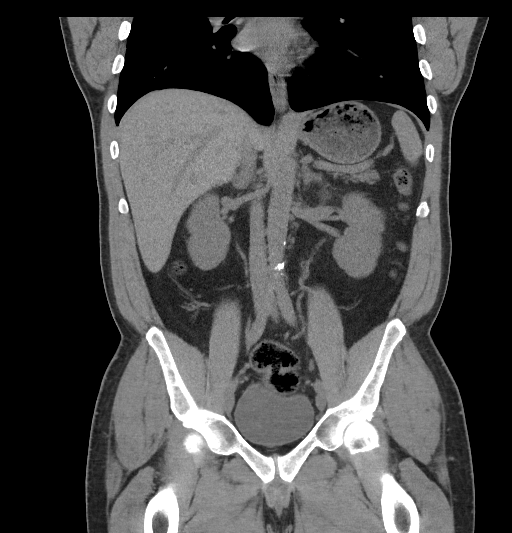

[17 of 46 positions shown; findings below may reference images not displayed]

FINDINGS: Lower chest: No acute abnormality.

Hepatobiliary: No focal liver abnormality is seen. No gallstones,
gallbladder wall thickening, or biliary dilatation.

Pancreas: Mild atrophy. No ductal dilatation or surrounding
inflammatory changes.

Spleen: Normal in size without focal abnormality.

Adrenals/Urinary Tract: Unchanged right adrenal calcifications. The
left adrenal gland is unremarkable. Small left renal cysts are
unchanged. No renal or ureteral calculi. No hydronephrosis. The
bladder is unremarkable.

Stomach/Bowel: Stomach is within normal limits. Appendix appears
normal. No evidence of bowel wall thickening, distention, or
inflammatory changes.

Vascular/Lymphatic: Aortic atherosclerosis. No enlarged abdominal or
pelvic lymph nodes.

Reproductive: Prostate is unremarkable.

Other: No abdominal wall hernia or abnormality. No abdominopelvic
ascites. No pneumoperitoneum.

Musculoskeletal: No acute or significant osseous findings.
IMPRESSION: 1.  No acute intra-abdominal process.

## 2018-09-23 MED ORDER — HYDROCODONE-ACETAMINOPHEN 5-325 MG PO TABS: 1.0000 | ORAL_TABLET | Freq: Four times a day (QID) | ORAL | 0 refills | Status: DC | PRN

## 2018-09-23 MED ORDER — NAPROXEN 500 MG PO TABS: 500.0000 mg | ORAL_TABLET | Freq: Two times a day (BID) | ORAL | 0 refills | Status: DC

## 2018-09-23 MED ORDER — OXYCODONE-ACETAMINOPHEN 5-325 MG PO TABS
1.0000 | ORAL_TABLET | Freq: Once | ORAL | Status: AC
  Administered 2018-09-23: 1 via ORAL
  Filled 2018-09-23: qty 1

## 2018-09-23 NOTE — ED Notes (Signed)
Chaperone for MD exam of testicle

## 2018-09-23 NOTE — Discharge Instructions (Addendum)
You were seen today for left testicle pain.  Your work-up so far is reassuring.  Return later today for ultrasound as scheduled.  Take medications as prescribed.

## 2018-09-23 NOTE — ED Triage Notes (Signed)
Pt c/o left testicle and left leg pain x 3 days.

## 2018-09-23 NOTE — ED Notes (Signed)
Pt states unable to urinate at this time. 

## 2018-09-23 NOTE — ED Provider Notes (Signed)
[facility] EMERGENCY DEPARTMENT Provider Note   CSN: [id number] Arrival date & time: 09/23/18  0215     History   Chief Complaint Chief Complaint  Patient presents with  . Testicle Pain    HPI Matthew Fisher is a 47 y.o. male.  HPI  This 47 year old male with history of coronary artery disease, diabetes, hypertension who presents with left testicle pain.  Patient reports 2 to 3-day history of worsening left testicle pain.  He states the pain starts in his groin radiates into his left testicle and left leg.  He has not noted any penile discharge.  He does report some urgency.  No dysuria or hematuria.  No back pain or fevers.  He denies any nausea, vomiting.  Currently rates his pain 8 out of 10.  He is not taking anything for the pain.  Nothing seems to make the pain better or worse.  No history of similar symptoms in the past.  Past Medical History:  Diagnosis Date  . Arthritis   . CAD (coronary artery disease)    a. NSTEMI 10/2017 s/p multivessel PCI of the OM1, PDA, PLA ostium and mid PLA.  . CKD (chronic kidney disease), stage II   . Gout   . Hypertension   . Insulin dependent diabetes mellitus [facility])     Patient Active Problem List   Diagnosis Date Noted  . CAD in native artery 10/18/2017  . CKD (chronic kidney disease), stage II 10/18/2017  . NSTEMI (non-ST elevated myocardial infarction) (HCC) 10/15/2017  . Type 2 diabetes mellitus with complication, with long-term current use of insulin (HCC) 10/15/2017  . Essential hypertension 10/15/2017  . Gastroparesis due to secondary diabetes (HCC) 10/15/2017  . Hypertensive emergency, no CHF   . Medial meniscus tear 01/22/2013    Past Surgical History:  Procedure Laterality Date  . CORONARY STENT INTERVENTION N/A 10/17/2017   Procedure: CORONARY STENT INTERVENTION;  Surgeon: Corky Crafts, MD;  Location: [facility] INVASIVE CV LAB;  Service: Cardiovascular;  Laterality: N/A;  . FRACTURE SURGERY     right ring  finger  . KNEE ARTHROSCOPY WITH LATERAL MENISECTOMY Left 01/22/2013   Procedure: LEFT KNEE ARTHROSCOPY DEBRIDEMENT CHONDROPLASTY, LATERAL RELEASE AND partial medial meniscectomy;  Surgeon: Javier Docker, MD;  Location: [facility] ORS;  Service: Orthopedics;  Laterality: Left;  . KNEE SURGERY     "removed bone"  . LEFT HEART CATH N/A 10/17/2017   Procedure: Left Heart Cath;  Surgeon: Corky Crafts, MD;  Location: [facility] INVASIVE CV LAB;  Service: Cardiovascular;  Laterality: N/A;  . LEFT HEART CATH AND CORONARY ANGIOGRAPHY N/A 10/15/2017   Procedure: LEFT HEART CATH AND CORONARY ANGIOGRAPHY;  Surgeon: Marykay Lex, MD;  Location: [facility] INVASIVE CV LAB;  Service: Cardiovascular;  Laterality: N/A;        Home Medications    Prior to Admission medications   Medication Sig Start Date End Date Taking? Authorizing Provider  amLODipine (NORVASC) 10 MG tablet Take 1 tablet (10 mg total) by mouth daily. 10/28/17   Jodelle Gross, NP  aspirin 81 MG chewable tablet Chew 1 tablet (81 mg total) by mouth daily. 10/20/17   Dunn, Raymon Mutton, PA-C  atorvastatin (LIPITOR) 80 MG tablet Take 1 tablet (80 mg total) by mouth daily at 6 PM. 10/28/17   Jodelle Gross, NP  CARAFATE 1 GM/10ML suspension Take 1 g by mouth 4 (four) times daily -  with meals and at bedtime.  10/10/17   [provider]  carvedilol (COREG) 25 MG tablet Take 1 tablet (25 mg total) by mouth 2 (two) times daily with a meal. 10/28/17   Jodelle GrossLawrence, Kathryn M, NP  clopidogrel (PLAVIX) 75 MG tablet Take 1 tablet (75 mg total) by mouth daily. 10/28/17   Jodelle GrossLawrence, Kathryn M, NP  HYDROcodone-acetaminophen (NORCO/VICODIN) 5-325 MG tablet Take 1-2 tablets by mouth every 6 (six) hours as needed. 09/23/18   Angelyne Terwilliger, Mayer Maskerourtney F, MD  insulin detemir (LEVEMIR) 100 UNIT/ML injection Inject 20 Units into the skin daily.    [provider]  metoCLOPramide (REGLAN) 10 MG tablet Take 1 tablet (10 mg total) by mouth every 6 (six) hours. 10/12/17    Rise MuLeaphart, Kenneth T, PA-C  naproxen (NAPROSYN) 500 MG tablet Take 1 tablet (500 mg total) by mouth 2 (two) times daily. 09/23/18   Author Hatlestad, Mayer Maskerourtney F, MD  promethazine (PHENERGAN) 25 MG suppository Place 1 suppository (25 mg total) rectally every 6 (six) hours as needed for nausea or vomiting. 10/12/17   Demetrios LollLeaphart, Kenneth T, PA-C  PROTONIX 40 MG tablet Take 40 mg by mouth 2 (two) times daily. 10/10/17   [provider]    Family History Family History  Problem Relation Age of Onset  . Diabetes Mother     Social History Social History   Tobacco Use  . Smoking status: Former Smoker    Packs/day: 0.50    Years: 20.00    Pack years: 10.00    Types: Cigarettes    Last attempt to quit: 10/08/2002    Years since quitting: 15.9  . Smokeless tobacco: Never Used  Substance Use Topics  . Alcohol use: Yes    Comment: occasional  . Drug use: No     Allergies   Patient has no known allergies.   Review of Systems Review of Systems  Constitutional: Negative for fever.  Respiratory: Negative for shortness of breath.   Cardiovascular: Negative for chest pain.  Gastrointestinal: Negative for abdominal pain, diarrhea, nausea and vomiting.       Left groin pain  Genitourinary: Positive for frequency and testicular pain. Negative for dysuria, hematuria, penile swelling and scrotal swelling.  All other systems reviewed and are negative.    Physical Exam Updated Vital Signs BP (!) 168/93 (BP Location: Left Arm)   Pulse 91   Temp 98 F (36.7 C) (Oral)   Resp 18   Ht 1.727 m (5\' 8" )   Wt 90.7 kg   SpO2 100%   BMI 30.41 kg/m   Physical Exam Vitals signs and nursing note reviewed.  Constitutional:      General: He is not in acute distress.    Appearance: He is well-developed. He is not ill-appearing.  HENT:     Head: Normocephalic and atraumatic.  Neck:     Musculoskeletal: Neck supple.  Cardiovascular:     Rate and Rhythm: Normal rate and regular rhythm.     Heart  sounds: Normal heart sounds. No murmur.  Pulmonary:     Effort: Pulmonary effort is normal. No respiratory distress.     Breath sounds: Normal breath sounds. No wheezing.  Abdominal:     General: Bowel sounds are normal.     Palpations: Abdomen is soft.     Tenderness: There is no abdominal tenderness. There is no rebound.  Genitourinary:    Scrotum/Testes: Normal.     Comments: Circumcised penis, normal testicular lie, positive cremasteric reflex, no swelling, redness, crepitus of the scrotum, patient does have some tenderness with a slight bulge proximal  to the testis, reducible on exam Skin:    General: Skin is warm and dry.  Neurological:     Mental Status: He is alert and oriented to person, place, and time.      ED Treatments / Results  Labs (all labs ordered are listed, but only abnormal results are displayed) Labs Reviewed  URINALYSIS, ROUTINE W REFLEX MICROSCOPIC - Abnormal; Notable for the following components:      Result Value   Color, Urine STRAW (*)    Specific Gravity, Urine <1.005 (*)    Glucose, UA >=500 (*)    Hgb urine dipstick SMALL (*)    All other components within normal limits  URINALYSIS, MICROSCOPIC (REFLEX) - Abnormal; Notable for the following components:   Bacteria, UA RARE (*)    All other components within normal limits    EKG None  Radiology Ct Renal Stone Study  Result Date: 09/23/2018 CLINICAL DATA:  Left testicle and leg pain for the past 3 days. Unable to urinate. EXAM: CT ABDOMEN AND PELVIS WITHOUT CONTRAST TECHNIQUE: Multidetector CT imaging of the abdomen and pelvis was performed following the standard protocol without IV contrast. COMPARISON:  CT abdomen pelvis dated October 07, 2017. FINDINGS: Lower chest: No acute abnormality. Hepatobiliary: No focal liver abnormality is seen. No gallstones, gallbladder wall thickening, or biliary dilatation. Pancreas: Mild atrophy. No ductal dilatation or surrounding inflammatory changes. Spleen:  Normal in size without focal abnormality. Adrenals/Urinary Tract: Unchanged right adrenal calcifications. The left adrenal gland is unremarkable. Small left renal cysts are unchanged. No renal or ureteral calculi. No hydronephrosis. The bladder is unremarkable. Stomach/Bowel: Stomach is within normal limits. Appendix appears normal. No evidence of bowel wall thickening, distention, or inflammatory changes. Vascular/Lymphatic: Aortic atherosclerosis. No enlarged abdominal or pelvic lymph nodes. Reproductive: Prostate is unremarkable. Other: No abdominal wall hernia or abnormality. No abdominopelvic ascites. No pneumoperitoneum. Musculoskeletal: No acute or significant osseous findings. IMPRESSION: 1.  No acute intra-abdominal process. Electronically Signed   By: Obie Dredge M.D.   On: 09/23/2018 03:48    Procedures Procedures (including critical care time)  Medications Ordered in ED Medications  oxyCODONE-acetaminophen (PERCOCET/ROXICET) 5-325 MG per tablet 1 tablet (1 tablet Oral Given 09/23/18 0248)     Initial Impression / Assessment and Plan / ED Course  I have reviewed the triage vital signs and the nursing notes.  Pertinent labs & imaging results that were available during my care of the patient were reviewed by me and considered in my medical decision making (see chart for details).     Patient presents with left testicle pain.  3 days in duration.  He is overall nontoxic and vital signs are reassuring.  On physical exam, there is no clinical evidence of torsion, swelling, cellulitis.  Possibly a small hernia that is reducible.  Patient was given pain medication.  Urinalysis is without evidence of infection.  CT stone study obtained given groin and leg pain with radiation as well.  CT stone study is negative.  Will have patient return for ultrasound later today to further evaluate.  Pain medications and anti-inflammatories in the meantime.  After history, exam, and medical workup I  feel the patient has been appropriately medically screened and is safe for discharge home. Pertinent diagnoses were discussed with the patient. Patient was given return precautions.   Final Clinical Impressions(s) / ED Diagnoses   Final diagnoses:  Left testicular pain    ED Discharge Orders         Ordered  naproxen (NAPROSYN) 500 MG tablet  2 times daily     09/23/18 0526    HYDROcodone-acetaminophen (NORCO/VICODIN) 5-325 MG tablet  Every 6 hours PRN     09/23/18 0526    US Scrotum     09/23/18 0526    US PELVIC DOPPLER (TORSION R/O OR MASS ARTERIAL FLOW)     09/23/18 4098           Shon Baton, MD 09/23/18 [phone]

## 2019-03-27 ENCOUNTER — Emergency Department (HOSPITAL_BASED_OUTPATIENT_CLINIC_OR_DEPARTMENT_OTHER): Payer: Self-pay

## 2019-03-27 ENCOUNTER — Other Ambulatory Visit: Payer: Self-pay

## 2019-03-27 ENCOUNTER — Encounter (HOSPITAL_BASED_OUTPATIENT_CLINIC_OR_DEPARTMENT_OTHER): Payer: Self-pay

## 2019-03-27 ENCOUNTER — Emergency Department (HOSPITAL_BASED_OUTPATIENT_CLINIC_OR_DEPARTMENT_OTHER)
Admission: EM | Admit: 2019-03-27 | Discharge: 2019-03-27 | Disposition: A | Discharge status: Home / Self Care | Payer: Self-pay | Attending: Emergency Medicine | Admitting: Emergency Medicine

## 2019-03-27 DIAGNOSIS — I252 Old myocardial infarction: Secondary | ICD-10-CM | POA: Insufficient documentation

## 2019-03-27 DIAGNOSIS — I2510 Atherosclerotic heart disease of native coronary artery without angina pectoris: Secondary | ICD-10-CM | POA: Insufficient documentation

## 2019-03-27 DIAGNOSIS — M2540 Effusion, unspecified joint: Secondary | ICD-10-CM

## 2019-03-27 DIAGNOSIS — I129 Hypertensive chronic kidney disease with stage 1 through stage 4 chronic kidney disease, or unspecified chronic kidney disease: Secondary | ICD-10-CM | POA: Insufficient documentation

## 2019-03-27 DIAGNOSIS — Z79899 Other long term (current) drug therapy: Secondary | ICD-10-CM | POA: Insufficient documentation

## 2019-03-27 DIAGNOSIS — Z794 Long term (current) use of insulin: Secondary | ICD-10-CM | POA: Insufficient documentation

## 2019-03-27 DIAGNOSIS — Z7982 Long term (current) use of aspirin: Secondary | ICD-10-CM | POA: Insufficient documentation

## 2019-03-27 DIAGNOSIS — Z87891 Personal history of nicotine dependence: Secondary | ICD-10-CM | POA: Insufficient documentation

## 2019-03-27 DIAGNOSIS — N182 Chronic kidney disease, stage 2 (mild): Secondary | ICD-10-CM | POA: Insufficient documentation

## 2019-03-27 DIAGNOSIS — E1122 Type 2 diabetes mellitus with diabetic chronic kidney disease: Secondary | ICD-10-CM | POA: Insufficient documentation

## 2019-03-27 DIAGNOSIS — R2231 Localized swelling, mass and lump, right upper limb: Secondary | ICD-10-CM | POA: Insufficient documentation

## 2019-03-27 IMAGING — DX RIGHT HAND - COMPLETE 3+ VIEW
3 series · 3 of 3 positions shown · non-contrast
Comparison: Right hand radiograph [DATE]

CLINICAL DATA: Worsening right hand and elbow swelling over the
past month. No known injury. History of gout.

EXAM:
RIGHT HAND - COMPLETE 3+ VIEW

[hand pa]
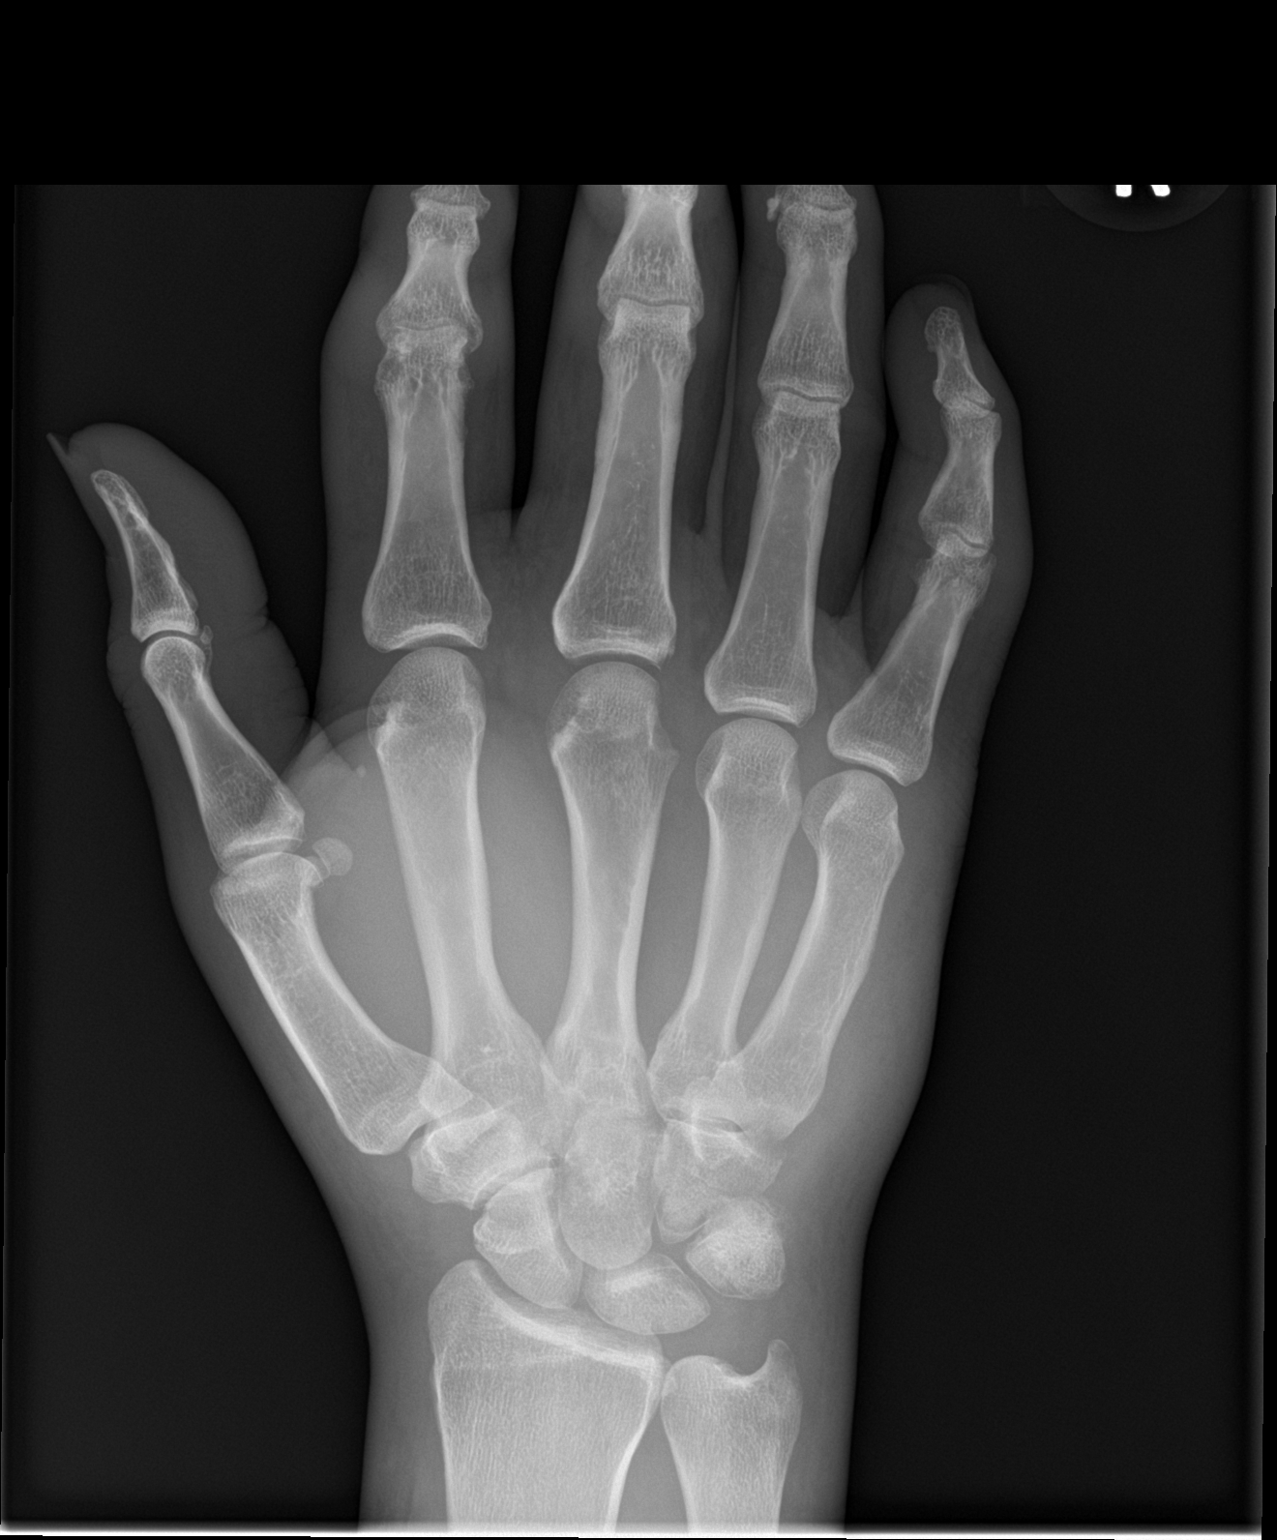

[hand obl]
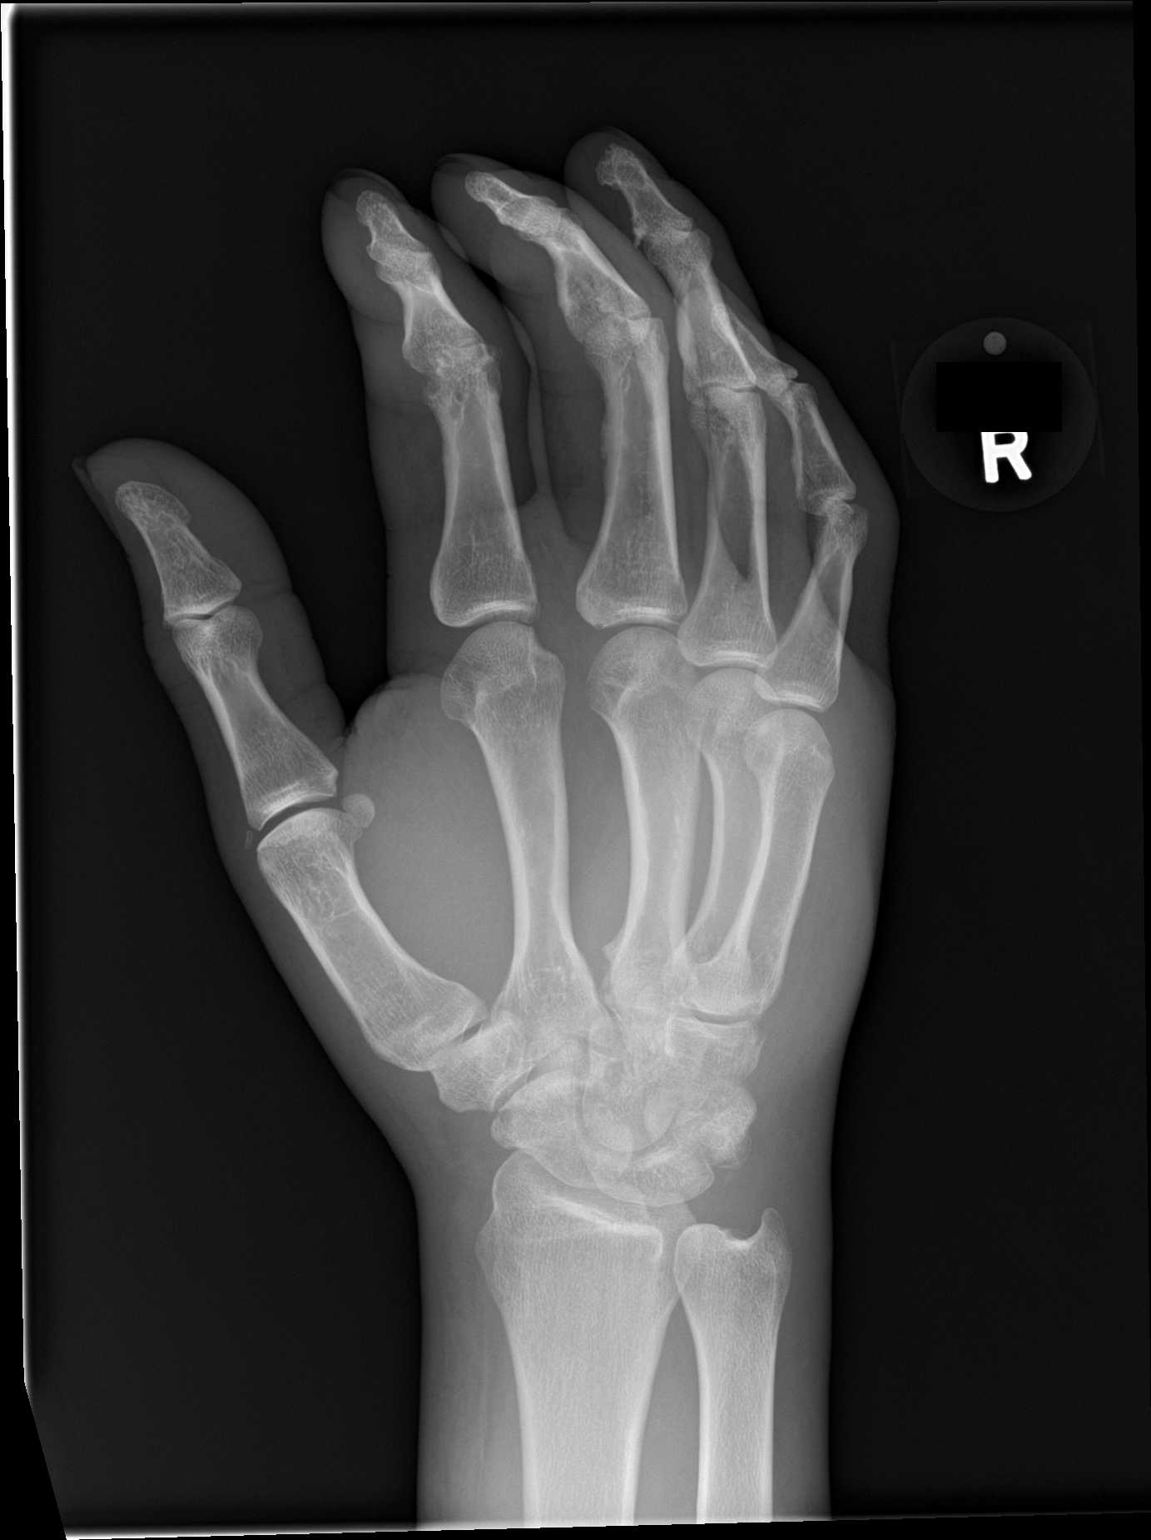

[hand lat]
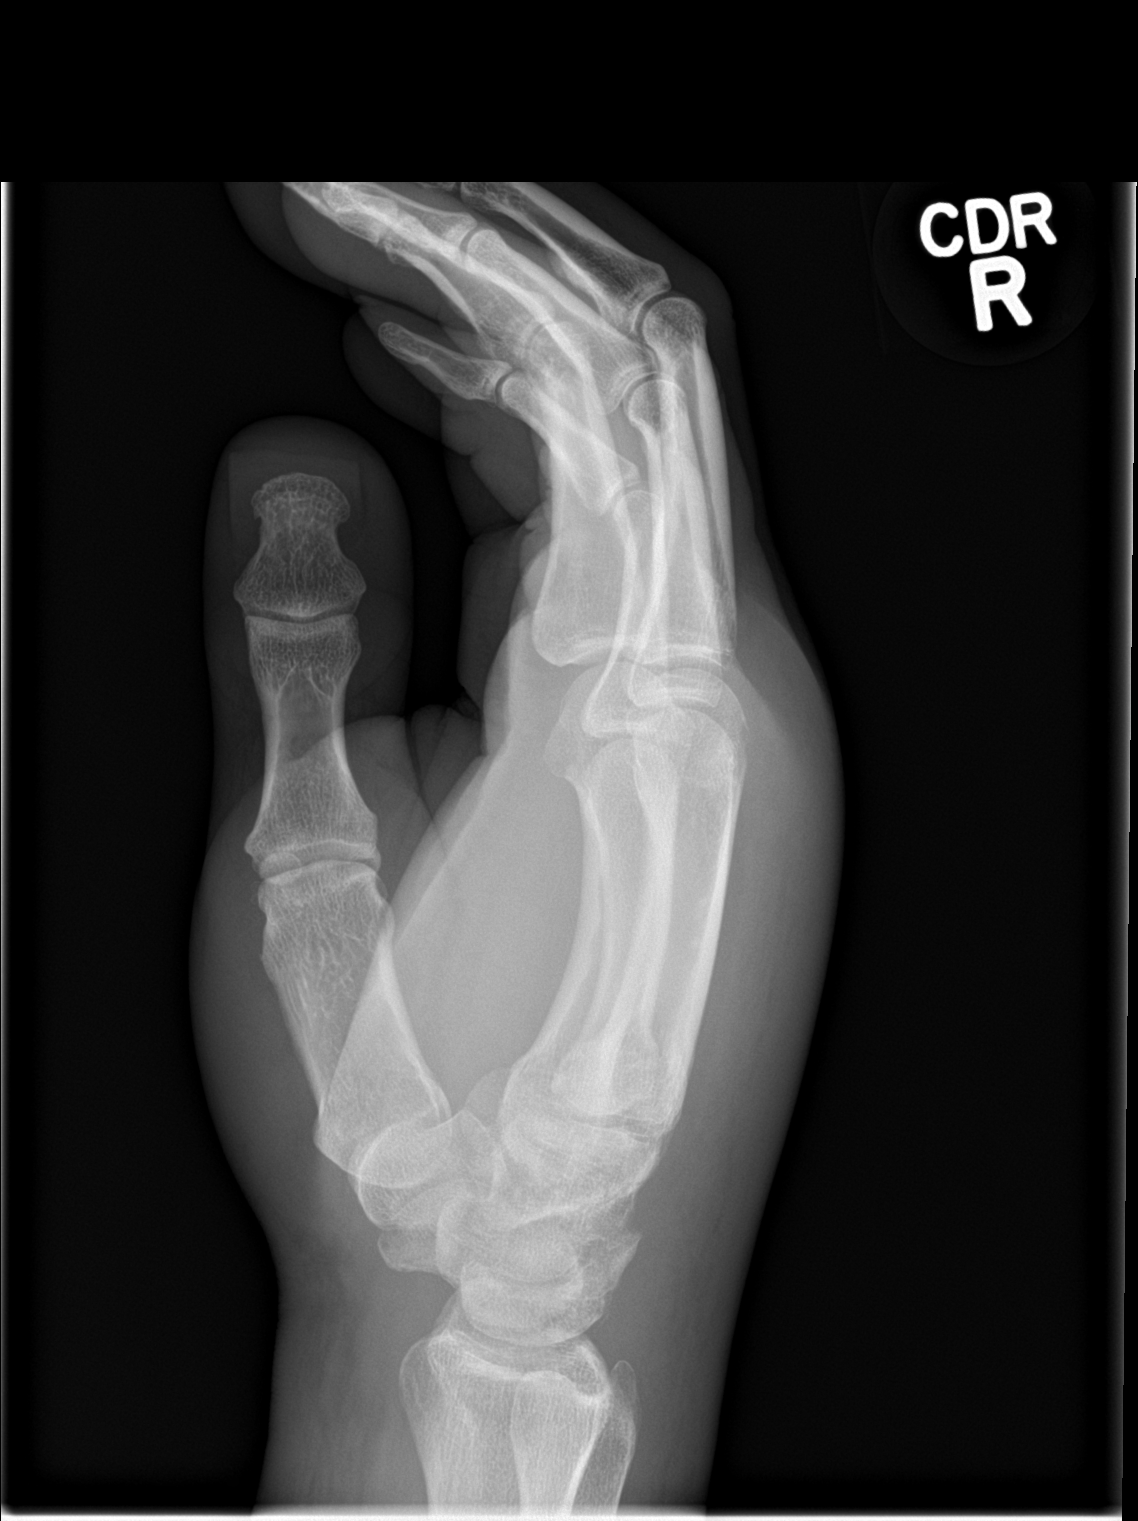

[3 of 3 positions shown; findings below may reference images not displayed]

FINDINGS: Bony erosions about the second digit proximal interphalangeal joint
with associated soft tissue edema. Suspect bony erosions about the
fifth proximal interphalangeal joint. Decreased density at the
carpal metacarpal articulation of the fifth ray may be additional
osseous erosions. Small round lucency in the distal scaphoid may be
a degenerative carpal bone cysts or additional erosion. No fracture.
Generalized soft tissue edema, prominent over the dorsum of the
hand.
IMPRESSION: 1. Bony erosions about the second digit proximal interphalangeal
joint with associated soft tissue edema. Suspect additional osseous
erosions about the fifth proximal interphalangeal joint. Findings
consistent with erosive arthropathy, likely gout given reported
history.
2. Additional round lucency in the distal scaphoid may be
degenerative carpal bone cysts or additional erosion.
3. Generalized soft tissue edema of the hand.

## 2019-03-27 IMAGING — DX RIGHT ELBOW - COMPLETE 3+ VIEW
4 series · 4 of 4 positions shown · non-contrast
Comparison: None.

CLINICAL DATA: Worsening right elbow swelling over the past month.
History of gout.

EXAM:
RIGHT ELBOW - COMPLETE 3+ VIEW

[elbow obl (1 of 2)]
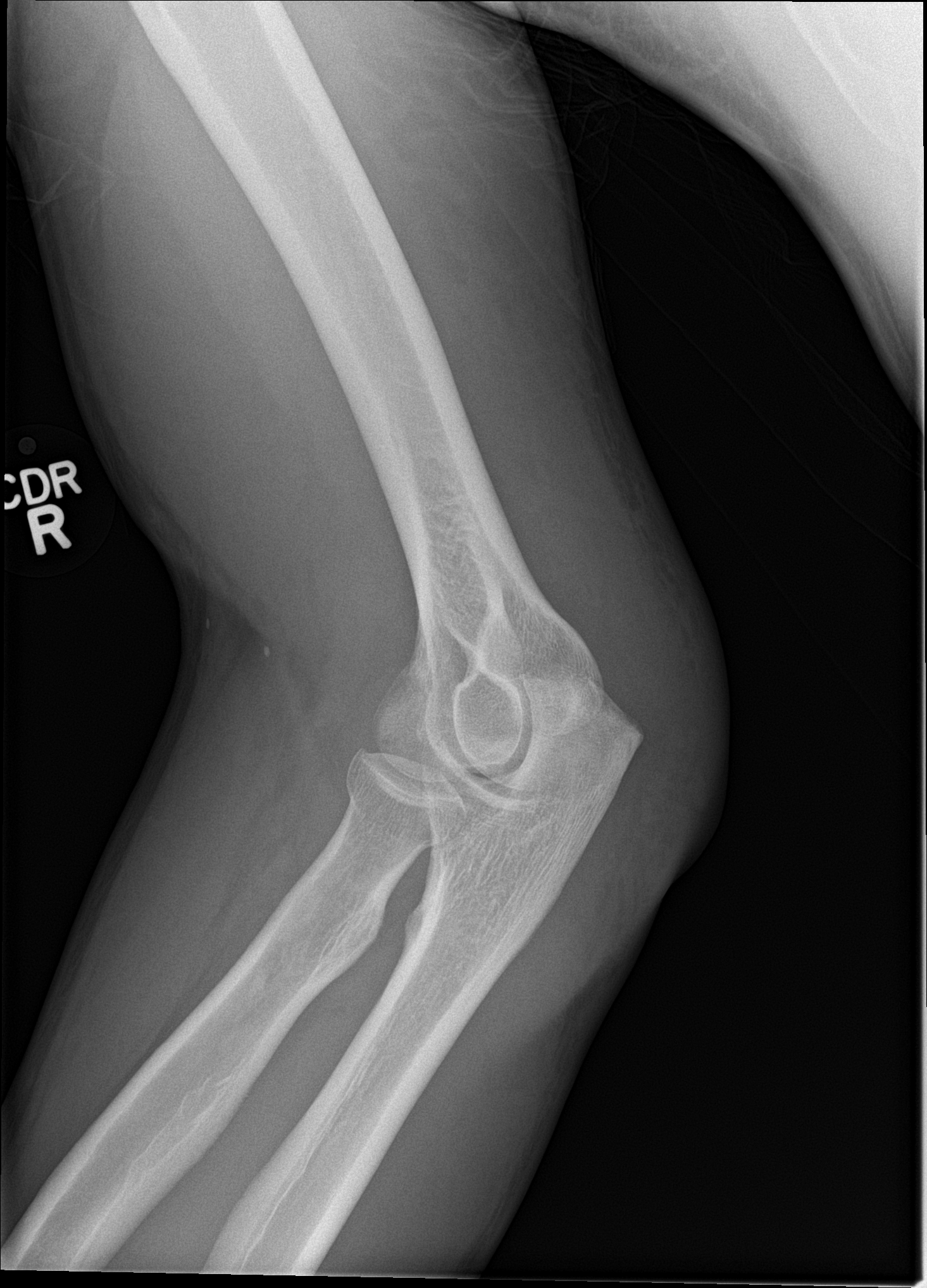

[elbow obl (2 of 2)]
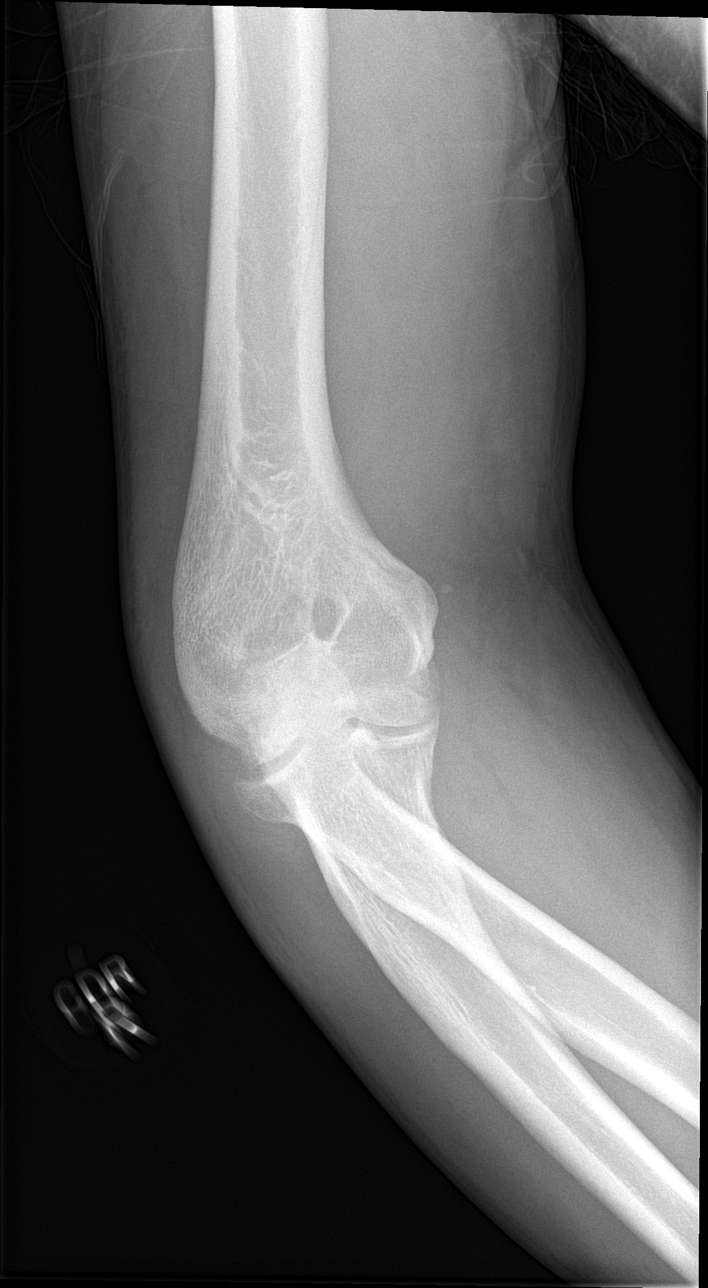

[elbow lat (1 of 2)]
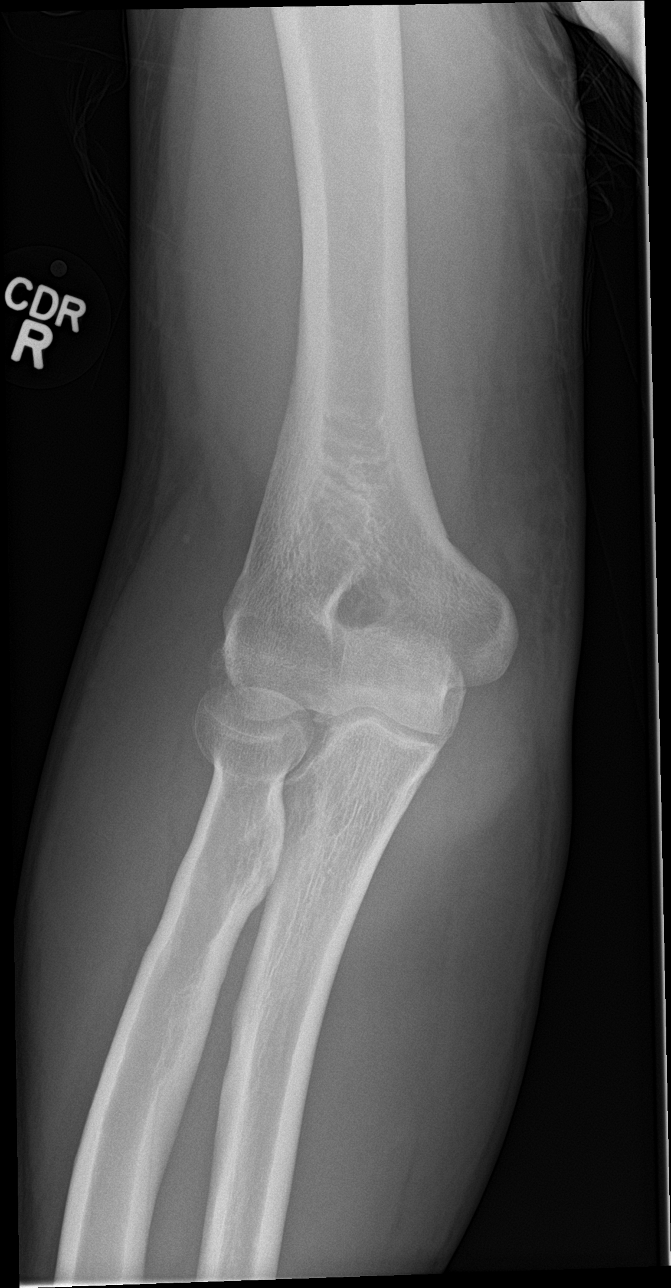

[elbow lat (2 of 2)]
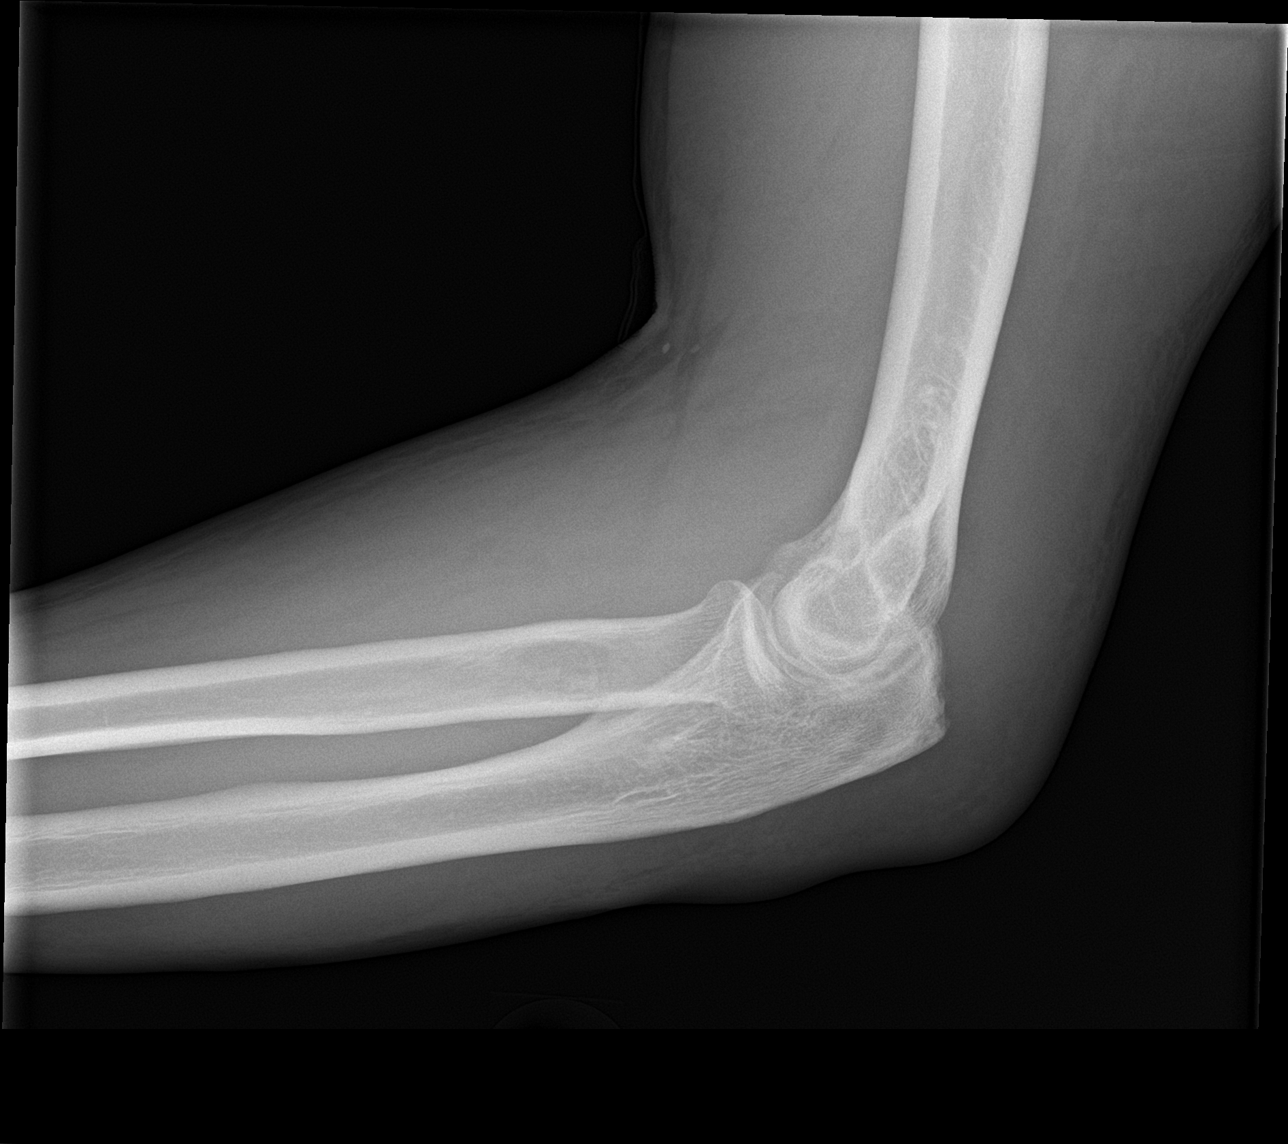

[4 of 4 positions shown; findings below may reference images not displayed]

FINDINGS: There is no evidence of fracture, dislocation, or joint effusion.
Slight subcortical irregularity about the posterior olecranon
without large erosions. Generalized soft tissue edema with slight
prominence in the region of the olecranon bursa.
IMPRESSION: Soft tissue edema most prominent overlying the olecranon bursa,
suggesting olecranon bursitis. Slight subcortical irregularity about
the posterior olecranon may be reactive.

## 2019-03-27 MED ORDER — HYDROCODONE-ACETAMINOPHEN 5-325 MG PO TABS
1.0000 | ORAL_TABLET | Freq: Once | ORAL | Status: AC
  Administered 2019-03-27: 1 via ORAL
  Filled 2019-03-27: qty 1

## 2019-03-27 MED ORDER — HYDROCODONE-ACETAMINOPHEN 5-325 MG PO TABS: 1.0000 | ORAL_TABLET | Freq: Four times a day (QID) | ORAL | 0 refills | Status: DC | PRN

## 2019-03-27 MED ORDER — COLCHICINE 0.6 MG PO TABS: 0.6000 mg | ORAL_TABLET | Freq: Two times a day (BID) | ORAL | 0 refills | Status: DC

## 2019-03-27 MED ORDER — COLCHICINE 0.6 MG PO TABS
1.2000 mg | ORAL_TABLET | Freq: Once | ORAL | Status: AC
  Administered 2019-03-27: 1.2 mg via ORAL
  Filled 2019-03-27: qty 2

## 2019-03-27 NOTE — ED Triage Notes (Signed)
Pt c/o swelling to right hand and right elbow x 5 weeks-denies injury + gout hx-NAD-steady gait

## 2019-03-27 NOTE — ED Provider Notes (Signed)
[facility] EMERGENCY DEPARTMENT Provider Note   CSN: [id number] Arrival date & time: 03/27/19  1853    History   Chief Complaint Chief Complaint  Patient presents with  . Joint Swelling    HPI Matthew Fisher is a 48 y.o. male.     The history is provided by the patient and medical records. No language interpreter was used.     Matthew Fisher is a 48 y.o. male  with a PMH as listed below who presents to the Emergency Department complaining of persistent right upper extremity swelling for the last 5 weeks.  Initially started to the wrist/hand.  Felt similar to his previous gout flares which he has had in this joint before.  Patient states that he started taking allopurinol, but this did not help.  Symptoms persisted.  About 2 weeks ago, he started having swelling to the elbow of the right extremity as well.  No wound to the skin.  Pain worse with movement.  Has not noticed any redness or warmth.  No fevers.    Past Medical History:  Diagnosis Date  . Arthritis   . CAD (coronary artery disease)    a. NSTEMI 10/2017 s/p multivessel PCI of the OM1, PDA, PLA ostium and mid PLA.  . CKD (chronic kidney disease), stage II   . Gout   . Hypertension   . Insulin dependent diabetes mellitus [facility](HCC)     Patient Active Problem List   Diagnosis Date Noted  . CAD in native artery 10/18/2017  . CKD (chronic kidney disease), stage II 10/18/2017  . NSTEMI (non-ST elevated myocardial infarction) (HCC) 10/15/2017  . Type 2 diabetes mellitus with complication, with long-term current use of insulin (HCC) 10/15/2017  . Essential hypertension 10/15/2017  . Gastroparesis due to secondary diabetes (HCC) 10/15/2017  . Hypertensive emergency, no CHF   . Medial meniscus tear 01/22/2013    Past Surgical History:  Procedure Laterality Date  . CORONARY STENT INTERVENTION N/A 10/17/2017   Procedure: CORONARY STENT INTERVENTION;  Surgeon: Corky CraftsVaranasi, Jayadeep S, MD;  [facility];  Service: Cardiovascular;  Laterality: N/A;  . FRACTURE SURGERY     right ring finger  . KNEE ARTHROSCOPY WITH LATERAL MENISECTOMY Left 01/22/2013   Procedure: LEFT KNEE ARTHROSCOPY DEBRIDEMENT CHONDROPLASTY, LATERAL RELEASE AND partial medial meniscectomy;  Surgeon: Javier DockerJeffrey C Beane, MD;  Location: [facility] ORS;  Service: Orthopedics;  Laterality: Left;  . KNEE SURGERY     "removed bone"  . LEFT HEART CATH N/A 10/17/2017   Procedure: Left Heart Cath;  Surgeon: Corky CraftsVaranasi, Jayadeep S, MD;  [facility];  Service: Cardiovascular;  Laterality: N/A;  . LEFT HEART CATH AND CORONARY ANGIOGRAPHY N/A 10/15/2017   Procedure: LEFT HEART CATH AND CORONARY ANGIOGRAPHY;  Surgeon: Marykay LexHarding, David W, MD;  [facility];  Service: Cardiovascular;  Laterality: N/A;        Home Medications    Prior to Admission medications   Medication Sig Start Date End Date Taking? Authorizing Provider  amLODipine (NORVASC) 10 MG tablet Take 1 tablet (10 mg total) by mouth daily. 10/28/17   Jodelle GrossLawrence, Kathryn M, NP  aspirin 81 MG chewable tablet Chew 1 tablet (81 mg total) by mouth daily. 10/20/17   Dunn, Raymon Muttonyan M, PA-C  atorvastatin (LIPITOR) 80 MG tablet Take 1 tablet (80 mg total) by mouth daily at 6 PM. 10/28/17   Jodelle GrossLawrence, Kathryn M, NP  CARAFATE 1 GM/10ML suspension Take 1 g by mouth 4 (four)  times daily -  with meals and at bedtime.  10/10/17   [provider]  carvedilol (COREG) 25 MG tablet Take 1 tablet (25 mg total) by mouth 2 (two) times daily with a meal. 10/28/17   Jodelle GrossLawrence, Kathryn M, NP  clopidogrel (PLAVIX) 75 MG tablet Take 1 tablet (75 mg total) by mouth daily. 10/28/17   Jodelle GrossLawrence, Kathryn M, NP  colchicine 0.6 MG tablet Take 1 tablet (0.6 mg total) by mouth 2 (two) times daily. 03/27/19   Ward, Chase PicketJaime Pilcher, PA-C  HYDROcodone-acetaminophen (NORCO/VICODIN) 5-325 MG tablet Take 1 tablet by mouth every 6 (six) hours as needed for severe pain. 03/27/19   Ward, Chase PicketJaime Pilcher, PA-C   insulin detemir (LEVEMIR) 100 UNIT/ML injection Inject 20 Units into the skin daily.    [provider]  metoCLOPramide (REGLAN) 10 MG tablet Take 1 tablet (10 mg total) by mouth every 6 (six) hours. 10/12/17   Rise MuLeaphart, Kenneth T, PA-C  naproxen (NAPROSYN) 500 MG tablet Take 1 tablet (500 mg total) by mouth 2 (two) times daily. 09/23/18   Horton, Mayer Maskerourtney F, MD  promethazine (PHENERGAN) 25 MG suppository Place 1 suppository (25 mg total) rectally every 6 (six) hours as needed for nausea or vomiting. 10/12/17   Demetrios LollLeaphart, Kenneth T, PA-C  PROTONIX 40 MG tablet Take 40 mg by mouth 2 (two) times daily. 10/10/17   [provider]    Family History Family History  Problem Relation Age of Onset  . Diabetes Mother     Social History Social History   Tobacco Use  . Smoking status: Former Smoker    Packs/day: 0.50    Years: 20.00    Pack years: 10.00    Types: Cigarettes    Quit date: 10/08/2002    Years since quitting: 16.4  . Smokeless tobacco: Never Used  Substance Use Topics  . Alcohol use: Yes    Comment: occasional  . Drug use: No     Allergies   Patient has no known allergies.   Review of Systems Review of Systems  Musculoskeletal: Positive for arthralgias, joint swelling and myalgias.  All other systems reviewed and are negative.    Physical Exam Updated Vital Signs BP (!) 154/84 (BP Location: Left Arm)   Pulse 87   Temp 98.2 F (36.8 C) (Oral)   Resp 20   Ht 5\' 8"  (1.727 m)   Wt 92.1 kg   SpO2 100%   BMI 30.87 kg/m   Physical Exam Vitals signs and nursing note reviewed.  Constitutional:      General: He is not in acute distress.    Appearance: He is well-developed.  HENT:     Head: Normocephalic and atraumatic.  Neck:     Musculoskeletal: Neck supple.  Cardiovascular:     Rate and Rhythm: Normal rate and regular rhythm.     Heart sounds: Normal heart sounds. No murmur.  Pulmonary:     Effort: Pulmonary effort is normal. No respiratory  distress.     Breath sounds: Normal breath sounds.  Musculoskeletal:     Comments: Tenderness and swelling to right wrist and dorsum of the right hand. He also has tenderness/swelling to right elbow. No overlying erythema. No wounds. Not warm to the touch. Decreased ROM likely 2/2 pain. 2+ radial pulse. Sensation intact.   Skin:    General: Skin is warm and dry.  Neurological:     Mental Status: He is alert and oriented to person, place, and time.  Psychiatric:  Mood and Affect: Mood normal.        Behavior: Behavior normal.      ED Treatments / Results  Labs (all labs ordered are listed, but only abnormal results are displayed) Labs Reviewed - No data to display  EKG None  Radiology Dg Elbow Complete Right  Result Date: 03/27/2019 CLINICAL DATA:  Worsening right elbow swelling over the past month. History of gout. EXAM: RIGHT ELBOW - COMPLETE 3+ VIEW COMPARISON:  None. FINDINGS: There is no evidence of fracture, dislocation, or joint effusion. Slight subcortical irregularity about the posterior olecranon without large erosions. Generalized soft tissue edema with slight prominence in the region of the olecranon bursa. IMPRESSION: Soft tissue edema most prominent overlying the olecranon bursa, suggesting olecranon bursitis. Slight subcortical irregularity about the posterior olecranon may be reactive. Electronically Signed   By: Narda RutherfordMelanie  Sanford M.D.   On: 03/27/2019 21:39   Dg Hand Complete Right  Result Date: 03/27/2019 CLINICAL DATA:  Worsening right hand and elbow swelling over the past month. No known injury. History of gout. EXAM: RIGHT HAND - COMPLETE 3+ VIEW COMPARISON:  Right hand radiograph 10/21/2013 FINDINGS: Bony erosions about the second digit proximal interphalangeal joint with associated soft tissue edema. Suspect bony erosions about the fifth proximal interphalangeal joint. Decreased density at the carpal metacarpal articulation of the fifth ray may be additional  osseous erosions. Small round lucency in the distal scaphoid may be a degenerative carpal bone cysts or additional erosion. No fracture. Generalized soft tissue edema, prominent over the dorsum of the hand. IMPRESSION: 1. Bony erosions about the second digit proximal interphalangeal joint with associated soft tissue edema. Suspect additional osseous erosions about the fifth proximal interphalangeal joint. Findings consistent with erosive arthropathy, likely gout given reported history. 2. Additional round lucency in the distal scaphoid may be degenerative carpal bone cysts or additional erosion. 3. Generalized soft tissue edema of the hand. Electronically Signed   By: Narda RutherfordMelanie  Sanford M.D.   On: 03/27/2019 21:38    Procedures Procedures (including critical care time)  Medications Ordered in ED Medications  colchicine tablet 1.2 mg (1.2 mg Oral Given 03/27/19 2056)  HYDROcodone-acetaminophen (NORCO/VICODIN) 5-325 MG per tablet 1 tablet (1 tablet Oral Given 03/27/19 2057)     Initial Impression / Assessment and Plan / ED Course  I have reviewed the triage vital signs and the nursing notes.  Pertinent labs & imaging results that were available during my care of the patient were reviewed by me and considered in my medical decision making (see chart for details).       Matthew Fisher is a 48 y.o. male who presents to ED for persistent right wrist pain / swelling x 5 weeks with right elbow swelling x 2 weeks. Started taking allopurinol when symptoms began. Hx of gout and this feels similar, but typically flares resolve much sooner than this. No warmth or erythema. Doubt septic joint. No hx of coagulopathy and swelling appears to be localized to joints, doubt clot. X-rays reviewed with attending, Dr. Rubin PayorPickering. Likely gout nad will treat as such.  ECP follow-up strongly encouraged.  Reasons to return to the emergency department were discussed and all questions were answered.  Patient discussed with  Dr. Rubin PayorPickering who agrees with treatment plan.    Final Clinical Impressions(s) / ED Diagnoses   Final diagnoses:  Joint swelling    ED Discharge Orders         Ordered    colchicine 0.6 MG tablet  2 times daily  03/27/19 2207    HYDROcodone-acetaminophen (NORCO/VICODIN) 5-325 MG tablet  Every 6 hours PRN     03/27/19 2207           Ward, Ozella Almond, PA-C 03/27/19 2209    Davonna Belling, MD 03/27/19 [phone]

## 2019-03-27 NOTE — Discharge Instructions (Addendum)
It was my pleasure taking care of you today!   Colchicine to help with gout flare. Pain medication only as needed for severe pain.   Call your primary care doctor on Monday to schedule a follow-up appointment.  Return to the emergency department for new or worsening symptoms, any additional concerns.

## 2019-07-04 ENCOUNTER — Emergency Department (HOSPITAL_BASED_OUTPATIENT_CLINIC_OR_DEPARTMENT_OTHER)
Admission: EM | Admit: 2019-07-04 | Discharge: 2019-07-04 | Disposition: A | Discharge status: Home / Self Care | Payer: Self-pay | Attending: Emergency Medicine | Admitting: Emergency Medicine

## 2019-07-04 ENCOUNTER — Emergency Department (HOSPITAL_BASED_OUTPATIENT_CLINIC_OR_DEPARTMENT_OTHER): Payer: Self-pay

## 2019-07-04 ENCOUNTER — Encounter (HOSPITAL_BASED_OUTPATIENT_CLINIC_OR_DEPARTMENT_OTHER): Payer: Self-pay | Admitting: Emergency Medicine

## 2019-07-04 ENCOUNTER — Other Ambulatory Visit: Payer: Self-pay

## 2019-07-04 DIAGNOSIS — K5900 Constipation, unspecified: Secondary | ICD-10-CM | POA: Insufficient documentation

## 2019-07-04 DIAGNOSIS — Z794 Long term (current) use of insulin: Secondary | ICD-10-CM | POA: Insufficient documentation

## 2019-07-04 DIAGNOSIS — K602 Anal fissure, unspecified: Secondary | ICD-10-CM | POA: Insufficient documentation

## 2019-07-04 DIAGNOSIS — E1122 Type 2 diabetes mellitus with diabetic chronic kidney disease: Secondary | ICD-10-CM | POA: Insufficient documentation

## 2019-07-04 DIAGNOSIS — Z79899 Other long term (current) drug therapy: Secondary | ICD-10-CM | POA: Insufficient documentation

## 2019-07-04 DIAGNOSIS — I129 Hypertensive chronic kidney disease with stage 1 through stage 4 chronic kidney disease, or unspecified chronic kidney disease: Secondary | ICD-10-CM | POA: Insufficient documentation

## 2019-07-04 DIAGNOSIS — F17210 Nicotine dependence, cigarettes, uncomplicated: Secondary | ICD-10-CM | POA: Insufficient documentation

## 2019-07-04 DIAGNOSIS — N182 Chronic kidney disease, stage 2 (mild): Secondary | ICD-10-CM | POA: Insufficient documentation

## 2019-07-04 DIAGNOSIS — I252 Old myocardial infarction: Secondary | ICD-10-CM | POA: Insufficient documentation

## 2019-07-04 DIAGNOSIS — K649 Unspecified hemorrhoids: Secondary | ICD-10-CM | POA: Insufficient documentation

## 2019-07-04 DIAGNOSIS — I2510 Atherosclerotic heart disease of native coronary artery without angina pectoris: Secondary | ICD-10-CM | POA: Insufficient documentation

## 2019-07-04 LAB — URINALYSIS, ROUTINE W REFLEX MICROSCOPIC
Bilirubin Urine: NEGATIVE
Glucose, UA: NEGATIVE mg/dL
Ketones, ur: NEGATIVE mg/dL
Leukocytes,Ua: NEGATIVE
Nitrite: NEGATIVE
Protein, ur: 100 mg/dL — AB
Specific Gravity, Urine: 1.02 (ref 1.005–1.030)
pH: 6 (ref 5.0–8.0)

## 2019-07-04 LAB — URINALYSIS, MICROSCOPIC (REFLEX)
Bacteria, UA: NONE SEEN
Squamous Epithelial / HPF: NONE SEEN (ref 0–5)

## 2019-07-04 LAB — OCCULT BLOOD X 1 CARD TO LAB, STOOL: Fecal Occult Bld: NEGATIVE

## 2019-07-04 IMAGING — DX DG ABDOMEN ACUTE W/ 1V CHEST
7 series · 7 of 7 positions shown · non-contrast
Comparison: Chest x-ray comparison [DATE]

CLINICAL DATA: Constipation.

EXAM:
DG ABDOMEN ACUTE W/ 1V CHEST

[chest pa (1 of 3)]
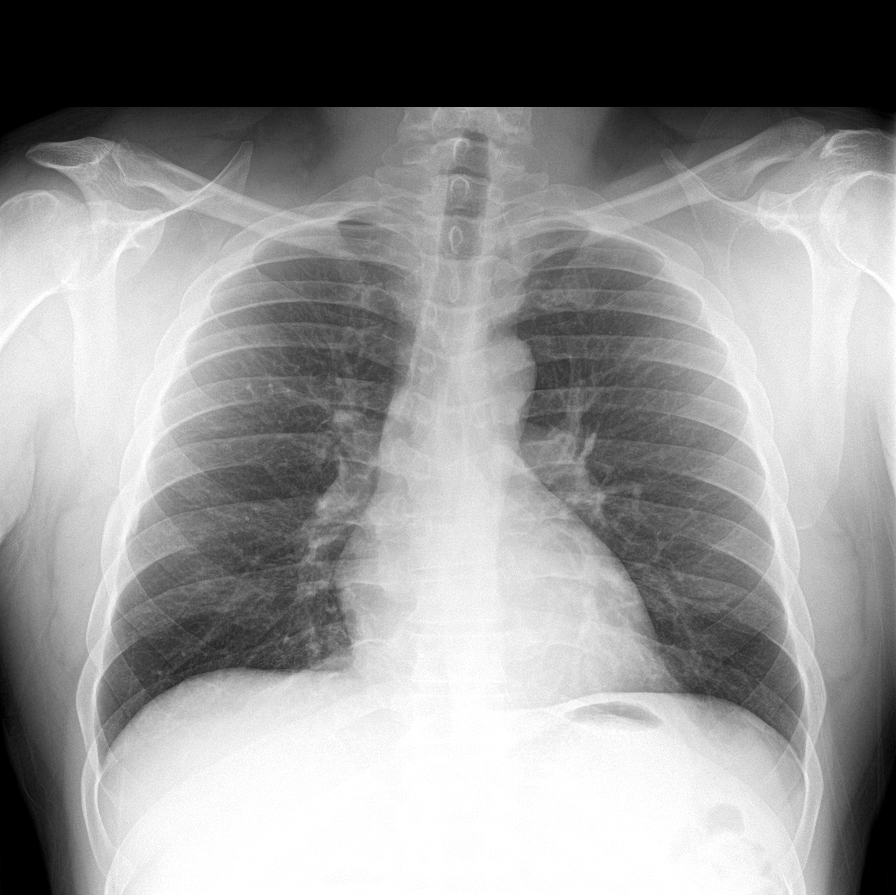

[chest pa (2 of 3)]
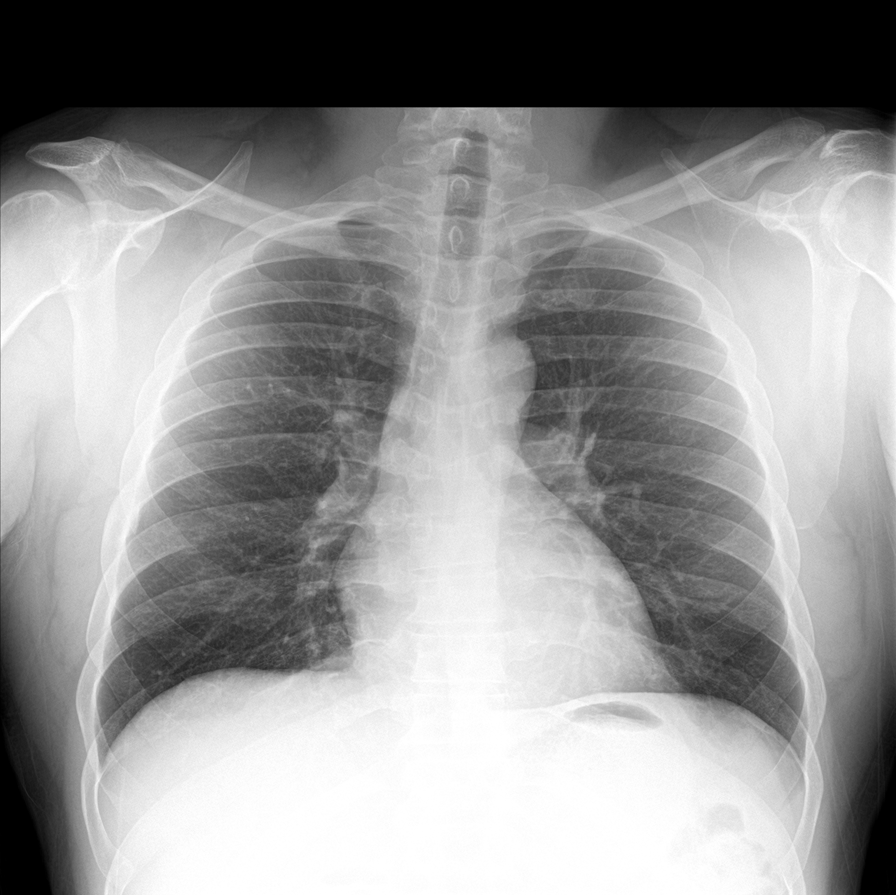

[chest pa (3 of 3)]
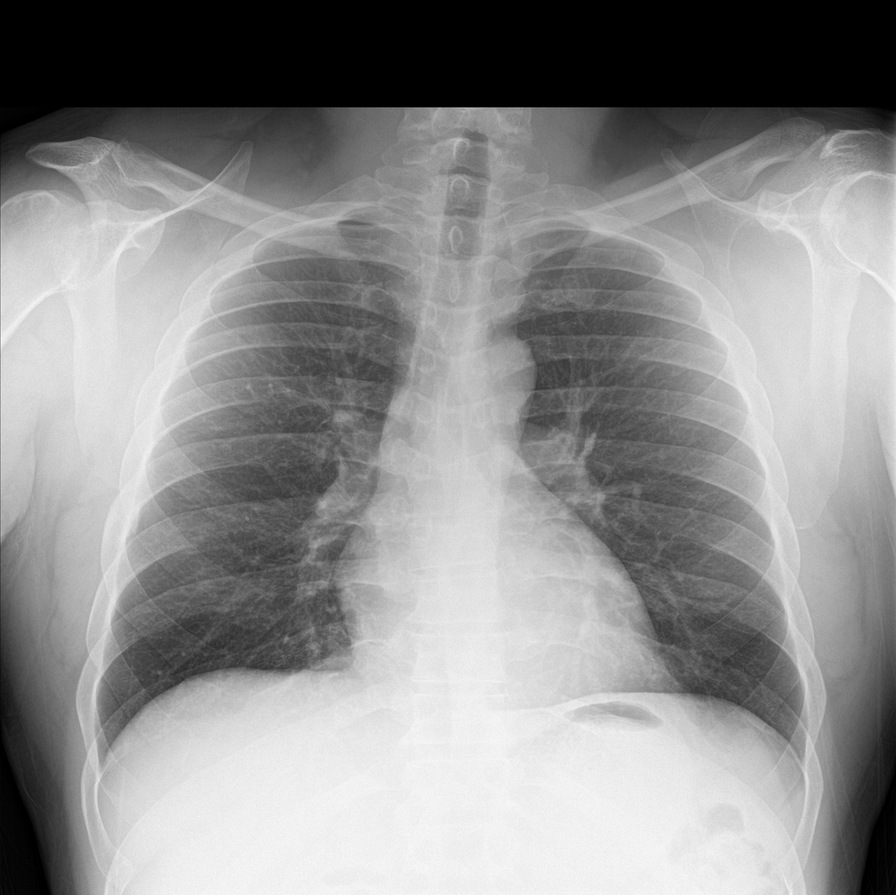

[abdomen erect (1 of 2)]
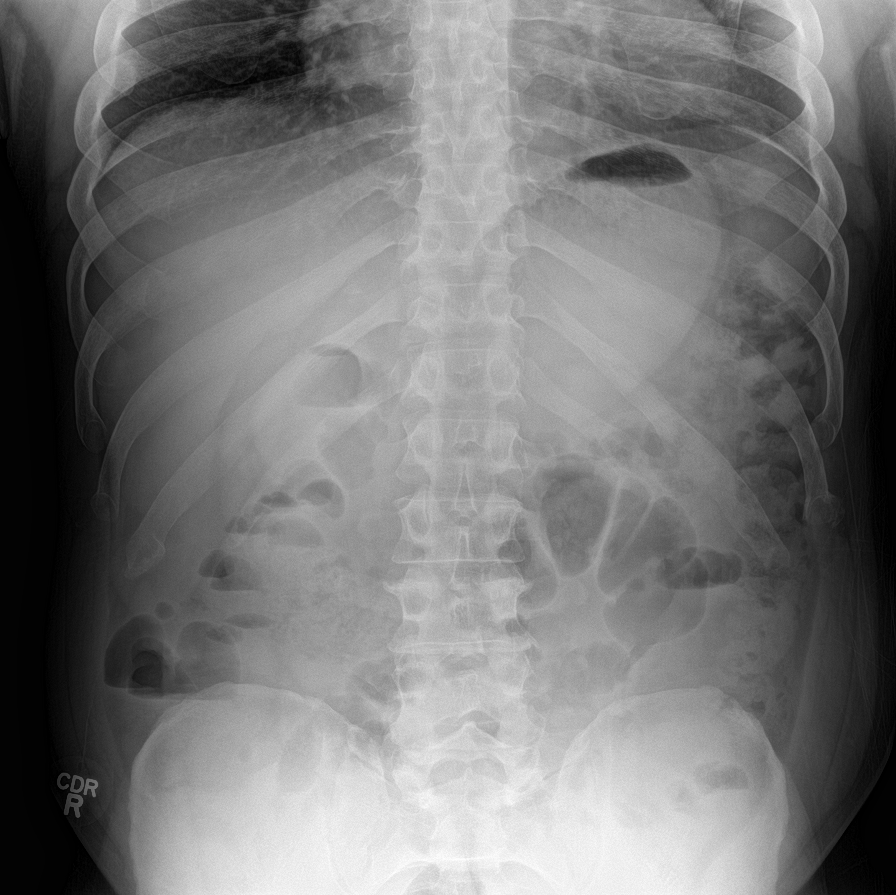

[abdomen erect (2 of 2)]
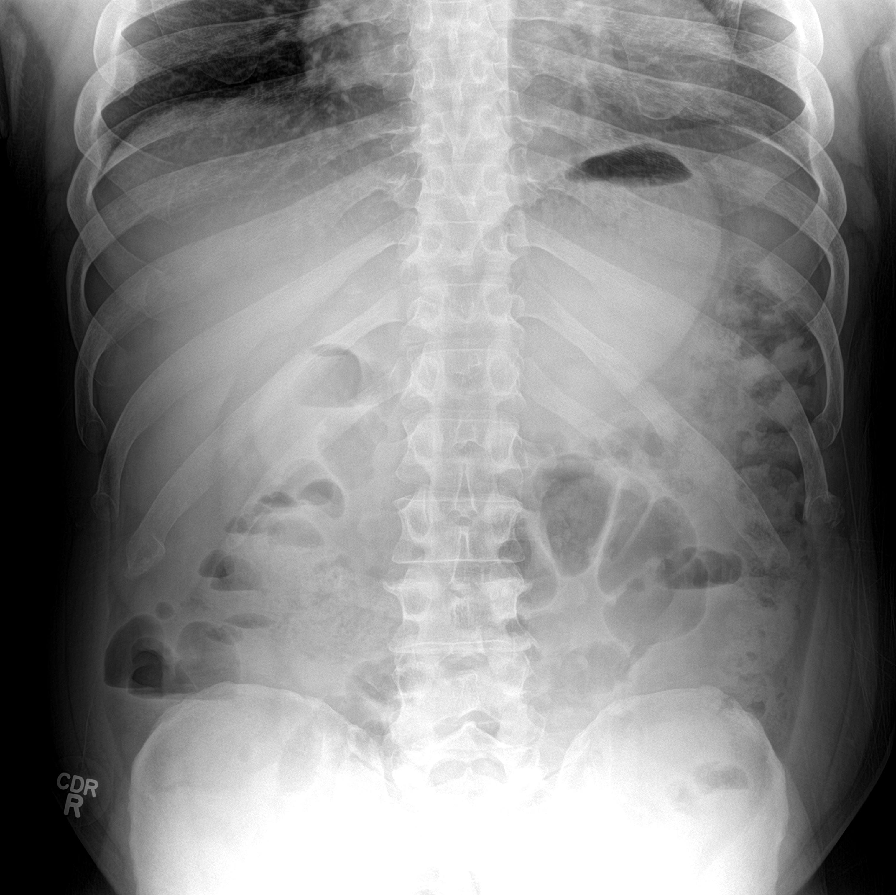

[abdomen supine (1 of 2)]
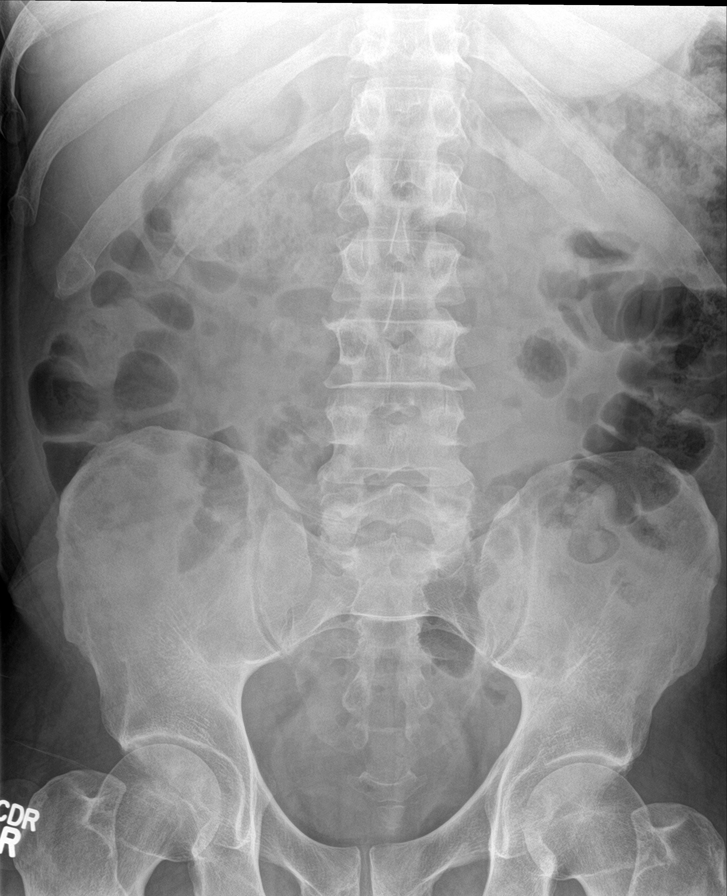

[abdomen supine (2 of 2)]
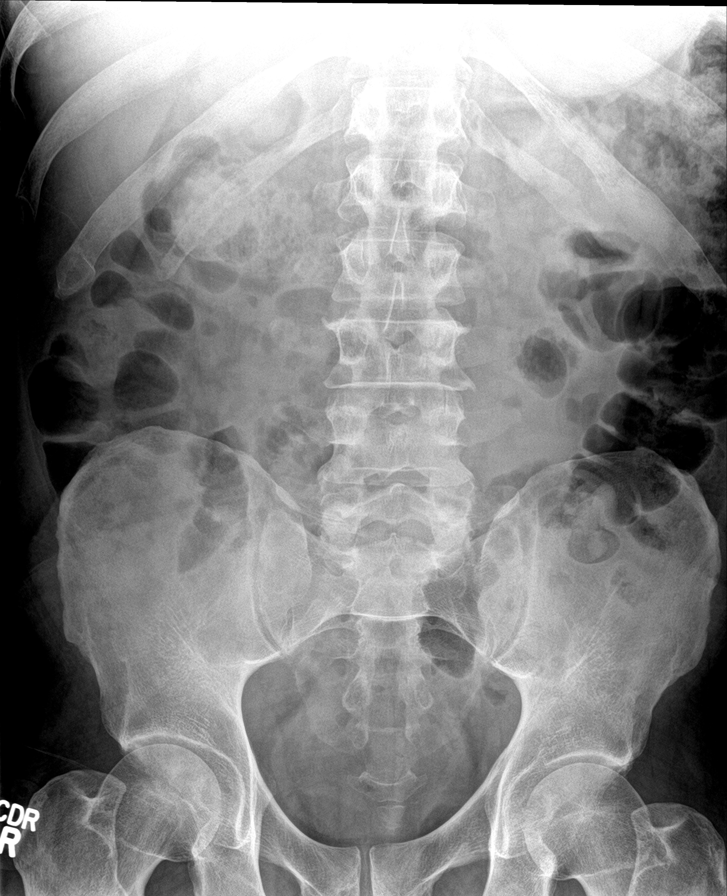

[7 of 7 positions shown; findings below may reference images not displayed]

FINDINGS: The lungs are clear without focal consolidation, edema, effusion or
pneumothorax. Cardio pericardial silhouette is within normal limits
for size. Imaged bony structures of the thorax are intact.

Upright film shows no evidence for intraperitoneal free air. There
is no evidence for gaseous bowel dilation to suggest obstruction.
Small to moderate stool volume noted. Visualized bony anatomy
unremarkable.
IMPRESSION: Impression

1. Normal chest x-ray
2. No evidence for perforation or obstruction.
3. Small to moderate colonic stool volume.

## 2019-07-04 MED ORDER — ACETAMINOPHEN 500 MG PO TABS
1000.0000 mg | ORAL_TABLET | Freq: Once | ORAL | Status: AC
  Administered 2019-07-04: 02:00:00 1000 mg via ORAL
  Filled 2019-07-04: qty 2

## 2019-07-04 MED ORDER — IBUPROFEN 800 MG PO TABS
800.0000 mg | ORAL_TABLET | Freq: Once | ORAL | Status: AC
  Administered 2019-07-04: 04:00:00 800 mg via ORAL
  Filled 2019-07-04: qty 1

## 2019-07-04 MED ORDER — POLYETHYLENE GLYCOL 3350 17 G PO PACK: 17.0000 g | PACK | Freq: Every day | ORAL | 0 refills | Status: DC

## 2019-07-04 NOTE — Discharge Instructions (Addendum)
Use preparation H on your bottom

## 2019-07-04 NOTE — ED Notes (Signed)
Patient transported to X-ray 

## 2019-07-04 NOTE — ED Notes (Signed)
ED Provider at bedside. 

## 2019-07-04 NOTE — ED Triage Notes (Signed)
Pt is c/o rectal pain that started Thursday a week ago   Denies any rectal bleeding  Pt states the pain is more on the inside  Pt states he was constipated last week and took a laxative on Tuesday

## 2019-07-04 NOTE — ED Provider Notes (Addendum)
[facility] EMERGENCY DEPARTMENT Provider Note   CSN: [id number] Arrival date & time: 07/04/19  0113     History   Chief Complaint Chief Complaint  Patient presents with  . Rectal Pain    HPI Matthew Fisher is a 48 y.o. male.     The history is provided by the patient.  Illness Location:  Rectum Quality:  Painful Severity:  Moderate Onset quality:  Gradual Duration:  10 days Timing:  Constant Progression:  Unchanged Chronicity:  New Context:  Constipation Relieved by:  Nothing Worsened by:  Nothing Ineffective treatments:  None tried Associated symptoms: no abdominal pain, no chest pain, no congestion, no cough, no diarrhea, no ear pain, no fatigue, no fever, no headaches, no loss of consciousness, no myalgias, no nausea, no rash, no rhinorrhea, no shortness of breath, no sore throat, no vomiting and no wheezing   Risk factors:  Constipation Rectal pain and constipation for 10 days.  No abdominal pain.  No f/c/r.    Past Medical History:  Diagnosis Date  . Arthritis   . CAD (coronary artery disease)    a. NSTEMI 10/2017 s/p multivessel PCI of the OM1, PDA, PLA ostium and mid PLA.  . CKD (chronic kidney disease), stage II   . Gout   . Hypertension   . Insulin dependent diabetes mellitus [facility](HCC)     Patient Active Problem List   Diagnosis Date Noted  . CAD in native artery 10/18/2017  . CKD (chronic kidney disease), stage II 10/18/2017  . NSTEMI (non-ST elevated myocardial infarction) (HCC) 10/15/2017  . Type 2 diabetes mellitus with complication, with long-term current use of insulin (HCC) 10/15/2017  . Essential hypertension 10/15/2017  . Gastroparesis due to secondary diabetes (HCC) 10/15/2017  . Hypertensive emergency, no CHF   . Medial meniscus tear 01/22/2013    Past Surgical History:  Procedure Laterality Date  . CORONARY STENT INTERVENTION N/A 10/17/2017   Procedure: CORONARY STENT INTERVENTION;  Surgeon: Corky CraftsVaranasi, Jayadeep S, MD;   [facility];  Service: Cardiovascular;  Laterality: N/A;  . FRACTURE SURGERY     right ring finger  . KNEE ARTHROSCOPY WITH LATERAL MENISECTOMY Left 01/22/2013   Procedure: LEFT KNEE ARTHROSCOPY DEBRIDEMENT CHONDROPLASTY, LATERAL RELEASE AND partial medial meniscectomy;  Surgeon: Javier DockerJeffrey C Beane, MD;  Location: [facility] ORS;  Service: Orthopedics;  Laterality: Left;  . KNEE SURGERY     "removed bone"  . LEFT HEART CATH N/A 10/17/2017   Procedure: Left Heart Cath;  Surgeon: Corky CraftsVaranasi, Jayadeep S, MD;  [facility];  Service: Cardiovascular;  Laterality: N/A;  . LEFT HEART CATH AND CORONARY ANGIOGRAPHY N/A 10/15/2017   Procedure: LEFT HEART CATH AND CORONARY ANGIOGRAPHY;  Surgeon: Marykay LexHarding, David W, MD;  [facility];  Service: Cardiovascular;  Laterality: N/A;  . SHOULDER SURGERY          Home Medications    Prior to Admission medications   Medication Sig Start Date End Date Taking? Authorizing Provider  amLODipine (NORVASC) 10 MG tablet Take 1 tablet (10 mg total) by mouth daily. 10/28/17   Jodelle GrossLawrence, Kathryn M, NP  aspirin 81 MG chewable tablet Chew 1 tablet (81 mg total) by mouth daily. 10/20/17   Dunn, Raymon Muttonyan M, PA-C  atorvastatin (LIPITOR) 80 MG tablet Take 1 tablet (80 mg total) by mouth daily at 6 PM. 10/28/17   Jodelle GrossLawrence, Kathryn M, NP  CARAFATE 1 GM/10ML suspension Take 1 g by mouth 4 (four) times daily -  with meals and at bedtime.  10/10/17   [provider]  carvedilol (COREG) 25 MG tablet Take 1 tablet (25 mg total) by mouth 2 (two) times daily with a meal. 10/28/17   Lendon Colonel, NP  clopidogrel (PLAVIX) 75 MG tablet Take 1 tablet (75 mg total) by mouth daily. 10/28/17   Lendon Colonel, NP  colchicine 0.6 MG tablet Take 1 tablet (0.6 mg total) by mouth 2 (two) times daily. 03/27/19   Ward, Ozella Almond, PA-C  HYDROcodone-acetaminophen (NORCO/VICODIN) 5-325 MG tablet Take 1 tablet by mouth every 6 (six) hours as needed for severe  pain. 03/27/19   Ward, Ozella Almond, PA-C  insulin detemir (LEVEMIR) 100 UNIT/ML injection Inject 20 Units into the skin daily.    [provider]  metoCLOPramide (REGLAN) 10 MG tablet Take 1 tablet (10 mg total) by mouth every 6 (six) hours. 10/12/17   Doristine Devoid, PA-C  naproxen (NAPROSYN) 500 MG tablet Take 1 tablet (500 mg total) by mouth 2 (two) times daily. 09/23/18   Horton, Barbette Hair, MD  promethazine (PHENERGAN) 25 MG suppository Place 1 suppository (25 mg total) rectally every 6 (six) hours as needed for nausea or vomiting. 10/12/17   Ocie Cornfield T, PA-C  PROTONIX 40 MG tablet Take 40 mg by mouth 2 (two) times daily. 10/10/17   [provider]    Family History Family History  Problem Relation Age of Onset  . Diabetes Mother   . Cancer Father     Social History Social History   Tobacco Use  . Smoking status: Current Some Day Smoker    Packs/day: 0.50    Years: 20.00    Pack years: 10.00    Types: Cigarettes, Cigars    Last attempt to quit: 10/08/2002    Years since quitting: 16.7  . Smokeless tobacco: Never Used  Substance Use Topics  . Alcohol use: Yes    Comment: occasional  . Drug use: No     Allergies   Patient has no known allergies.   Review of Systems Review of Systems  Constitutional: Negative for fatigue and fever.  HENT: Negative for congestion, ear pain, rhinorrhea and sore throat.   Eyes: Negative for visual disturbance.  Respiratory: Negative for cough, shortness of breath and wheezing.   Cardiovascular: Negative for chest pain.  Gastrointestinal: Negative for abdominal pain, diarrhea, nausea and vomiting.  Genitourinary: Negative for difficulty urinating.  Musculoskeletal: Negative for myalgias.  Skin: Negative for rash.  Neurological: Negative for loss of consciousness and headaches.  Psychiatric/Behavioral: Negative for agitation.  All other systems reviewed and are negative.    Physical Exam Updated Vital  Signs BP (!) 186/102 (BP Location: Right Arm)   Pulse 86   Temp 98.2 F (36.8 C)   Resp 20   Ht 5\' 8"  (1.727 m)   Wt 94.8 kg   SpO2 100%   BMI 31.78 kg/m   Physical Exam Vitals signs and nursing note reviewed.  Constitutional:      General: He is not in acute distress.    Appearance: He is normal weight.  HENT:     Head: Normocephalic and atraumatic.     Nose: Nose normal.  Eyes:     Conjunctiva/sclera: Conjunctivae normal.     Pupils: Pupils are equal, round, and reactive to light.  Neck:     Musculoskeletal: Normal range of motion and neck supple.  Cardiovascular:     Rate and Rhythm: Normal rate and regular rhythm.  Pulses: Normal pulses.     Heart sounds: Normal heart sounds.  Pulmonary:     Effort: Pulmonary effort is normal.     Breath sounds: Normal breath sounds.  Abdominal:     General: Abdomen is flat.     Tenderness: There is no abdominal tenderness. There is no guarding.  Genitourinary:    Prostate: Normal.     Rectum: Guaiac result negative.     Comments: Anal fissure and hemorrhoid Musculoskeletal: Normal range of motion.  Skin:    General: Skin is warm and dry.     Capillary Refill: Capillary refill takes less than 2 seconds.  Neurological:     General: No focal deficit present.     Mental Status: He is alert and oriented to person, place, and time.  Psychiatric:        Mood and Affect: Mood normal.      ED Treatments / Results  Labs (all labs ordered are listed, but only abnormal results are displayed) Results for orders placed or performed during the hospital encounter of 07/04/19  Urinalysis, Routine w reflex microscopic  Result Value Ref Range   Color, Urine YELLOW YELLOW   APPearance CLEAR CLEAR   Specific Gravity, Urine 1.020 1.005 - 1.030   pH 6.0 5.0 - 8.0   Glucose, UA NEGATIVE NEGATIVE mg/dL   Hgb urine dipstick TRACE (A) NEGATIVE   Bilirubin Urine NEGATIVE NEGATIVE   Ketones, ur NEGATIVE NEGATIVE mg/dL   Protein, ur 161  (A) NEGATIVE mg/dL   Nitrite NEGATIVE NEGATIVE   Leukocytes,Ua NEGATIVE NEGATIVE  Occult blood card to lab, stool  Result Value Ref Range   Fecal Occult Bld NEGATIVE NEGATIVE  Urinalysis, Microscopic (reflex)  Result Value Ref Range   RBC / HPF 0-5 0 - 5 RBC/hpf   WBC, UA 0-5 0 - 5 WBC/hpf   Bacteria, UA NONE SEEN NONE SEEN   Squamous Epithelial / LPF NONE SEEN 0 - 5   No results found.  Radiology No results found.  Procedures Procedures (including critical care time)  Medications Ordered in ED Medications  ibuprofen (ADVIL) tablet 800 mg (has no administration in time range)  acetaminophen (TYLENOL) tablet 1,000 mg (1,000 mg Oral Given 07/04/19 0147)     Pain is likely secondary to fissure and hemorrhoid secondary to straining to have a BM due to constipation.  Recommend miralax, preparation H   Matthew Fisher was evaluated in Emergency Department on 07/04/2019 for the symptoms described in the history of present illness. He was evaluated in the context of the global COVID-19 pandemic, which necessitated consideration that the patient might be at risk for infection with the SARS-CoV-2 virus that causes COVID-19. Institutional protocols and algorithms that pertain to the evaluation of patients at risk for COVID-19 are in a state of rapid change based on information released by regulatory bodies including the CDC and federal and state organizations. These policies and algorithms were followed during the patient's care in the ED.   Final diagnoses:  Anal fissure  Hemorrhoids, unspecified hemorrhoid type    Return for intractable cough, coughing up blood,fevers >100.4 unrelieved by medication, shortness of breath, intractable vomiting, chest pain, shortness of breath, weakness,numbness, changes in speech, facial asymmetry,abdominal pain, passing out,Inability to tolerate liquids or food, cough, altered mental status or any concerns. No signs of systemic illness or infection.  The patient is nontoxic-appearing on exam and vital signs are within normal limits.   I have reviewed the triage vital signs and the nursing  notes. Pertinent labs &imaging results that were available during my care of the patient were reviewed by me and considered in my medical decision making (see chart for details).After history, exam, and medical workup I feel the patient has beenappropriately medically screened and is safe for discharge home. Pertinent diagnoses were discussed with the patient. Patient was given return precautions.   Matthew Budai, MD 07/04/19 [id number]    Cy Blamer, MD 07/04/19 8016

## 2019-09-10 ENCOUNTER — Other Ambulatory Visit: Payer: Self-pay

## 2019-09-10 ENCOUNTER — Emergency Department (HOSPITAL_BASED_OUTPATIENT_CLINIC_OR_DEPARTMENT_OTHER): Payer: Self-pay

## 2019-09-10 ENCOUNTER — Inpatient Hospital Stay (HOSPITAL_BASED_OUTPATIENT_CLINIC_OR_DEPARTMENT_OTHER)
Admission: EM | Admit: 2019-09-10 | Discharge: 2019-09-12 | DRG: 682 | Disposition: A | Discharge status: Home / Self Care | Payer: Self-pay | Attending: Internal Medicine | Admitting: Internal Medicine

## 2019-09-10 ENCOUNTER — Encounter (HOSPITAL_BASED_OUTPATIENT_CLINIC_OR_DEPARTMENT_OTHER): Payer: Self-pay | Admitting: Emergency Medicine

## 2019-09-10 DIAGNOSIS — I129 Hypertensive chronic kidney disease with stage 1 through stage 4 chronic kidney disease, or unspecified chronic kidney disease: Principal | ICD-10-CM | POA: Diagnosis present

## 2019-09-10 DIAGNOSIS — H547 Unspecified visual loss: Secondary | ICD-10-CM | POA: Diagnosis present

## 2019-09-10 DIAGNOSIS — Z720 Tobacco use: Secondary | ICD-10-CM | POA: Diagnosis present

## 2019-09-10 DIAGNOSIS — Z9119 Patient's noncompliance with other medical treatment and regimen: Secondary | ICD-10-CM

## 2019-09-10 DIAGNOSIS — Z20828 Contact with and (suspected) exposure to other viral communicable diseases: Secondary | ICD-10-CM | POA: Diagnosis present

## 2019-09-10 DIAGNOSIS — Z9114 Patient's other noncompliance with medication regimen: Secondary | ICD-10-CM

## 2019-09-10 DIAGNOSIS — E1165 Type 2 diabetes mellitus with hyperglycemia: Secondary | ICD-10-CM | POA: Diagnosis present

## 2019-09-10 DIAGNOSIS — I509 Heart failure, unspecified: Secondary | ICD-10-CM | POA: Diagnosis present

## 2019-09-10 DIAGNOSIS — I252 Old myocardial infarction: Secondary | ICD-10-CM

## 2019-09-10 DIAGNOSIS — Z7902 Long term (current) use of antithrombotics/antiplatelets: Secondary | ICD-10-CM

## 2019-09-10 DIAGNOSIS — I272 Pulmonary hypertension, unspecified: Secondary | ICD-10-CM | POA: Diagnosis present

## 2019-09-10 DIAGNOSIS — R0989 Other specified symptoms and signs involving the circulatory and respiratory systems: Secondary | ICD-10-CM

## 2019-09-10 DIAGNOSIS — Z955 Presence of coronary angioplasty implant and graft: Secondary | ICD-10-CM

## 2019-09-10 DIAGNOSIS — Z91199 Patient's noncompliance with other medical treatment and regimen due to unspecified reason: Secondary | ICD-10-CM

## 2019-09-10 DIAGNOSIS — Z791 Long term (current) use of non-steroidal anti-inflammatories (NSAID): Secondary | ICD-10-CM

## 2019-09-10 DIAGNOSIS — R0602 Shortness of breath: Secondary | ICD-10-CM

## 2019-09-10 DIAGNOSIS — I10 Essential (primary) hypertension: Secondary | ICD-10-CM | POA: Diagnosis present

## 2019-09-10 DIAGNOSIS — N182 Chronic kidney disease, stage 2 (mild): Secondary | ICD-10-CM | POA: Diagnosis present

## 2019-09-10 DIAGNOSIS — M109 Gout, unspecified: Secondary | ICD-10-CM | POA: Diagnosis present

## 2019-09-10 DIAGNOSIS — Z833 Family history of diabetes mellitus: Secondary | ICD-10-CM

## 2019-09-10 DIAGNOSIS — E669 Obesity, unspecified: Secondary | ICD-10-CM | POA: Diagnosis present

## 2019-09-10 DIAGNOSIS — F17210 Nicotine dependence, cigarettes, uncomplicated: Secondary | ICD-10-CM | POA: Diagnosis present

## 2019-09-10 DIAGNOSIS — Z6831 Body mass index (BMI) 31.0-31.9, adult: Secondary | ICD-10-CM

## 2019-09-10 DIAGNOSIS — Z79899 Other long term (current) drug therapy: Secondary | ICD-10-CM

## 2019-09-10 DIAGNOSIS — J9601 Acute respiratory failure with hypoxia: Secondary | ICD-10-CM | POA: Diagnosis present

## 2019-09-10 DIAGNOSIS — Z7982 Long term (current) use of aspirin: Secondary | ICD-10-CM

## 2019-09-10 DIAGNOSIS — I2510 Atherosclerotic heart disease of native coronary artery without angina pectoris: Secondary | ICD-10-CM | POA: Diagnosis present

## 2019-09-10 DIAGNOSIS — Z794 Long term (current) use of insulin: Secondary | ICD-10-CM

## 2019-09-10 DIAGNOSIS — M1990 Unspecified osteoarthritis, unspecified site: Secondary | ICD-10-CM | POA: Diagnosis present

## 2019-09-10 DIAGNOSIS — I160 Hypertensive urgency: Secondary | ICD-10-CM | POA: Diagnosis present

## 2019-09-10 DIAGNOSIS — E8770 Fluid overload, unspecified: Secondary | ICD-10-CM | POA: Diagnosis present

## 2019-09-10 DIAGNOSIS — J811 Chronic pulmonary edema: Secondary | ICD-10-CM | POA: Diagnosis present

## 2019-09-10 DIAGNOSIS — M8580 Other specified disorders of bone density and structure, unspecified site: Secondary | ICD-10-CM | POA: Diagnosis present

## 2019-09-10 LAB — GLUCOSE, CAPILLARY
Glucose-Capillary: 239 mg/dL — ABNORMAL HIGH (ref 70–99)
Glucose-Capillary: 251 mg/dL — ABNORMAL HIGH (ref 70–99)

## 2019-09-10 LAB — CBC
HCT: 32.2 % — ABNORMAL LOW (ref 39.0–52.0)
HCT: 32.5 % — ABNORMAL LOW (ref 39.0–52.0)
Hemoglobin: 11.4 g/dL — ABNORMAL LOW (ref 13.0–17.0)
Hemoglobin: 11.9 g/dL — ABNORMAL LOW (ref 13.0–17.0)
MCH: 31.7 pg (ref 26.0–34.0)
MCH: 32 pg (ref 26.0–34.0)
MCHC: 35.4 g/dL (ref 30.0–36.0)
MCHC: 36.6 g/dL — ABNORMAL HIGH (ref 30.0–36.0)
MCV: 89.4 fL (ref 80.0–100.0)
MCV: 87.4 fL (ref 80.0–100.0)
Platelets: 222 10*3/uL (ref 150–400)
Platelets: 231 10*3/uL (ref 150–400)
RBC: 3.6 MIL/uL — ABNORMAL LOW (ref 4.22–5.81)
RBC: 3.72 MIL/uL — ABNORMAL LOW (ref 4.22–5.81)
RDW: 12.1 % (ref 11.5–15.5)
RDW: 12 % (ref 11.5–15.5)
WBC: 7.2 10*3/uL (ref 4.0–10.5)
WBC: 7.7 10*3/uL (ref 4.0–10.5)
nRBC: 0 % (ref 0.0–0.2)
nRBC: 0 % (ref 0.0–0.2)

## 2019-09-10 LAB — CREATININE, SERUM
Creatinine, Ser: 1.86 mg/dL — ABNORMAL HIGH (ref 0.61–1.24)
GFR calc Af Amer: 48 mL/min — ABNORMAL LOW (ref >60)
GFR calc non Af Amer: 41 mL/min — ABNORMAL LOW (ref >60)

## 2019-09-10 LAB — HEMOGLOBIN A1C
Hgb A1c MFr Bld: 8.3 % — ABNORMAL HIGH (ref 4.8–5.6)
Mean Plasma Glucose: 191.51 mg/dL

## 2019-09-10 LAB — BRAIN NATRIURETIC PEPTIDE: B Natriuretic Peptide: 357 pg/mL — ABNORMAL HIGH (ref 0.0–100.0)

## 2019-09-10 LAB — COMPREHENSIVE METABOLIC PANEL
ALT: 13 U/L (ref 0–44)
AST: 18 U/L (ref 15–41)
Albumin: 3.5 g/dL (ref 3.5–5.0)
Alkaline Phosphatase: 91 U/L (ref 38–126)
Anion gap: 6 (ref 5–15)
BUN: 17 mg/dL (ref 6–20)
CO2: 23 mmol/L (ref 22–32)
Calcium: 8.9 mg/dL (ref 8.9–10.3)
Chloride: 107 mmol/L (ref 98–111)
Creatinine, Ser: 1.54 mg/dL — ABNORMAL HIGH (ref 0.61–1.24)
GFR calc Af Amer: >60 mL/min (ref >60)
GFR calc non Af Amer: 52 mL/min — ABNORMAL LOW (ref >60)
Glucose, Bld: 207 mg/dL — ABNORMAL HIGH (ref 70–99)
Potassium: 4.2 mmol/L (ref 3.5–5.1)
Sodium: 136 mmol/L (ref 135–145)
Total Bilirubin: 0.5 mg/dL (ref 0.3–1.2)
Total Protein: 7.1 g/dL (ref 6.5–8.1)

## 2019-09-10 LAB — SARS CORONAVIRUS 2 AG (30 MIN TAT): SARS Coronavirus 2 Ag: NEGATIVE

## 2019-09-10 LAB — SARS CORONAVIRUS 2 (TAT 6-24 HRS): SARS Coronavirus 2: NEGATIVE

## 2019-09-10 LAB — TROPONIN I (HIGH SENSITIVITY)
Troponin I (High Sensitivity): 27 ng/L — ABNORMAL HIGH (ref <18)
Troponin I (High Sensitivity): 27 ng/L — ABNORMAL HIGH (ref <18)
Troponin I (High Sensitivity): 35 ng/L — ABNORMAL HIGH (ref <18)

## 2019-09-10 LAB — HIV ANTIBODY (ROUTINE TESTING W REFLEX): HIV Screen 4th Generation wRfx: NONREACTIVE

## 2019-09-10 IMAGING — DX DG CHEST 2V
2 series · 2 of 2 positions shown · non-contrast
Comparison: [DATE]

CLINICAL DATA: Shortness of breath and chest tightness

EXAM:
CHEST - 2 VIEW

[chest pa]
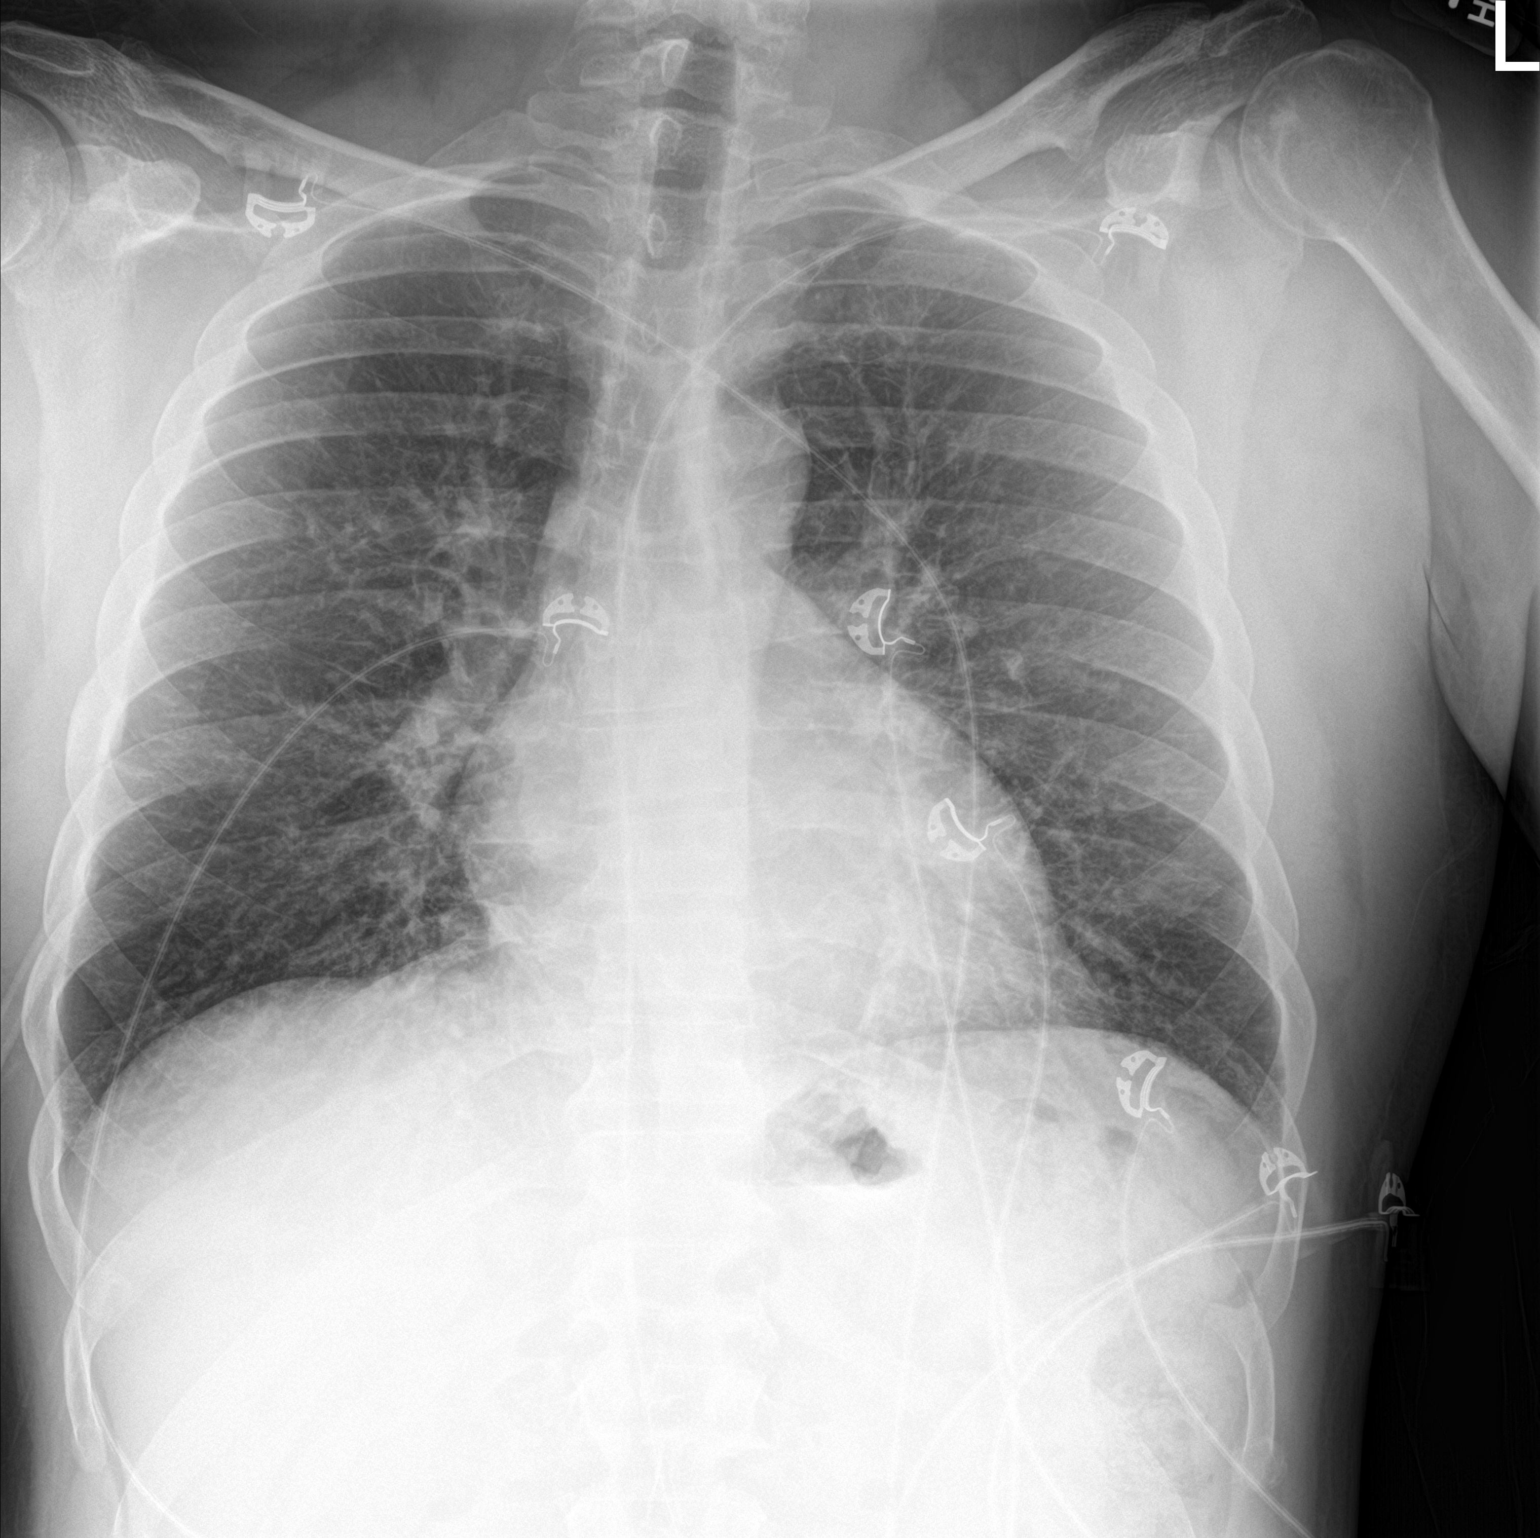

[chest lat]
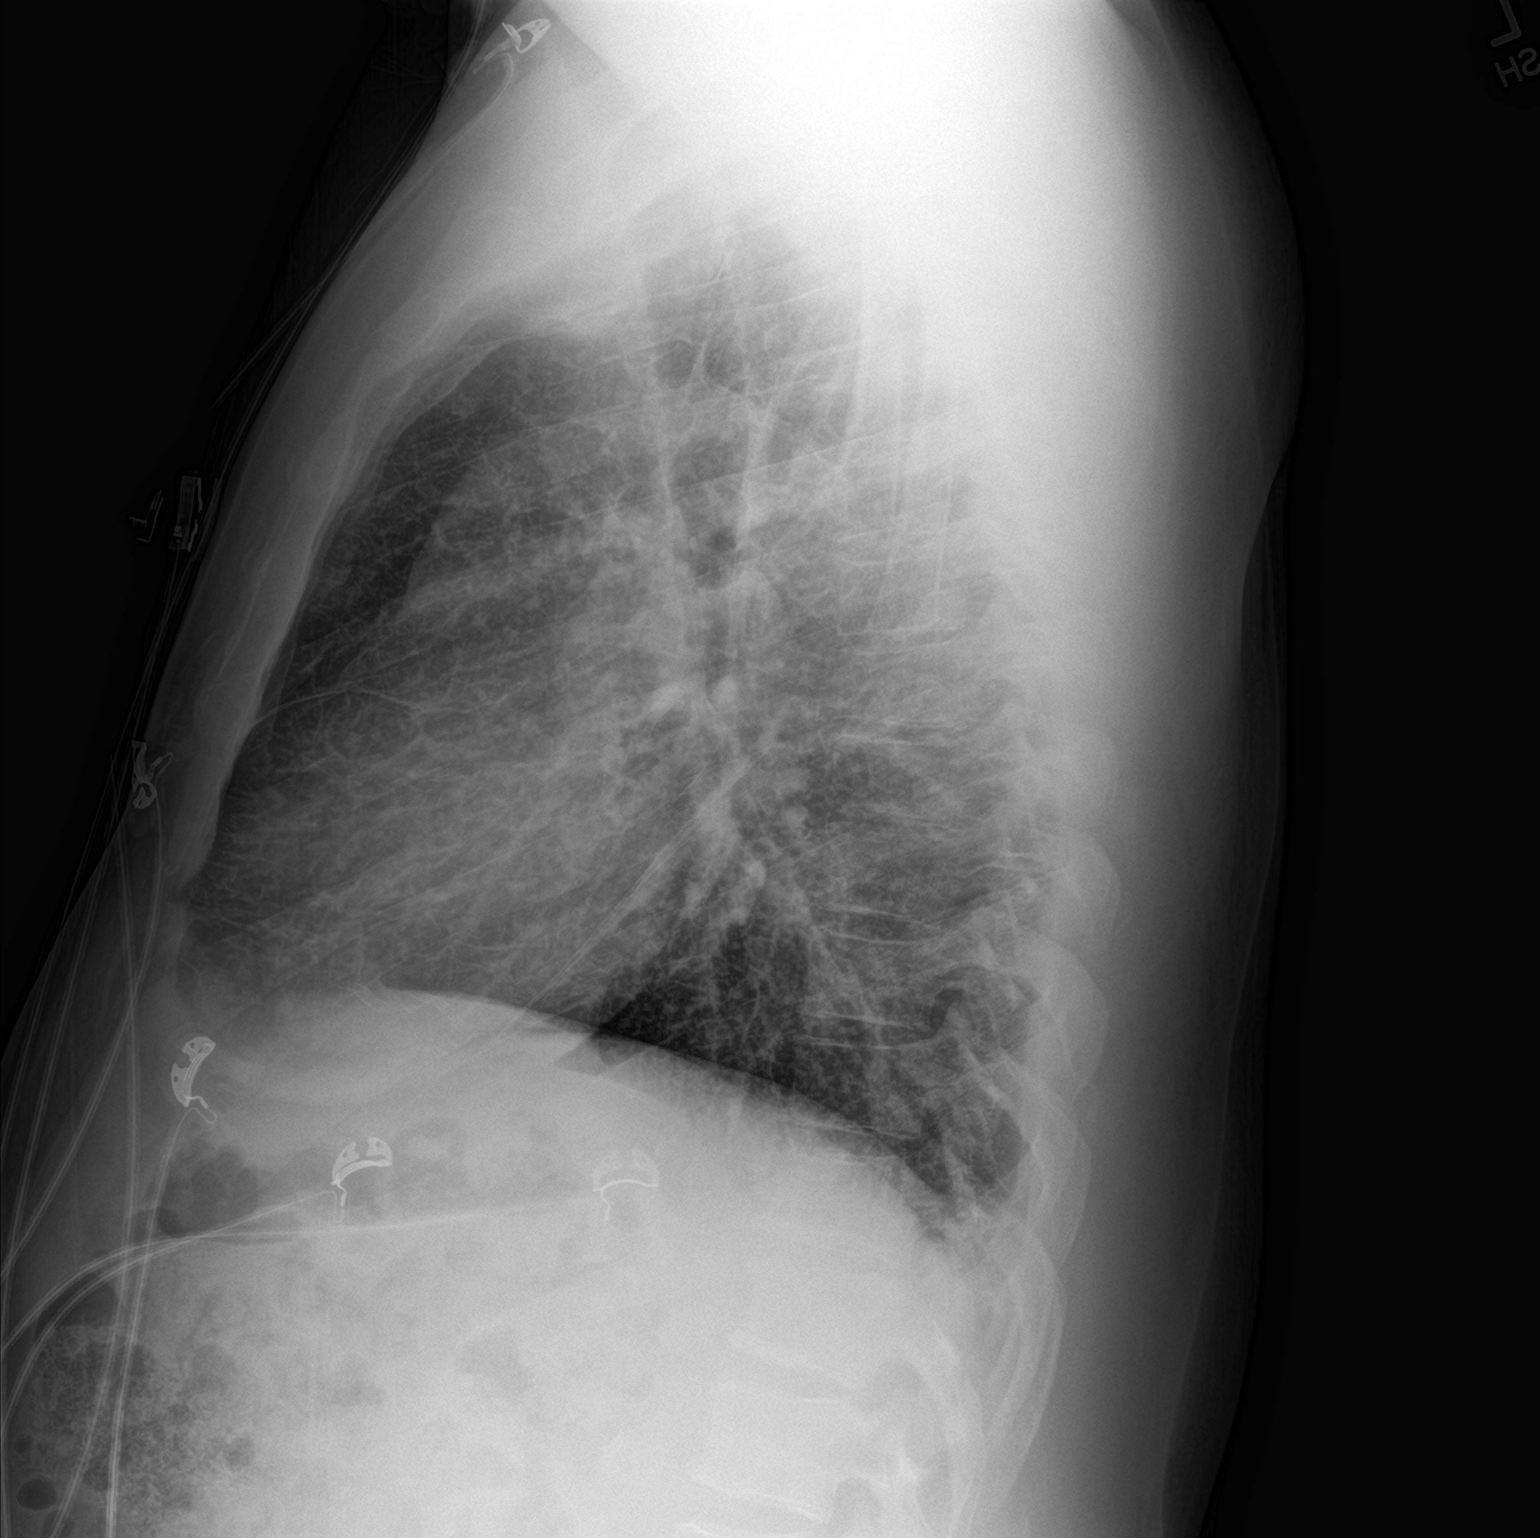

[2 of 2 positions shown; findings below may reference images not displayed]

FINDINGS: There is interstitial thickening in the mid and lower lung zones
bilaterally. There is no airspace consolidation or pleural effusion.
No volume loss.

Heart is upper normal in size. There is mild pulmonary venous
hypertension. No adenopathy. No bone lesions.
IMPRESSION: There is mild pulmonary venous hypertension. Interstitial prominence
in the mid lower lung zones could indicate slight interstitial
edema. Question a degree of volume overload. No consolidation or
well-defined airspace opacity. No pleural effusions. Heart upper
normal in size.

## 2019-09-10 MED ORDER — ACETAMINOPHEN 325 MG PO TABS: 650.0000 mg | ORAL_TABLET | ORAL | Status: DC | PRN

## 2019-09-10 MED ORDER — CARVEDILOL 25 MG PO TABS
25.0000 mg | ORAL_TABLET | Freq: Two times a day (BID) | ORAL | Status: DC
  Administered 2019-09-11 – 2019-09-12 (×3): 25 mg via ORAL
  Filled 2019-09-10 (×3): qty 1

## 2019-09-10 MED ORDER — FUROSEMIDE 10 MG/ML IJ SOLN
20.0000 mg | Freq: Once | INTRAMUSCULAR | Status: AC
  Administered 2019-09-10: 20 mg via INTRAVENOUS
  Filled 2019-09-10: qty 2

## 2019-09-10 MED ORDER — ENOXAPARIN SODIUM 40 MG/0.4ML ~~LOC~~ SOLN: 40.0000 mg | SUBCUTANEOUS | Status: DC

## 2019-09-10 MED ORDER — ASPIRIN 81 MG PO CHEW
81.0000 mg | CHEWABLE_TABLET | Freq: Every day | ORAL | Status: DC
  Administered 2019-09-10 – 2019-09-12 (×3): 81 mg via ORAL
  Filled 2019-09-10 (×3): qty 1

## 2019-09-10 MED ORDER — ACETAMINOPHEN 650 MG RE SUPP: 650.0000 mg | Freq: Four times a day (QID) | RECTAL | Status: DC | PRN

## 2019-09-10 MED ORDER — ONDANSETRON HCL 4 MG/2ML IJ SOLN: 4.0000 mg | Freq: Four times a day (QID) | INTRAMUSCULAR | Status: DC | PRN

## 2019-09-10 MED ORDER — CALCIUM CARBONATE ANTACID 500 MG PO CHEW
1.0000 | CHEWABLE_TABLET | Freq: Three times a day (TID) | ORAL | Status: DC | PRN
  Administered 2019-09-10: 200 mg via ORAL
  Filled 2019-09-10: qty 1

## 2019-09-10 MED ORDER — CLOPIDOGREL BISULFATE 75 MG PO TABS
75.0000 mg | ORAL_TABLET | Freq: Every day | ORAL | Status: DC
  Administered 2019-09-10 – 2019-09-12 (×3): 75 mg via ORAL
  Filled 2019-09-10 (×3): qty 1

## 2019-09-10 MED ORDER — ONDANSETRON HCL 4 MG PO TABS: 4.0000 mg | ORAL_TABLET | Freq: Four times a day (QID) | ORAL | Status: DC | PRN

## 2019-09-10 MED ORDER — ATORVASTATIN CALCIUM 80 MG PO TABS
80.0000 mg | ORAL_TABLET | Freq: Every day | ORAL | Status: DC
  Administered 2019-09-10 – 2019-09-11 (×2): 80 mg via ORAL
  Filled 2019-09-10 (×2): qty 1

## 2019-09-10 MED ORDER — AMLODIPINE BESYLATE 5 MG PO TABS
10.0000 mg | ORAL_TABLET | Freq: Once | ORAL | Status: AC
  Administered 2019-09-10: 10 mg via ORAL

## 2019-09-10 MED ORDER — AMLODIPINE BESYLATE 10 MG PO TABS
10.0000 mg | ORAL_TABLET | Freq: Every day | ORAL | Status: DC
  Administered 2019-09-11 – 2019-09-12 (×2): 10 mg via ORAL
  Filled 2019-09-10 (×2): qty 1

## 2019-09-10 MED ORDER — INSULIN ASPART 100 UNIT/ML ~~LOC~~ SOLN: 0.0000 [IU] | Freq: Every day | SUBCUTANEOUS | Status: DC

## 2019-09-10 MED ORDER — SODIUM CHLORIDE 0.9% FLUSH
3.0000 mL | Freq: Two times a day (BID) | INTRAVENOUS | Status: DC
  Administered 2019-09-10 – 2019-09-12 (×4): 3 mL via INTRAVENOUS

## 2019-09-10 MED ORDER — AMLODIPINE BESYLATE 5 MG PO TABS
ORAL_TABLET | ORAL | Status: AC
  Filled 2019-09-10: qty 2

## 2019-09-10 MED ORDER — LINAGLIPTIN 5 MG PO TABS: 5.0000 mg | ORAL_TABLET | Freq: Every day | ORAL | Status: DC

## 2019-09-10 MED ORDER — INSULIN ASPART 100 UNIT/ML ~~LOC~~ SOLN
0.0000 [IU] | Freq: Three times a day (TID) | SUBCUTANEOUS | Status: DC
  Administered 2019-09-11 (×2): 7 [IU] via SUBCUTANEOUS
  Administered 2019-09-11: 5 [IU] via SUBCUTANEOUS
  Administered 2019-09-12 (×2): 2 [IU] via SUBCUTANEOUS

## 2019-09-10 MED ORDER — ALLOPURINOL 100 MG PO TABS
100.0000 mg | ORAL_TABLET | Freq: Once | ORAL | Status: AC
  Administered 2019-09-10: 100 mg via ORAL
  Filled 2019-09-10: qty 1

## 2019-09-10 MED ORDER — CARVEDILOL 25 MG PO TABS
25.0000 mg | ORAL_TABLET | Freq: Once | ORAL | Status: AC
  Filled 2019-09-10: qty 1

## 2019-09-10 MED ORDER — PREDNISONE 20 MG PO TABS: 40.0000 mg | ORAL_TABLET | Freq: Every day | ORAL | Status: DC

## 2019-09-10 MED ORDER — FUROSEMIDE 10 MG/ML IJ SOLN
40.0000 mg | Freq: Two times a day (BID) | INTRAMUSCULAR | Status: DC
  Administered 2019-09-10 – 2019-09-11 (×2): 40 mg via INTRAVENOUS
  Filled 2019-09-10 (×2): qty 4

## 2019-09-10 MED ORDER — PREDNISONE 20 MG PO TABS
40.0000 mg | ORAL_TABLET | Freq: Every day | ORAL | Status: AC
  Administered 2019-09-10 – 2019-09-12 (×2): 40 mg via ORAL
  Filled 2019-09-10 (×2): qty 2

## 2019-09-10 MED ORDER — ACETAMINOPHEN 325 MG PO TABS: 650.0000 mg | ORAL_TABLET | Freq: Four times a day (QID) | ORAL | Status: DC | PRN

## 2019-09-10 MED ORDER — ENOXAPARIN SODIUM 40 MG/0.4ML ~~LOC~~ SOLN
40.0000 mg | SUBCUTANEOUS | Status: DC
  Administered 2019-09-10 – 2019-09-11 (×2): 40 mg via SUBCUTANEOUS
  Filled 2019-09-10 (×2): qty 0.4

## 2019-09-10 MED ORDER — INSULIN ISOPHANE & REGULAR (HUMAN 70-30)100 UNIT/ML KWIKPEN: 10.0000 [IU] | PEN_INJECTOR | Freq: Every day | SUBCUTANEOUS | Status: DC

## 2019-09-10 MED ORDER — INSULIN DETEMIR 100 UNIT/ML ~~LOC~~ SOLN
20.0000 [IU] | Freq: Every day | SUBCUTANEOUS | Status: DC
  Administered 2019-09-10 – 2019-09-11 (×2): 20 [IU] via SUBCUTANEOUS
  Filled 2019-09-10 (×2): qty 0.2

## 2019-09-10 NOTE — H&P (Signed)
History and Physical    Matthew Fisher VQQ:[id number] DOB: [birth year] DOA: 09/10/2019  **Will admit patient based on the expectation that the patient will need hospitalization/ hospital care that crosses at least 2 midnights  PCP: System, Pcp Not In [facility]  Attending physician: Maylene Roes  Patient coming from/Resides with: Wife  Chief Complaint: Shortness of breath  HPI: Matthew Fisher is a 48 y.o. male with medical history significant for CAD with non-STEMI January 2019.  Patient was found to have multivessel coronary artery disease and underwent staged PCI of the OM1, PDA, osteopenia and PLA and was placed on DAPT for 1 year with plans to remain on Plavix lifelong.  Has a history of hypertension, type 2 diabetes with hyperglycemia on insulin, and chronic kidney disease stage II.  Patient reported that he was in his usual state of health until yesterday evening when he went out to take care of a horse that had gotten out of the fence.  When he bent over he noticed he was short of breath.  After resting the sensation improved.  He went home and laid on the couch and slept but was awakened because he could not breathe.  This was associated with coughing and nausea.  Symptoms persisted and he presented to the ER today.  Patient he was afebrile but his blood pressure was markedly elevated at 180/111, he was satting 99% on room air.  X-ray revealed mild pulmonary hypertension and slight interstitial edema with no pleural effusions.  BNP was 357.  High-sensitivity troponin elevated slightly at 27 and 27 with notable flat trend.  Patient was given Lasix 20 mg IV in the ER with subsequent urine output of 720 cc.  Patient reports that he inconsistently takes his cardiac as well as his diabetic medications.  Does not consistently check his sugars at home and is inconsistent in dietary restrictions regarding diabetes and salt.  He continues to smoke several cigarettes per day.  Denies  alcohol intake.  Patient states his prior ischemic symptoms are GI discomfort, nausea with vomiting and a sensation of something or someone sitting on his chest.  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  He follows with the community clinic in [facility].  ED Course:  Vital Signs: BP (!) 160/97 (BP Location: Left Arm)   Pulse 84   Temp 98.7 F (37.1 C) (Oral)   Resp 20   Ht '5\' 8"'  (1.727 m)   Wt 93.2 kg   SpO2 100%   BMI 31.23 kg/m   Review of Systems:  In addition to the HPI above,  No Fever-chills, myalgias or other constitutional symptoms No Headache, changes with Vision or hearing, new weakness, tingling, numbness in any extremity, dizziness, dysarthria or word finding difficulty, gait disturbance or imbalance, tremors or seizure activity No problems swallowing food or Liquids, indigestion/reflux, choking or coughing while eating, abdominal pain with or after eating No Chest pain, + acute coughing episode after onset of initial shortness of breath-cough was nonproductive, no palpitations,+orthopnea and DOE No Abdominal pain, N/V, melena,hematochezia, dark tarry stools, constipation No dysuria, malodorous urine, hematuria or flank pain No new skin rashes, lesions, masses or bruises, No new joint pains, aches, swelling or redness No recent unintentional weight gain or loss No polyuria, polydypsia or polyphagia   Past Medical History:  Diagnosis Date  . Arthritis   . CAD (coronary artery disease)    a. NSTEMI 10/2017 s/p multivessel  PCI of the OM1, PDA, PLA ostium and mid PLA.  . CKD (chronic kidney disease), stage II   . Gout   . Hypertension   . Insulin dependent diabetes mellitus     Past Surgical History:  Procedure Laterality Date  . CORONARY STENT INTERVENTION N/A 10/17/2017   Procedure: CORONARY STENT INTERVENTION;  Surgeon: Jettie Booze, MD;  Location: [facility];  Service:  Cardiovascular;  Laterality: N/A;  . FRACTURE SURGERY     right ring finger  . KNEE ARTHROSCOPY WITH LATERAL MENISECTOMY Left 01/22/2013   Procedure: LEFT KNEE ARTHROSCOPY DEBRIDEMENT CHONDROPLASTY, LATERAL RELEASE AND partial medial meniscectomy;  Surgeon: Johnn Hai, MD;  Location: [facility];  Service: Orthopedics;  Laterality: Left;  . KNEE SURGERY     "removed bone"  . LEFT HEART CATH N/A 10/17/2017   Procedure: Left Heart Cath;  Surgeon: Jettie Booze, MD;  Location: [place] [facility];  Service: Cardiovascular;  Laterality: N/A;  . LEFT HEART CATH AND CORONARY ANGIOGRAPHY N/A 10/15/2017   Procedure: LEFT HEART CATH AND CORONARY ANGIOGRAPHY;  Surgeon: Leonie Man, MD;  Location: [place];  Service: Cardiovascular;  Laterality: N/A;  . SHOULDER SURGERY      Social History   Socioeconomic History  . Marital status: Divorced    Spouse name: Not on file  . Number of children: Not on file  . Years of education: Not on file  . Highest education level: Not on file  Occupational History  . Not on file  Social Needs  . Financial resource strain: Not on file  . Food insecurity    Worry: Not on file    Inability: Not on file  . Transportation needs    Medical: Not on file    Non-medical: Not on file  Tobacco Use  . Smoking status: Current Some Day Smoker    Packs/day: 0.50    Years: 20.00    Pack years: 10.00    Types: Cigarettes, Cigars    Last attempt to quit: 10/08/2002    Years since quitting: 16.9  . Smokeless tobacco: Never Used  Substance and Sexual Activity  . Alcohol use: Yes    Comment: occasional  . Drug use: No  . Sexual activity: Yes  Lifestyle  . Physical activity    Days per week: Not on file    Minutes per session: Not on file  . Stress: Not on file  Relationships  . Social Herbalist on phone: Not on file    Gets together: Not on file    Attends religious service: Not on file    Active member of club or organization: Not  on file    Attends meetings of clubs or organizations: Not on file    Relationship status: Not on file  . Intimate partner violence    Fear of current or ex partner: Not on file    Emotionally abused: Not on file    Physically abused: Not on file    Forced sexual activity: Not on file  Other Topics Concern  . Not on file  Social History Narrative  . Not on file    Mobility: Independent Work history: Disabled   No Known Allergies  Family History  Problem Relation Age of Onset  . Diabetes Mother   . Cancer Father    Family history reviewed and not pertinent to current admission clinical findings or diagnosis  Prior to Admission medications   Medication  Sig Start Date End Date Taking? Authorizing Provider  nitroGLYCERIN (NITROSTAT) 0.4 MG SL tablet Place under the tongue. 10/23/17 03/15/20 Yes [provider]  Vitamin D, Ergocalciferol, (DRISDOL) 1.25 MG (50000 UT) CAPS capsule Take by mouth. 11/27/18  Yes [provider]  amLODipine (NORVASC) 10 MG tablet Take 1 tablet (10 mg total) by mouth daily. 10/28/17   Lendon Colonel, NP  aspirin 81 MG chewable tablet Chew 1 tablet (81 mg total) by mouth daily. 10/20/17   Dunn, Areta Haber, PA-C  atorvastatin (LIPITOR) 80 MG tablet Take 1 tablet (80 mg total) by mouth daily at 6 PM. 10/28/17   Lendon Colonel, NP  CARAFATE 1 GM/10ML suspension Take 1 g by mouth 4 (four) times daily -  with meals and at bedtime.  10/10/17   [provider]  carvedilol (COREG) 25 MG tablet Take 1 tablet (25 mg total) by mouth 2 (two) times daily with a meal. 10/28/17   Lendon Colonel, NP  clopidogrel (PLAVIX) 75 MG tablet Take 1 tablet (75 mg total) by mouth daily. 10/28/17   Lendon Colonel, NP  colchicine 0.6 MG tablet Take 1 tablet (0.6 mg total) by mouth 2 (two) times daily. 03/27/19   Ward, Ozella Almond, PA-C  ferrous sulfate 325 (65 FE) MG tablet Take 325 mg by mouth every morning. 08/06/19   [provider]   HUMULIN 70/30 KWIKPEN (70-30) 100 UNIT/ML PEN INJECT 10 UNITS UNDER THE SKIN DAILY (EACH PEN IS GOOD FOR 10 DAYS ONLY) 08/06/19   [provider]  HYDROcodone-acetaminophen (NORCO/VICODIN) 5-325 MG tablet Take 1 tablet by mouth every 6 (six) hours as needed for severe pain. 03/27/19   Ward, Ozella Almond, PA-C  insulin detemir (LEVEMIR) 100 UNIT/ML injection Inject 20 Units into the skin daily.    [provider]  JANUVIA 50 MG tablet Take 50 mg by mouth daily. 08/06/19   [provider]  metoCLOPramide (REGLAN) 10 MG tablet Take 1 tablet (10 mg total) by mouth every 6 (six) hours. 10/12/17   Doristine Devoid, PA-C  naproxen (NAPROSYN) 500 MG tablet Take 1 tablet (500 mg total) by mouth 2 (two) times daily. 09/23/18   Horton, Barbette Hair, MD  polyethylene glycol (MIRALAX) 17 g packet Take 17 g by mouth daily. 07/04/19   Palumbo, April, MD  promethazine (PHENERGAN) 25 MG suppository Place 1 suppository (25 mg total) rectally every 6 (six) hours as needed for nausea or vomiting. 10/12/17   Ocie Cornfield T, PA-C  PROTONIX 40 MG tablet Take 40 mg by mouth 2 (two) times daily. 10/10/17   [provider]    Physical Exam: Vitals:   09/10/19 1430 09/10/19 1445 09/10/19 1530 09/10/19 1701  BP: (!) 185/109  (!) 195/111 (!) 160/97  Pulse: 82 77 86 84  Resp: '10 11 19 20  ' Temp:    98.7 F (37.1 C)  TempSrc:    Oral  SpO2: 98% 97% 100% 100%  Weight:    93.2 kg  Height:    '5\' 8"'  (1.727 m)      Constitutional: NAD, calm, comfortable Eyes: PERRL, lids and conjunctivae normal ENMT: Mucous membranes are moist. Posterior pharynx clear of any exudate or lesions.Normal dentition.  Neck: normal, supple, no masses, no thyromegaly Respiratory: Faint mid field and basilar crackles noted on posterior exam, normal respiratory effort. No accessory muscle use.  Room air Cardiovascular: Regular rate and rhythm, no murmurs / rubs / gallops. No extremity edema. 2+ pedal pulses. No  carotid bruits.  Abdomen: no tenderness, no masses palpated. No hepatosplenomegaly. Bowel sounds positive.  Musculoskeletal: no clubbing / cyanosis. No joint deformity upper and lower extremities. Good ROM, no contractures. Normal muscle tone.  Skin: no rashes, lesions, ulcers. No induration Neurologic: CN 2-12 grossly intact. Sensation intact, DTR normal. Strength 5/5 x all 4 extremities.  Psychiatric: Normal judgment and insight. Alert and oriented x 3. Normal mood.    Labs on Admission: I have personally reviewed following labs and imaging studies  CBC: Recent Labs  Lab 09/10/19 1014  WBC 7.2  HGB 11.4*  HCT 32.2*  MCV 89.4  PLT 449   Basic Metabolic Panel: Recent Labs  Lab 09/10/19 1014  NA 136  K 4.2  CL 107  CO2 23  GLUCOSE 207*  BUN 17  CREATININE 1.54*  CALCIUM 8.9   GFR: Estimated Creatinine Clearance: 63.6 mL/min (A) (by C-G formula based on SCr of 1.54 mg/dL (H)). Liver Function Tests: Recent Labs  Lab 09/10/19 1014  AST 18  ALT 13  ALKPHOS 91  BILITOT 0.5  PROT 7.1  ALBUMIN 3.5   No results for input(s): LIPASE, AMYLASE in the last 168 hours. No results for input(s): AMMONIA in the last 168 hours. Coagulation Profile: No results for input(s): INR, PROTIME in the last 168 hours. Cardiac Enzymes: No results for input(s): CKTOTAL, CKMB, CKMBINDEX, TROPONINI in the last 168 hours. BNP (last 3 results) No results for input(s): PROBNP in the last 8760 hours. HbA1C: No results for input(s): HGBA1C in the last 72 hours. CBG: Recent Labs  Lab 09/10/19 1651  GLUCAP 239*   Lipid Profile: No results for input(s): CHOL, HDL, LDLCALC, TRIG, CHOLHDL, LDLDIRECT in the last 72 hours. Thyroid Function Tests: No results for input(s): TSH, T4TOTAL, FREET4, T3FREE, THYROIDAB in the last 72 hours. Anemia Panel: No results for input(s): VITAMINB12, FOLATE, FERRITIN, TIBC, IRON, RETICCTPCT in the last 72 hours. Urine analysis:    Component Value Date/Time    COLORURINE YELLOW 07/04/2019 0226   APPEARANCEUR CLEAR 07/04/2019 0226   LABSPEC 1.020 07/04/2019 0226   PHURINE 6.0 07/04/2019 0226   GLUCOSEU NEGATIVE 07/04/2019 0226   HGBUR TRACE (A) 07/04/2019 0226   BILIRUBINUR NEGATIVE 07/04/2019 0226   KETONESUR NEGATIVE 07/04/2019 0226   PROTEINUR 100 (A) 07/04/2019 0226   UROBILINOGEN 1.0 08/21/2012 0651   NITRITE NEGATIVE 07/04/2019 0226   LEUKOCYTESUR NEGATIVE 07/04/2019 0226   Sepsis Labs: '@LABRCNTIP' (procalcitonin:4,lacticidven:4) ) Recent Results (from the past 240 hour(s))  SARS Coronavirus 2 Ag (30 min TAT) - Nasal Swab (BD Veritor Kit)     Status: None   Collection Time: 09/10/19 10:14 AM   Specimen: Nasal Swab (BD Veritor Kit)  Result Value Ref Range Status   SARS Coronavirus 2 Ag NEGATIVE NEGATIVE Final    Comment: (NOTE) SARS-CoV-2 antigen NOT DETECTED.  Negative results are presumptive.  Negative results do not preclude SARS-CoV-2 infection and should not be used as the sole basis for treatment or other patient management decisions, including infection  control decisions, particularly in the presence of clinical signs and  symptoms consistent with COVID-19, or in those who have been in contact with the virus.  Negative results must be combined with clinical observations, patient history, and epidemiological information. The expected result is Negative. Fact Sheet for Patients: PodPark.tn Fact Sheet for Healthcare Providers: GiftContent.is This test is not yet approved or cleared by the [place] FDA and  has been authorized for detection and/or diagnosis of SARS-CoV-2 by FDA under an Emergency  Use Authorization (EUA).  This EUA will remain in effect (meaning this test can be used) for the duration of  the COVID-19 de claration under Section 564(b)(1) of the Act, 21 U.S.C. section 360bbb-3(b)(1), unless the authorization is terminated or revoked  sooner. Performed at [facility], [address] [place], [place] [address]      Radiological Exams on Admission: Dg Chest 2 View  Result Date: 09/10/2019 CLINICAL DATA:  Shortness of breath and chest tightness EXAM: CHEST - 2 VIEW COMPARISON:  October 15, 2017 FINDINGS: There is interstitial thickening in the mid and lower lung zones bilaterally. There is no airspace consolidation or pleural effusion. No volume loss. Heart is upper normal in size. There is mild pulmonary venous hypertension. No adenopathy. No bone lesions. IMPRESSION: There is mild pulmonary venous hypertension. Interstitial prominence in the mid lower lung zones could indicate slight interstitial edema. Question a degree of volume overload. No consolidation or well-defined airspace opacity. No pleural effusions. Heart upper normal in size. Electronically Signed   By: Lowella Grip III M.D.   On: 09/10/2019 09:54    EKG: (Independently reviewed) sinus rhythm with ventricular rate 84 bpm, QTC 451 ms, T wave inversions in leads I, II, aVF, V5 and V6, voltage criteria met for LVH.  Septal leads with elevated J-point and prominent T wave-voltage criteria met for LVH, normal R wave rotation  Assessment/Plan Principal Problem:   Suspected congestive heart failure -Presents with acute onset of shortness of breath with abnormal chest x-ray and increased urinary output and improvement in symptoms after administration of IV Lasix -Suspect etiology secondary to dietary medication nonadherence-patient with positive voltage criteria for LVH on EKG and echocardiogram from 2019 also revealed mild LVH -Lasix 40 mg IV every 12 hours -Daily weights with strict I's/O -Obtain echocardiogram this admission -Resume preadmission carvedilol-was not on ACE inhibitor or ARB prior to admission  Active Problems:   CAD (coronary artery disease) -Current presenting symptoms not consistent with prior ischemic symptoms from 2019 -Patient  does have multivessel CAD and underwent PCI to 4 vessels previously but not candidate for CABG -Continue Plavix -Flat trend in high-sensitivity troponin and likely related to suspected heart failure-repeat again x1 -If regional wall motion abnormalities will need to consult cardiology for input -Continue Lipitor and beta-blocker    Insulin dependent diabetes mellitus with hyperglycemia -Continue 70/30 insulin -Obtain hemoglobin A1c -Low CBG and provide SSI -Discussed with patient that appropriate management of diabetes and to grow to controlling vascular disease including known heart disease -Patient inconsistently checks CBGs at home and also misses doses of insulin    Hypertension -Uncontrolled at presentation likely related to volume overload from suspected heart failure -Resume Norvasc and carvedilol -Pending results of echocardiogram may need to DC Norvasc in favor of ACE inhibitor or ARB or hydralazine     Nonadherence to medical treatment -Patient continues to smoke, is inconsistent with utilization of insulin and checking CBGs and forgets to take antihypertensive medications as well -Risks of noncompliance with medical therapies as well as diet discussed at length with patient including worsening of underlying medical problems that can lead to blindness, worsening of cardiovascular disease, kidney disease requiring dialysis or even death.  Patient verbalized understanding    Tobacco abuse disorder -Patient reports smokes less than half a pack of cigarettes per day and stated he no longer wishes to smoke -Continue to support patient/continue cessation counseling    **Additional lab, imaging and/or diagnostic evaluation at discretion of supervising  physician  DVT prophylaxis: Lovenox Code Status: Full Family Communication: Patient only Disposition Plan: Observation Consults called: None    Erin Hearing ANP-BC [facility] Pager [phone]   If 7PM-7AM, please  contact night-coverage [email or web address] Password [facility]  09/10/2019, 6:34 PM

## 2019-09-10 NOTE — Plan of Care (Signed)
Transfer from [facility] accepted to a cardiac telemetry bed as observation. Matthew Fisher is a 48 year old male with pmh HTN, gout, CKD stage III, and CAD s/p PCI, presenting with complaints of shortness of breath since last night.  Labs significant for hemoglobin 11.4, BUN 17, creatinine 1.54, glucose 207, BNP 357, HS troponin 27.  Chest x-ray concerning for pulmonary venous hypertension with interstitial prominence concerning for possible edema. COVID-19 negative.  20 mg of Lasix IV.  TR[facility] called to admit.

## 2019-09-10 NOTE — ED Notes (Signed)
PT ambulated with steady gait and no assistance. PTS O2 stayed above 98% throughout ambulation in the room.

## 2019-09-10 NOTE — ED Notes (Signed)
Pt on monitor 

## 2019-09-10 NOTE — ED Provider Notes (Signed)
[facility] EMERGENCY DEPARTMENT Provider Note   CSN: [id number] Arrival date & time: 09/10/19  0907     History   Chief Complaint Chief Complaint  Patient presents with  . Shortness of Breath    HPI Matthew Fisher is a 48 y.o. male with a past medical history significant for arthritis, CAD, NSTEMI status post stent placement in 2019, hypertension, diabetes, and gout who presents to the ED for an evaluation of sudden onset of shortness of breath that occurred early this morning around 1am. Patient notes he woke up from his sleep gasping for air which is associated with a cough.  Patient notes "it felt like something went down my windpipe". Excessive cough made patient feel nauseous, but denies any episodes of emesis. Patient notes shortness of breath is worse when lying down especially on his side and associated with bilateral lower extremity edema. Patient also notes intermittent, non-radiating, central chest pain since last night. Patient notes previous history of chest pain and had an NSTEMI with stent placement in 2019. He describes the pain as chest pressure that occurs at rest and with exertion. Patient denies COVID exposures and sick contacts. He denies shortness of breath with exertion.  Patient also admits to a right knee gout flare that started on Saturday.  He notes it feels like his typical gout and he has been taking ibuprofen with moderate relief.  Patient smokes half a pack a day.  Patient denies fever and chills.  Patient denies history of blood clots, recent surgeries, recent immobilizations, and hemoptysis. Patient is currently on Plavix and ASA 81mg  daily.  Past Medical History:  Diagnosis Date  . Arthritis   . CAD (coronary artery disease)    a. NSTEMI 10/2017 s/p multivessel PCI of the OM1, PDA, PLA ostium and mid PLA.  . CKD (chronic kidney disease), stage II   . Gout   . Hypertension   . Insulin dependent diabetes mellitus     Patient Active Problem List    Diagnosis Date Noted  . Suspected congestive heart failure 09/10/2019  . Nonadherence to medical treatment   . Tobacco abuse disorder   . CAD (coronary artery disease) 10/18/2017  . CKD (chronic kidney disease), stage II 10/18/2017  . NSTEMI (non-ST elevated myocardial infarction) (HCC) 10/15/2017  . Insulin dependent diabetes mellitus 10/15/2017  . Hypertension 10/15/2017  . Gastroparesis due to secondary diabetes (HCC) 10/15/2017  . Hypertensive emergency, no CHF   . Medial meniscus tear 01/22/2013    Past Surgical History:  Procedure Laterality Date  . CORONARY STENT INTERVENTION N/A 10/17/2017   Procedure: CORONARY STENT INTERVENTION;  Surgeon: Corky CraftsVaranasi, Jayadeep S, MD;  [facility];  Service: Cardiovascular;  Laterality: N/A;  . FRACTURE SURGERY     right ring finger  . KNEE ARTHROSCOPY WITH LATERAL MENISECTOMY Left 01/22/2013   Procedure: LEFT KNEE ARTHROSCOPY DEBRIDEMENT CHONDROPLASTY, LATERAL RELEASE AND partial medial meniscectomy;  Surgeon: Javier DockerJeffrey C Beane, MD;  Location: [facility] ORS;  Service: Orthopedics;  Laterality: Left;  . KNEE SURGERY     "removed bone"  . LEFT HEART CATH N/A 10/17/2017   Procedure: Left Heart Cath;  Surgeon: Corky CraftsVaranasi, Jayadeep S, MD;  [facility];  Service: Cardiovascular;  Laterality: N/A;  . LEFT HEART CATH AND CORONARY ANGIOGRAPHY N/A 10/15/2017   Procedure: LEFT HEART CATH AND CORONARY ANGIOGRAPHY;  Surgeon: Marykay LexHarding, David W, MD;  [facility];  Service: Cardiovascular;  Laterality: N/A;  . SHOULDER SURGERY  Home Medications    Prior to Admission medications   Medication Sig Start Date End Date Taking? Authorizing Provider  allopurinol (ZYLOPRIM) 100 MG tablet Take 100 mg by mouth daily.   Yes [provider]  amLODipine (NORVASC) 10 MG tablet Take 1 tablet (10 mg total) by mouth daily. 10/28/17  Yes Jodelle Gross, NP  aspirin 81 MG chewable tablet Chew 1 tablet (81 mg  total) by mouth daily. 10/20/17  Yes Dunn, Raymon Mutton, PA-C  atorvastatin (LIPITOR) 80 MG tablet Take 1 tablet (80 mg total) by mouth daily at 6 PM. 10/28/17  Yes Jodelle Gross, NP  carvedilol (COREG) 25 MG tablet Take 1 tablet (25 mg total) by mouth 2 (two) times daily with a meal. 10/28/17  Yes Jodelle Gross, NP  clopidogrel (PLAVIX) 75 MG tablet Take 1 tablet (75 mg total) by mouth daily. 10/28/17  Yes Jodelle Gross, NP  ferrous sulfate 325 (65 FE) MG tablet Take 325 mg by mouth every morning. 08/06/19  Yes [provider]  HYDROcodone-acetaminophen (NORCO/VICODIN) 5-325 MG tablet Take 1 tablet by mouth every 6 (six) hours as needed for severe pain. 03/27/19  Yes Ward, Chase Picket, PA-C  insulin detemir (LEVEMIR) 100 UNIT/ML injection Inject 20 Units into the skin daily.   Yes [provider]  nitroGLYCERIN (NITROSTAT) 0.4 MG SL tablet Place 0.4 mg under the tongue every 5 (five) minutes as needed for chest pain.  10/23/17 03/15/20 Yes [provider]  polyethylene glycol (MIRALAX) 17 g packet Take 17 g by mouth daily. Patient taking differently: Take 17 g by mouth daily as needed for mild constipation.  07/04/19  Yes Palumbo, April, MD  PROTONIX 40 MG tablet Take 40 mg by mouth 2 (two) times daily. 10/10/17  Yes [provider]  Vitamin D, Ergocalciferol, (DRISDOL) 1.25 MG (50000 UT) CAPS capsule Take by mouth every 7 (seven) days.  11/27/18  Yes [provider]  metoCLOPramide (REGLAN) 10 MG tablet Take 1 tablet (10 mg total) by mouth every 6 (six) hours. 10/12/17   Rise Mu, PA-C  naproxen (NAPROSYN) 500 MG tablet Take 1 tablet (500 mg total) by mouth 2 (two) times daily. 09/23/18   Horton, Mayer Masker, MD  promethazine (PHENERGAN) 25 MG suppository Place 1 suppository (25 mg total) rectally every 6 (six) hours as needed for nausea or vomiting. 10/12/17   Rise Mu, PA-C    Family History Family History  Problem Relation Age of  Onset  . Diabetes Mother   . Cancer Father     Social History Social History   Tobacco Use  . Smoking status: Current Some Day Smoker    Packs/day: 0.50    Years: 20.00    Pack years: 10.00    Types: Cigarettes, Cigars    Last attempt to quit: 10/08/2002    Years since quitting: 16.9  . Smokeless tobacco: Never Used  Substance Use Topics  . Alcohol use: Yes    Comment: occasional  . Drug use: No     Allergies   Patient has no known allergies.   Review of Systems Review of Systems  Constitutional: Negative for chills and fever.  HENT: Negative for congestion, rhinorrhea and sore throat.   Eyes: Negative for discharge.  Respiratory: Positive for cough and shortness of breath.   Cardiovascular: Positive for leg swelling. Negative for chest pain.  Gastrointestinal: Positive for abdominal pain and nausea. Negative for diarrhea and vomiting.  Musculoskeletal: Positive for arthralgias and  joint swelling.  All other systems reviewed and are negative.    Physical Exam Updated Vital Signs BP (!) 179/97 (BP Location: Right Arm)   Pulse 88   Temp 98 F (36.7 C) (Oral)   Resp (!) 22   Ht 5\' 8"  (1.727 m)   Wt 94.3 kg   SpO2 100%   BMI 31.63 kg/m   Physical Exam Vitals signs and nursing note reviewed.  Constitutional:      General: He is not in acute distress.    Appearance: He is not toxic-appearing.  HENT:     Head: Normocephalic.  Eyes:     Pupils: Pupils are equal, round, and reactive to light.  Neck:     Musculoskeletal: Neck supple.  Cardiovascular:     Rate and Rhythm: Normal rate and regular rhythm.     Pulses: Normal pulses.     Heart sounds: Normal heart sounds. No murmur. No friction rub. No gallop.   Pulmonary:     Effort: Pulmonary effort is normal.     Breath sounds: Normal breath sounds.     Comments: Respirations equal and unlabored, patient able to speak in full sentences, lungs clear to auscultation bilaterally  Abdominal:     General:  Abdomen is flat. Bowel sounds are normal. There is no distension.     Palpations: Abdomen is soft.     Tenderness: There is abdominal tenderness. There is guarding. There is no rebound.     Comments: Diffuse abdominal tenderness with voluntary guarding  Musculoskeletal: Normal range of motion.     Comments: Right knee with mild effusion and tenderness to palpation throughout, even with light touch. Able to slightly flex leg to 45 degrees and fully extend. No warmth or erythema. 1+ pitting edema bilaterally. Distal sensation and pulses intact.  Skin:    General: Skin is warm and dry.  Neurological:     General: No focal deficit present.     Mental Status: He is alert.      ED Treatments / Results  Labs (all labs ordered are listed, but only abnormal results are displayed) Labs Reviewed  CBC - Abnormal; Notable for the following components:      Result Value   RBC 3.60 (*)    Hemoglobin 11.4 (*)    HCT 32.2 (*)    All other components within normal limits  BRAIN NATRIURETIC PEPTIDE - Abnormal; Notable for the following components:   B Natriuretic Peptide 357.0 (*)    All other components within normal limits  COMPREHENSIVE METABOLIC PANEL - Abnormal; Notable for the following components:   Glucose, Bld 207 (*)    Creatinine, Ser 1.54 (*)    GFR calc non Af Amer 52 (*)    All other components within normal limits  GLUCOSE, CAPILLARY - Abnormal; Notable for the following components:   Glucose-Capillary 239 (*)    All other components within normal limits  CBC - Abnormal; Notable for the following components:   RBC 3.72 (*)    Hemoglobin 11.9 (*)    HCT 32.5 (*)    MCHC 36.6 (*)    All other components within normal limits  CREATININE, SERUM - Abnormal; Notable for the following components:   Creatinine, Ser 1.86 (*)    GFR calc non Af Amer 41 (*)    GFR calc Af Amer 48 (*)    All other components within normal limits  HEMOGLOBIN A1C - Abnormal; Notable for the following  components:   Hgb A1c  MFr Bld 8.3 (*)    All other components within normal limits  TROPONIN I (HIGH SENSITIVITY) - Abnormal; Notable for the following components:   Troponin I (High Sensitivity) 27 (*)    All other components within normal limits  TROPONIN I (HIGH SENSITIVITY) - Abnormal; Notable for the following components:   Troponin I (High Sensitivity) 27 (*)    All other components within normal limits  TROPONIN I (HIGH SENSITIVITY) - Abnormal; Notable for the following components:   Troponin I (High Sensitivity) 35 (*)    All other components within normal limits  SARS CORONAVIRUS 2 AG (30 MIN TAT)  SARS CORONAVIRUS 2 (TAT 6-24 HRS)  HIV ANTIBODY (ROUTINE TESTING W REFLEX)  BASIC METABOLIC PANEL    EKG EKG Interpretation  Date/Time:  Thursday September 10 2019 09:27:45 EST Ventricular Rate:  84 PR Interval:    QRS Duration: 87 QT Interval:  381 QTC Calculation: 451 R Axis:   50 Text Interpretation: Sinus rhythm Probable left atrial enlargement LVH with secondary repolarization abnormality Anterior ST elevation, probably due to LVH No significant change since Confirmed by Virgina Norfolk [phone]) on 09/10/2019 10:00:27 AM   Radiology Dg Chest 2 View  Result Date: 09/10/2019 CLINICAL DATA:  Shortness of breath and chest tightness EXAM: CHEST - 2 VIEW COMPARISON:  October 15, 2017 FINDINGS: There is interstitial thickening in the mid and lower lung zones bilaterally. There is no airspace consolidation or pleural effusion. No volume loss. Heart is upper normal in size. There is mild pulmonary venous hypertension. No adenopathy. No bone lesions. IMPRESSION: There is mild pulmonary venous hypertension. Interstitial prominence in the mid lower lung zones could indicate slight interstitial edema. Question a degree of volume overload. No consolidation or well-defined airspace opacity. No pleural effusions. Heart upper normal in size. Electronically Signed   By: Bretta Bang III M.D.    On: 09/10/2019 09:54    Procedures Procedures (including critical care time)  Medications Ordered in ED Medications  carvedilol (COREG) tablet 25 mg (25 mg Oral Not Given 09/10/19 1613)  acetaminophen (TYLENOL) tablet 650 mg (has no administration in time range)  enoxaparin (LOVENOX) injection 40 mg (has no administration in time range)  sodium chloride flush (NS) 0.9 % injection 3 mL (has no administration in time range)  acetaminophen (TYLENOL) tablet 650 mg (has no administration in time range)    Or  acetaminophen (TYLENOL) suppository 650 mg (has no administration in time range)  ondansetron (ZOFRAN) tablet 4 mg (has no administration in time range)    Or  ondansetron (ZOFRAN) injection 4 mg (has no administration in time range)  furosemide (LASIX) injection 40 mg (has no administration in time range)  amLODipine (NORVASC) tablet 10 mg (has no administration in time range)  aspirin chewable tablet 81 mg (has no administration in time range)  atorvastatin (LIPITOR) tablet 80 mg (has no administration in time range)  carvedilol (COREG) tablet 25 mg (has no administration in time range)  clopidogrel (PLAVIX) tablet 75 mg (has no administration in time range)  insulin aspart (novoLOG) injection 0-9 Units (has no administration in time range)  insulin aspart (novoLOG) injection 0-5 Units (has no administration in time range)  predniSONE (DELTASONE) tablet 40 mg (has no administration in time range)  insulin detemir (LEVEMIR) injection 20 Units (has no administration in time range)  furosemide (LASIX) injection 20 mg (20 mg Intravenous Given 09/10/19 1234)  amLODipine (NORVASC) tablet 10 mg (10 mg Oral Given 09/10/19 1611)  Initial Impression / Assessment and Plan / ED Course  I have reviewed the triage vital signs and the nursing notes.  Pertinent labs & imaging results that were available during my care of the patient were reviewed by me and considered in my medical decision  making (see chart for details).  Clinical Course as of Sep 10 1943  Thu Sep 10, 2019  1152 Spoke to Dr. Tamala Julian with [facility] hospitalist who is going to reach out to see if patient qualifies for outpatient heart failure treatment. Will start IV lasix given patient appears fluid overloaded.   [CC]    Clinical Course User Index [CC] Cheek, Comer Locket, PA-C      48 year old male presents to the ED for an evaluation of sudden onset of shortness of breath associated with cough.  Patient notes shortness of breath is worse lying down.  Chart reviewed. Patient had an echocardiogram on 10/16/2017 which demonstrated LVEF 55% to 60% with mildly dilated left ventricle and mild LVH.  Patient's last cardiac catheterization was on 10/17/2017 which demonstrated significant stenosis in which numerous stents were placed. Patient is afebrile, no tachycardia, no hypoxia. Patient is no acute distress and non-toxic appearing. Lungs clear to auscultation bilaterally. 1+ pitting edema bilaterally. Will order routine labs, BNP, troponin, EKG, and CXR. Patient appears slightly volume overloaded, suspect issues could be related to undiagnosed heart failure.   Troponin elevated at 27. Will get serial troponin. BNP elevated at 357. CMP significant for AKI which appears to be worse than baseline. Hyperglycemia at 207. CBC with hemoglobin at 11.4. Appears hemoglobin is always slightly decreased. EKG reviewed which demonstrates sinus rhythm with anterior ST elevation suggestive of LVH. CXR personally reviewed which demonstrates:  There is mild pulmonary venous hypertension. Interstitial prominence  in the mid lower lung zones could indicate slight interstitial  edema.   Will consult hospitalist for admission given patient's increase in troponin and BNP. See consult note above. Suspect patient has underlying heart failure given his fluid overloaded state. Suspect elevated troponin due to heart failure. CP appears to be atypical for  ACS. Will start IV lasix 20mg .   Final Clinical Impressions(s) / ED Diagnoses   Final diagnoses:  Shortness of breath  Hypervolemia, unspecified hypervolemia type    ED Discharge Orders    None       Jonette Eva, PA-C 09/10/19 1944    Lennice Sites, DO 09/11/19 1224

## 2019-09-10 NOTE — ED Triage Notes (Addendum)
Pt states he is woke up last night with sob.  Some cough, unable to bring any phlegm but swallows it.  Pt states he was coughing so much it caused him to vomit.  Pt denies fever.  Pt states his chest feels sore.

## 2019-09-11 ENCOUNTER — Observation Stay (HOSPITAL_COMMUNITY): Payer: Self-pay

## 2019-09-11 DIAGNOSIS — I509 Heart failure, unspecified: Secondary | ICD-10-CM

## 2019-09-11 LAB — ECHOCARDIOGRAM COMPLETE
Height: 68 in
Weight: 3220.8 oz

## 2019-09-11 LAB — BASIC METABOLIC PANEL
Anion gap: 10 (ref 5–15)
BUN: 19 mg/dL (ref 6–20)
CO2: 25 mmol/L (ref 22–32)
Calcium: 9.3 mg/dL (ref 8.9–10.3)
Chloride: 100 mmol/L (ref 98–111)
Creatinine, Ser: 1.83 mg/dL — ABNORMAL HIGH (ref 0.61–1.24)
GFR calc Af Amer: 49 mL/min — ABNORMAL LOW (ref >60)
GFR calc non Af Amer: 42 mL/min — ABNORMAL LOW (ref >60)
Glucose, Bld: 336 mg/dL — ABNORMAL HIGH (ref 70–99)
Potassium: 4.1 mmol/L (ref 3.5–5.1)
Sodium: 135 mmol/L (ref 135–145)

## 2019-09-11 LAB — GLUCOSE, CAPILLARY
Glucose-Capillary: 328 mg/dL — ABNORMAL HIGH (ref 70–99)
Glucose-Capillary: 343 mg/dL — ABNORMAL HIGH (ref 70–99)
Glucose-Capillary: 285 mg/dL — ABNORMAL HIGH (ref 70–99)
Glucose-Capillary: 160 mg/dL — ABNORMAL HIGH (ref 70–99)

## 2019-09-11 MED ORDER — INSULIN ASPART 100 UNIT/ML ~~LOC~~ SOLN
6.0000 [IU] | Freq: Three times a day (TID) | SUBCUTANEOUS | Status: DC
  Administered 2019-09-11 – 2019-09-12 (×3): 6 [IU] via SUBCUTANEOUS

## 2019-09-11 MED ORDER — FUROSEMIDE 10 MG/ML IJ SOLN
60.0000 mg | Freq: Two times a day (BID) | INTRAMUSCULAR | Status: DC
  Administered 2019-09-11 – 2019-09-12 (×2): 60 mg via INTRAVENOUS
  Filled 2019-09-11 (×2): qty 6

## 2019-09-11 MED ORDER — INSULIN DETEMIR 100 UNIT/ML ~~LOC~~ SOLN
15.0000 [IU] | Freq: Two times a day (BID) | SUBCUTANEOUS | Status: DC
  Administered 2019-09-11 – 2019-09-12 (×2): 15 [IU] via SUBCUTANEOUS
  Filled 2019-09-11 (×3): qty 0.15

## 2019-09-11 NOTE — Plan of Care (Signed)
  Problem: Nutrition: Goal: Adequate nutrition will be maintained Outcome: Completed/Met   Problem: Coping: Goal: Level of anxiety will decrease Outcome: Completed/Met   Problem: Elimination: Goal: Will not experience complications related to bowel motility Outcome: Completed/Met Goal: Will not experience complications related to urinary retention Outcome: Completed/Met   Problem: Safety: Goal: Ability to remain free from injury will improve Outcome: Completed/Met   Problem: Skin Integrity: Goal: Risk for impaired skin integrity will decrease Outcome: Completed/Met   

## 2019-09-11 NOTE — Progress Notes (Signed)
Echocardiogram 2D Echocardiogram has been performed.  Oneal Deputy Koury Roddy 09/11/2019, 8:46 AM

## 2019-09-11 NOTE — Progress Notes (Signed)
Progress Note    Matthew Fisher  TGG:[id number] DOB: November 17, 1970  DOA: 09/10/2019 PCP: System, Pcp Not In    Brief Narrative:     Medical records reviewed and are as summarized below:  Matthew Fisher is an 48 y.o. male with a Past Medical History of CAD with NSTEMI in 2019, HTN, DM who presents with shortness of breath that started yesterday while tending to his horse. After rest, SOB improved. He then had another episode of SOB while sleeping. He admits to dry cough as well as some central chest soreness, which he attributes to coughing.   Assessment/Plan:   Principal Problem:   Suspected congestive heart failure Active Problems:   Insulin dependent diabetes mellitus   Hypertension   CAD (coronary artery disease)   Nonadherence to medical treatment   Tobacco abuse disorder     Suspected congestive heart failure-unknown type, await echo -Presents with acute onset of shortness of breath with abnormal chest x-ray and increased urinary output and improvement in symptoms after administration of IV Lasix -Suspect etiology secondary to dietary medication nonadherence-patient with positive voltage criteria for LVH on EKG and echocardiogram from 2019 also revealed mild LVH -Lasix 60 mg IV every 12 hours -Daily weights with strict I's/O -Obtain echocardiogram -Resume preadmission carvedilol-was not on ACE inhibitor or ARB prior to admission    CAD (coronary artery disease) -Current presenting symptoms not consistent with prior ischemic symptoms from 2019 -Patient does have multivessel CAD and underwent PCI to 4 vessels previously but not candidate for CABG -Continue Plavix -Flat trend in high-sensitivity troponin and likely related to suspected heart failure -If regional wall motion abnormalities may need to consult cardiology for input -Continue Lipitor and beta-blocker    Insulin dependent diabetes mellitus with hyperglycemia -Continue 70/30 insulin but increased dose  especially while on steroids -hemoglobin A1c: 8.3 -  SSI plus novolog     Hypertension -Uncontrolled at presentation likely related to volume overload from suspected heart failure -Resume Norvasc and carvedilol -Pending results of echocardiogram may need to DC Norvasc in favor of ACE inhibitor or ARB or hydralazine     Nonadherence to medical treatment -Patient continues to smoke, is inconsistent with utilization of insulin and checking CBGs and forgets to take antihypertensive medications as well -encouraged compliance    Tobacco abuse disorder -encourage cessation  obesity Body mass index is 30.61 kg/m.   Family Communication/Anticipated D/C date and plan/Code Status   DVT prophylaxis: Lovenox ordered. Code Status: Full Code.  Family Communication:  Disposition Plan: pending duiresis   Medical Consultants:    None   Subjective:   Has not been taking his medications at home  Objective:    Vitals:   09/10/19 2031 09/10/19 2315 09/11/19 0537 09/11/19 0738  BP: (!) 173/89 (!) 162/84 (!) 152/89 (!) 159/95  Pulse: 88 84 80 85  Resp: '18 18 16 20  ' Temp: 98.7 F (37.1 C) 99.3 F (37.4 C) 98.3 F (36.8 C) 99.1 F (37.3 C)  TempSrc: Oral Oral Oral Oral  SpO2: 99% 98% 100% 99%  Weight:   91.3 kg   Height:        Intake/Output Summary (Last 24 hours) at 09/11/2019 1108 Last data filed at 09/11/2019 0850 Gross per 24 hour  Intake 760 ml  Output 2845 ml  Net -2085 ml   Filed Weights   09/10/19 0922 09/10/19 1701 09/11/19 0537  Weight: 94.3 kg 93.2 kg 91.3 kg    Exam: In bed, NAD Crackles on right  base Min LE edema +BS, soft, NT A+Ox3  Data Reviewed:   I have personally reviewed following labs and imaging studies:  Labs: Labs show the following:   Basic Metabolic Panel: Recent Labs  Lab 09/10/19 1014 09/10/19 1808 09/11/19 0548  NA 136  --  135  K 4.2  --  4.1  CL 107  --  100  CO2 23  --  25  GLUCOSE 207*  --  336*  BUN 17  --  19   CREATININE 1.54* 1.86* 1.83*  CALCIUM 8.9  --  9.3   GFR Estimated Creatinine Clearance: 53 mL/min (A) (by C-G formula based on SCr of 1.83 mg/dL (H)). Liver Function Tests: Recent Labs  Lab 09/10/19 1014  AST 18  ALT 13  ALKPHOS 91  BILITOT 0.5  PROT 7.1  ALBUMIN 3.5   No results for input(s): LIPASE, AMYLASE in the last 168 hours. No results for input(s): AMMONIA in the last 168 hours. Coagulation profile No results for input(s): INR, PROTIME in the last 168 hours.  CBC: Recent Labs  Lab 09/10/19 1014 09/10/19 1808  WBC 7.2 7.7  HGB 11.4* 11.9*  HCT 32.2* 32.5*  MCV 89.4 87.4  PLT 222 231   Cardiac Enzymes: No results for input(s): CKTOTAL, CKMB, CKMBINDEX, TROPONINI in the last 168 hours. BNP (last 3 results) No results for input(s): PROBNP in the last 8760 hours. CBG: Recent Labs  Lab 09/10/19 1651 09/10/19 2125 09/11/19 0613  GLUCAP 239* 251* 328*   D-Dimer: No results for input(s): DDIMER in the last 72 hours. Hgb A1c: Recent Labs    09/10/19 1808  HGBA1C 8.3*   Lipid Profile: No results for input(s): CHOL, HDL, LDLCALC, TRIG, CHOLHDL, LDLDIRECT in the last 72 hours. Thyroid function studies: No results for input(s): TSH, T4TOTAL, T3FREE, THYROIDAB in the last 72 hours.  Invalid input(s): FREET3 Anemia work up: No results for input(s): VITAMINB12, FOLATE, FERRITIN, TIBC, IRON, RETICCTPCT in the last 72 hours. Sepsis Labs: Recent Labs  Lab 09/10/19 1014 09/10/19 1808  WBC 7.2 7.7    Microbiology Recent Results (from the past 240 hour(s))  SARS Coronavirus 2 Ag (30 min TAT) - Nasal Swab (BD Veritor Kit)     Status: None   Collection Time: 09/10/19 10:14 AM   Specimen: Nasal Swab (BD Veritor Kit)  Result Value Ref Range Status   SARS Coronavirus 2 Ag NEGATIVE NEGATIVE Final    Comment: (NOTE) SARS-CoV-2 antigen NOT DETECTED.  Negative results are presumptive.  Negative results do not preclude SARS-CoV-2 infection and should not be used  as the sole basis for treatment or other patient management decisions, including infection  control decisions, particularly in the presence of clinical signs and  symptoms consistent with COVID-19, or in those who have been in contact with the virus.  Negative results must be combined with clinical observations, patient history, and epidemiological information. The expected result is Negative. Fact Sheet for Patients: PodPark.tn Fact Sheet for Healthcare Providers: GiftContent.is This test is not yet approved or cleared by the [place] FDA and  has been authorized for detection and/or diagnosis of SARS-CoV-2 by FDA under an Emergency Use Authorization (EUA).  This EUA will remain in effect (meaning this test can be used) for the duration of  the COVID-19 de claration under Section 564(b)(1) of the Act, 21 U.S.C. section 360bbb-3(b)(1), unless the authorization is terminated or revoked sooner. Performed at [facility], [address] [place], [place] [address]  SARS CORONAVIRUS 2 (TAT 6-24 HRS) Nasopharyngeal Nasopharyngeal Swab     Status: None   Collection Time: 09/10/19 12:32 PM   Specimen: Nasopharyngeal Swab  Result Value Ref Range Status   SARS Coronavirus 2 NEGATIVE NEGATIVE Final    Comment: (NOTE) SARS-CoV-2 target nucleic acids are NOT DETECTED. The SARS-CoV-2 RNA is generally detectable in upper and lower respiratory specimens during the acute phase of infection. Negative results do not preclude SARS-CoV-2 infection, do not rule out co-infections with other pathogens, and should not be used as the sole basis for treatment or other patient management decisions. Negative results must be combined with clinical observations, patient history, and epidemiological information. The expected result is Negative. Fact Sheet for Patients: SugarRoll.be Fact Sheet for Healthcare  Providers: https://www.woods-mathews.com/ This test is not yet approved or cleared by the [place] FDA and  has been authorized for detection and/or diagnosis of SARS-CoV-2 by FDA under an Emergency Use Authorization (EUA). This EUA will remain  in effect (meaning this test can be used) for the duration of the COVID-19 declaration under Section 56 4(b)(1) of the Act, 21 U.S.C. section 360bbb-3(b)(1), unless the authorization is terminated or revoked sooner. Performed at [facility], [place] [address] [place], [place] [address]     Procedures and diagnostic studies:  Dg Chest 2 View  Result Date: 09/10/2019 CLINICAL DATA:  Shortness of breath and chest tightness EXAM: CHEST - 2 VIEW COMPARISON:  October 15, 2017 FINDINGS: There is interstitial thickening in the mid and lower lung zones bilaterally. There is no airspace consolidation or pleural effusion. No volume loss. Heart is upper normal in size. There is mild pulmonary venous hypertension. No adenopathy. No bone lesions. IMPRESSION: There is mild pulmonary venous hypertension. Interstitial prominence in the mid lower lung zones could indicate slight interstitial edema. Question a degree of volume overload. No consolidation or well-defined airspace opacity. No pleural effusions. Heart upper normal in size. Electronically Signed   By: Lowella Grip III M.D.   On: 09/10/2019 09:54    Medications:   . amLODipine  10 mg Oral Daily  . aspirin  81 mg Oral Daily  . atorvastatin  80 mg Oral q1800  . carvedilol  25 mg Oral BID WC  . clopidogrel  75 mg Oral Daily  . enoxaparin (LOVENOX) injection  40 mg Subcutaneous Q24H  . furosemide  60 mg Intravenous Q12H  . insulin aspart  0-5 Units Subcutaneous QHS  . insulin aspart  0-9 Units Subcutaneous TID WC  . insulin aspart  6 Units Subcutaneous TID WC  . insulin detemir  15 Units Subcutaneous BID  . predniSONE  40 mg Oral Q breakfast  . sodium chloride flush  3 mL  Intravenous Q12H   Continuous Infusions:   LOS: 1 day   Geradine Girt  [facility]   How to contact the [facility] Attending or Consulting provider [place] or covering provider during after hours [place], for this patient?  1. Check the care team in [facility] and look for a) attending/consulting [facility] provider listed and b) the [facility] team listed 2. Log into [email or web address] and use [place]'s universal password to access. If you do not have the password, please contact the hospital operator. 3. Locate the [facility] provider you are looking for under [facility] and page to a number that you can be directly reached. 4. If you still have difficulty reaching the provider, please page the [facility] (Director on Call) for the Hospitalists  listed on amion for assistance.  09/11/2019, 11:08 AM

## 2019-09-11 NOTE — Progress Notes (Signed)
Inpatient Diabetes Program Recommendations  AACE/ADA: New Consensus Statement on Inpatient Glycemic Control (2015)  Target Ranges:  Prepandial:   less than 140 mg/dL      Peak postprandial:   less than 180 mg/dL (1-2 hours)      Critically ill patients:  140 - 180 mg/dL   Lab Results  Component Value Date   GLUCAP 328 (H) 09/11/2019   HGBA1C 8.3 (H) 09/10/2019    Review of Glycemic Control Results for PARTH, MCCORMAC (MRN [id number]) as of 09/11/2019 10:37  Ref. Range 09/10/2019 16:51 09/10/2019 21:25 09/11/2019 06:13  Glucose-Capillary Latest Ref Range: 70 - 99 mg/dL 239 (H) 251 (H) 328 (H)   Diabetes history: DM 2 Outpatient Diabetes medications:  Levemir 20 units daily Current orders for Inpatient glycemic control:  Levemir 20 units daily, Novolog sensitive tid with meals and HS, Prednisone 40 mg daily Inpatient Diabetes Program Recommendations:   Please increase Levemir to 15 units bid.   Also please add Novolog 6 units tid with meals (hold if patient eats less than 50%).   Thanks,   Adah Perl, RN, BC-ADM Inpatient Diabetes Coordinator Pager [phone] (8a-5p)

## 2019-09-12 LAB — BASIC METABOLIC PANEL
Anion gap: 10 (ref 5–15)
BUN: 31 mg/dL — ABNORMAL HIGH (ref 6–20)
CO2: 29 mmol/L (ref 22–32)
Calcium: 8.8 mg/dL — ABNORMAL LOW (ref 8.9–10.3)
Chloride: 99 mmol/L (ref 98–111)
Creatinine, Ser: 2.09 mg/dL — ABNORMAL HIGH (ref 0.61–1.24)
GFR calc Af Amer: 42 mL/min — ABNORMAL LOW (ref >60)
GFR calc non Af Amer: 36 mL/min — ABNORMAL LOW (ref >60)
Glucose, Bld: 205 mg/dL — ABNORMAL HIGH (ref 70–99)
Potassium: 3.9 mmol/L (ref 3.5–5.1)
Sodium: 138 mmol/L (ref 135–145)

## 2019-09-12 LAB — CBC
HCT: 32.8 % — ABNORMAL LOW (ref 39.0–52.0)
Hemoglobin: 12.1 g/dL — ABNORMAL LOW (ref 13.0–17.0)
MCH: 32 pg (ref 26.0–34.0)
MCHC: 36.9 g/dL — ABNORMAL HIGH (ref 30.0–36.0)
MCV: 86.8 fL (ref 80.0–100.0)
Platelets: 249 10*3/uL (ref 150–400)
RBC: 3.78 MIL/uL — ABNORMAL LOW (ref 4.22–5.81)
RDW: 11.9 % (ref 11.5–15.5)
WBC: 7.9 10*3/uL (ref 4.0–10.5)
nRBC: 0 % (ref 0.0–0.2)

## 2019-09-12 LAB — GLUCOSE, CAPILLARY
Glucose-Capillary: 183 mg/dL — ABNORMAL HIGH (ref 70–99)
Glucose-Capillary: 186 mg/dL — ABNORMAL HIGH (ref 70–99)
Glucose-Capillary: 178 mg/dL — ABNORMAL HIGH (ref 70–99)

## 2019-09-12 MED ORDER — FUROSEMIDE 40 MG PO TABS: 40.0000 mg | ORAL_TABLET | Freq: Every day | ORAL | 11 refills | Status: DC

## 2019-09-12 MED ORDER — ACETAMINOPHEN 325 MG PO TABS: 650.0000 mg | ORAL_TABLET | ORAL | 0 refills | Status: DC | PRN

## 2019-09-12 MED ORDER — POTASSIUM CHLORIDE ER 20 MEQ PO TBCR: 20.0000 meq | EXTENDED_RELEASE_TABLET | Freq: Every day | ORAL | 0 refills | Status: DC

## 2019-09-12 NOTE — Discharge Summary (Signed)
Physician Discharge Summary  Matthew Fisher [id number]:[id number] DOB: January 14, 1971 DOA: 09/10/2019  PCP: System, Pcp Not In  Admit date: 09/10/2019 Discharge date: 09/12/2019  Recommendations for Outpatient Follow-up:  Take medications as prescribed. Chemistry to be drawn on 09/16/2019 and reported to PCP. Follow up with PCP in 7-10 days.  Discharge Diagnoses: Principal diagnosis is #1 1. Acute hypoxic respiratory failure 2. Hypertensivie urgency 3. Pulmonary edema 4. DM II 5. Medical non-compliance 6. Obesity  Discharge Condition: The patient is resting comfortably. No new complaints.  Disposition: Home  Diet recommendation: Heart healthy/carbohydrate controlled  Filed Weights   09/10/19 1701 09/11/19 0537 09/12/19 0500  Weight: 93.2 kg 91.3 kg 91.9 kg    History of present illness:  Patient was seen, examined, treatment plan was discussed with the Advance Practice Provider. I have personally reviewed the clinical findings, lab, EKG, imaging studies and management of this patient in detail. I have also reviewed the orders written for this patient which were under my direction. I agree with the documentation, as recorded by the Advance Practice Provider.   Matthew Fisher is a 48 y.o. male with a Past Medical History of CAD with NSTEMI in 2019, HTN, DM who presents with shortness of breath that started yesterday while tending to his horse. After rest, SOB improved. He then had another episode of SOB while sleeping. He admits to dry cough as well as some central chest soreness, which he attributes to coughing. Denies fevers, sick contacts.   Hospital Course:  The patient was admitted to a telemetry bed. Echocardiogram was obtained that demonstrated EF of 55-6-%, moderately increased left ventricular hypertrophy, global right ventricle has normal systolic function. No significant valvular abnormalities or wall motion abnormalities are noted. He was diuresed with lasix. His blood pressures  were controlled with Norvasc and Coreg. This morning the patient was saturating 100% on room air.  Today's assessment: S: The patient is resting comfortably. No new complaints. O: Vitals:  Vitals:   09/12/19 0917 09/12/19 1151  BP: 125/72 125/73  Pulse: 75 72  Resp: 17 17  Temp: 98 F (36.7 C) 98.2 F (36.8 C)  SpO2: 100% 100%   Exam:  Constitutional:   The patient is awake, alert, and oriented x 3. No acute distress. Eyes:   pupils and irises appear normal  Normal lids and conjunctivae Respiratory:   No increased work of breathing.  No wheezes, rales, or rhonchi  No tactile fremitus Cardiovascular:   Regular rate and rhythm  No murmurs, ectopy, or gallups.  No lateral PMI. No thrills. Abdomen:   Abdomen is soft, non-tender, non-distended  No hernias, masses, or organomegaly  Normoactive bowel sounds.  Musculoskeletal:   No cyanosis, clubbing, or edema Skin:   No rashes, lesions, ulcers  palpation of skin: no induration or nodules Neurologic:   CN 2-12 intact  Sensation all 4 extremities intact Psychiatric:   Mental status o Mood, affect appropriate o Orientation to person, place, time   judgment and insight appear intact   Discharge Instructions  Discharge Instructions    Activity as tolerated - No restrictions   Complete by: As directed    Call MD for:  persistant nausea and vomiting   Complete by: As directed    Call MD for:  severe uncontrolled pain   Complete by: As directed    Diet - low sodium heart healthy   Complete by: As directed    Discharge instructions   Complete by: As directed    Take medications as  prescribed. Chemistry to be drawn on 09/16/2019 and reported to PCP. Follow up with PCP in 7-10 days.   Increase activity slowly   Complete by: As directed      Allergies as of 09/12/2019   No Known Allergies     Medication List    STOP taking these medications   allopurinol 100 MG tablet Commonly known as:  ZYLOPRIM   HYDROcodone-acetaminophen 5-325 MG tablet Commonly known as: NORCO/VICODIN   metoCLOPramide 10 MG tablet Commonly known as: REGLAN   naproxen 500 MG tablet Commonly known as: NAPROSYN   promethazine 25 MG suppository Commonly known as: PHENERGAN     TAKE these medications   acetaminophen 325 MG tablet Commonly known as: TYLENOL Take 2 tablets (650 mg total) by mouth every 4 (four) hours as needed for headache or mild pain.   amLODipine 10 MG tablet Commonly known as: NORVASC Take 1 tablet (10 mg total) by mouth daily.   aspirin 81 MG chewable tablet Chew 1 tablet (81 mg total) by mouth daily.   atorvastatin 80 MG tablet Commonly known as: LIPITOR Take 1 tablet (80 mg total) by mouth daily at 6 PM.   carvedilol 25 MG tablet Commonly known as: COREG Take 1 tablet (25 mg total) by mouth 2 (two) times daily with a meal.   clopidogrel 75 MG tablet Commonly known as: PLAVIX Take 1 tablet (75 mg total) by mouth daily.   ferrous sulfate 325 (65 FE) MG tablet Take 325 mg by mouth every morning.   furosemide 40 MG tablet Commonly known as: Lasix Take 1 tablet (40 mg total) by mouth daily.   insulin detemir 100 UNIT/ML injection Commonly known as: LEVEMIR Inject 20 Units into the skin daily.   nitroGLYCERIN 0.4 MG SL tablet Commonly known as: NITROSTAT Place 0.4 mg under the tongue every 5 (five) minutes as needed for chest pain.   polyethylene glycol 17 g packet Commonly known as: MiraLax Take 17 g by mouth daily. What changed:   when to take this  reasons to take this   Potassium Chloride ER 20 MEQ Tbcr Take 20 mEq by mouth daily.   Protonix 40 MG tablet Generic drug: pantoprazole Take 40 mg by mouth 2 (two) times daily.   Vitamin D (Ergocalciferol) 1.25 MG (50000 UT) Caps capsule Commonly known as: DRISDOL Take by mouth every 7 (seven) days.      No Known Allergies  The results of significant diagnostics from this hospitalization  (including imaging, microbiology, ancillary and laboratory) are listed below for reference.    Significant Diagnostic Studies: Dg Chest 2 View  Result Date: 09/10/2019 CLINICAL DATA:  Shortness of breath and chest tightness EXAM: CHEST - 2 VIEW COMPARISON:  October 15, 2017 FINDINGS: There is interstitial thickening in the mid and lower lung zones bilaterally. There is no airspace consolidation or pleural effusion. No volume loss. Heart is upper normal in size. There is mild pulmonary venous hypertension. No adenopathy. No bone lesions. IMPRESSION: There is mild pulmonary venous hypertension. Interstitial prominence in the mid lower lung zones could indicate slight interstitial edema. Question a degree of volume overload. No consolidation or well-defined airspace opacity. No pleural effusions. Heart upper normal in size. Electronically Signed   By: Lowella Grip III M.D.   On: 09/10/2019 09:54    Microbiology: Recent Results (from the past 240 hour(s))  SARS Coronavirus 2 Ag (30 min TAT) - Nasal Swab (BD Veritor Kit)     Status: None   Collection Time:  09/10/19 10:14 AM   Specimen: Nasal Swab (BD Veritor Kit)  Result Value Ref Range Status   SARS Coronavirus 2 Ag NEGATIVE NEGATIVE Final    Comment: (NOTE) SARS-CoV-2 antigen NOT DETECTED.  Negative results are presumptive.  Negative results do not preclude SARS-CoV-2 infection and should not be used as the sole basis for treatment or other patient management decisions, including infection  control decisions, particularly in the presence of clinical signs and  symptoms consistent with COVID-19, or in those who have been in contact with the virus.  Negative results must be combined with clinical observations, patient history, and epidemiological information. The expected result is Negative. Fact Sheet for Patients: PodPark.tn Fact Sheet for Healthcare Providers: GiftContent.is This  test is not yet approved or cleared by the [place] FDA and  has been authorized for detection and/or diagnosis of SARS-CoV-2 by FDA under an Emergency Use Authorization (EUA).  This EUA will remain in effect (meaning this test can be used) for the duration of  the COVID-19 de claration under Section 564(b)(1) of the Act, 21 U.S.C. section 360bbb-3(b)(1), unless the authorization is terminated or revoked sooner. Performed at [facility], [address] [place], [place] [address]   SARS CORONAVIRUS 2 (TAT 6-24 HRS) Nasopharyngeal Nasopharyngeal Swab     Status: None   Collection Time: 09/10/19 12:32 PM   Specimen: Nasopharyngeal Swab  Result Value Ref Range Status   SARS Coronavirus 2 NEGATIVE NEGATIVE Final    Comment: (NOTE) SARS-CoV-2 target nucleic acids are NOT DETECTED. The SARS-CoV-2 RNA is generally detectable in upper and lower respiratory specimens during the acute phase of infection. Negative results do not preclude SARS-CoV-2 infection, do not rule out co-infections with other pathogens, and should not be used as the sole basis for treatment or other patient management decisions. Negative results must be combined with clinical observations, patient history, and epidemiological information. The expected result is Negative. Fact Sheet for Patients: SugarRoll.be Fact Sheet for Healthcare Providers: https://www.woods-mathews.com/ This test is not yet approved or cleared by the [place] FDA and  has been authorized for detection and/or diagnosis of SARS-CoV-2 by FDA under an Emergency Use Authorization (EUA). This EUA will remain  in effect (meaning this test can be used) for the duration of the COVID-19 declaration under Section 56 4(b)(1) of the Act, 21 U.S.C. section 360bbb-3(b)(1), unless the authorization is terminated or revoked sooner. Performed at [facility], [place] [address] [place],  [place] [address]      Labs: Basic Metabolic Panel: Recent Labs  Lab 09/10/19 1014 09/10/19 1808 09/11/19 0548 09/12/19 0452  NA 136  --  135 138  K 4.2  --  4.1 3.9  CL 107  --  100 99  CO2 23  --  25 29  GLUCOSE 207*  --  336* 205*  BUN 17  --  19 31*  CREATININE 1.54* 1.86* 1.83* 2.09*  CALCIUM 8.9  --  9.3 8.8*   Liver Function Tests: Recent Labs  Lab 09/10/19 1014  AST 18  ALT 13  ALKPHOS 91  BILITOT 0.5  PROT 7.1  ALBUMIN 3.5   No results for input(s): LIPASE, AMYLASE in the last 168 hours. No results for input(s): AMMONIA in the last 168 hours. CBC: Recent Labs  Lab 09/10/19 1014 09/10/19 1808 09/12/19 0452  WBC 7.2 7.7 7.9  HGB 11.4* 11.9* 12.1*  HCT 32.2* 32.5* 32.8*  MCV 89.4 87.4 86.8  PLT 222 231 249  Cardiac Enzymes: No results for input(s): CKTOTAL, CKMB, CKMBINDEX, TROPONINI in the last 168 hours. BNP: BNP (last 3 results) Recent Labs    09/10/19 1014  BNP 357.0*    ProBNP (last 3 results) No results for input(s): PROBNP in the last 8760 hours.  CBG: Recent Labs  Lab 09/11/19 1634 09/11/19 2151 09/12/19 0625 09/12/19 0701 09/12/19 1148  GLUCAP 285* 160* 183* 186* 178*    Principal Problem:   Suspected congestive heart failure Active Problems:   Insulin dependent diabetes mellitus   Hypertension   CAD (coronary artery disease)   Nonadherence to medical treatment   Tobacco abuse disorder   CHF (congestive heart failure) ([place])   Time coordinating discharge: 38 minutes  Signed:        Doraine Schexnider, DO [facility]  09/12/2019, 4:11 PM

## 2019-09-12 NOTE — Progress Notes (Signed)
SATURATION QUALIFICATIONS: (This note is used to comply with regulatory documentation for home oxygen)  Patient Saturations on Room Air at Rest = 97%  Patient Saturations on Room Air while Ambulating = 96%   Pt does not need home Oxygen

## 2019-09-14 MED FILL — FUROSEMIDE 40 MG TAB: 40 | 60 days supply | Qty: 60 | Fill #0

## 2019-09-14 MED FILL — ACETAMINOPHEN 325 MG TABS: 325 | 8 days supply | Qty: 100 | Fill #0

## 2019-09-14 MED FILL — POTASSIUM CL ER 20 MEQ TAB: 20 | 30 days supply | Qty: 30 | Fill #0

## 2020-10-13 ENCOUNTER — Emergency Department (HOSPITAL_BASED_OUTPATIENT_CLINIC_OR_DEPARTMENT_OTHER)
Admission: EM | Admit: 2020-10-13 | Discharge: 2020-10-13 | Disposition: A | Discharge status: Home / Self Care | Payer: Self-pay | Attending: Emergency Medicine | Admitting: Emergency Medicine

## 2020-10-13 ENCOUNTER — Encounter (HOSPITAL_BASED_OUTPATIENT_CLINIC_OR_DEPARTMENT_OTHER): Payer: Self-pay

## 2020-10-13 ENCOUNTER — Other Ambulatory Visit: Payer: Self-pay

## 2020-10-13 ENCOUNTER — Emergency Department (HOSPITAL_BASED_OUTPATIENT_CLINIC_OR_DEPARTMENT_OTHER): Payer: Self-pay

## 2020-10-13 DIAGNOSIS — I2510 Atherosclerotic heart disease of native coronary artery without angina pectoris: Secondary | ICD-10-CM | POA: Insufficient documentation

## 2020-10-13 DIAGNOSIS — Z79899 Other long term (current) drug therapy: Secondary | ICD-10-CM | POA: Insufficient documentation

## 2020-10-13 DIAGNOSIS — I509 Heart failure, unspecified: Secondary | ICD-10-CM | POA: Insufficient documentation

## 2020-10-13 DIAGNOSIS — F17210 Nicotine dependence, cigarettes, uncomplicated: Secondary | ICD-10-CM | POA: Insufficient documentation

## 2020-10-13 DIAGNOSIS — Z794 Long term (current) use of insulin: Secondary | ICD-10-CM | POA: Insufficient documentation

## 2020-10-13 DIAGNOSIS — Z955 Presence of coronary angioplasty implant and graft: Secondary | ICD-10-CM | POA: Insufficient documentation

## 2020-10-13 DIAGNOSIS — F17290 Nicotine dependence, other tobacco product, uncomplicated: Secondary | ICD-10-CM | POA: Insufficient documentation

## 2020-10-13 DIAGNOSIS — R0789 Other chest pain: Secondary | ICD-10-CM | POA: Insufficient documentation

## 2020-10-13 DIAGNOSIS — E1122 Type 2 diabetes mellitus with diabetic chronic kidney disease: Secondary | ICD-10-CM | POA: Insufficient documentation

## 2020-10-13 DIAGNOSIS — I130 Hypertensive heart and chronic kidney disease with heart failure and stage 1 through stage 4 chronic kidney disease, or unspecified chronic kidney disease: Secondary | ICD-10-CM | POA: Insufficient documentation

## 2020-10-13 DIAGNOSIS — Z7901 Long term (current) use of anticoagulants: Secondary | ICD-10-CM | POA: Insufficient documentation

## 2020-10-13 DIAGNOSIS — Z7982 Long term (current) use of aspirin: Secondary | ICD-10-CM | POA: Insufficient documentation

## 2020-10-13 DIAGNOSIS — N182 Chronic kidney disease, stage 2 (mild): Secondary | ICD-10-CM | POA: Insufficient documentation

## 2020-10-13 LAB — CBC
HCT: 30 % — ABNORMAL LOW (ref 39.0–52.0)
Hemoglobin: 11 g/dL — ABNORMAL LOW (ref 13.0–17.0)
MCH: 31.8 pg (ref 26.0–34.0)
MCHC: 36.7 g/dL — ABNORMAL HIGH (ref 30.0–36.0)
MCV: 86.7 fL (ref 80.0–100.0)
Platelets: 209 10*3/uL (ref 150–400)
RBC: 3.46 MIL/uL — ABNORMAL LOW (ref 4.22–5.81)
RDW: 12 % (ref 11.5–15.5)
WBC: 5.7 10*3/uL (ref 4.0–10.5)
nRBC: 0 % (ref 0.0–0.2)

## 2020-10-13 LAB — BASIC METABOLIC PANEL
Anion gap: 12 (ref 5–15)
BUN: 31 mg/dL — ABNORMAL HIGH (ref 6–20)
CO2: 20 mmol/L — ABNORMAL LOW (ref 22–32)
Calcium: 8.9 mg/dL (ref 8.9–10.3)
Chloride: 102 mmol/L (ref 98–111)
Creatinine, Ser: 2.16 mg/dL — ABNORMAL HIGH (ref 0.61–1.24)
GFR, Estimated: 36 mL/min — ABNORMAL LOW (ref >60)
Glucose, Bld: 165 mg/dL — ABNORMAL HIGH (ref 70–99)
Potassium: 4 mmol/L (ref 3.5–5.1)
Sodium: 134 mmol/L — ABNORMAL LOW (ref 135–145)

## 2020-10-13 LAB — TROPONIN I (HIGH SENSITIVITY)
Troponin I (High Sensitivity): 9 ng/L (ref <18)
Troponin I (High Sensitivity): 9 ng/L (ref <18)

## 2020-10-13 IMAGING — DX DG CHEST 2V
2 series · 2 of 2 positions shown · non-contrast
Comparison: [DATE]

CLINICAL DATA: Chest pain

EXAM:
CHEST - 2 VIEW

[chest pa]
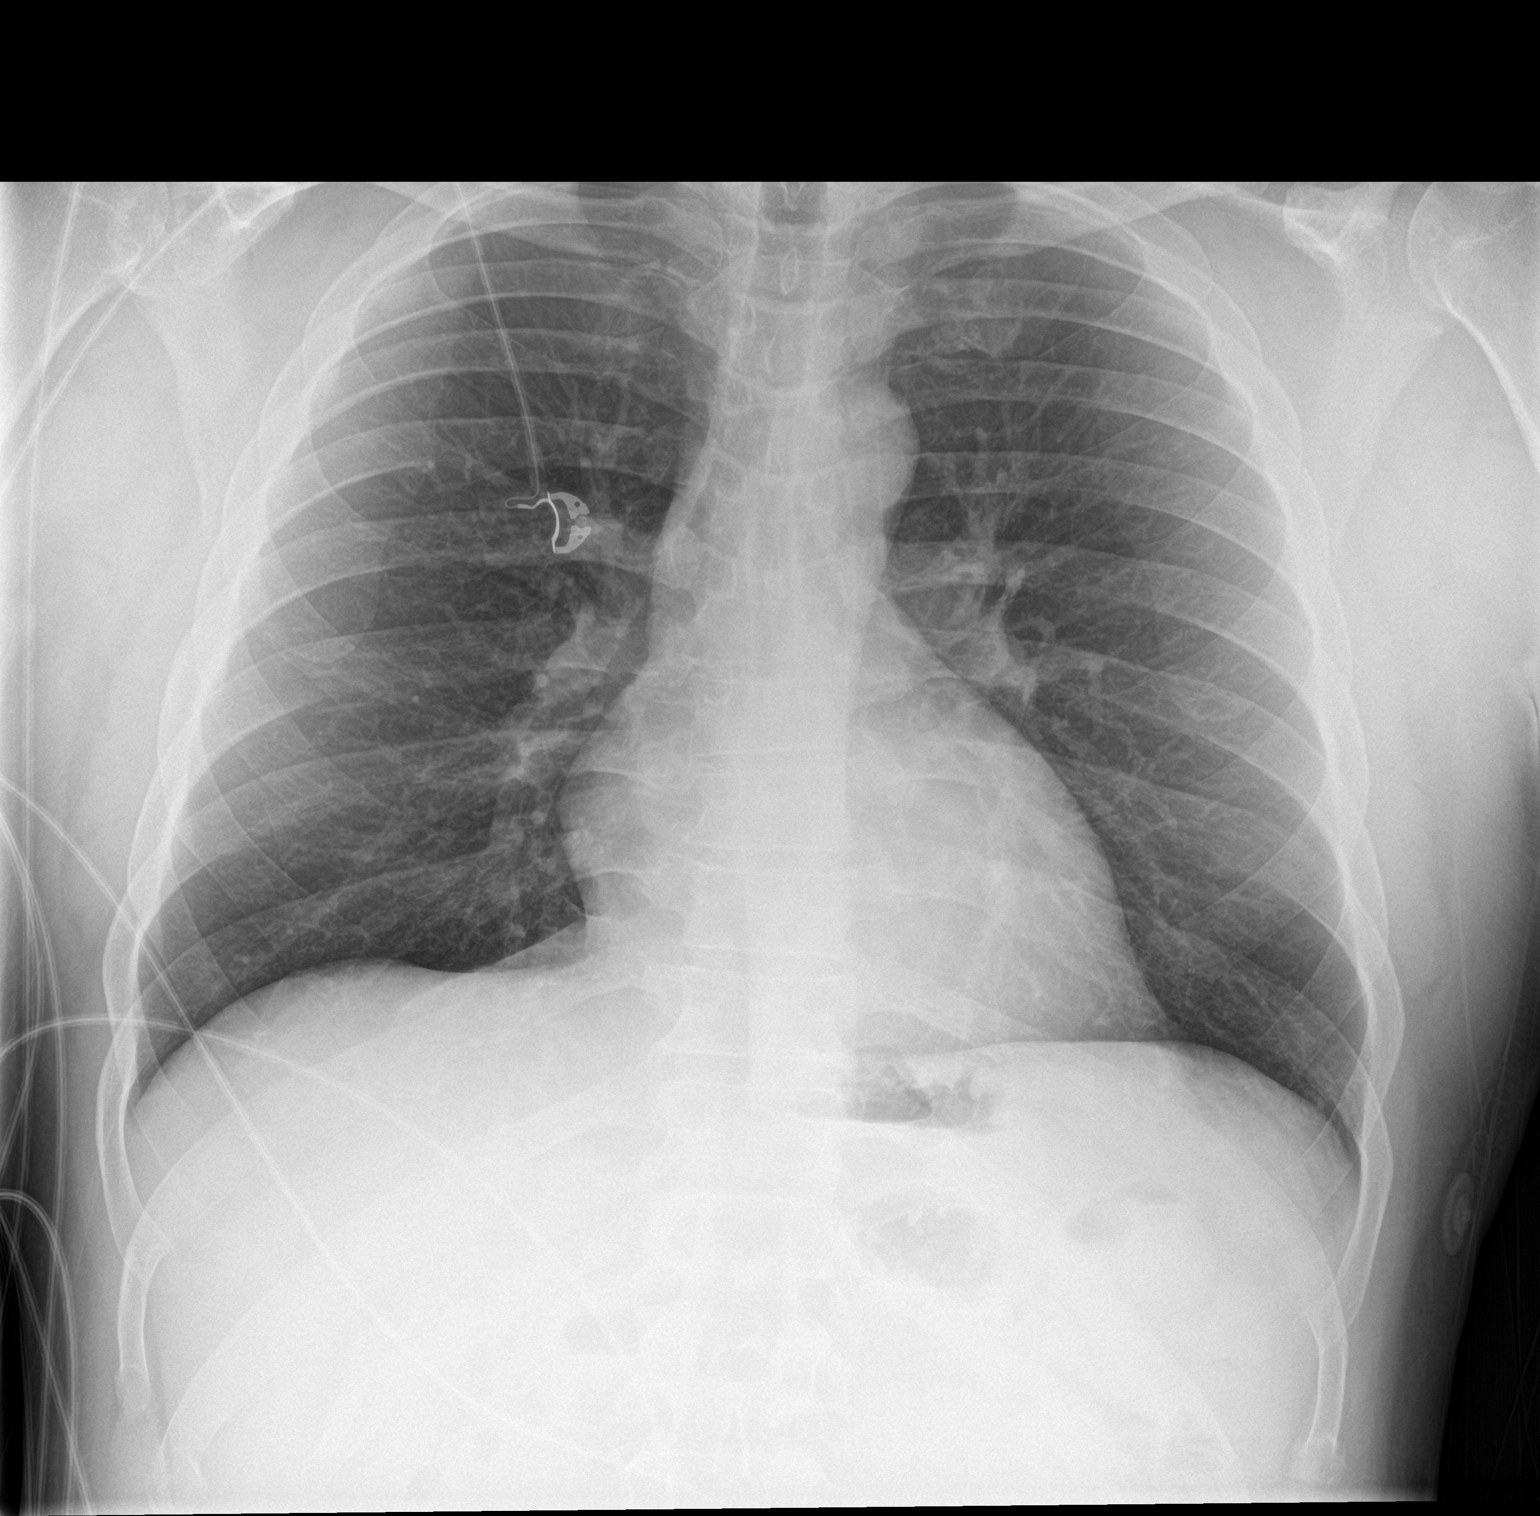

[chest lat]
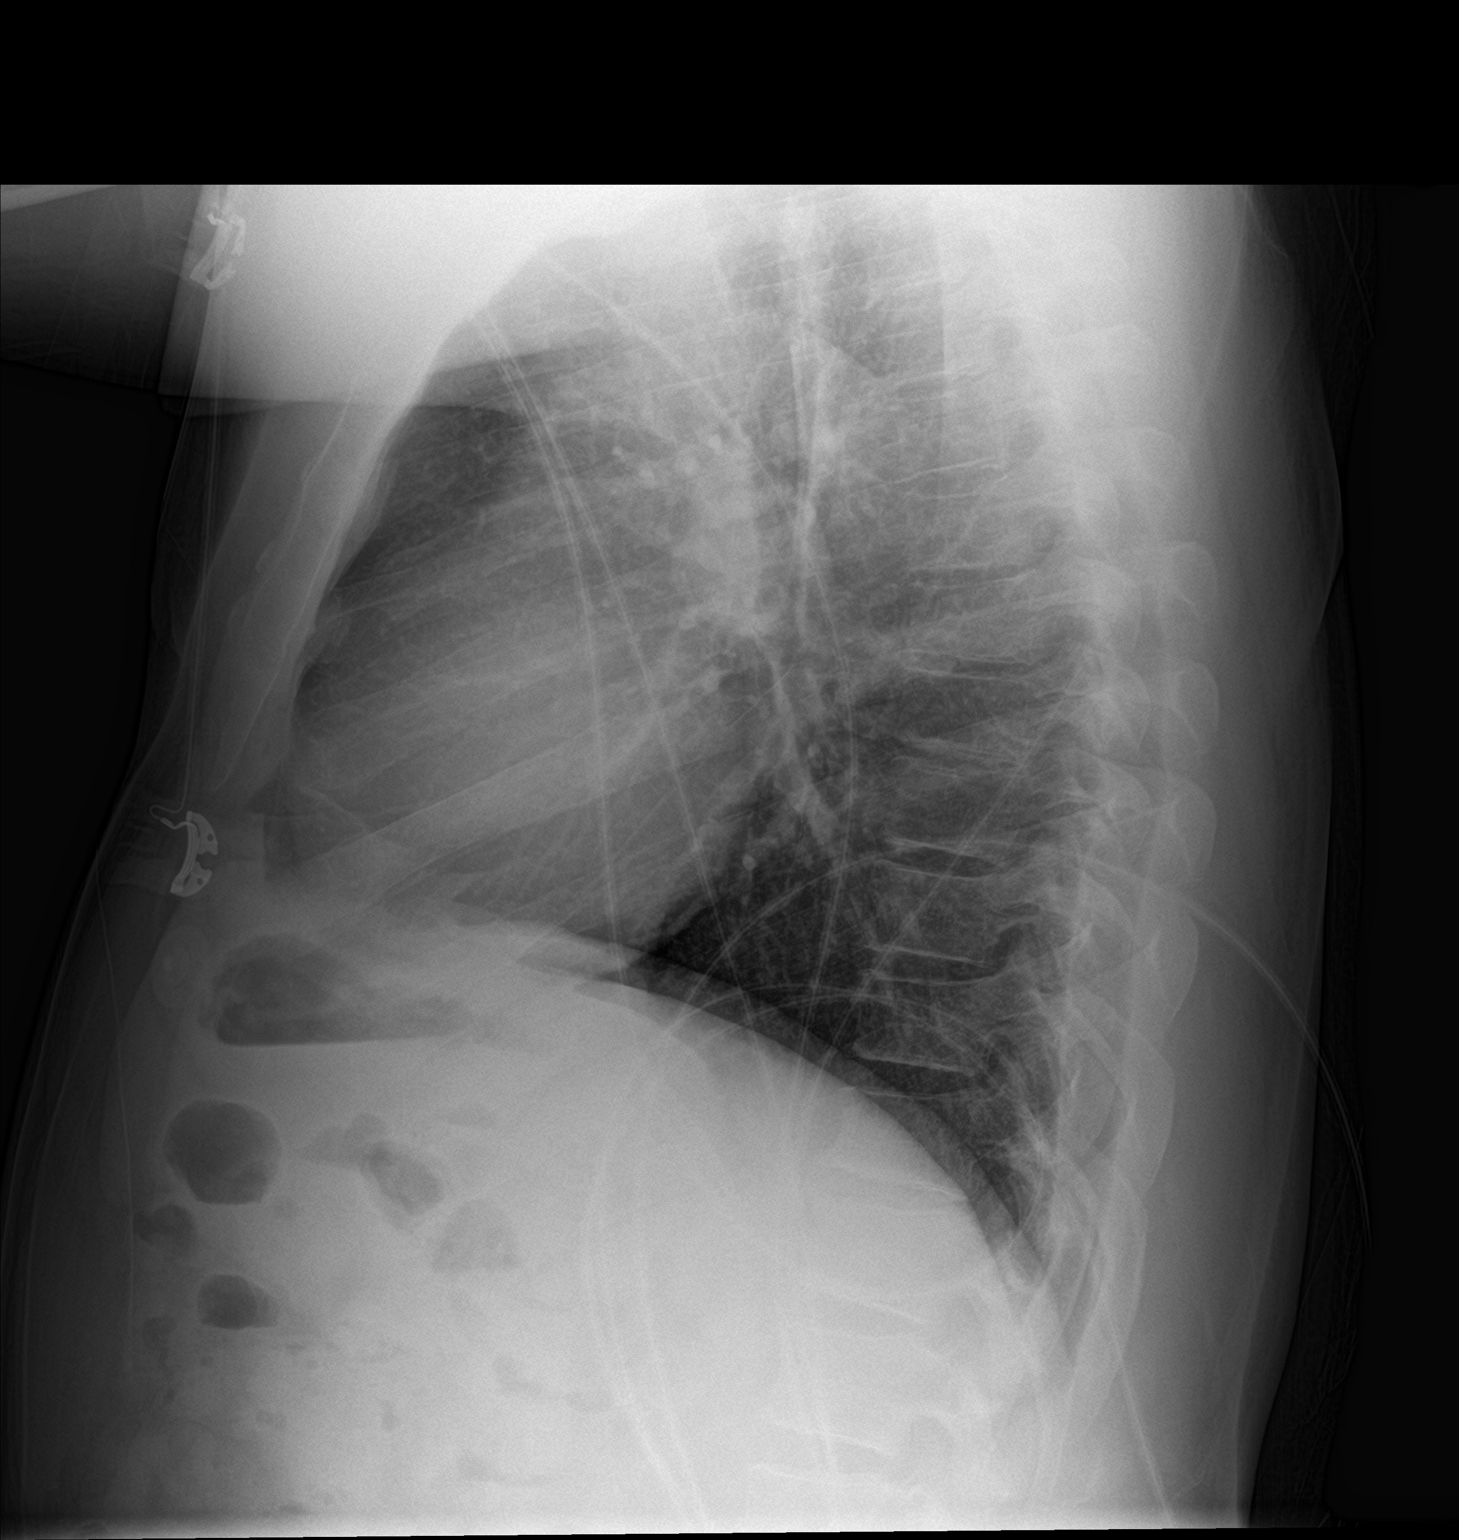

[2 of 2 positions shown; findings below may reference images not displayed]

FINDINGS: The heart size and mediastinal contours are within normal limits.
Both lungs are clear. The visualized skeletal structures are
unremarkable.
IMPRESSION: No active cardiopulmonary disease.

## 2020-10-13 MED ORDER — ACETAMINOPHEN 500 MG PO TABS: 1000.0000 mg | ORAL_TABLET | Freq: Four times a day (QID) | ORAL | 0 refills | Status: DC | PRN

## 2020-10-13 MED ORDER — METHOCARBAMOL 500 MG PO TABS: 500.0000 mg | ORAL_TABLET | Freq: Four times a day (QID) | ORAL | 0 refills | Status: DC | PRN

## 2020-10-13 NOTE — ED Triage Notes (Signed)
Pt reports L sided chest pain since this morning. Pt denies n/v and shortness of breath. Pt reports breaking out into a sweat when the pain started. Pt states he had a heart attack in 2019 and this feels similar.

## 2020-10-13 NOTE — ED Provider Notes (Signed)
[facility] EMERGENCY DEPARTMENT Provider Note   CSN: [id number] Arrival date & time: 10/13/20  1502     History Chief Complaint  Patient presents with  . Chest Pain    Matthew Fisher is a 50 y.o. male.  HPI Patient has significant cardiac history.  He reports he has been feeling well recently.  He was working on a floor this morning with a friend.  He was on his hands and knees helping placed tile and spread glue.  He reports that he suddenly got really sharp and uncomfortable pain in his left chest.  He indicates to the paraspinous area on the left.  It is worse if he sits forward or moves in certain directions.  However, he reports it does not really feel worse if he just presses on the area.  When it happened he became sweaty.  However he was not short of breath or nauseated.  He did not vomit.  He reports he is concerned because he did have a heart attack in 2019 and the pain felt somewhat similar.  The pain has been constant since its onset this morning.  He has not been experience any lower extremity pain or swelling.  No recent fever chills or cough.    Past Medical History:  Diagnosis Date  . Arthritis   . CAD (coronary artery disease)    a. NSTEMI 10/2017 s/p multivessel PCI of the OM1, PDA, PLA ostium and mid PLA.  . CKD (chronic kidney disease), stage II   . Gout   . Hypertension   . Insulin dependent diabetes mellitus     Patient Active Problem List   Diagnosis Date Noted  . CHF (congestive heart failure) ([employer]) 09/11/2019  . Suspected congestive heart failure 09/10/2019  . Nonadherence to medical treatment   . Tobacco abuse disorder   . CAD (coronary artery disease) 10/18/2017  . CKD (chronic kidney disease), stage II 10/18/2017  . NSTEMI (non-ST elevated myocardial infarction) ([place]) 10/15/2017  . Insulin dependent diabetes mellitus 10/15/2017  . Hypertension 10/15/2017  . Gastroparesis due to secondary diabetes ([place]) 10/15/2017  . Hypertensive  emergency, no CHF   . Medial meniscus tear 01/22/2013    Past Surgical History:  Procedure Laterality Date  . CORONARY STENT INTERVENTION N/A 10/17/2017   Procedure: CORONARY STENT INTERVENTION;  Surgeon: Jettie Booze, MD;  Location: [facility];  Service: Cardiovascular;  Laterality: N/A;  . FRACTURE SURGERY     right ring finger  . KNEE ARTHROSCOPY WITH LATERAL MENISECTOMY Left 01/22/2013   Procedure: LEFT KNEE ARTHROSCOPY DEBRIDEMENT CHONDROPLASTY, LATERAL RELEASE AND partial medial meniscectomy;  Surgeon: Johnn Hai, MD;  Location: [facility];  Service: Orthopedics;  Laterality: Left;  . KNEE SURGERY     "removed bone"  . LEFT HEART CATH N/A 10/17/2017   Procedure: Left Heart Cath;  Surgeon: Jettie Booze, MD;  Location: [facility];  Service: Cardiovascular;  Laterality: N/A;  . LEFT HEART CATH AND CORONARY ANGIOGRAPHY N/A 10/15/2017   Procedure: LEFT HEART CATH AND CORONARY ANGIOGRAPHY;  Surgeon: Leonie Man, MD;  Location: [facility];  Service: Cardiovascular;  Laterality: N/A;  . SHOULDER SURGERY         Family History  Problem Relation Age of Onset  . Diabetes Mother   . Cancer Father     Social History   Tobacco Use  . Smoking status: Current Some Day Smoker    Packs/day: 0.50    Years: 20.00  Pack years: 10.00    Types: Cigarettes, Cigars    Last attempt to quit: 10/08/2002    Years since quitting: 18.0  . Smokeless tobacco: Never Used  Vaping Use  . Vaping Use: Never used  Substance Use Topics  . Alcohol use: Yes    Comment: occasional  . Drug use: No    Home Medications Prior to Admission medications   Medication Sig Start Date End Date Taking? Authorizing Provider  acetaminophen (TYLENOL) 500 MG tablet Take 2 tablets (1,000 mg total) by mouth every 6 (six) hours as needed. 10/13/20  Yes Arby Barrette, MD  amLODipine (NORVASC) 10 MG tablet Take 1 tablet (10 mg total) by mouth daily. 10/28/17  Yes Jodelle Gross, NP  aspirin 81 MG chewable tablet Chew 1 tablet (81 mg total) by mouth daily. 10/20/17  Yes Dunn, Raymon Mutton, PA-C  atorvastatin (LIPITOR) 80 MG tablet Take 1 tablet (80 mg total) by mouth daily at 6 PM. 10/28/17  Yes Jodelle Gross, NP  carvedilol (COREG) 25 MG tablet Take 1 tablet (25 mg total) by mouth 2 (two) times daily with a meal. 10/28/17  Yes Jodelle Gross, NP  clopidogrel (PLAVIX) 75 MG tablet Take 1 tablet (75 mg total) by mouth daily. 10/28/17  Yes Jodelle Gross, NP  methocarbamol (ROBAXIN) 500 MG tablet Take 1 tablet (500 mg total) by mouth every 6 (six) hours as needed for muscle spasms. 10/13/20  Yes Arby Barrette, MD  acetaminophen (TYLENOL) 325 MG tablet Take 2 tablets (650 mg total) by mouth every 4 (four) hours as needed for headache or mild pain. 09/12/19   Swayze, Ava, DO  ferrous sulfate 325 (65 FE) MG tablet Take 325 mg by mouth every morning. 08/06/19   [provider]  furosemide (LASIX) 40 MG tablet Take 1 tablet (40 mg total) by mouth daily. 09/12/19 09/11/20  Swayze, Ava, DO  insulin detemir (LEVEMIR) 100 UNIT/ML injection Inject 20 Units into the skin daily.    [provider]  polyethylene glycol (MIRALAX) 17 g packet Take 17 g by mouth daily. Patient taking differently: Take 17 g by mouth daily as needed for mild constipation.  07/04/19   Palumbo, April, MD  potassium chloride 20 MEQ TBCR Take 20 mEq by mouth daily. 09/12/19   Swayze, Ava, DO  PROTONIX 40 MG tablet Take 40 mg by mouth 2 (two) times daily. 10/10/17   [provider]  Vitamin D, Ergocalciferol, (DRISDOL) 1.25 MG (50000 UT) CAPS capsule Take by mouth every 7 (seven) days.  11/27/18   [provider]    Allergies    Patient has no known allergies.  Review of Systems   Review of Systems 10 systems reviewed and negative except as per HPI Physical Exam Updated Vital Signs BP (!) 161/96 (BP Location: Right Arm)   Pulse 85   Temp 98.5 F (36.9 C) (Oral)   Resp  20   Ht 5\' 8"  (1.727 m)   Wt 95.3 kg   SpO2 98%   BMI 31.93 kg/m   Physical Exam Constitutional:      Appearance: He is well-developed and well-nourished.  HENT:     Head: Normocephalic and atraumatic.  Eyes:     Extraocular Movements: Extraocular movements intact and EOM normal.  Cardiovascular:     Rate and Rhythm: Normal rate and regular rhythm.     Pulses: Intact distal pulses.     Heart sounds: Normal heart sounds.  Pulmonary:  Effort: Pulmonary effort is normal.     Breath sounds: Normal breath sounds.     Comments: Chest wall pain is reproducible by sitting forward and twisting from side to side.  Patient gets sharp and reproducible pain.  Pain is not significantly reproducible to palpation. Chest:     Chest wall: Tenderness present.  Abdominal:     General: Bowel sounds are normal. There is no distension.     Palpations: Abdomen is soft.     Tenderness: There is no abdominal tenderness.  Musculoskeletal:        General: No swelling, tenderness or edema. Normal range of motion.     Cervical back: Neck supple.     Right lower leg: No edema.     Left lower leg: No edema.  Skin:    General: Skin is warm, dry and intact.  Neurological:     Mental Status: He is alert and oriented to person, place, and time.     GCS: GCS eye subscore is 4. GCS verbal subscore is 5. GCS motor subscore is 6.     Coordination: Coordination normal.     Deep Tendon Reflexes: Strength normal.  Psychiatric:        Mood and Affect: Mood and affect normal.     ED Results / Procedures / Treatments   Labs (all labs ordered are listed, but only abnormal results are displayed) Labs Reviewed  BASIC METABOLIC PANEL - Abnormal; Notable for the following components:      Result Value   Sodium 134 (*)    CO2 20 (*)    Glucose, Bld 165 (*)    BUN 31 (*)    Creatinine, Ser 2.16 (*)    GFR, Estimated 36 (*)    All other components within normal limits  CBC - Abnormal; Notable for the  following components:   RBC 3.46 (*)    Hemoglobin 11.0 (*)    HCT 30.0 (*)    MCHC 36.7 (*)    All other components within normal limits  TROPONIN I (HIGH SENSITIVITY)  TROPONIN I (HIGH SENSITIVITY)    EKG EKG Interpretation  Date/Time:  Thursday October 13 2020 15:04:50 EST Ventricular Rate:  80 PR Interval:  166 QRS Duration: 88 QT Interval:  358 QTC Calculation: 412 R Axis:   54 Text Interpretation: Normal sinus rhythm Nonspecific T wave abnormality Abnormal ECG no sig change from previous Confirmed by Arby Barrette [phone]) on 10/13/2020 4:09:17 PM   Radiology DG Chest 2 View  Result Date: 10/13/2020 CLINICAL DATA:  Chest pain EXAM: CHEST - 2 VIEW COMPARISON:  09/10/2019 FINDINGS: The heart size and mediastinal contours are within normal limits. Both lungs are clear. The visualized skeletal structures are unremarkable. IMPRESSION: No active cardiopulmonary disease. Electronically Signed   By: Katherine Mantle M.D.   On: 10/13/2020 17:50    Procedures Procedures (including critical care time)  Medications Ordered in ED Medications - No data to display  ED Course  I have reviewed the triage vital signs and the nursing notes.  Pertinent labs & imaging results that were available during my care of the patient were reviewed by me and considered in my medical decision making (see chart for details).    MDM Rules/Calculators/A&P                         Patient presents as outlined.  He does have cardiac history.  Today he was working on a hands and  knee position on the floor doing tiling and lying out glue.  He suddenly got a sharp pain in his chest.  This is reproducible with certain twisting and movements.  2 sets of cardiac enzymes are negative.  EKG is unchanged from previous tracing.  He has not been experiencing any exertional chest pain leading up to this.  Patient is otherwise clinically well.  Stable for discharge.  Return precautions reviewed.  He will use  acetaminophen and Robaxin for pain control.  Patient has baseline renal insufficiency.  Advised he cannot use NSAIDs. Final Clinical Impression(s) / ED Diagnoses Final diagnoses:  Atypical chest pain  Chest wall pain    Rx / DC Orders ED Discharge Orders         Ordered    acetaminophen (TYLENOL) 500 MG tablet  Every 6 hours PRN        10/13/20 1931    methocarbamol (ROBAXIN) 500 MG tablet  Every 6 hours PRN        10/13/20 1931           Arby Barrette, MD 10/13/20 1937

## 2020-10-13 NOTE — Discharge Instructions (Signed)
1.  At this time your pain appears to be due to pain in the chest wall where the ribs meet the sternum.  Take acetaminophen every 6 hours as needed for pain.  You may also take Robaxin as prescribed as a muscle relaxer. 2.  Schedule follow-up with your doctor within the next 3 to 4 days. 3.  Return to the emergency department if you have new additional or worsening symptoms.

## 2020-11-11 ENCOUNTER — Encounter (HOSPITAL_BASED_OUTPATIENT_CLINIC_OR_DEPARTMENT_OTHER): Payer: Self-pay | Admitting: *Deleted

## 2020-11-11 ENCOUNTER — Emergency Department (HOSPITAL_BASED_OUTPATIENT_CLINIC_OR_DEPARTMENT_OTHER): Payer: Self-pay

## 2020-11-11 ENCOUNTER — Emergency Department (HOSPITAL_BASED_OUTPATIENT_CLINIC_OR_DEPARTMENT_OTHER)
Admission: EM | Admit: 2020-11-11 | Discharge: 2020-11-11 | Disposition: A | Discharge status: Home / Self Care | Payer: Self-pay | Attending: Emergency Medicine | Admitting: Emergency Medicine

## 2020-11-11 ENCOUNTER — Other Ambulatory Visit: Payer: Self-pay

## 2020-11-11 DIAGNOSIS — E1022 Type 1 diabetes mellitus with diabetic chronic kidney disease: Secondary | ICD-10-CM | POA: Insufficient documentation

## 2020-11-11 DIAGNOSIS — I509 Heart failure, unspecified: Secondary | ICD-10-CM | POA: Insufficient documentation

## 2020-11-11 DIAGNOSIS — N182 Chronic kidney disease, stage 2 (mild): Secondary | ICD-10-CM | POA: Insufficient documentation

## 2020-11-11 DIAGNOSIS — M79605 Pain in left leg: Secondary | ICD-10-CM | POA: Insufficient documentation

## 2020-11-11 DIAGNOSIS — I2510 Atherosclerotic heart disease of native coronary artery without angina pectoris: Secondary | ICD-10-CM | POA: Insufficient documentation

## 2020-11-11 DIAGNOSIS — M79652 Pain in left thigh: Secondary | ICD-10-CM | POA: Insufficient documentation

## 2020-11-11 DIAGNOSIS — I130 Hypertensive heart and chronic kidney disease with heart failure and stage 1 through stage 4 chronic kidney disease, or unspecified chronic kidney disease: Secondary | ICD-10-CM | POA: Insufficient documentation

## 2020-11-11 DIAGNOSIS — F17210 Nicotine dependence, cigarettes, uncomplicated: Secondary | ICD-10-CM | POA: Insufficient documentation

## 2020-11-11 IMAGING — US US EXTREM LOW VENOUS*L*
1 series · 14 of 24 positions shown · non-contrast
Comparison: None.

CLINICAL DATA: Left leg pain

EXAM:
LEFT LOWER EXTREMITY VENOUS DOPPLER ULTRASOUND
TECHNIQUE: Gray-scale sonography with compression, as well as color and duplex
ultrasound, were performed to evaluate the deep venous system(s)
from the level of the common femoral vein through the popliteal and
proximal calf veins.

[Series 1: us extrem low venous*left* · 14 of 42 slices shown]
[im 1/42]
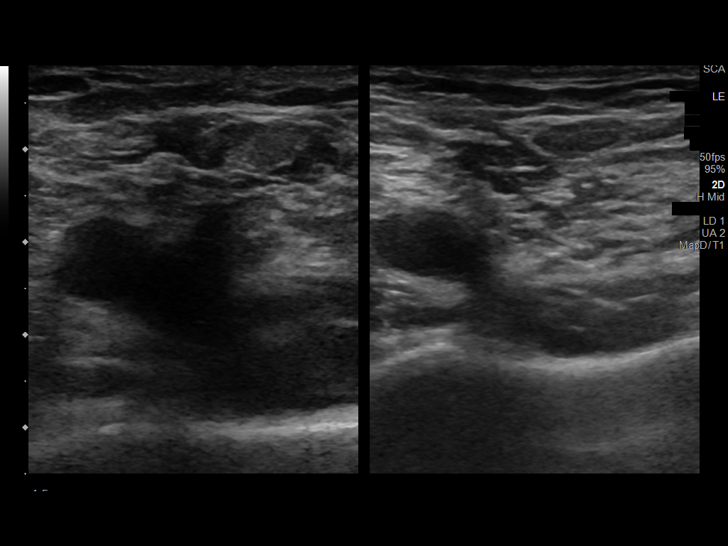
[im 4/42]
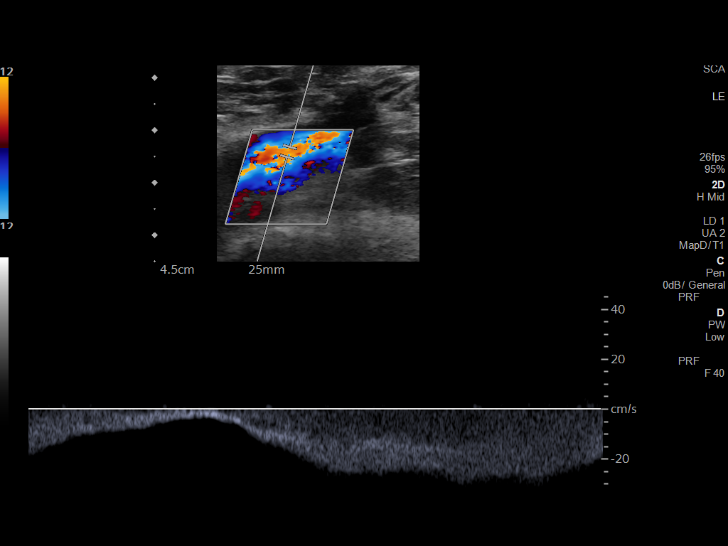
[im 8/42]
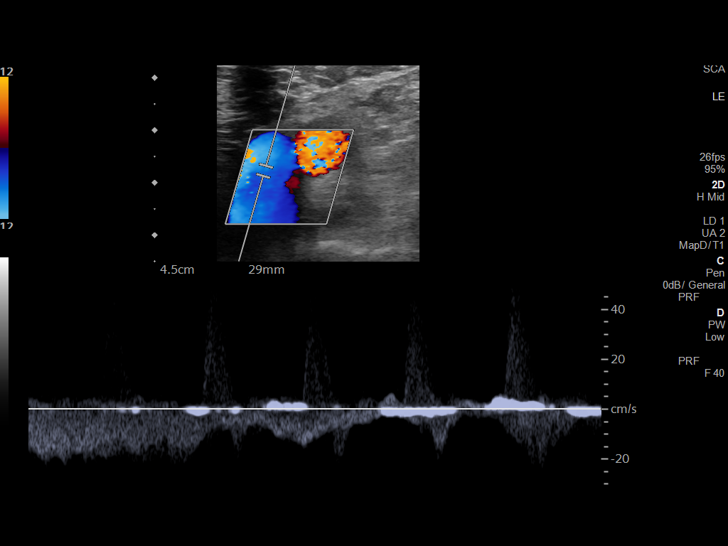
[im 11/42]
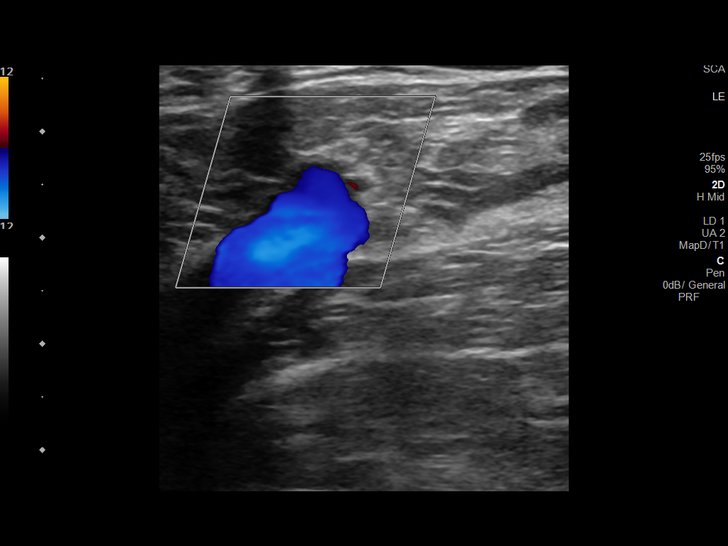
[im 13/42]
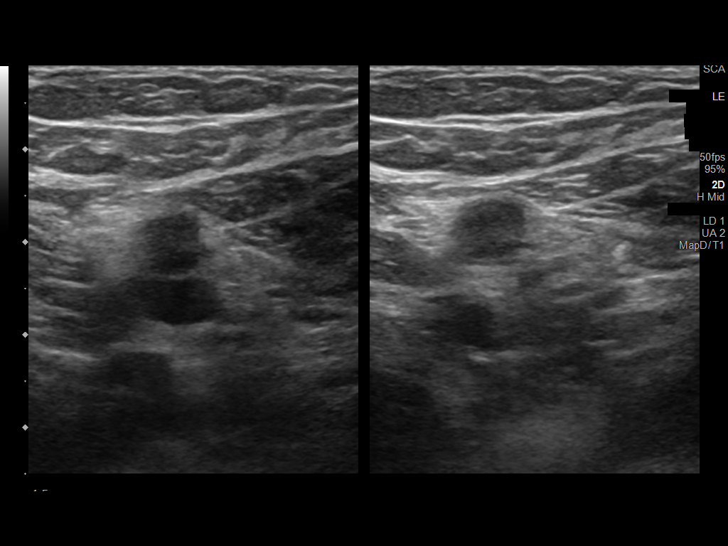
[im 17/42]
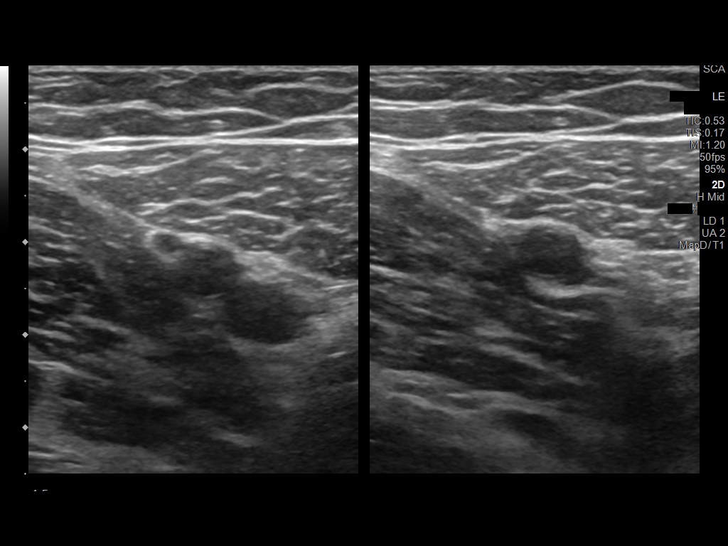
[im 20/42]
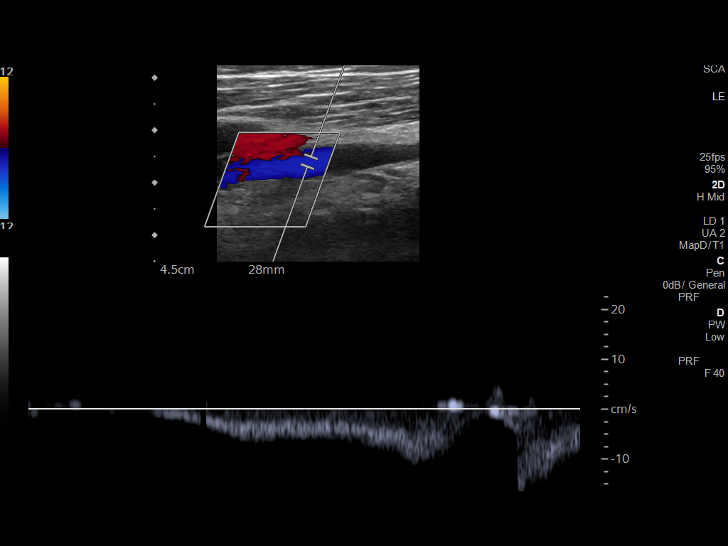
[im 22/42]
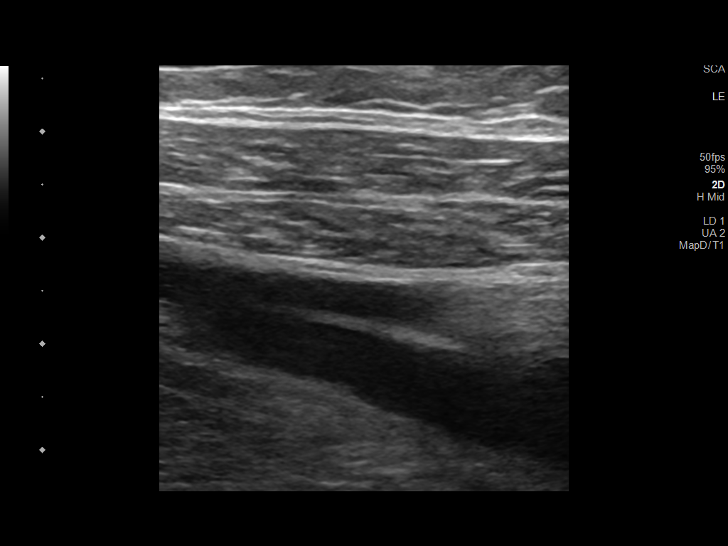
[im 25/42]
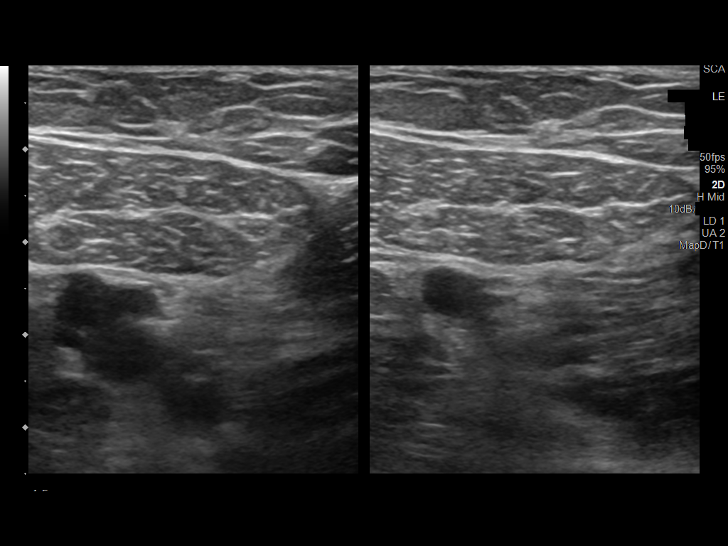
[im 29/42]
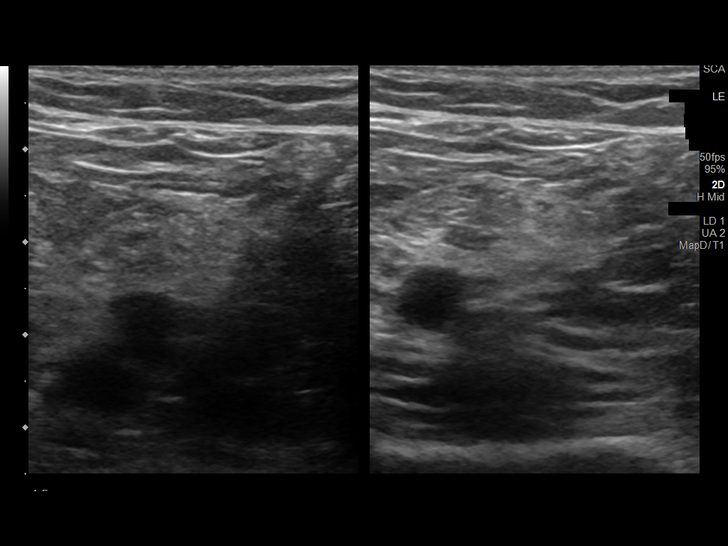
[im 33/42]
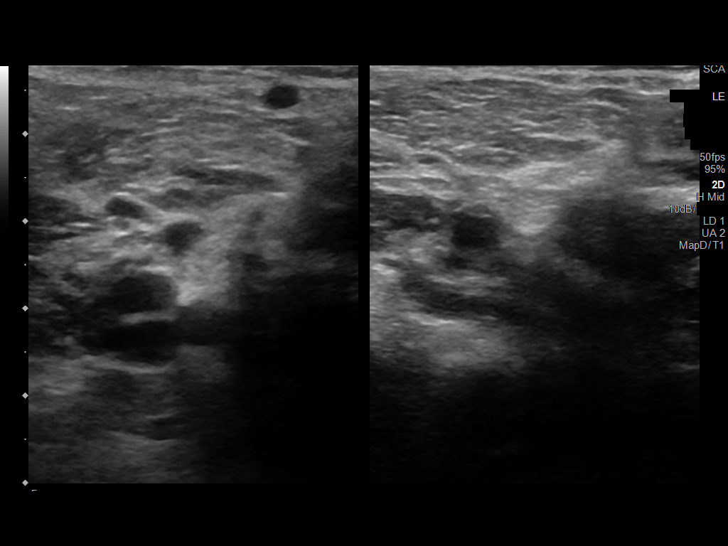
[im 34/42]
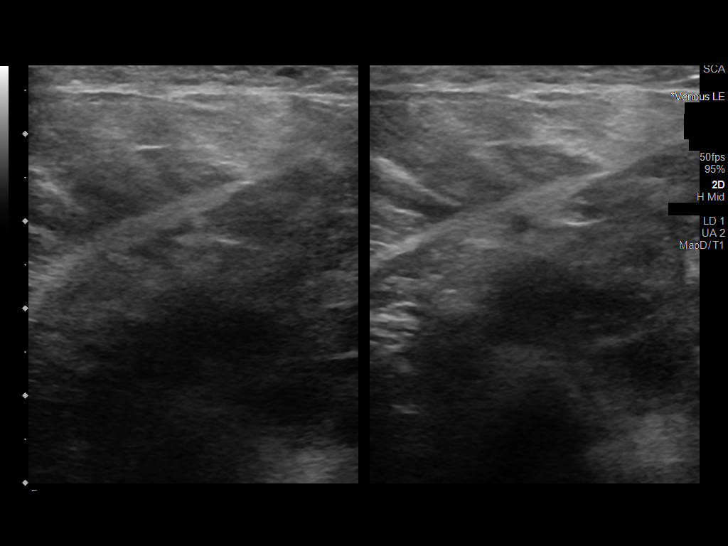
[im 38/42]
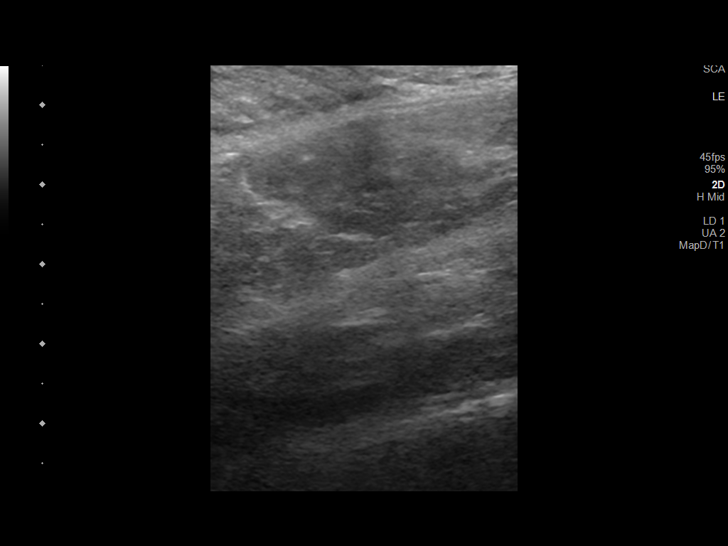
[im 42/42]
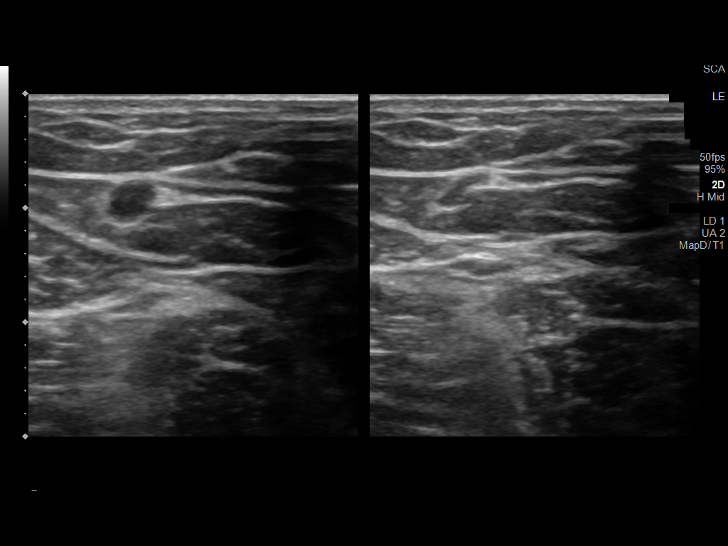

[14 of 24 positions shown; findings below may reference images not displayed]

FINDINGS: VENOUS

Normal compressibility of the common femoral, superficial femoral,
and popliteal veins, as well as the visualized calf veins.
Visualized portions of profunda femoral vein and great saphenous
vein unremarkable. No filling defects to suggest DVT on grayscale or
color Doppler imaging. Doppler waveforms show normal direction of
venous flow, normal respiratory plasticity and response to
augmentation.

Limited views of the contralateral common femoral vein are
unremarkable.

OTHER

None.

Limitations: none
IMPRESSION: No evidence of femoropopliteal DVT within the left lower extremity.

## 2020-11-11 MED ORDER — DICLOFENAC SODIUM 1 % EX GEL: 2.0000 g | Freq: Four times a day (QID) | CUTANEOUS | 0 refills | Status: DC | PRN

## 2020-11-11 NOTE — ED Provider Notes (Signed)
Emergency Department Provider Note   I have reviewed the triage vital signs and the nursing notes.   HISTORY  Chief Complaint Leg Pain   HPI Matthew Fisher is a 50 y.o. male with past medical history reviewed below presents to the emergency department with atraumatic pain to the left leg.  He describes pain mainly medial to the left thigh and radiating anteriorly.  He states when he is sitting with his legs over the edge of a chair or bed he does have some pain posteriorly.  He denies any chest pain or shortness of breath.  No pain behind the calf.    No prior history of DVT.  Patient states he is especially aware of DVTs as he had a friend die when she had a DVT go undiagnosed which led to PEA and sudden cardiac arrest.    Past Medical History:  Diagnosis Date  . Arthritis   . CAD (coronary artery disease)    a. NSTEMI 10/2017 s/p multivessel PCI of the OM1, PDA, PLA ostium and mid PLA.  . CKD (chronic kidney disease), stage II   . Gout   . Hypertension   . Insulin dependent diabetes mellitus     Patient Active Problem List   Diagnosis Date Noted  . CHF (congestive heart failure) ([facility]) 09/11/2019  . Suspected congestive heart failure 09/10/2019  . Nonadherence to medical treatment   . Tobacco abuse disorder   . CAD (coronary artery disease) 10/18/2017  . CKD (chronic kidney disease), stage II 10/18/2017  . NSTEMI (non-ST elevated myocardial infarction) ([facility]) 10/15/2017  . Insulin dependent diabetes mellitus 10/15/2017  . Hypertension 10/15/2017  . Gastroparesis due to secondary diabetes ([facility]) 10/15/2017  . Hypertensive emergency, no CHF   . Medial meniscus tear 01/22/2013    Past Surgical History:  Procedure Laterality Date  . CORONARY STENT INTERVENTION N/A 10/17/2017   Procedure: CORONARY STENT INTERVENTION;  Surgeon: Corky Crafts, MD;  Location: [facility] INVASIVE [facility];  Service: Cardiovascular;  Laterality: N/A;  . FRACTURE SURGERY     right ring finger  .  KNEE ARTHROSCOPY WITH LATERAL MENISECTOMY Left 01/22/2013   Procedure: LEFT KNEE ARTHROSCOPY DEBRIDEMENT CHONDROPLASTY, LATERAL RELEASE AND partial medial meniscectomy;  Surgeon: Javier Docker, MD;  Location: [facility];  Service: Orthopedics;  Laterality: Left;  . KNEE SURGERY     "removed bone"  . LEFT HEART CATH N/A 10/17/2017   Procedure: Left Heart Cath;  Surgeon: Corky Crafts, MD;  Location: [facility];  Service: Cardiovascular;  Laterality: N/A;  . LEFT HEART CATH AND CORONARY ANGIOGRAPHY N/A 10/15/2017   Procedure: LEFT HEART CATH AND CORONARY ANGIOGRAPHY;  Surgeon: Marykay Lex, MD;  Location: [facility];  Service: Cardiovascular;  Laterality: N/A;  . SHOULDER SURGERY      Allergies Patient has no known allergies.  Family History  Problem Relation Age of Onset  . Diabetes Mother   . Cancer Father     Social History Social History   Tobacco Use  . Smoking status: Current Some Day Smoker    Packs/day: 0.50    Years: 20.00    Pack years: 10.00    Types: Cigarettes, Cigars    Last attempt to quit: 10/08/2002    Years since quitting: 18.1  . Smokeless tobacco: Never Used  Vaping Use  . Vaping Use: Never used  Substance Use Topics  . Alcohol use: Yes    Comment: occasional  . Drug use: No  Review of Systems  Constitutional: No fever/chills Eyes: No visual changes. ENT: No sore throat. Cardiovascular: Denies chest pain. Respiratory: Denies shortness of breath. Gastrointestinal: No abdominal pain.  No nausea, no vomiting.  No diarrhea.  No constipation. Genitourinary: Negative for dysuria. Musculoskeletal: Negative for back pain. Positive left thigh pain.  Skin: Negative for rash. Neurological: Negative for headaches, focal weakness or numbness.  10-point ROS otherwise negative.  ____________________________________________   PHYSICAL EXAM:  VITAL SIGNS: ED Triage Vitals  Enc Vitals Group     BP 11/11/20 1137 130/80     Pulse Rate  11/11/20 1137 82     Resp 11/11/20 1137 20     Temp 11/11/20 1137 97.9 F (36.6 C)     Temp Source 11/11/20 1137 Oral     SpO2 11/11/20 1137 100 %     Weight 11/11/20 1133 213 lb 11.2 oz (96.9 kg)     Height 11/11/20 1133 5\' 8"  (1.727 m)   Constitutional: Alert and oriented. Well appearing and in no acute distress. Eyes: Conjunctivae are normal. Head: Atraumatic. Nose: No congestion/rhinnorhea. Mouth/Throat: Mucous membranes are moist. Neck: No stridor.   Cardiovascular: Good peripheral circulation.  Respiratory: Normal respiratory effort.  Gastrointestinal: No distention.  Musculoskeletal: No deformity, bony tenderness, skin changes in the left thigh, knee, lower extremity.  Normal range of motion of the hip, knee, ankle. Neurologic:  Normal speech and language. Normal sensation in the LLE.  Skin:  Skin is warm, dry and intact. No rash noted.  ____________________________________________  RADIOLOGY  Venous Img Lower  Left (DVT Study)  Result Date: 11/11/2020 CLINICAL DATA:  Left leg pain EXAM: LEFT LOWER EXTREMITY VENOUS DOPPLER ULTRASOUND TECHNIQUE: Gray-scale sonography with compression, as well as color and duplex ultrasound, were performed to evaluate the deep venous system(s) from the level of the common femoral vein through the popliteal and proximal calf veins. COMPARISON:  None. FINDINGS: VENOUS Normal compressibility of the common femoral, superficial femoral, and popliteal veins, as well as the visualized calf veins. Visualized portions of profunda femoral vein and great saphenous vein unremarkable. No filling defects to suggest DVT on grayscale or color Doppler imaging. Doppler waveforms show normal direction of venous flow, normal respiratory plasticity and response to augmentation. Limited views of the contralateral common femoral vein are unremarkable. OTHER None. Limitations: none IMPRESSION: No evidence of femoropopliteal DVT within the left lower extremity.  Electronically Signed   By: 01/09/2021 MD   On: 11/11/2020 13:05    ____________________________________________   PROCEDURES  Procedure(s) performed:   Procedures  None  ____________________________________________   INITIAL IMPRESSION / ASSESSMENT AND PLAN / ED COURSE  Pertinent labs & imaging results that were available during my care of the patient were reviewed by me and considered in my medical decision making (see chart for details).   Patient presents to the emergency department with pain to the left lower extremity without trauma.  No appreciable swelling.  Good pulses and sensation in the leg.  No neuro deficits to suspect sciatica.  No hard signs of vascular compromise or compartment syndrome.  Plan for DVT ultrasound of the left lower extremity.  Patient not having chest pain or shortness of breath to suspect PE. Vitals are reassuring.   DVT ultrasound negative for clot.  No overlying cellulitis.  No vascular compromise.  No bony tenderness to suspect underlying bony lesion or fracture.  Plan for Voltaren gel for possible MSK etiology and PCP follow-up plan. ____________________________________________  FINAL CLINICAL IMPRESSION(S) / ED  DIAGNOSES  Final diagnoses:  Left leg pain    NEW OUTPATIENT MEDICATIONS STARTED DURING THIS VISIT:  Discharge Medication List as of 11/11/2020  1:32 PM    START taking these medications   Details  diclofenac Sodium (VOLTAREN) 1 % GEL Apply 2 g topically 4 (four) times daily as needed (leg pain)., Starting Fri 11/11/2020, Normal        Note:  This document was prepared using Dragon voice recognition software and may include unintentional dictation errors.  Alona Bene, MD, [facility] Emergency [facility]    Jareth Pardee, Arlyss Repress, MD 11/11/20 [phone]

## 2020-11-11 NOTE — ED Notes (Signed)
Pt awaiting transport to US

## 2020-11-11 NOTE — Discharge Instructions (Signed)
You were seen in the emerge department today with leg pain.  Your ultrasound did not show a blood clot in your leg.  Please use the medication called to the pharmacy as needed for pain along with Tylenol.  If you do not have a primary care doctor I have listed on the paperwork but you can call to establish care.  Please return to the emergency department with worsening pain, color changes to the leg, chest pain, shortness of breath.

## 2020-11-11 NOTE — ED Triage Notes (Signed)
Left upper leg pain since yesterday. Pt denies injury. He wants to r/o DVT.

## 2020-12-11 ENCOUNTER — Other Ambulatory Visit: Payer: Self-pay

## 2020-12-11 ENCOUNTER — Encounter (HOSPITAL_BASED_OUTPATIENT_CLINIC_OR_DEPARTMENT_OTHER): Payer: Self-pay | Admitting: Emergency Medicine

## 2020-12-11 ENCOUNTER — Emergency Department (HOSPITAL_BASED_OUTPATIENT_CLINIC_OR_DEPARTMENT_OTHER)
Admission: EM | Admit: 2020-12-11 | Discharge: 2020-12-11 | Disposition: A | Discharge status: Home / Self Care | Payer: Self-pay | Attending: Emergency Medicine | Admitting: Emergency Medicine

## 2020-12-11 DIAGNOSIS — N182 Chronic kidney disease, stage 2 (mild): Secondary | ICD-10-CM | POA: Insufficient documentation

## 2020-12-11 DIAGNOSIS — L03012 Cellulitis of left finger: Secondary | ICD-10-CM

## 2020-12-11 DIAGNOSIS — Z7902 Long term (current) use of antithrombotics/antiplatelets: Secondary | ICD-10-CM | POA: Insufficient documentation

## 2020-12-11 DIAGNOSIS — I509 Heart failure, unspecified: Secondary | ICD-10-CM | POA: Insufficient documentation

## 2020-12-11 DIAGNOSIS — E1122 Type 2 diabetes mellitus with diabetic chronic kidney disease: Secondary | ICD-10-CM | POA: Insufficient documentation

## 2020-12-11 DIAGNOSIS — I2510 Atherosclerotic heart disease of native coronary artery without angina pectoris: Secondary | ICD-10-CM | POA: Insufficient documentation

## 2020-12-11 DIAGNOSIS — Z794 Long term (current) use of insulin: Secondary | ICD-10-CM | POA: Insufficient documentation

## 2020-12-11 DIAGNOSIS — I130 Hypertensive heart and chronic kidney disease with heart failure and stage 1 through stage 4 chronic kidney disease, or unspecified chronic kidney disease: Secondary | ICD-10-CM | POA: Insufficient documentation

## 2020-12-11 DIAGNOSIS — Z7982 Long term (current) use of aspirin: Secondary | ICD-10-CM | POA: Insufficient documentation

## 2020-12-11 DIAGNOSIS — F17210 Nicotine dependence, cigarettes, uncomplicated: Secondary | ICD-10-CM | POA: Insufficient documentation

## 2020-12-11 DIAGNOSIS — Z79899 Other long term (current) drug therapy: Secondary | ICD-10-CM | POA: Insufficient documentation

## 2020-12-11 MED ORDER — SULFAMETHOXAZOLE-TRIMETHOPRIM 800-160 MG PO TABS: 1.0000 | ORAL_TABLET | Freq: Two times a day (BID) | ORAL | 0 refills | Status: AC

## 2020-12-11 MED ORDER — BUPIVACAINE HCL (PF) 0.5 % IJ SOLN
10.0000 mL | Freq: Once | INTRAMUSCULAR | Status: AC
  Administered 2020-12-11: 10 mL
  Filled 2020-12-11: qty 10

## 2020-12-11 NOTE — ED Provider Notes (Signed)
[facility] EMERGENCY DEPARTMENT Provider Note   CSN: [id number] Arrival date & time: 12/11/20  7412     History Chief Complaint  Patient presents with  . Hand Pain    thumb    Matthew Fisher is a 50 y.o. male.  HPI Patient presents with left thumb pain.  Has had for around a week.  States began after crack in the nail.  Now more pain and swelling.  Some erythema.  Feels fine besides the thumb.  No fevers or chills.  No drainage.  Has not had symptoms like this before.    Past Medical History:  Diagnosis Date  . Arthritis   . CAD (coronary artery disease)    a. NSTEMI 10/2017 s/p multivessel PCI of the OM1, PDA, PLA ostium and mid PLA.  . CKD (chronic kidney disease), stage II   . Gout   . Hypertension   . Insulin dependent diabetes mellitus     Patient Active Problem List   Diagnosis Date Noted  . CHF (congestive heart failure) (HCC) 09/11/2019  . Suspected congestive heart failure 09/10/2019  . Nonadherence to medical treatment   . Tobacco abuse disorder   . CAD (coronary artery disease) 10/18/2017  . CKD (chronic kidney disease), stage II 10/18/2017  . NSTEMI (non-ST elevated myocardial infarction) (HCC) 10/15/2017  . Insulin dependent diabetes mellitus 10/15/2017  . Hypertension 10/15/2017  . Gastroparesis due to secondary diabetes (HCC) 10/15/2017  . Hypertensive emergency, no CHF   . Medial meniscus tear 01/22/2013    Past Surgical History:  Procedure Laterality Date  . CORONARY STENT INTERVENTION N/A 10/17/2017   Procedure: CORONARY STENT INTERVENTION;  Surgeon: Corky Crafts, MD;  Location: [facility] INVASIVE CV LAB;  Service: Cardiovascular;  Laterality: N/A;  . FRACTURE SURGERY     right ring finger  . KNEE ARTHROSCOPY WITH LATERAL MENISECTOMY Left 01/22/2013   Procedure: LEFT KNEE ARTHROSCOPY DEBRIDEMENT CHONDROPLASTY, LATERAL RELEASE AND partial medial meniscectomy;  Surgeon: Javier Docker, MD;  Location: [facility]S;  Service: Orthopedics;   Laterality: Left;  . KNEE SURGERY     "removed bone"  . LEFT HEART CATH N/A 10/17/2017   Procedure: Left Heart Cath;  Surgeon: Corky Crafts, MD;  Location: [facility] INVASIVE CV LAB;  Service: Cardiovascular;  Laterality: N/A;  . LEFT HEART CATH AND CORONARY ANGIOGRAPHY N/A 10/15/2017   Procedure: LEFT HEART CATH AND CORONARY ANGIOGRAPHY;  Surgeon: Marykay Lex, MD;  Location: [facility] INVASIVE CV LAB;  Service: Cardiovascular;  Laterality: N/A;  . SHOULDER SURGERY         Family History  Problem Relation Age of Onset  . Diabetes Mother   . Cancer Father     Social History   Tobacco Use  . Smoking status: Current Some Day Smoker    Packs/day: 0.50    Years: 20.00    Pack years: 10.00    Types: Cigarettes, Cigars    Last attempt to quit: 10/08/2002    Years since quitting: 18.1  . Smokeless tobacco: Never Used  Vaping Use  . Vaping Use: Never used  Substance Use Topics  . Alcohol use: Yes    Comment: occasional  . Drug use: No    Home Medications Prior to Admission medications   Medication Sig Start Date End Date Taking? Authorizing Provider  acetaminophen (TYLENOL) 325 MG tablet Take 2 tablets (650 mg total) by mouth every 4 (four) hours as needed for headache or mild pain. 09/12/19   Swayze, Ava, DO  acetaminophen (TYLENOL) 500 MG tablet Take 2 tablets (1,000 mg total) by mouth every 6 (six) hours as needed. 10/13/20   Arby Barrette, MD  amLODipine (NORVASC) 10 MG tablet Take 1 tablet (10 mg total) by mouth daily. 10/28/17   Jodelle Gross, NP  aspirin 81 MG chewable tablet Chew 1 tablet (81 mg total) by mouth daily. 10/20/17   Dunn, Raymon Mutton, PA-C  atorvastatin (LIPITOR) 80 MG tablet Take 1 tablet (80 mg total) by mouth daily at 6 PM. 10/28/17   Jodelle Gross, NP  carvedilol (COREG) 25 MG tablet Take 1 tablet (25 mg total) by mouth 2 (two) times daily with a meal. 10/28/17   Jodelle Gross, NP  clopidogrel (PLAVIX) 75 MG tablet Take 1 tablet (75 mg total) by mouth  daily. 10/28/17   Jodelle Gross, NP  diclofenac Sodium (VOLTAREN) 1 % GEL Apply 2 g topically 4 (four) times daily as needed (leg pain). 11/11/20   Long, Arlyss Repress, MD  ferrous sulfate 325 (65 FE) MG tablet Take 325 mg by mouth every morning. 08/06/19   [provider]  furosemide (LASIX) 40 MG tablet Take 1 tablet (40 mg total) by mouth daily. 09/12/19 09/11/20  Swayze, Ava, DO  insulin detemir (LEVEMIR) 100 UNIT/ML injection Inject 20 Units into the skin daily.    [provider]  methocarbamol (ROBAXIN) 500 MG tablet Take 1 tablet (500 mg total) by mouth every 6 (six) hours as needed for muscle spasms. 10/13/20   Arby Barrette, MD  polyethylene glycol (MIRALAX) 17 g packet Take 17 g by mouth daily. Patient taking differently: Take 17 g by mouth daily as needed for mild constipation. 07/04/19   Palumbo, April, MD  potassium chloride 20 MEQ TBCR Take 20 mEq by mouth daily. 09/12/19   Swayze, Ava, DO  PROTONIX 40 MG tablet Take 40 mg by mouth 2 (two) times daily. 10/10/17   [provider]  Vitamin D, Ergocalciferol, (DRISDOL) 1.25 MG (50000 UT) CAPS capsule Take by mouth every 7 (seven) days.  11/27/18   [provider]    Allergies    Patient has no known allergies.  Review of Systems   Review of Systems  Constitutional: Negative for appetite change and fever.  Respiratory: Positive for shortness of breath.   Gastrointestinal: Negative for abdominal pain.  Endocrine: Negative for polyuria.  Musculoskeletal:       Left thumb pain and swelling.  Skin: Positive for wound.  Neurological: Negative for weakness.    Physical Exam Updated Vital Signs BP (!) 156/80 (BP Location: Right Arm)   Pulse 90   Temp 98 F (36.7 C) (Oral)   Resp 16   SpO2 96%   Physical Exam Vitals and nursing note reviewed.  Constitutional:      Appearance: Normal appearance.  HENT:     Head: Normocephalic.  Eyes:     General: No scleral icterus. Cardiovascular:     Rate  and Rhythm: Normal rate.  Pulmonary:     Effort: No respiratory distress.  Abdominal:     Tenderness: There is no abdominal tenderness.  Musculoskeletal:     Comments: Pain swelling and fluctuance along base of nail/paronychia on left thumb more on the lateral aspect.  Good movement at the IP joint.  Nail intact.  Skin:    General: Skin is warm.     Capillary Refill: Capillary refill takes less than 2 seconds.  Neurological:     Mental Status: He is oriented  to person, place, and time.     ED Results / Procedures / Treatments   Labs (all labs ordered are listed, but only abnormal results are displayed) Labs Reviewed - No data to display  EKG None  Radiology No results found.  Procedures .Marland KitchenIncision and Drainage  Date/Time: 12/11/2020 9:55 AM Performed by: Benjiman Core, MD Authorized by: Benjiman Core, MD   Consent:    Consent obtained:  Verbal   Consent given by:  Patient   Risks discussed:  Bleeding, incomplete drainage and pain   Alternatives discussed:  No treatment, delayed treatment, alternative treatment and observation Universal protocol:    Procedure explained and questions answered to patient or proxy's satisfaction: yes   Location:    Type:  Abscess   Location:  Upper extremity   Upper extremity location:  Finger   Finger location:  L thumb Pre-procedure details:    Skin preparation:  Chlorhexidine Sedation:    Sedation type:  None Anesthesia:    Anesthesia method:  Nerve block   Block needle gauge:  27 G   Block anesthetic:  Bupivacaine 0.5% w/o epi   Block technique:  Digital block   Block injection procedure:  Anatomic landmarks identified and introduced needle   Block outcome:  Anesthesia achieved Procedure type:    Complexity:  Simple Procedure details:    Ultrasound guidance: no     Needle aspiration: no     Incision types:  Single straight   Wound management:  Probed and deloculated   Drainage:  Purulent   Drainage amount:   Moderate   Wound treatment:  Wound left open   Packing materials:  None Post-procedure details:    Procedure completion:  Tolerated well, no immediate complications     Medications Ordered in ED Medications  bupivacaine (MARCAINE) 0.5 % injection 10 mL (has no administration in time range)    ED Course  I have reviewed the triage vital signs and the nursing notes.  Pertinent labs & imaging results that were available during my care of the patient were reviewed by me and considered in my medical decision making (see chart for details).    MDM Rules/Calculators/A&P                          Patient with paronychia on left thumb.  Drained with mod amount of purulent drainage.  Did have some surrounding cellulitis.  Will treat with antibiotics.  Discharge home. Final Clinical Impression(s) / ED Diagnoses Final diagnoses:  None    Rx / DC Orders ED Discharge Orders    None       Benjiman Core, MD 12/12/20 1857

## 2020-12-11 NOTE — ED Triage Notes (Signed)
Pain and swelling to left thumb x 1 week. ?  Paronychia

## 2020-12-11 NOTE — ED Notes (Signed)
ED Provider at bedside. 

## 2020-12-15 ENCOUNTER — Emergency Department (HOSPITAL_BASED_OUTPATIENT_CLINIC_OR_DEPARTMENT_OTHER)
Admission: EM | Admit: 2020-12-15 | Discharge: 2020-12-15 | Disposition: A | Discharge status: Home / Self Care | Payer: Self-pay | Attending: Emergency Medicine | Admitting: Emergency Medicine

## 2020-12-15 ENCOUNTER — Encounter (HOSPITAL_BASED_OUTPATIENT_CLINIC_OR_DEPARTMENT_OTHER): Payer: Self-pay

## 2020-12-15 ENCOUNTER — Other Ambulatory Visit: Payer: Self-pay

## 2020-12-15 DIAGNOSIS — Z7982 Long term (current) use of aspirin: Secondary | ICD-10-CM | POA: Insufficient documentation

## 2020-12-15 DIAGNOSIS — Z9861 Coronary angioplasty status: Secondary | ICD-10-CM | POA: Insufficient documentation

## 2020-12-15 DIAGNOSIS — F17210 Nicotine dependence, cigarettes, uncomplicated: Secondary | ICD-10-CM | POA: Insufficient documentation

## 2020-12-15 DIAGNOSIS — I2510 Atherosclerotic heart disease of native coronary artery without angina pectoris: Secondary | ICD-10-CM | POA: Insufficient documentation

## 2020-12-15 DIAGNOSIS — I129 Hypertensive chronic kidney disease with stage 1 through stage 4 chronic kidney disease, or unspecified chronic kidney disease: Secondary | ICD-10-CM | POA: Insufficient documentation

## 2020-12-15 DIAGNOSIS — N182 Chronic kidney disease, stage 2 (mild): Secondary | ICD-10-CM | POA: Insufficient documentation

## 2020-12-15 DIAGNOSIS — F17290 Nicotine dependence, other tobacco product, uncomplicated: Secondary | ICD-10-CM | POA: Insufficient documentation

## 2020-12-15 DIAGNOSIS — L03012 Cellulitis of left finger: Secondary | ICD-10-CM | POA: Insufficient documentation

## 2020-12-15 DIAGNOSIS — Z79899 Other long term (current) drug therapy: Secondary | ICD-10-CM | POA: Insufficient documentation

## 2020-12-15 DIAGNOSIS — E1022 Type 1 diabetes mellitus with diabetic chronic kidney disease: Secondary | ICD-10-CM | POA: Insufficient documentation

## 2020-12-15 MED ORDER — CLINDAMYCIN HCL 150 MG PO CAPS: 300.0000 mg | ORAL_CAPSULE | Freq: Three times a day (TID) | ORAL | 0 refills | Status: AC

## 2020-12-15 MED ORDER — CLINDAMYCIN HCL 150 MG PO CAPS
300.0000 mg | ORAL_CAPSULE | Freq: Once | ORAL | Status: AC
  Administered 2020-12-15: 300 mg via ORAL
  Filled 2020-12-15: qty 2

## 2020-12-15 MED ORDER — LIDOCAINE HCL 2 % IJ SOLN
10.0000 mL | Freq: Once | INTRAMUSCULAR | Status: AC
  Administered 2020-12-15: 200 mg via INTRADERMAL
  Filled 2020-12-15: qty 20

## 2020-12-15 MED ORDER — HYDROCODONE-ACETAMINOPHEN 5-325 MG PO TABS: 2.0000 | ORAL_TABLET | ORAL | 0 refills | Status: DC | PRN

## 2020-12-15 NOTE — Discharge Instructions (Addendum)
Follow up here in 2 days (12/17/20). If not better by next week, call the hand surgeon, Dr. Roney Mans at [phone]. Stop by the pharmacy to pick up your medications. Do not drive or operate heavy machinery while taking prescription pain medication.   Take Care,   Dr. Alecia Lemming [facility] Emergency Department

## 2020-12-15 NOTE — ED Notes (Signed)
Area left thumb with redness, swelling and warmth, was seen for same 2 days ago, drained by physician, taking antibiotics as prescribed.  No visible open area but increased swelling and pain.

## 2020-12-15 NOTE — ED Triage Notes (Signed)
Pt states he was seen 3/6-left thumb with increase swelling/pain-NAD-steady gait

## 2020-12-15 NOTE — ED Provider Notes (Addendum)
[facility] EMERGENCY DEPARTMENT Provider Note   CSN: [id number] Arrival date & time: 12/15/20  1714     History Chief Complaint  Patient presents with  . Follow-up    Matthew Fisher is a 50 y.o. male.  HPI  Pt here for follow up. He had a left thumb paronychia drained 4 days ago. Reports purulent discharge from finger but none today. Swelling and pain in finger has been increasing over the last day or two. Pain rated 9/10 currently. Has been taking antibiotics as prescribed. Denies fever, chills, vomiting and chills. He has no other complaints.       Past Medical History:  Diagnosis Date  . Arthritis   . CAD (coronary artery disease)    a. NSTEMI 10/2017 s/p multivessel PCI of the OM1, PDA, PLA ostium and mid PLA.  . CKD (chronic kidney disease), stage II   . Gout   . Hypertension   . Insulin dependent diabetes mellitus     Patient Active Problem List   Diagnosis Date Noted  . CHF (congestive heart failure) (HCC) 09/11/2019  . Suspected congestive heart failure 09/10/2019  . Nonadherence to medical treatment   . Tobacco abuse disorder   . CAD (coronary artery disease) 10/18/2017  . CKD (chronic kidney disease), stage II 10/18/2017  . NSTEMI (non-ST elevated myocardial infarction) (HCC) 10/15/2017  . Insulin dependent diabetes mellitus 10/15/2017  . Hypertension 10/15/2017  . Gastroparesis due to secondary diabetes (HCC) 10/15/2017  . Hypertensive emergency, no CHF   . Medial meniscus tear 01/22/2013    Past Surgical History:  Procedure Laterality Date  . CORONARY STENT INTERVENTION N/A 10/17/2017   Procedure: CORONARY STENT INTERVENTION;  Surgeon: Corky Crafts, MD;  Location: [facility] INVASIVE CV LAB;  Service: Cardiovascular;  Laterality: N/A;  . FRACTURE SURGERY     right ring finger  . KNEE ARTHROSCOPY WITH LATERAL MENISECTOMY Left 01/22/2013   Procedure: LEFT KNEE ARTHROSCOPY DEBRIDEMENT CHONDROPLASTY, LATERAL RELEASE AND partial medial  meniscectomy;  Surgeon: Javier Docker, MD;  Location: [facility];  Service: Orthopedics;  Laterality: Left;  . KNEE SURGERY     "removed bone"  . LEFT HEART CATH N/A 10/17/2017   Procedure: Left Heart Cath;  Surgeon: Corky Crafts, MD;  Location: [facility] INVASIVE CV LAB;  Service: Cardiovascular;  Laterality: N/A;  . LEFT HEART CATH AND CORONARY ANGIOGRAPHY N/A 10/15/2017   Procedure: LEFT HEART CATH AND CORONARY ANGIOGRAPHY;  Surgeon: Marykay Lex, MD;  Location: [facility] INVASIVE CV LAB;  Service: Cardiovascular;  Laterality: N/A;  . SHOULDER SURGERY         Family History  Problem Relation Age of Onset  . Diabetes Mother   . Cancer Father     Social History   Tobacco Use  . Smoking status: Current Some Day Smoker    Packs/day: 0.50    Years: 20.00    Pack years: 10.00    Types: Cigarettes, Cigars    Last attempt to quit: 10/08/2002    Years since quitting: 18.2  . Smokeless tobacco: Never Used  Vaping Use  . Vaping Use: Never used  Substance Use Topics  . Alcohol use: Yes    Comment: occasional  . Drug use: No    Home Medications Prior to Admission medications   Medication Sig Start Date End Date Taking? Authorizing Provider  clindamycin (CLEOCIN) 150 MG capsule Take 2 capsules (300 mg total) by mouth 3 (three) times daily for 7 days. 12/15/20 12/22/20 Yes Tamaka Sawin, Seward Meth, DO  HYDROcodone-acetaminophen (NORCO/VICODIN) 5-325 MG tablet Take 2 tablets by mouth every 4 (four) hours as needed. 12/15/20  Yes Tonae Livolsi, DO  sulfamethoxazole-trimethoprim (BACTRIM DS) 800-160 MG tablet Take 1 tablet by mouth 2 (two) times daily for 5 days. 12/11/20 12/16/20 Yes Benjiman Core, MD  acetaminophen (TYLENOL) 325 MG tablet Take 2 tablets (650 mg total) by mouth every 4 (four) hours as needed for headache or mild pain. 09/12/19   Swayze, Ava, DO  acetaminophen (TYLENOL) 500 MG tablet Take 2 tablets (1,000 mg total) by mouth every 6 (six) hours as needed. 10/13/20   Arby Barrette, MD   amLODipine (NORVASC) 10 MG tablet Take 1 tablet (10 mg total) by mouth daily. 10/28/17   Jodelle Gross, NP  aspirin 81 MG chewable tablet Chew 1 tablet (81 mg total) by mouth daily. 10/20/17   Dunn, Raymon Mutton, PA-C  atorvastatin (LIPITOR) 80 MG tablet Take 1 tablet (80 mg total) by mouth daily at 6 PM. 10/28/17   Jodelle Gross, NP  carvedilol (COREG) 25 MG tablet Take 1 tablet (25 mg total) by mouth 2 (two) times daily with a meal. 10/28/17   Jodelle Gross, NP  clopidogrel (PLAVIX) 75 MG tablet Take 1 tablet (75 mg total) by mouth daily. 10/28/17   Jodelle Gross, NP  diclofenac Sodium (VOLTAREN) 1 % GEL Apply 2 g topically 4 (four) times daily as needed (leg pain). 11/11/20   Long, Arlyss Repress, MD  ferrous sulfate 325 (65 FE) MG tablet Take 325 mg by mouth every morning. 08/06/19   [provider]  furosemide (LASIX) 40 MG tablet Take 1 tablet (40 mg total) by mouth daily. 09/12/19 09/11/20  Swayze, Ava, DO  insulin detemir (LEVEMIR) 100 UNIT/ML injection Inject 20 Units into the skin daily.    [provider]  methocarbamol (ROBAXIN) 500 MG tablet Take 1 tablet (500 mg total) by mouth every 6 (six) hours as needed for muscle spasms. 10/13/20   Arby Barrette, MD  polyethylene glycol (MIRALAX) 17 g packet Take 17 g by mouth daily. Patient taking differently: Take 17 g by mouth daily as needed for mild constipation. 07/04/19   Palumbo, April, MD  potassium chloride 20 MEQ TBCR Take 20 mEq by mouth daily. 09/12/19   Swayze, Ava, DO  PROTONIX 40 MG tablet Take 40 mg by mouth 2 (two) times daily. 10/10/17   [provider]  Vitamin D, Ergocalciferol, (DRISDOL) 1.25 MG (50000 UT) CAPS capsule Take by mouth every 7 (seven) days.  11/27/18   [provider]    Allergies    Patient has no known allergies.  Review of Systems   Review of Systems  Constitutional: Negative for chills and fever.  Gastrointestinal: Negative for vomiting.  Skin:       Left thumb  pain  All other systems reviewed and are negative.  See HPI Physical Exam Updated Vital Signs BP (!) 150/83   Pulse 72   Temp 97.8 F (36.6 C) (Oral)   Resp 16   SpO2 100%   Physical Exam Constitutional:      General: He is not in acute distress.    Appearance: Normal appearance. He is not ill-appearing.  HENT:     Head: Normocephalic and atraumatic.     Nose: Nose normal. No rhinorrhea.  Eyes:     Extraocular Movements: Extraocular movements intact.     Conjunctiva/sclera: Conjunctivae normal.  Cardiovascular:     Rate and Rhythm: Normal rate.  Pulses: Normal pulses.  Pulmonary:     Effort: Pulmonary effort is normal. No respiratory distress.  Musculoskeletal:     Cervical back: Normal range of motion.     Comments: Right thumb: TTP, hypersensitive to touch, decreased ROM 2/2 to edema but is able to flex and extend PIP, good radial pulse   Skin:    General: Skin is warm.     Capillary Refill: Capillary refill takes less than 2 seconds.     Comments: See images below  Neurological:     Mental Status: He is alert. Mental status is at baseline.  Psychiatric:        Mood and Affect: Mood normal.        Behavior: Behavior normal.            ED Results / Procedures / Treatments   Labs (all labs ordered are listed, but only abnormal results are displayed) Labs Reviewed  AEROBIC CULTURE W GRAM STAIN (SUPERFICIAL SPECIMEN)    EKG None  Radiology No results found.  Procedures Procedures   Medications Ordered in ED Medications  lidocaine (XYLOCAINE) 2 % (with pres) injection 200 mg (200 mg Intradermal Given 12/15/20 2004)  clindamycin (CLEOCIN) capsule 300 mg (300 mg Oral Given 12/15/20 2004)    ED Course  I have reviewed the triage vital signs and the nursing notes.  Pertinent labs & imaging results that were available during my care of the patient were reviewed by me and considered in my medical decision making (see chart for details).  8:07 PM  Spoke with Hydrographic surveyor on call.     MDM Rules/Calculators/A&P                          Pt here for follow up after I&D of paronychia of left thumb. Here for increasing pain and swelling. Discussed felon vs paronychia with hand surgeon. Recommended changing antibiotics to clindamycin after I&D. Performed bedside I&D after consent obtain. Pt tolerated the procedure well. Wound culture collected. Pt to follow up in 2 days and 1 week if not improving with hand surgeon.    Final Clinical Impression(s) / ED Diagnoses Final diagnoses:  Felon of finger of left hand    Rx / DC Orders ED Discharge Orders         Ordered    clindamycin (CLEOCIN) 150 MG capsule  3 times daily        12/15/20 2137    HYDROcodone-acetaminophen (NORCO/VICODIN) 5-325 MG tablet  Every 4 hours PRN        12/15/20 2137           Katha Cabal, DO 12/16/20 0038    Katha Cabal, DO 12/16/20 0104    Charlynne Pander, MD 12/19/20 1750

## 2020-12-19 LAB — AEROBIC CULTURE W GRAM STAIN (SUPERFICIAL SPECIMEN)

## 2021-09-04 ENCOUNTER — Other Ambulatory Visit: Payer: Self-pay

## 2021-09-04 ENCOUNTER — Encounter (HOSPITAL_BASED_OUTPATIENT_CLINIC_OR_DEPARTMENT_OTHER): Payer: Self-pay | Admitting: Urology

## 2021-09-04 DIAGNOSIS — I509 Heart failure, unspecified: Secondary | ICD-10-CM | POA: Insufficient documentation

## 2021-09-04 DIAGNOSIS — N182 Chronic kidney disease, stage 2 (mild): Secondary | ICD-10-CM | POA: Insufficient documentation

## 2021-09-04 DIAGNOSIS — Z7982 Long term (current) use of aspirin: Secondary | ICD-10-CM | POA: Insufficient documentation

## 2021-09-04 DIAGNOSIS — I2510 Atherosclerotic heart disease of native coronary artery without angina pectoris: Secondary | ICD-10-CM | POA: Insufficient documentation

## 2021-09-04 DIAGNOSIS — Z79899 Other long term (current) drug therapy: Secondary | ICD-10-CM | POA: Insufficient documentation

## 2021-09-04 DIAGNOSIS — F17210 Nicotine dependence, cigarettes, uncomplicated: Secondary | ICD-10-CM | POA: Insufficient documentation

## 2021-09-04 DIAGNOSIS — Z7902 Long term (current) use of antithrombotics/antiplatelets: Secondary | ICD-10-CM | POA: Insufficient documentation

## 2021-09-04 DIAGNOSIS — Z20822 Contact with and (suspected) exposure to covid-19: Secondary | ICD-10-CM | POA: Insufficient documentation

## 2021-09-04 DIAGNOSIS — J101 Influenza due to other identified influenza virus with other respiratory manifestations: Secondary | ICD-10-CM | POA: Insufficient documentation

## 2021-09-04 DIAGNOSIS — I130 Hypertensive heart and chronic kidney disease with heart failure and stage 1 through stage 4 chronic kidney disease, or unspecified chronic kidney disease: Secondary | ICD-10-CM | POA: Insufficient documentation

## 2021-09-04 DIAGNOSIS — Z28310 Unvaccinated for covid-19: Secondary | ICD-10-CM | POA: Insufficient documentation

## 2021-09-04 DIAGNOSIS — Z794 Long term (current) use of insulin: Secondary | ICD-10-CM | POA: Insufficient documentation

## 2021-09-04 LAB — RESP PANEL BY RT-PCR (FLU A&B, COVID) ARPGX2
Influenza A by PCR: POSITIVE — AB
Influenza B by PCR: NEGATIVE
SARS Coronavirus 2 by RT PCR: NEGATIVE

## 2021-09-04 MED ORDER — ACETAMINOPHEN 325 MG PO TABS
650.0000 mg | ORAL_TABLET | Freq: Once | ORAL | Status: AC | PRN
  Administered 2021-09-04: 650 mg via ORAL

## 2021-09-04 NOTE — ED Triage Notes (Signed)
Pt states productive cough since last night, States pain in chest with cough, shortness of breath with exertion States N/V/D

## 2021-09-05 ENCOUNTER — Emergency Department (HOSPITAL_BASED_OUTPATIENT_CLINIC_OR_DEPARTMENT_OTHER)
Admission: EM | Admit: 2021-09-05 | Discharge: 2021-09-05 | Disposition: A | Discharge status: Home / Self Care | Payer: Self-pay | Attending: Emergency Medicine | Admitting: Emergency Medicine

## 2021-09-05 DIAGNOSIS — J101 Influenza due to other identified influenza virus with other respiratory manifestations: Secondary | ICD-10-CM

## 2021-09-05 MED ORDER — ONDANSETRON 4 MG PO TBDP: 4.0000 mg | ORAL_TABLET | Freq: Three times a day (TID) | ORAL | 0 refills | Status: DC | PRN

## 2021-09-05 MED ORDER — OSELTAMIVIR PHOSPHATE 75 MG PO CAPS: 75.0000 mg | ORAL_CAPSULE | Freq: Two times a day (BID) | ORAL | 0 refills | Status: DC

## 2021-09-05 NOTE — ED Provider Notes (Signed)
[facility] EMERGENCY DEPARTMENT Provider Note   CSN: [id number] Arrival date & time: 09/04/21  2213     History Chief Complaint  Patient presents with   Cough    Matthew Fisher is a 50 y.o. male.  HPI     This is a 50 year old male with history of coronary artery disease, chronic kidney disease, hypertension, diabetes who presents with upper respiratory symptoms.  Reports cough and shortness of breath.  Reports nausea, vomiting, diarrhea.  Reports body aches without fevers.  Daughter is sick with similar symptoms.  Has not had a COVID or influenza vaccine this season.  He has not take anything for his symptoms at home.  Symptoms started on Sunday night.  Past Medical History:  Diagnosis Date   Arthritis    CAD (coronary artery disease)    a. NSTEMI 10/2017 s/p multivessel PCI of the OM1, PDA, PLA ostium and mid PLA.   CKD (chronic kidney disease), stage II    Gout    Hypertension    Insulin dependent diabetes mellitus     Patient Active Problem List   Diagnosis Date Noted   CHF (congestive heart failure) ([place]) 09/11/2019   Suspected congestive heart failure 09/10/2019   Nonadherence to medical treatment    Tobacco abuse disorder    CAD (coronary artery disease) 10/18/2017   CKD (chronic kidney disease), stage II 10/18/2017   NSTEMI (non-ST elevated myocardial infarction) ([place]) 10/15/2017   Insulin dependent diabetes mellitus 10/15/2017   Hypertension 10/15/2017   Gastroparesis due to secondary diabetes ([place]) 10/15/2017   Hypertensive emergency, no CHF    Medial meniscus tear 01/22/2013    Past Surgical History:  Procedure Laterality Date   CORONARY STENT INTERVENTION N/A 10/17/2017   Procedure: CORONARY STENT INTERVENTION;  Surgeon: Jettie Booze, MD;  Location: [facility];  Service: Cardiovascular;  Laterality: N/A;   FRACTURE SURGERY     right ring finger   KNEE ARTHROSCOPY WITH LATERAL MENISECTOMY Left 01/22/2013   Procedure: LEFT  KNEE ARTHROSCOPY DEBRIDEMENT CHONDROPLASTY, LATERAL RELEASE AND partial medial meniscectomy;  Surgeon: Johnn Hai, MD;  Location: [facility];  Service: Orthopedics;  Laterality: Left;   KNEE SURGERY     "removed bone"   LEFT HEART CATH N/A 10/17/2017   Procedure: Left Heart Cath;  Surgeon: Jettie Booze, MD;  Location: [facility];  Service: Cardiovascular;  Laterality: N/A;   LEFT HEART CATH AND CORONARY ANGIOGRAPHY N/A 10/15/2017   Procedure: LEFT HEART CATH AND CORONARY ANGIOGRAPHY;  Surgeon: Leonie Man, MD;  Location: [facility];  Service: Cardiovascular;  Laterality: N/A;   SHOULDER SURGERY         Family History  Problem Relation Age of Onset   Diabetes Mother    Cancer Father     Social History   Tobacco Use   Smoking status: Some Days    Packs/day: 0.50    Years: 20.00    Pack years: 10.00    Types: Cigarettes, Cigars    Last attempt to quit: 10/08/2002    Years since quitting: 18.9   Smokeless tobacco: Never  Vaping Use   Vaping Use: Never used  Substance Use Topics   Alcohol use: Yes    Comment: occasional   Drug use: No    Home Medications Prior to Admission medications   Medication Sig Start Date End Date Taking? Authorizing Provider  ondansetron (ZOFRAN-ODT) 4 MG disintegrating tablet Take 1 tablet (4 mg total) by mouth  every 8 (eight) hours as needed for nausea or vomiting. 09/05/21  Yes Briannon Boggio, Barbette Hair, MD  oseltamivir (TAMIFLU) 75 MG capsule Take 1 capsule (75 mg total) by mouth every 12 (twelve) hours. 09/05/21  Yes Zarion Oliff, Barbette Hair, MD  acetaminophen (TYLENOL) 325 MG tablet Take 2 tablets (650 mg total) by mouth every 4 (four) hours as needed for headache or mild pain. 09/12/19   Swayze, Ava, DO  acetaminophen (TYLENOL) 500 MG tablet Take 2 tablets (1,000 mg total) by mouth every 6 (six) hours as needed. 10/13/20   Charlesetta Shanks, MD  amLODipine (NORVASC) 10 MG tablet Take 1 tablet (10 mg total) by mouth daily. 10/28/17    Lendon Colonel, NP  aspirin 81 MG chewable tablet Chew 1 tablet (81 mg total) by mouth daily. 10/20/17   Dunn, Areta Haber, PA-C  atorvastatin (LIPITOR) 80 MG tablet Take 1 tablet (80 mg total) by mouth daily at 6 PM. 10/28/17   Lendon Colonel, NP  carvedilol (COREG) 25 MG tablet Take 1 tablet (25 mg total) by mouth 2 (two) times daily with a meal. 10/28/17   Lendon Colonel, NP  clopidogrel (PLAVIX) 75 MG tablet Take 1 tablet (75 mg total) by mouth daily. 10/28/17   Lendon Colonel, NP  diclofenac Sodium (VOLTAREN) 1 % GEL Apply 2 g topically 4 (four) times daily as needed (leg pain). 11/11/20   Long, Wonda Olds, MD  ferrous sulfate 325 (65 FE) MG tablet Take 325 mg by mouth every morning. 08/06/19   [provider]  furosemide (LASIX) 40 MG tablet Take 1 tablet (40 mg total) by mouth daily. 09/12/19 09/11/20  Swayze, Ava, DO  HYDROcodone-acetaminophen (NORCO/VICODIN) 5-325 MG tablet Take 2 tablets by mouth every 4 (four) hours as needed. 12/15/20   Brimage, Ronnette Juniper, DO  insulin detemir (LEVEMIR) 100 UNIT/ML injection Inject 20 Units into the skin daily.    [provider]  methocarbamol (ROBAXIN) 500 MG tablet Take 1 tablet (500 mg total) by mouth every 6 (six) hours as needed for muscle spasms. 10/13/20   Charlesetta Shanks, MD  polyethylene glycol (MIRALAX) 17 g packet Take 17 g by mouth daily. Patient taking differently: Take 17 g by mouth daily as needed for mild constipation. 07/04/19   Palumbo, April, MD  potassium chloride 20 MEQ TBCR Take 20 mEq by mouth daily. 09/12/19   Swayze, Ava, DO  PROTONIX 40 MG tablet Take 40 mg by mouth 2 (two) times daily. 10/10/17   [provider]  Vitamin D, Ergocalciferol, (DRISDOL) 1.25 MG (50000 UT) CAPS capsule Take by mouth every 7 (seven) days.  11/27/18   [provider]    Allergies    Patient has no known allergies.  Review of Systems   Review of Systems  Constitutional:  Positive for chills. Negative for fever.   HENT:  Positive for congestion.   Respiratory:  Positive for cough and shortness of breath.   Cardiovascular:  Negative for chest pain.  Gastrointestinal:  Positive for nausea and vomiting. Negative for abdominal pain.  Musculoskeletal:  Positive for myalgias.  Neurological:  Negative for headaches.  All other systems reviewed and are negative.  Physical Exam Updated Vital Signs BP (!) 142/74   Pulse 94   Temp 99.1 F (37.3 C) (Oral)   Resp 18   Ht 1.727 m (5\' 8" )   Wt 98.4 kg   SpO2 100%   BMI 32.99 kg/m   Physical Exam Vitals and nursing note reviewed.  Constitutional:  Appearance: He is well-developed. He is not ill-appearing.  HENT:     Head: Normocephalic and atraumatic.     Nose: Congestion present.     Mouth/Throat:     Mouth: Mucous membranes are moist.  Eyes:     Pupils: Pupils are equal, round, and reactive to light.  Cardiovascular:     Rate and Rhythm: Normal rate and regular rhythm.     Heart sounds: Normal heart sounds. No murmur heard. Pulmonary:     Effort: Pulmonary effort is normal. No respiratory distress.     Breath sounds: Normal breath sounds. No wheezing.  Abdominal:     General: Bowel sounds are normal.     Palpations: Abdomen is soft.     Tenderness: There is no abdominal tenderness. There is no rebound.  Musculoskeletal:     Cervical back: Neck supple.     Right lower leg: No edema.     Left lower leg: No edema.  Lymphadenopathy:     Cervical: No cervical adenopathy.  Skin:    General: Skin is warm and dry.  Neurological:     Mental Status: He is alert and oriented to person, place, and time.  Psychiatric:        Mood and Affect: Mood normal.    ED Results / Procedures / Treatments   Labs (all labs ordered are listed, but only abnormal results are displayed) Labs Reviewed  RESP PANEL BY RT-PCR (FLU A&B, COVID) ARPGX2 - Abnormal; Notable for the following components:      Result Value   Influenza A by PCR POSITIVE (*)     All other components within normal limits    EKG None  Radiology No results found.  Procedures Procedures   Medications Ordered in ED Medications  acetaminophen (TYLENOL) tablet 650 mg (650 mg Oral Given 09/04/21 2228)    ED Course  I have reviewed the triage vital signs and the nursing notes.  Pertinent labs & imaging results that were available during my care of the patient were reviewed by me and considered in my medical decision making (see chart for details).    MDM Rules/Calculators/A&P                           Patient presents with upper respiratory symptoms.  Daughter with similar symptoms.  Initially temperature noted to be 101.2 with heart rate 110.  On my evaluation, he is in no respiratory distress.  Physical exam is fairly benign.  No wheezing.  No abdominal tenderness.  COVID and influenza testing sent.  Patient tested positive for influenza A.  He was offered Tamiflu given onset of symptoms and medical history.  Additionally recommend supportive measures including hydration, Zofran, Tylenol, or ibuprofen.  Patient stated understanding.  Do not feel he needs imaging as I have low suspicion for pneumonia or other etiology.  After history, exam, and medical workup I feel the patient has been appropriately medically screened and is safe for discharge home. Pertinent diagnoses were discussed with the patient. Patient was given return precautions.  Final Clinical Impression(s) / ED Diagnoses Final diagnoses:  Influenza A    Rx / DC Orders ED Discharge Orders          Ordered    ondansetron (ZOFRAN-ODT) 4 MG disintegrating tablet  Every 8 hours PRN        09/05/21 0225    oseltamivir (TAMIFLU) 75 MG capsule  Every 12 hours  09/05/21 0225             Merryl Hacker, MD 09/05/21 [phone]

## 2021-09-05 NOTE — Discharge Instructions (Signed)
Make sure you are staying hydrated.  Take Zofran as needed for nausea.  Take Tylenol or ibuprofen for body aches and pains.  You will be given a Tamiflu prescription

## 2021-09-25 ENCOUNTER — Emergency Department (HOSPITAL_BASED_OUTPATIENT_CLINIC_OR_DEPARTMENT_OTHER): Payer: Self-pay

## 2021-09-25 ENCOUNTER — Other Ambulatory Visit: Payer: Self-pay

## 2021-09-25 ENCOUNTER — Emergency Department (HOSPITAL_BASED_OUTPATIENT_CLINIC_OR_DEPARTMENT_OTHER)
Admission: EM | Admit: 2021-09-25 | Discharge: 2021-09-26 | Disposition: A | Discharge status: Home / Self Care | Payer: Self-pay | Attending: Emergency Medicine | Admitting: Emergency Medicine

## 2021-09-25 ENCOUNTER — Encounter (HOSPITAL_BASED_OUTPATIENT_CLINIC_OR_DEPARTMENT_OTHER): Payer: Self-pay

## 2021-09-25 DIAGNOSIS — I509 Heart failure, unspecified: Secondary | ICD-10-CM | POA: Insufficient documentation

## 2021-09-25 DIAGNOSIS — M25461 Effusion, right knee: Secondary | ICD-10-CM

## 2021-09-25 DIAGNOSIS — Z7982 Long term (current) use of aspirin: Secondary | ICD-10-CM | POA: Insufficient documentation

## 2021-09-25 DIAGNOSIS — M25561 Pain in right knee: Secondary | ICD-10-CM | POA: Insufficient documentation

## 2021-09-25 DIAGNOSIS — Z794 Long term (current) use of insulin: Secondary | ICD-10-CM | POA: Insufficient documentation

## 2021-09-25 DIAGNOSIS — M7989 Other specified soft tissue disorders: Secondary | ICD-10-CM | POA: Insufficient documentation

## 2021-09-25 DIAGNOSIS — I2510 Atherosclerotic heart disease of native coronary artery without angina pectoris: Secondary | ICD-10-CM | POA: Insufficient documentation

## 2021-09-25 DIAGNOSIS — Z87891 Personal history of nicotine dependence: Secondary | ICD-10-CM | POA: Insufficient documentation

## 2021-09-25 DIAGNOSIS — E1143 Type 2 diabetes mellitus with diabetic autonomic (poly)neuropathy: Secondary | ICD-10-CM | POA: Insufficient documentation

## 2021-09-25 DIAGNOSIS — I130 Hypertensive heart and chronic kidney disease with heart failure and stage 1 through stage 4 chronic kidney disease, or unspecified chronic kidney disease: Secondary | ICD-10-CM | POA: Insufficient documentation

## 2021-09-25 DIAGNOSIS — N182 Chronic kidney disease, stage 2 (mild): Secondary | ICD-10-CM | POA: Insufficient documentation

## 2021-09-25 DIAGNOSIS — M549 Dorsalgia, unspecified: Secondary | ICD-10-CM | POA: Insufficient documentation

## 2021-09-25 DIAGNOSIS — Z955 Presence of coronary angioplasty implant and graft: Secondary | ICD-10-CM | POA: Insufficient documentation

## 2021-09-25 DIAGNOSIS — N189 Chronic kidney disease, unspecified: Secondary | ICD-10-CM

## 2021-09-25 DIAGNOSIS — Z79899 Other long term (current) drug therapy: Secondary | ICD-10-CM | POA: Insufficient documentation

## 2021-09-25 LAB — CBC WITH DIFFERENTIAL/PLATELET
Abs Immature Granulocytes: 0.02 10*3/uL (ref 0.00–0.07)
Basophils Absolute: 0 10*3/uL (ref 0.0–0.1)
Basophils Relative: 1 %
Eosinophils Absolute: 0.2 10*3/uL (ref 0.0–0.5)
Eosinophils Relative: 3 %
HCT: 26.2 % — ABNORMAL LOW (ref 39.0–52.0)
Hemoglobin: 9.7 g/dL — ABNORMAL LOW (ref 13.0–17.0)
Immature Granulocytes: 0 %
Lymphocytes Relative: 31 %
Lymphs Abs: 2.2 10*3/uL (ref 0.7–4.0)
MCH: 32.2 pg (ref 26.0–34.0)
MCHC: 37 g/dL — ABNORMAL HIGH (ref 30.0–36.0)
MCV: 87 fL (ref 80.0–100.0)
Monocytes Absolute: 0.8 10*3/uL (ref 0.1–1.0)
Monocytes Relative: 11 %
Neutro Abs: 4 10*3/uL (ref 1.7–7.7)
Neutrophils Relative %: 54 %
Platelets: 219 10*3/uL (ref 150–400)
RBC: 3.01 MIL/uL — ABNORMAL LOW (ref 4.22–5.81)
RDW: 12.6 % (ref 11.5–15.5)
WBC: 7.2 10*3/uL (ref 4.0–10.5)
nRBC: 0 % (ref 0.0–0.2)

## 2021-09-25 LAB — URIC ACID: Uric Acid, Serum: 6.6 mg/dL (ref 3.7–8.6)

## 2021-09-25 LAB — BASIC METABOLIC PANEL
Anion gap: 8 (ref 5–15)
BUN: 30 mg/dL — ABNORMAL HIGH (ref 6–20)
CO2: 25 mmol/L (ref 22–32)
Calcium: 8.6 mg/dL — ABNORMAL LOW (ref 8.9–10.3)
Chloride: 100 mmol/L (ref 98–111)
Creatinine, Ser: 2.36 mg/dL — ABNORMAL HIGH (ref 0.61–1.24)
GFR, Estimated: 32 mL/min — ABNORMAL LOW (ref >60)
Glucose, Bld: 190 mg/dL — ABNORMAL HIGH (ref 70–99)
Potassium: 3.7 mmol/L (ref 3.5–5.1)
Sodium: 133 mmol/L — ABNORMAL LOW (ref 135–145)

## 2021-09-25 IMAGING — US US EXTREM LOW VENOUS*R*
1 series · 14 of 24 positions shown · non-contrast
Comparison: None.

CLINICAL DATA: Right lower extremity pain.

EXAM:
RIGHT LOWER EXTREMITY VENOUS DOPPLER ULTRASOUND
TECHNIQUE: Gray-scale sonography with compression, as well as color and duplex
ultrasound, were performed to evaluate the deep venous system(s)
from the level of the common femoral vein through the popliteal and
proximal calf veins.

[Series 1: us extrem low venous*right* · 14 of 35 slices shown]
[im 1/35]
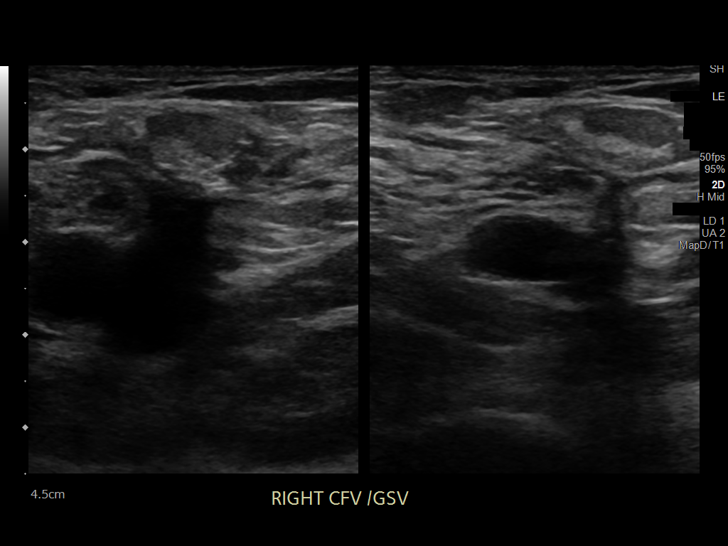
[im 3/35]
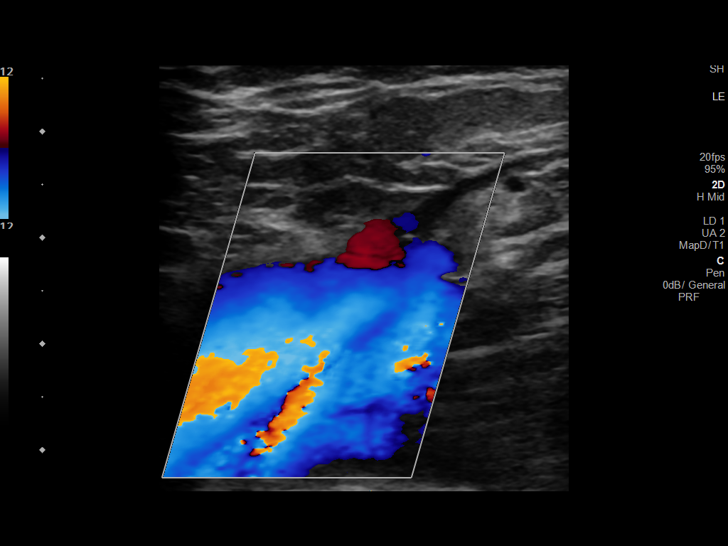
[im 6/35]
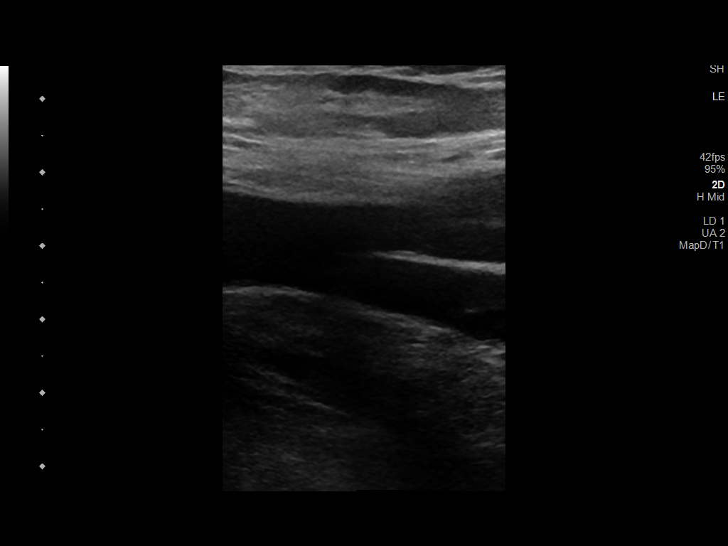
[im 9/35]
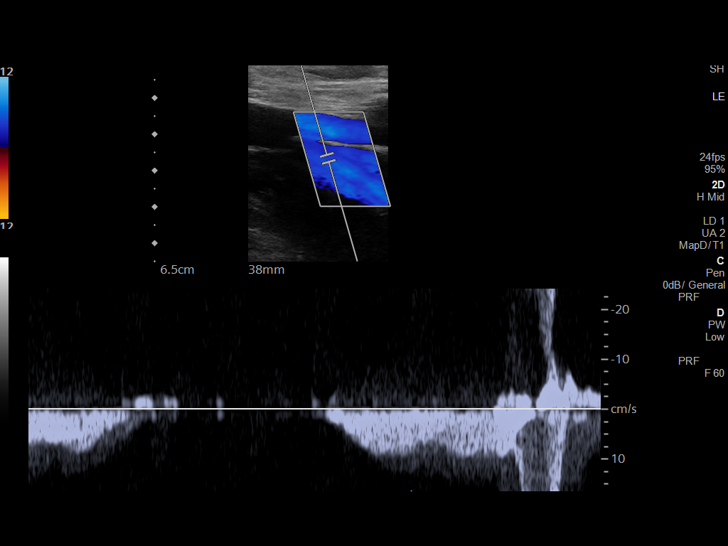
[im 11/35]
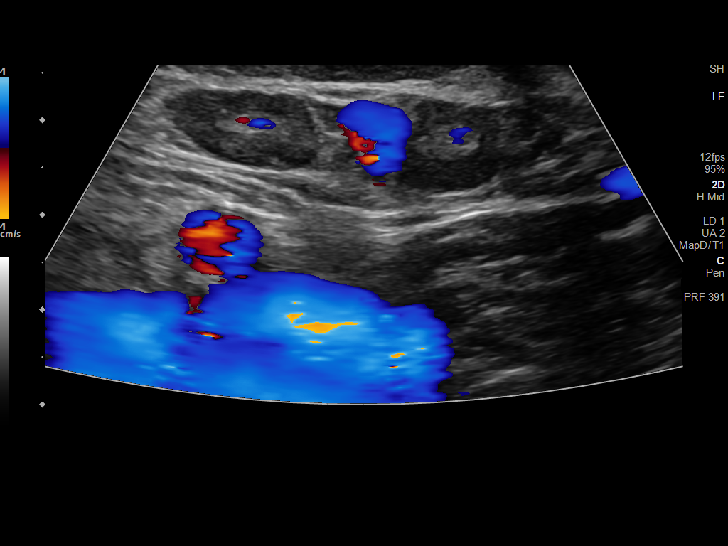
[im 14/35]
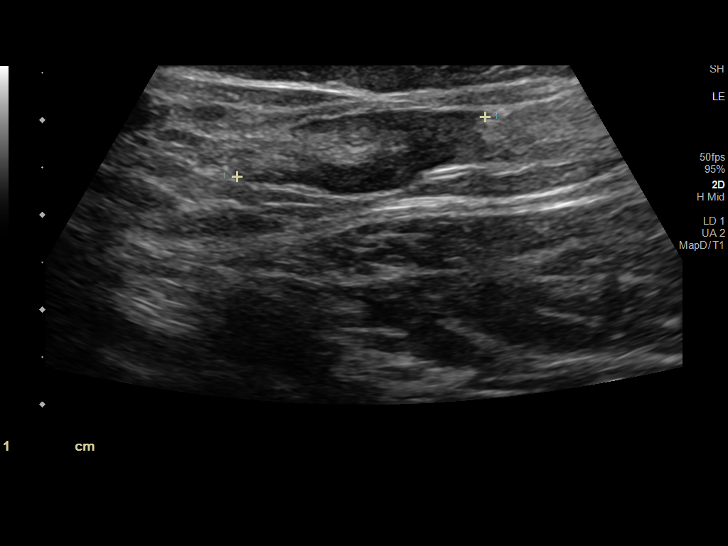
[im 17/35]
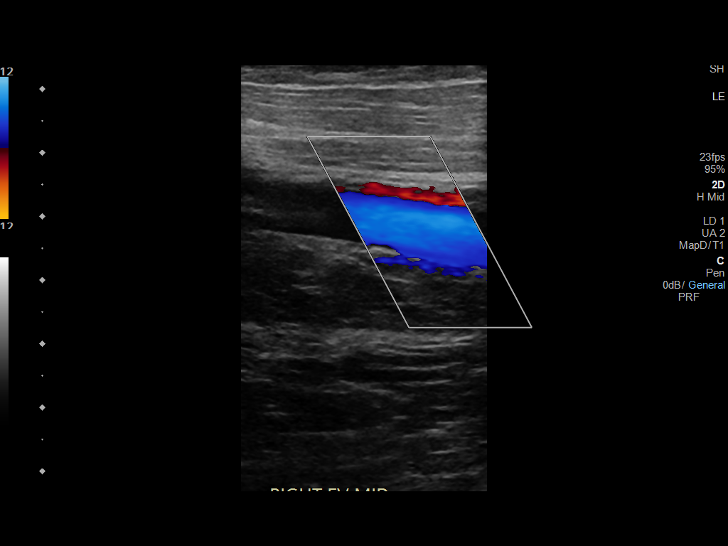
[im 18/35]
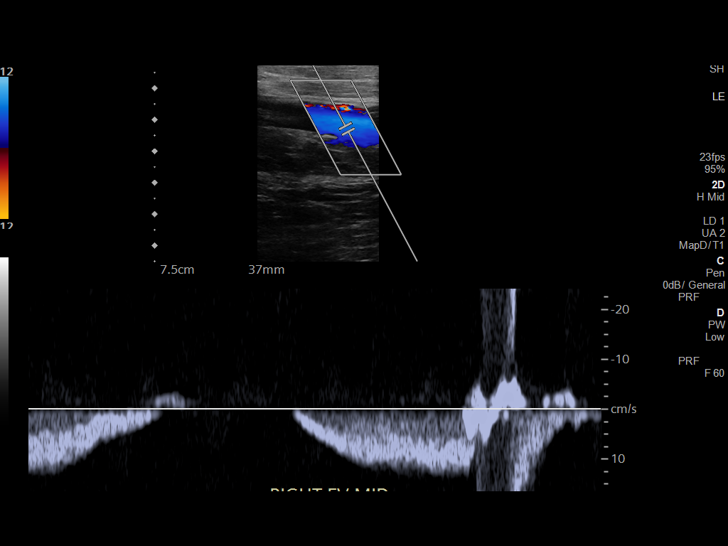
[im 21/35]
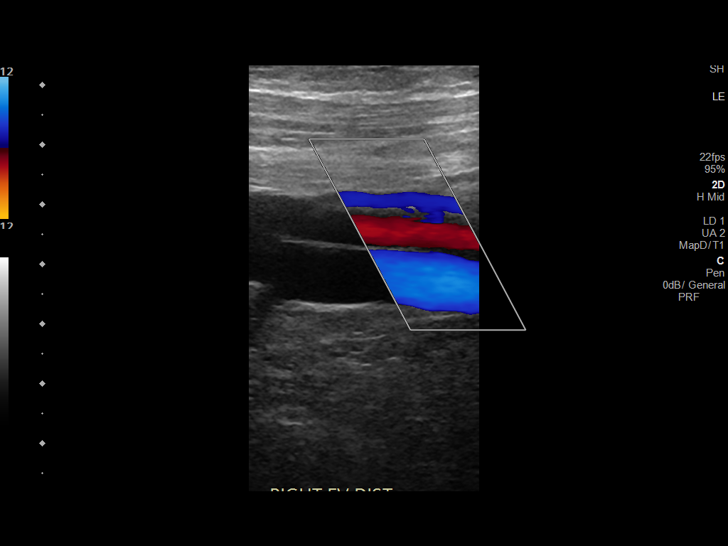
[im 24/35]
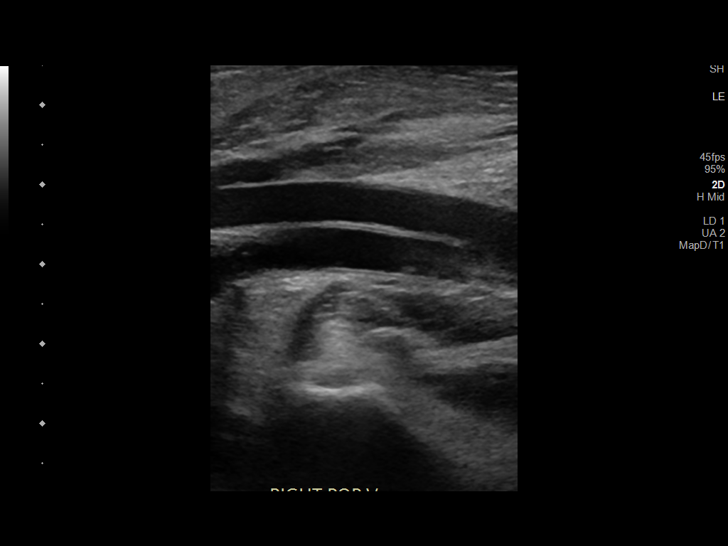
[im 27/35]
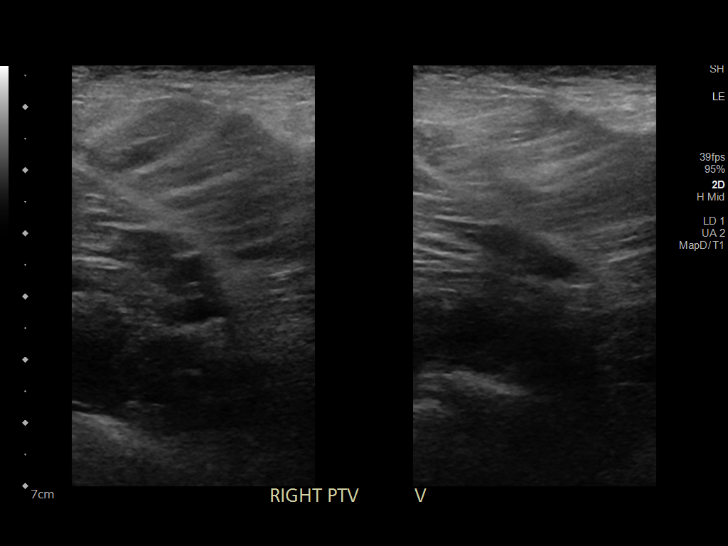
[im 29/35]
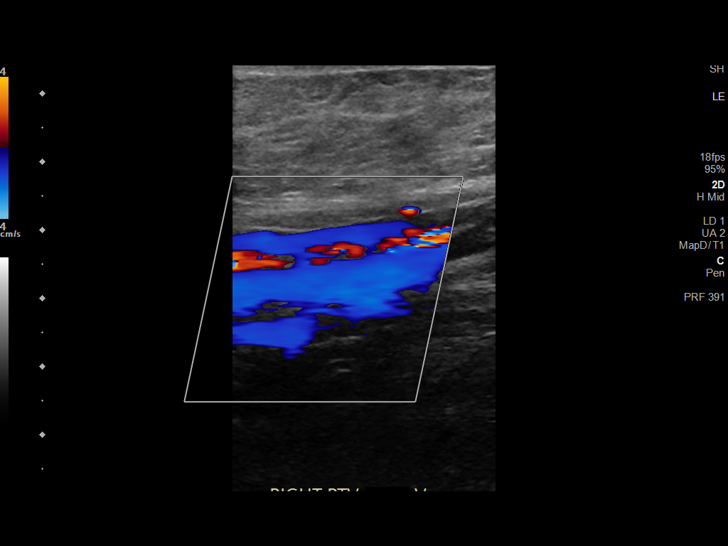
[im 32/35]
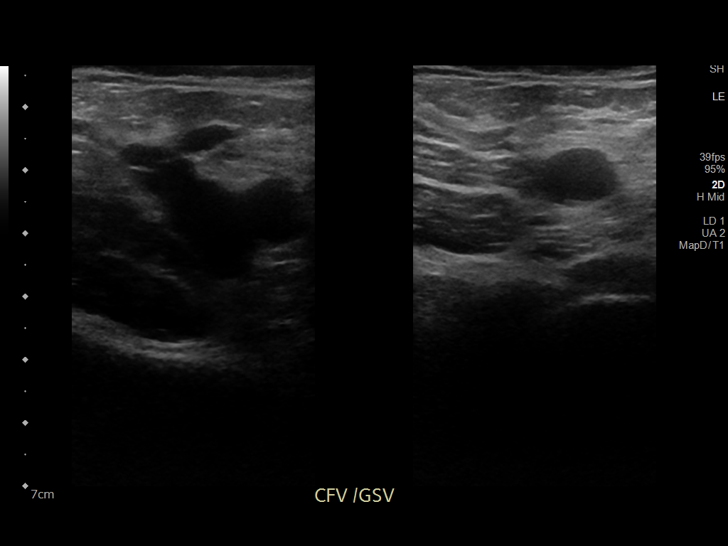
[im 35/35]
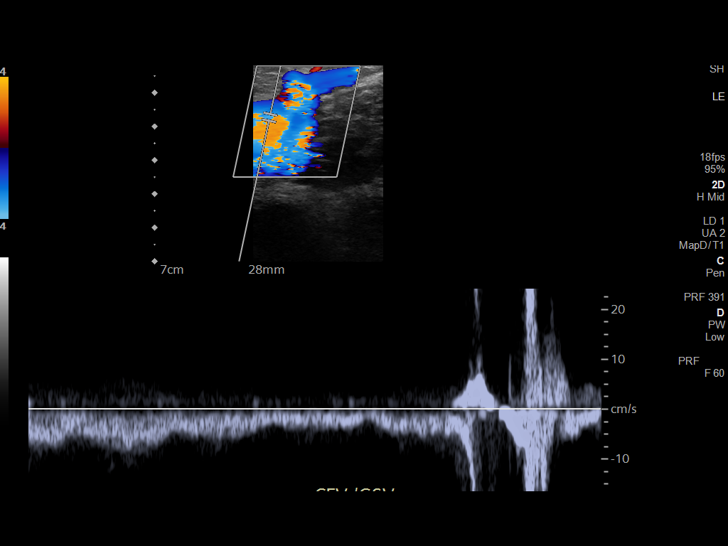

[14 of 24 positions shown; findings below may reference images not displayed]

FINDINGS: VENOUS

Normal compressibility of the common femoral, superficial femoral,
and popliteal veins, as well as the visualized calf veins.
Visualized portions of profunda femoral vein and great saphenous
vein unremarkable. No filling defects to suggest DVT on grayscale or
color Doppler imaging. Doppler waveforms show normal direction of
venous flow, normal respiratory plasticity and response to
augmentation.

Limited views of the contralateral common femoral vein are
unremarkable.

OTHER

None.

Limitations: none
IMPRESSION: No evidence of DVT within the RIGHT lower extremity.

## 2021-09-25 IMAGING — DX DG KNEE COMPLETE 4+V*R*
4 series · 4 of 4 positions shown · non-contrast
Comparison: Right knee x-ray [DATE]

CLINICAL DATA: Pain and swelling.

EXAM:
RIGHT KNEE - COMPLETE 4+ VIEW

[knee ap]
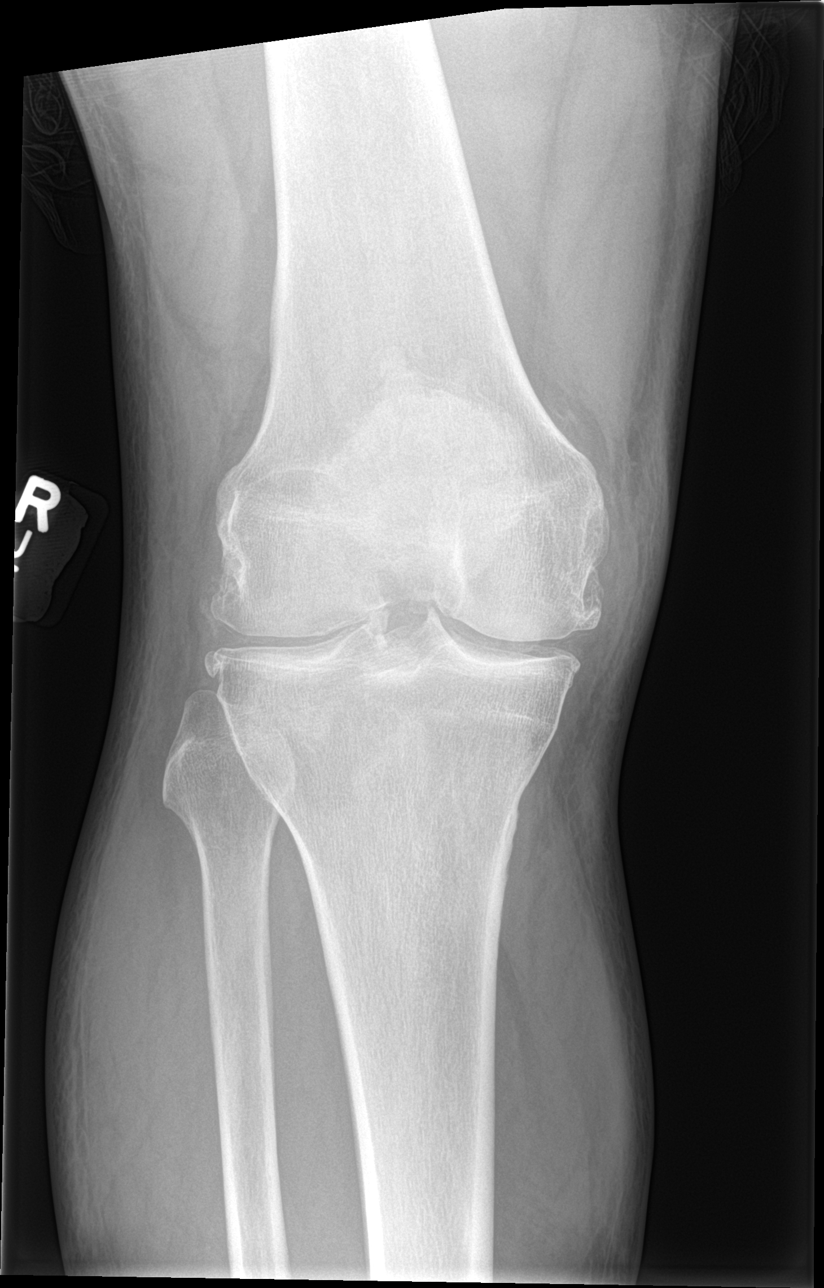

[knee lat]
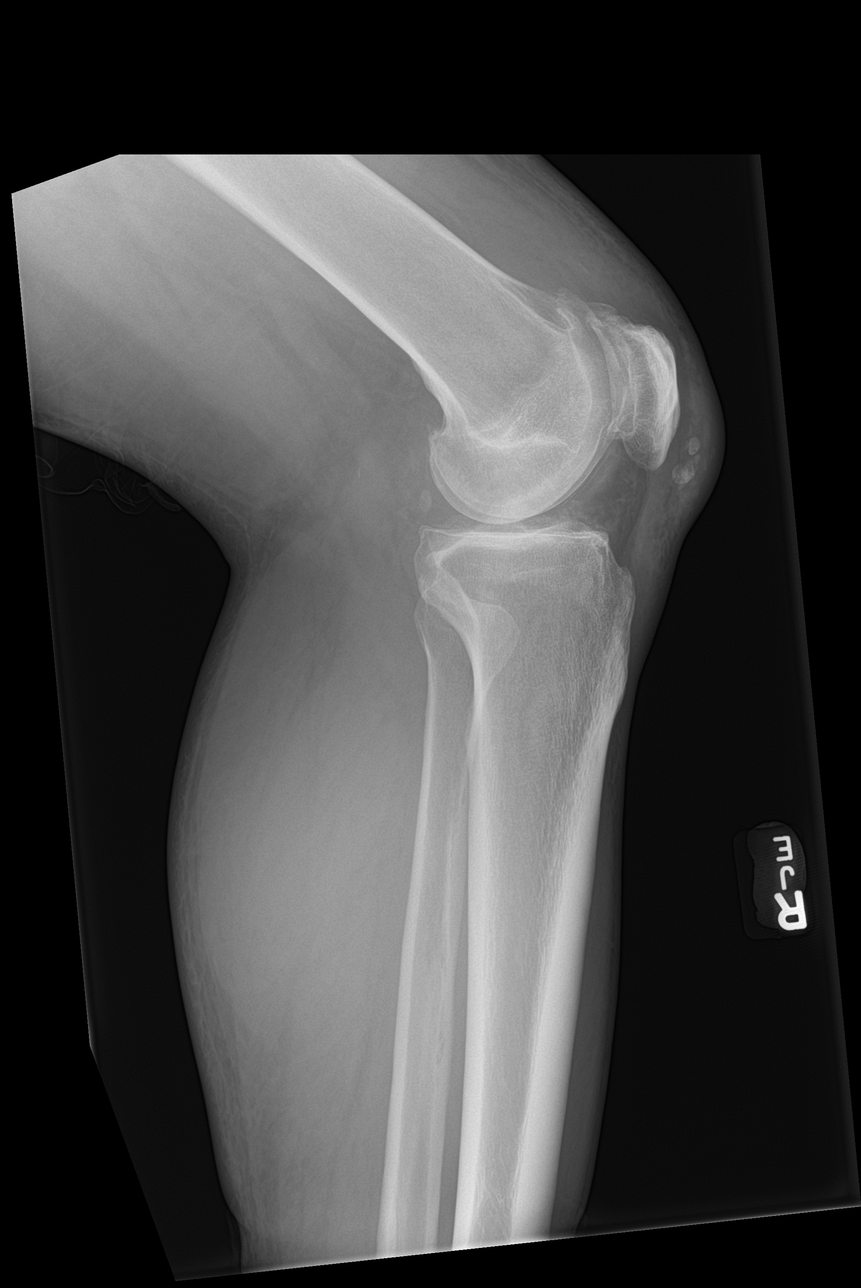

[knee obl (1 of 2)]
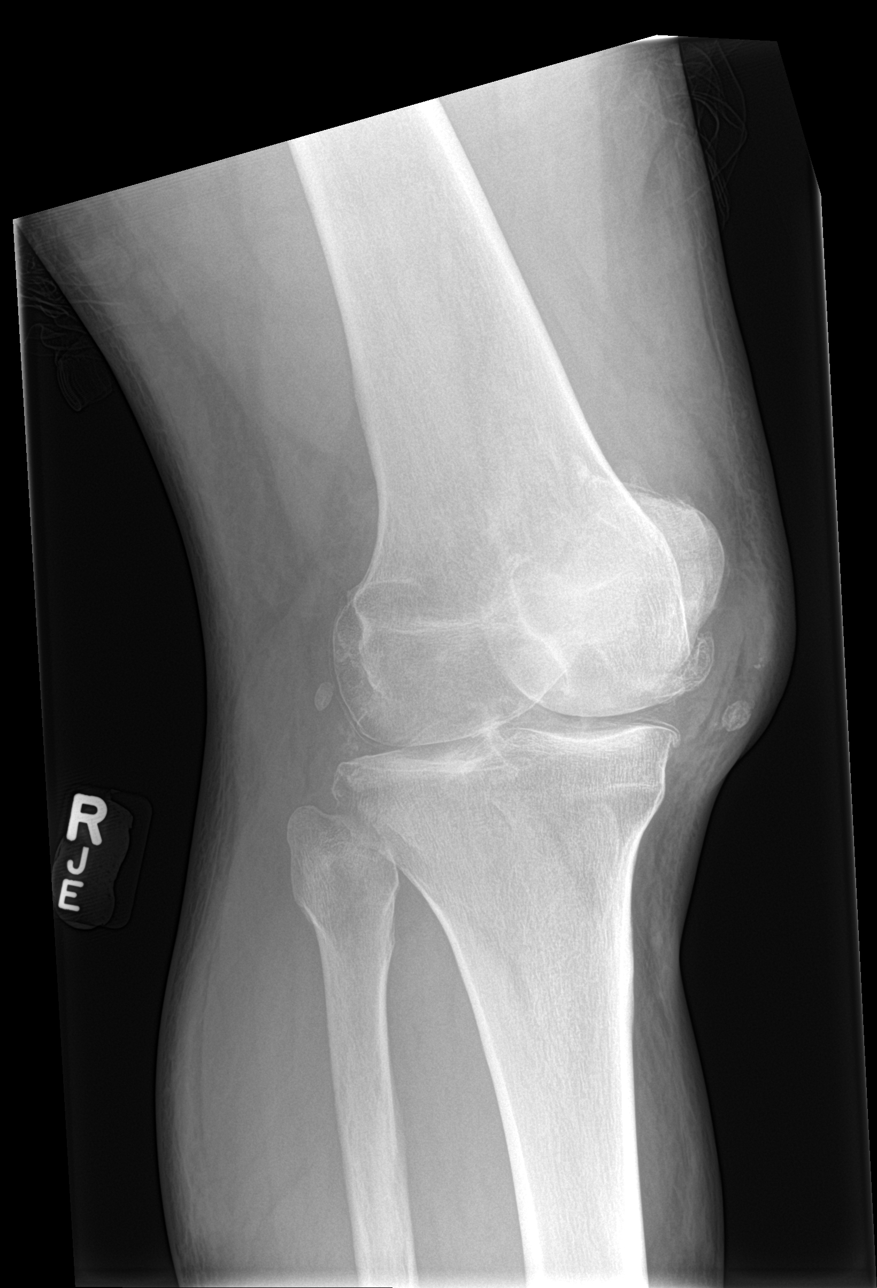

[knee obl (2 of 2)]
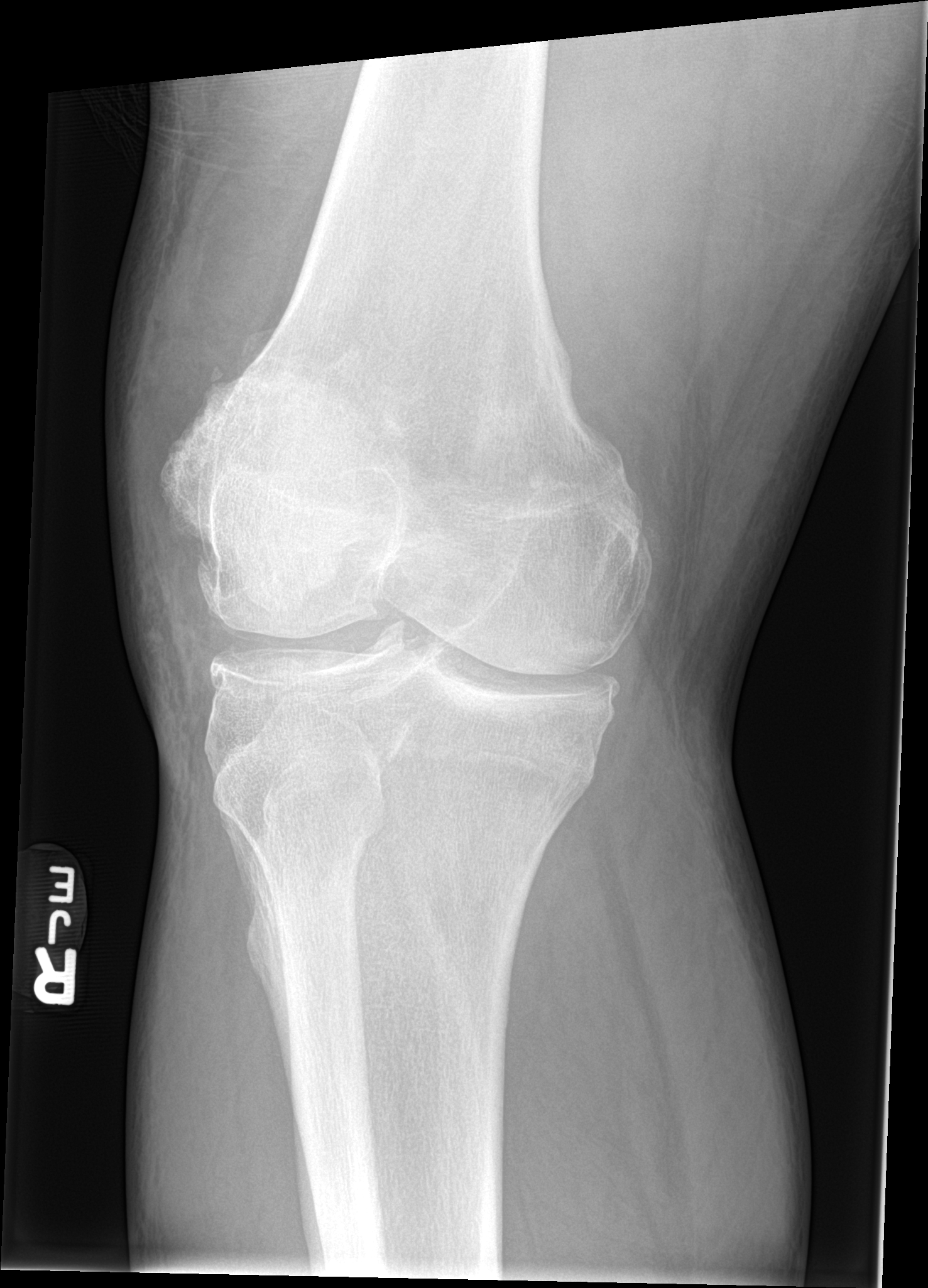

[4 of 4 positions shown; findings below may reference images not displayed]

FINDINGS: Small joint effusion is present. There is new soft tissue swelling
anterior to the patella with some well corticated calcifications.
There is patellofemoral compartment mild joint space narrowing.
There is tricompartmental osteophyte formation which has progressed.
No acute fracture or dislocation.
IMPRESSION: 1. Joint effusion.
2. Anterior knee soft tissue swelling with new small calcifications.
Findings may related to acute or chronic injury.
3. No acute fracture or dislocation.
4. Mild/moderate tricompartmental osteoarthrosis has slightly
progressed.

## 2021-09-25 MED ORDER — MORPHINE SULFATE (PF) 4 MG/ML IV SOLN
4.0000 mg | Freq: Once | INTRAVENOUS | Status: AC
  Administered 2021-09-25: 4 mg via INTRAVENOUS
  Filled 2021-09-25: qty 1

## 2021-09-25 MED ORDER — OXYCODONE-ACETAMINOPHEN 5-325 MG PO TABS
1.0000 | ORAL_TABLET | Freq: Once | ORAL | Status: AC
  Administered 2021-09-25: 23:00:00 1 via ORAL
  Filled 2021-09-25: qty 1

## 2021-09-25 MED ORDER — OXYCODONE-ACETAMINOPHEN 5-325 MG PO TABS: 1.0000 | ORAL_TABLET | Freq: Four times a day (QID) | ORAL | 0 refills | Status: DC | PRN

## 2021-09-25 NOTE — ED Triage Notes (Signed)
Pt c/o right knee pain x 4-5 days-denies injury-to triage in w/c-NAD

## 2021-09-25 NOTE — ED Provider Notes (Signed)
[facility] DEPART[facility] Provider Note   CSN: [id number] Arrival date & time: 09/25/21  2127     History Chief Complaint  Patient presents with   Knee Pain    Matthew Fisher is a 50 y.o. male hx of CKD, gout, DM, knee arthritis s/p multiple knee surgeries, here with R knee pain and swelling.  Patient has right knee pain and swelling for the last 4 to 5 days.  Patient denies any trauma or injury.  Patient states that there is pain in the back of his leg as well.  He states that he takes allopurinol for gout and this feels like a gout flare.  Denies any fevers or chills.  Denies any chest pain or shortness of breath  The history is provided by the patient.      Past Medical History:  Diagnosis Date   Arthritis    CAD (coronary artery disease)    a. NSTEMI 10/2017 s/p multivessel PCI of the OM1, PDA, PLA ostium and mid PLA.   CKD (chronic kidney disease), stage II    Gout    Hypertension    Insulin dependent diabetes mellitus     Patient Active Problem List   Diagnosis Date Noted   CHF (congestive heart failure) ([place]) 09/11/2019   Suspected congestive heart failure 09/10/2019   Nonadherence to medical treatment    Tobacco abuse disorder    CAD (coronary artery disease) 10/18/2017   CKD (chronic kidney disease), stage II 10/18/2017   NSTEMI (non-ST elevated myocardial infarction) ([address]) 10/15/2017   Insulin dependent diabetes mellitus 10/15/2017   Hypertension 10/15/2017   Gastroparesis due to secondary diabetes ([place]) 10/15/2017   Hypertensive emergency, no CHF    Medial meniscus tear 01/22/2013    Past Surgical History:  Procedure Laterality Date   CORONARY STENT INTERVENTION N/A 10/17/2017   Procedure: CORONARY STENT INTERVENTION;  Surgeon: Jettie Booze, MD;  Location: [facility];  Service: Cardiovascular;  Laterality: N/A;   FRACTURE SURGERY     right ring finger   KNEE ARTHROSCOPY WITH LATERAL MENISECTOMY Left 01/22/2013    Procedure: LEFT KNEE ARTHROSCOPY DEBRIDEMENT CHONDROPLASTY, LATERAL RELEASE AND partial medial meniscectomy;  Surgeon: Johnn Hai, MD;  Location: [facility];  Service: Orthopedics;  Laterality: Left;   KNEE SURGERY     "removed bone"   LEFT HEART CATH N/A 10/17/2017   Procedure: Left Heart Cath;  Surgeon: Jettie Booze, MD;  Location: [facility];  Service: Cardiovascular;  Laterality: N/A;   LEFT HEART CATH AND CORONARY ANGIOGRAPHY N/A 10/15/2017   Procedure: LEFT HEART CATH AND CORONARY ANGIOGRAPHY;  Surgeon: Leonie Man, MD;  Location: [facility];  Service: Cardiovascular;  Laterality: N/A;   SHOULDER SURGERY         Family History  Problem Relation Age of Onset   Diabetes Mother    Cancer Father     Social History   Tobacco Use   Smoking status: Former    Packs/day: 0.50    Years: 20.00    Pack years: 10.00    Types: Cigarettes, Cigars    Quit date: 10/08/2002    Years since quitting: 18.9   Smokeless tobacco: Never  Vaping Use   Vaping Use: Never used  Substance Use Topics   Alcohol use: Yes    Comment: occasional   Drug use: No    Home Medications Prior to Admission medications   Medication Sig Start Date End Date Taking? Authorizing Provider  acetaminophen (TYLENOL) 325 MG tablet Take 2 tablets (650 mg total) by mouth every 4 (four) hours as needed for headache or mild pain. 09/12/19   Swayze, Ava, DO  acetaminophen (TYLENOL) 500 MG tablet Take 2 tablets (1,000 mg total) by mouth every 6 (six) hours as needed. 10/13/20   Charlesetta Shanks, MD  amLODipine (NORVASC) 10 MG tablet Take 1 tablet (10 mg total) by mouth daily. 10/28/17   Lendon Colonel, NP  aspirin 81 MG chewable tablet Chew 1 tablet (81 mg total) by mouth daily. 10/20/17   Dunn, Areta Haber, PA-C  atorvastatin (LIPITOR) 80 MG tablet Take 1 tablet (80 mg total) by mouth daily at 6 PM. 10/28/17   Lendon Colonel, NP  carvedilol (COREG) 25 MG tablet Take 1 tablet (25 mg total) by mouth 2  (two) times daily with a meal. 10/28/17   Lendon Colonel, NP  clopidogrel (PLAVIX) 75 MG tablet Take 1 tablet (75 mg total) by mouth daily. 10/28/17   Lendon Colonel, NP  diclofenac Sodium (VOLTAREN) 1 % GEL Apply 2 g topically 4 (four) times daily as needed (leg pain). 11/11/20   Long, Wonda Olds, MD  ferrous sulfate 325 (65 FE) MG tablet Take 325 mg by mouth every morning. 08/06/19   [provider]  furosemide (LASIX) 40 MG tablet Take 1 tablet (40 mg total) by mouth daily. 09/12/19 09/11/20  Swayze, Ava, DO  HYDROcodone-acetaminophen (NORCO/VICODIN) 5-325 MG tablet Take 2 tablets by mouth every 4 (four) hours as needed. 12/15/20   Brimage, Ronnette Juniper, DO  insulin detemir (LEVEMIR) 100 UNIT/ML injection Inject 20 Units into the skin daily.    [provider]  methocarbamol (ROBAXIN) 500 MG tablet Take 1 tablet (500 mg total) by mouth every 6 (six) hours as needed for muscle spasms. 10/13/20   Charlesetta Shanks, MD  ondansetron (ZOFRAN-ODT) 4 MG disintegrating tablet Take 1 tablet (4 mg total) by mouth every 8 (eight) hours as needed for nausea or vomiting. 09/05/21   Horton, Barbette Hair, MD  oseltamivir (TAMIFLU) 75 MG capsule Take 1 capsule (75 mg total) by mouth every 12 (twelve) hours. 09/05/21   Horton, Barbette Hair, MD  polyethylene glycol (MIRALAX) 17 g packet Take 17 g by mouth daily. Patient taking differently: Take 17 g by mouth daily as needed for mild constipation. 07/04/19   Palumbo, April, MD  potassium chloride 20 MEQ TBCR Take 20 mEq by mouth daily. 09/12/19   Swayze, Ava, DO  PROTONIX 40 MG tablet Take 40 mg by mouth 2 (two) times daily. 10/10/17   [provider]  Vitamin D, Ergocalciferol, (DRISDOL) 1.25 MG (50000 UT) CAPS capsule Take by mouth every 7 (seven) days.  11/27/18   [provider]    Allergies    Patient has no known allergies.  Review of Systems   Review of Systems  Musculoskeletal:        Right knee pain  All other systems reviewed and  are negative.  Physical Exam Updated Vital Signs BP (!) 144/85 (BP Location: Right Arm)    Pulse 84    Temp 98.4 F (36.9 C) (Oral)    Resp 18    Ht 5\' 8"  (1.727 m)    Wt 96.2 kg    SpO2 99%    BMI 32.23 kg/m   Physical Exam Vitals and nursing note reviewed.  Constitutional:      Appearance: Normal appearance.     Comments: Uncomfortable  HENT:     Head:  Normocephalic.     Nose: Nose normal.     Mouth/Throat:     Mouth: Mucous membranes are moist.  Eyes:     Extraocular Movements: Extraocular movements intact.     Pupils: Pupils are equal, round, and reactive to light.  Cardiovascular:     Rate and Rhythm: Normal rate and regular rhythm.     Pulses: Normal pulses.     Heart sounds: Normal heart sounds.  Pulmonary:     Effort: Pulmonary effort is normal.     Breath sounds: Normal breath sounds.  Abdominal:     General: Abdomen is flat.     Palpations: Abdomen is soft.  Musculoskeletal:     Cervical back: Normal range of motion and neck supple.     Comments: Right knee is slightly swollen and appears to have an effusion.  Patient is able to range the right knee.  The knee is not red or hot.  There is no obvious cellulitis.  Patient does have some calf tenderness and tenderness behind the right knee.  2+ DP pulse  Skin:    General: Skin is warm.     Capillary Refill: Capillary refill takes less than 2 seconds.  Neurological:     General: No focal deficit present.     Mental Status: He is alert and oriented to person, place, and time.  Psychiatric:        Mood and Affect: Mood normal.        Behavior: Behavior normal.    ED Results / Procedures / Treatments   Labs (all labs ordered are listed, but only abnormal results are displayed) Labs Reviewed  CBC WITH DIFFERENTIAL/PLATELET - Abnormal; Notable for the following components:      Result Value   RBC 3.01 (*)    Hemoglobin 9.7 (*)    HCT 26.2 (*)    MCHC 37.0 (*)    All other components within normal limits  BASIC  METABOLIC PANEL  URIC ACID    EKG None  Radiology US Venous Img Lower Unilateral Right  Result Date: 09/25/2021 CLINICAL DATA:  Right lower extremity pain. EXAM: RIGHT LOWER EXTREMITY VENOUS DOPPLER ULTRASOUND TECHNIQUE: Gray-scale sonography with compression, as well as color and duplex ultrasound, were performed to evaluate the deep venous system(s) from the level of the common femoral vein through the popliteal and proximal calf veins. COMPARISON:  None. FINDINGS: VENOUS Normal compressibility of the common femoral, superficial femoral, and popliteal veins, as well as the visualized calf veins. Visualized portions of profunda femoral vein and great saphenous vein unremarkable. No filling defects to suggest DVT on grayscale or color Doppler imaging. Doppler waveforms show normal direction of venous flow, normal respiratory plasticity and response to augmentation. Limited views of the contralateral common femoral vein are unremarkable. OTHER None. Limitations: none IMPRESSION: No evidence of DVT within the RIGHT lower extremity. Electronically Signed   By: Virgina Norfolk M.D.   On: 09/25/2021 22:56   DG Knee Complete 4 Views Right  Result Date: 09/25/2021 CLINICAL DATA:  Pain and swelling. EXAM: RIGHT KNEE - COMPLETE 4+ VIEW COMPARISON:  Right knee x-ray 11/11/2019 FINDINGS: Small joint effusion is present. There is new soft tissue swelling anterior to the patella with some well corticated calcifications. There is patellofemoral compartment mild joint space narrowing. There is tricompartmental osteophyte formation which has progressed. No acute fracture or dislocation. IMPRESSION: 1. Joint effusion. 2. Anterior knee soft tissue swelling with new small calcifications. Findings may related to acute or chronic  injury. 3. No acute fracture or dislocation. 4. Mild/moderate tricompartmental osteoarthrosis has slightly progressed. Electronically Signed   By: Darliss Cheney M.D.   On: 09/25/2021 22:42     Procedures Procedures   Medications Ordered in ED Medications  oxyCODONE-acetaminophen (PERCOCET/ROXICET) 5-325 MG per tablet 1 tablet (1 tablet Oral Given 09/25/21 2310)    ED Course  I have reviewed the triage vital signs and the nursing notes.  Pertinent labs & imaging results that were available during my care of the patient were reviewed by me and considered in my medical decision making (see chart for details).    MDM Rules/Calculators/A&P                         Deante Blough is a 50 y.o. male who presenting with right knee pain.  I think likely gout exacerbation.  However given that he has pain behind his calf, will get DVT study.  We will get x-ray and CBC and BMP and uric acid level  11:36 PM DVT negative. Xray showed arthritis and joint effusion. CBC unremarkable. BMP and uric acid pending.  Signed out to Dr. Jodi Mourning in the ED to follow-up labs and reassess patient.  Anticipate discharge home.  Patient has seen [facility] previously regarding his knee.     Final Clinical Impression(s) / ED Diagnoses Final diagnoses:  None    Rx / DC Orders ED Discharge Orders     None        Charlynne Pander, MD 09/25/21 2337

## 2021-09-26 MED ORDER — PREDNISONE 10 MG PO TABS: 20.0000 mg | ORAL_TABLET | Freq: Every day | ORAL | 0 refills | Status: AC

## 2021-09-26 MED ORDER — PREDNISONE 10 MG PO TABS: 30.0000 mg | ORAL_TABLET | Freq: Every day | ORAL | 0 refills | Status: DC

## 2021-09-26 NOTE — ED Provider Notes (Signed)
Patient care signed out to follow-up blood work results and discharge with likely gout.  Patient presents with right knee effusion without new injury.  5 days without fever.  Feels similar to his gout flare.  Exam eyes patient has mild tenderness and mild right knee effusion.  Blood work ordered and reviewed showing mild worsening creatinine 2.36 compared to previous, glucose mild elevated 190 patient is nondiabetic, normal white blood cell count and mild anemia.  Plan for short course of prednisone low-dose reminded patient to monitor sugars closely as this will elevate, pain meds sent in by previous physician.  Patient stable for close outpatient follow-up and recheck in the office.  Labs Reviewed  CBC WITH DIFFERENTIAL/PLATELET - Abnormal; Notable for the following components:      Result Value   RBC 3.01 (*)    Hemoglobin 9.7 (*)    HCT 26.2 (*)    MCHC 37.0 (*)    All other components within normal limits  BASIC METABOLIC PANEL - Abnormal; Notable for the following components:   Sodium 133 (*)    Glucose, Bld 190 (*)    BUN 30 (*)    Creatinine, Ser 2.36 (*)    Calcium 8.6 (*)    GFR, Estimated 32 (*)    All other components within normal limits  URIC ACID      Blane Ohara, MD 09/26/21 [phone]

## 2021-09-26 NOTE — Discharge Instructions (Signed)
Take prednisone for join swelling however it will increase your blood sugars so monitor closely. For severe pain take oxycodone however realize they have the potential for addiction and it can make you sleepy and has tylenol in it.  No operating machinery while taking.

## 2021-09-28 ENCOUNTER — Encounter: Payer: Self-pay | Admitting: Family Medicine

## 2021-09-28 ENCOUNTER — Ambulatory Visit (INDEPENDENT_AMBULATORY_CARE_PROVIDER_SITE_OTHER): Payer: Self-pay | Admitting: Family Medicine

## 2021-09-28 ENCOUNTER — Ambulatory Visit: Payer: Self-pay

## 2021-09-28 VITALS — BP 138/94 | Ht 68.0 in | Wt 212.0 lb

## 2021-09-28 DIAGNOSIS — M10362 Gout due to renal impairment, left knee: Secondary | ICD-10-CM | POA: Insufficient documentation

## 2021-09-28 DIAGNOSIS — M25561 Pain in right knee: Secondary | ICD-10-CM

## 2021-09-28 DIAGNOSIS — M659 Synovitis and tenosynovitis, unspecified: Secondary | ICD-10-CM

## 2021-09-28 DIAGNOSIS — M10361 Gout due to renal impairment, right knee: Secondary | ICD-10-CM

## 2021-09-28 LAB — SYNOVIAL FLUID ANALYSIS, COMPLETE
Basophils, %: 0 %
Eosinophils-Synovial: 0 % (ref 0–2)
Lymphocytes-Synovial Fld: 11 % (ref 0–74)
Monocyte/Macrophage: 4 % (ref 0–69)
Neutrophil, Synovial: 85 % — ABNORMAL HIGH (ref 0–24)
Synoviocytes, %: 0 % (ref 0–15)
WBC, Synovial: 5125 cells/uL — ABNORMAL HIGH (ref <150)

## 2021-09-28 MED ORDER — TRIAMCINOLONE ACETONIDE 40 MG/ML IJ SUSP
40.0000 mg | Freq: Once | INTRAMUSCULAR | Status: AC
  Administered 2021-09-28: 12:00:00 40 mg via INTRA_ARTICULAR

## 2021-09-28 NOTE — Patient Instructions (Signed)
Nice to meet you Please use ice  I will call when the results are back  Please use the prednisone and watch your sugar if the pain doesn't go completely away with the injection   Please send me a message in MyChart with any questions or updates.  Please see me back in 1-2 weeks.   --Dr. Jordan Likes

## 2021-09-28 NOTE — Progress Notes (Signed)
Matthew Fisher - 50 y.o. male MRN [id number]  Date of birth: [birth year]  SUBJECTIVE:  Including CC & ROS.  No chief complaint on file.   Matthew Fisher is a 50 y.o. male that is presenting with acute on chronic right knee pain.  The pain is severe in nature.  It is similar to his previous gout attacks.  Has been taking Norco but has not started the prednisone.  Independent review of the right knee x-ray from 12/19 shows a joint effusion with calcification of the soft tissue anteriorly.   Review of Systems See HPI   HISTORY: Past Medical, Surgical, Social, and Family History Reviewed & Updated per EMR.   Pertinent Historical Findings include:  Past Medical History:  Diagnosis Date   Arthritis    CAD (coronary artery disease)    a. NSTEMI 10/2017 s/p multivessel PCI of the OM1, PDA, PLA ostium and mid PLA.   CKD (chronic kidney disease), stage II    Gout    Hypertension    Insulin dependent diabetes mellitus     Past Surgical History:  Procedure Laterality Date   CORONARY STENT INTERVENTION N/A 10/17/2017   Procedure: CORONARY STENT INTERVENTION;  Surgeon: Jettie Booze, MD;  Location: [facility];  Service: Cardiovascular;  Laterality: N/A;   FRACTURE SURGERY     right ring finger   KNEE ARTHROSCOPY WITH LATERAL MENISECTOMY Left 01/22/2013   Procedure: LEFT KNEE ARTHROSCOPY DEBRIDEMENT CHONDROPLASTY, LATERAL RELEASE AND partial medial meniscectomy;  Surgeon: Johnn Hai, MD;  Location: [facility];  Service: Orthopedics;  Laterality: Left;   KNEE SURGERY     "removed bone"   LEFT HEART CATH N/A 10/17/2017   Procedure: Left Heart Cath;  Surgeon: Jettie Booze, MD;  Location: [facility];  Service: Cardiovascular;  Laterality: N/A;   LEFT HEART CATH AND CORONARY ANGIOGRAPHY N/A 10/15/2017   Procedure: LEFT HEART CATH AND CORONARY ANGIOGRAPHY;  Surgeon: Leonie Man, MD;  Location: [facility];  Service: Cardiovascular;  Laterality: N/A;    SHOULDER SURGERY      Family History  Problem Relation Age of Onset   Diabetes Mother    Cancer Father     Social History   Socioeconomic History   Marital status: Married    Spouse name: Not on file   Number of children: Not on file   Years of education: Not on file   Highest education level: Not on file  Occupational History   Not on file  Tobacco Use   Smoking status: Former    Packs/day: 0.50    Years: 20.00    Pack years: 10.00    Types: Cigarettes, Cigars    Quit date: 10/08/2002    Years since quitting: 18.9   Smokeless tobacco: Never  Vaping Use   Vaping Use: Never used  Substance and Sexual Activity   Alcohol use: Yes    Comment: occasional   Drug use: No   Sexual activity: Yes  Other Topics Concern   Not on file  Social History Narrative   Not on file   Social Determinants of Health   Financial Resource Strain: Not on file  Food Insecurity: Not on file  Transportation Needs: Not on file  Physical Activity: Not on file  Stress: Not on file  Social Connections: Not on file  Intimate Partner Violence: Not on file     PHYSICAL EXAM:  VS: BP (!) 138/94 (BP Location: Left Arm, Patient Position: Sitting)  Ht 5\' 8"  (1.727 m)    Wt 212 lb (96.2 kg)    BMI 32.23 kg/m  Physical Exam Gen: NAD, alert, cooperative with exam, well-appearing  Limited ultrasound: Right knee:  Large effusion in suprapatellar pouch. Increased hyperemia of the quadricep and patellar tendon. Increased hyperemia throughout the synovium. Degenerative change of the medial joint space and lateral joint space  Summary: Inflammatory changes and synovitis  Ultrasound and interpretation by , MD    Aspiration/Injection Procedure Note Osbaldo Mark Jan 15, 1971  Procedure: Aspiration and Injection Indications: Right knee pain  Procedure Details Consent: Risks of procedure as well as the alternatives and risks of each were explained to the (patient/caregiver).   Consent for procedure obtained. Time Out: Verified patient identification, verified procedure, site/side was marked, verified correct patient position, special equipment/implants available, medications/allergies/relevent history reviewed, required imaging and test results available.  Performed.  The area was cleaned with iodine and alcohol swabs.    The right knee superior lateral suprapatellar pouch was injected using 3 cc 1% lidocaine on a 25-gauge 1-1/2 inch needle.  An 18-gauge 1-1/2 inch needle was used to achieve aspiration.  The syringe was switched containing 1 cc's of 40 mg Kenalog and 4 cc's of 0.25% bupivacaine was injected.  Ultrasound was used. Images were obtained in long views showing the injection.    Amount of Fluid Aspirated:  53mL Character of Fluid: straw colored and cloudy Fluid was sent 77m count and crystal identification  A sterile dressing was applied.  Patient did tolerate procedure well.    ASSESSMENT & PLAN:   Acute gout due to renal impairment involving right knee Acute on chronic in nature.  Has inflammatory changes of the synovium as well as the quadricep and patellar tendon.  Creatinine clearance calculated at 17.  Most recent uric acid was found to be 6.6. -Counseled on supportive care. -Counseled on using the prednisone after the injection if still having pain. -Check ANA, sed rate, CRP, iron and ferritin and peripheral blood smear.  Synovitis of right knee Inflammatory changes appreciated on exam today.  Does have a history of gout. -Counseled on home exercise therapy and supportive care. -Aspiration and injection. -Synovial fluid analysis.

## 2021-09-28 NOTE — Assessment & Plan Note (Signed)
Inflammatory changes appreciated on exam today.  Does have a history of gout. -Counseled on home exercise therapy and supportive care. -Aspiration and injection. -Synovial fluid analysis.

## 2021-09-28 NOTE — Assessment & Plan Note (Signed)
Acute on chronic in nature.  Has inflammatory changes of the synovium as well as the quadricep and patellar tendon.  Creatinine clearance calculated at 17.  Most recent uric acid was found to be 6.6. -Counseled on supportive care. -Counseled on using the prednisone after the injection if still having pain. -Check ANA, sed rate, CRP, iron and ferritin and peripheral blood smear.

## 2021-10-03 LAB — PATHOLOGIST SMEAR REVIEW
Basophils Absolute: 0 10*3/uL (ref 0.0–0.2)
Basos: 0 %
EOS (ABSOLUTE): 0.1 10*3/uL (ref 0.0–0.4)
Eos: 1 %
Hematocrit: 27.9 % — ABNORMAL LOW (ref 37.5–51.0)
Hemoglobin: 9.8 g/dL — ABNORMAL LOW (ref 13.0–17.7)
Immature Grans (Abs): 0 10*3/uL (ref 0.0–0.1)
Immature Granulocytes: 0 %
Lymphocytes Absolute: 1.5 10*3/uL (ref 0.7–3.1)
Lymphs: 23 %
MCH: 31.1 pg (ref 26.6–33.0)
MCHC: 35.1 g/dL (ref 31.5–35.7)
MCV: 89 fL (ref 79–97)
Monocytes Absolute: 0.9 10*3/uL (ref 0.1–0.9)
Monocytes: 13 %
Neutrophils Absolute: 4.2 10*3/uL (ref 1.4–7.0)
Neutrophils: 63 %
Platelets: 199 10*3/uL (ref 150–450)
RBC: 3.15 x10E6/uL — ABNORMAL LOW (ref 4.14–5.80)
RDW: 12.2 % (ref 11.6–15.4)
WBC: 6.7 10*3/uL (ref 3.4–10.8)

## 2021-10-03 LAB — IRON,TIBC AND FERRITIN PANEL
Ferritin: 336 ng/mL (ref 30–400)
Iron Saturation: 10 % — ABNORMAL LOW (ref 15–55)
Iron: 27 ug/dL — ABNORMAL LOW (ref 38–169)
Total Iron Binding Capacity: 283 ug/dL (ref 250–450)
UIBC: 256 ug/dL (ref 111–343)

## 2021-10-03 LAB — C-REACTIVE PROTEIN: CRP: 26 mg/L — ABNORMAL HIGH (ref 0–10)

## 2021-10-03 LAB — ANA,IFA RA DIAG PNL W/RFLX TIT/PATN
ANA Titer 1: NEGATIVE
Cyclic Citrullin Peptide Ab: <1 units (ref 0–19)
Rheumatoid fact SerPl-aCnc: <10 IU/mL (ref <14.0)

## 2021-10-03 LAB — SEDIMENTATION RATE: Sed Rate: 82 mm/hr — ABNORMAL HIGH (ref 0–30)

## 2021-10-04 ENCOUNTER — Telehealth: Payer: Self-pay | Admitting: Family Medicine

## 2021-10-04 MED ORDER — PREDNISONE 5 MG PO TABS: ORAL_TABLET | ORAL | 0 refills | Status: DC

## 2021-10-04 NOTE — Telephone Encounter (Signed)
Informed of results. Will provide prednisone if he needs it.   Myra Rude, MD [facility] 10/04/2021, 9:10 AM

## 2021-10-10 ENCOUNTER — Encounter: Payer: Self-pay | Admitting: Family Medicine

## 2021-10-10 ENCOUNTER — Ambulatory Visit (INDEPENDENT_AMBULATORY_CARE_PROVIDER_SITE_OTHER): Payer: Self-pay | Admitting: Family Medicine

## 2021-10-10 ENCOUNTER — Ambulatory Visit: Payer: Self-pay

## 2021-10-10 VITALS — BP 138/78 | Ht 68.0 in | Wt 212.0 lb

## 2021-10-10 DIAGNOSIS — M10362 Gout due to renal impairment, left knee: Secondary | ICD-10-CM

## 2021-10-10 DIAGNOSIS — M659 Synovitis and tenosynovitis, unspecified: Secondary | ICD-10-CM

## 2021-10-10 MED ORDER — KETOROLAC TROMETHAMINE 30 MG/ML IJ SOLN
30.0000 mg | Freq: Once | INTRAMUSCULAR | Status: AC
  Administered 2021-10-10: 30 mg via INTRA_ARTICULAR

## 2021-10-10 NOTE — Progress Notes (Addendum)
Raymund Breitenbach - 51 y.o. male MRN [id number]  Date of birth: 1970-12-20  SUBJECTIVE:  Including CC & ROS.  No chief complaint on file.   Kaelan Mandala is a 51 y.o. male that is presenting with acutely worsening of his bilateral knee pain. Pain is severe and localized to the knee. Has not taken the prednisone.   Review of Systems See HPI   HISTORY: Past Medical, Surgical, Social, and Family History Reviewed & Updated per EMR.   Pertinent Historical Findings include:  Past Medical History:  Diagnosis Date   Arthritis    CAD (coronary artery disease)    a. NSTEMI 10/2017 s/p multivessel PCI of the OM1, PDA, PLA ostium and mid PLA.   CKD (chronic kidney disease), stage II    Gout    Hypertension    Insulin dependent diabetes mellitus     Past Surgical History:  Procedure Laterality Date   CORONARY STENT INTERVENTION N/A 10/17/2017   Procedure: CORONARY STENT INTERVENTION;  Surgeon: Jettie Booze, MD;  Location: [facility];  Service: Cardiovascular;  Laterality: N/A;   FRACTURE SURGERY     right ring finger   KNEE ARTHROSCOPY WITH LATERAL MENISECTOMY Left 01/22/2013   Procedure: LEFT KNEE ARTHROSCOPY DEBRIDEMENT CHONDROPLASTY, LATERAL RELEASE AND partial medial meniscectomy;  Surgeon: Johnn Hai, MD;  Location: [facility];  Service: Orthopedics;  Laterality: Left;   KNEE SURGERY     "removed bone"   LEFT HEART CATH N/A 10/17/2017   Procedure: Left Heart Cath;  Surgeon: Jettie Booze, MD;  Location: [facility];  Service: Cardiovascular;  Laterality: N/A;   LEFT HEART CATH AND CORONARY ANGIOGRAPHY N/A 10/15/2017   Procedure: LEFT HEART CATH AND CORONARY ANGIOGRAPHY;  Surgeon: Leonie Man, MD;  Location: [facility];  Service: Cardiovascular;  Laterality: N/A;   SHOULDER SURGERY      Family History  Problem Relation Age of Onset   Diabetes Mother    Cancer Father     Social History   Socioeconomic History   Marital status: Married     Spouse name: Not on file   Number of children: Not on file   Years of education: Not on file   Highest education level: Not on file  Occupational History   Not on file  Tobacco Use   Smoking status: Former    Packs/day: 0.50    Years: 20.00    Pack years: 10.00    Types: Cigarettes, Cigars    Quit date: 10/08/2002    Years since quitting: 19.0   Smokeless tobacco: Never  Vaping Use   Vaping Use: Never used  Substance and Sexual Activity   Alcohol use: Yes    Comment: occasional   Drug use: No   Sexual activity: Yes  Other Topics Concern   Not on file  Social History Narrative   Not on file   Social Determinants of Health   Financial Resource Strain: Not on file  Food Insecurity: Not on file  Transportation Needs: Not on file  Physical Activity: Not on file  Stress: Not on file  Social Connections: Not on file  Intimate Partner Violence: Not on file     PHYSICAL EXAM:  VS: BP 138/78 (BP Location: Left Arm, Patient Position: Sitting)    Ht 5\' 8"  (1.727 m)    Wt 212 lb (96.2 kg)    BMI 32.23 kg/m  Physical Exam Gen: NAD, alert, cooperative with exam, well-appearing   Aspiration/Injection Procedure  Note Dvon Kudelka 11-20-1970  Procedure: Injection Indications: Left knee pain  Procedure Details Consent: Risks of procedure as well as the alternatives and risks of each were explained to the (patient/caregiver).  Consent for procedure obtained. Time Out: Verified patient identification, verified procedure, site/side was marked, verified correct patient position, special equipment/implants available, medications/allergies/relevent history reviewed, required imaging and test results available.  Performed.  The area was cleaned with iodine and alcohol swabs.    The left knee superior lateral suprapatellar pouch was injected using 1 cc's of 30 mg Toradol and 4 cc's of 0.25% bupivacaine with a 25 1 1/2" needle.  Ultrasound was used. Images were obtained in long views  showing the injection.     A sterile dressing was applied.  Patient did tolerate procedure well.  Aspiration/Injection Procedure Note Sherrod Hatchell Jan 17, 1971  Procedure: Injection Indications: Right knee pain  Procedure Details Consent: Risks of procedure as well as the alternatives and risks of each were explained to the (patient/caregiver).  Consent for procedure obtained. Time Out: Verified patient identification, verified procedure, site/side was marked, verified correct patient position, special equipment/implants available, medications/allergies/relevent history reviewed, required imaging and test results available.  Performed.  The area was cleaned with iodine and alcohol swabs.    The right knee superior lateral suprapatellar pouch was injected using 1 cc's of 30 mg Toradol and 4 cc's of 0.25% bupivacaine with a 25 1 1/2" needle.  Ultrasound was used. Images were obtained in long views showing the injection.     A sterile dressing was applied.  Patient did tolerate procedure well.  ASSESSMENT & PLAN:   Acute gout due to renal impairment involving left knee Acute on chronic in nature. Does have a component of degenerative changes - counseled on home exercise therapy and supportive care - toradol injection    Synovitis of right knee Acutely worsening.  - counseled on home exercise therapy and supportive care - toradol injection intra-articular  - may need to consider surgery if no improvement.

## 2021-10-10 NOTE — Patient Instructions (Signed)
Good to see you Please use ice as needed Please start the prednisone if pain is ongoing   Please send me a message in MyChart with any questions or updates.  Please see me back in 4 weeks.   --Dr. Jordan Likes

## 2021-10-10 NOTE — Assessment & Plan Note (Signed)
Acutely worsening.  - counseled on home exercise therapy and supportive care - toradol injection intra-articular  - may need to consider surgery if no improvement.

## 2021-10-10 NOTE — Assessment & Plan Note (Signed)
Acute on chronic in nature. Does have a component of degenerative changes - counseled on home exercise therapy and supportive care - toradol injection

## 2021-10-10 NOTE — Addendum Note (Signed)
Addended by: Merrilyn Puma on: 10/10/2021 01:11 PM   Modules accepted: Orders

## 2021-10-12 ENCOUNTER — Ambulatory Visit: Payer: Self-pay | Admitting: Family Medicine

## 2021-11-09 DIAGNOSIS — E113592 Type 2 diabetes mellitus with proliferative diabetic retinopathy without macular edema, left eye: Secondary | ICD-10-CM | POA: Diagnosis not present

## 2021-11-09 DIAGNOSIS — Z961 Presence of intraocular lens: Secondary | ICD-10-CM | POA: Diagnosis not present

## 2021-11-09 DIAGNOSIS — H35372 Puckering of macula, left eye: Secondary | ICD-10-CM | POA: Diagnosis not present

## 2021-11-09 DIAGNOSIS — E113511 Type 2 diabetes mellitus with proliferative diabetic retinopathy with macular edema, right eye: Secondary | ICD-10-CM | POA: Diagnosis not present

## 2021-11-09 DIAGNOSIS — E113513 Type 2 diabetes mellitus with proliferative diabetic retinopathy with macular edema, bilateral: Secondary | ICD-10-CM | POA: Diagnosis not present

## 2021-11-09 DIAGNOSIS — E1136 Type 2 diabetes mellitus with diabetic cataract: Secondary | ICD-10-CM | POA: Diagnosis not present

## 2021-11-09 DIAGNOSIS — Z794 Long term (current) use of insulin: Secondary | ICD-10-CM | POA: Diagnosis not present

## 2021-11-09 DIAGNOSIS — H2512 Age-related nuclear cataract, left eye: Secondary | ICD-10-CM | POA: Diagnosis not present

## 2021-11-09 DIAGNOSIS — Z7984 Long term (current) use of oral hypoglycemic drugs: Secondary | ICD-10-CM | POA: Diagnosis not present

## 2021-11-09 DIAGNOSIS — H4312 Vitreous hemorrhage, left eye: Secondary | ICD-10-CM | POA: Diagnosis not present

## 2021-11-13 DIAGNOSIS — Z794 Long term (current) use of insulin: Secondary | ICD-10-CM | POA: Diagnosis not present

## 2021-11-13 DIAGNOSIS — E113511 Type 2 diabetes mellitus with proliferative diabetic retinopathy with macular edema, right eye: Secondary | ICD-10-CM | POA: Diagnosis not present

## 2021-11-13 DIAGNOSIS — E113592 Type 2 diabetes mellitus with proliferative diabetic retinopathy without macular edema, left eye: Secondary | ICD-10-CM | POA: Diagnosis not present

## 2021-11-13 DIAGNOSIS — Z7984 Long term (current) use of oral hypoglycemic drugs: Secondary | ICD-10-CM | POA: Diagnosis not present

## 2021-11-13 DIAGNOSIS — H4312 Vitreous hemorrhage, left eye: Secondary | ICD-10-CM | POA: Diagnosis not present

## 2021-11-22 ENCOUNTER — Ambulatory Visit: Payer: Medicare Other | Admitting: Family Medicine

## 2021-11-23 DIAGNOSIS — K648 Other hemorrhoids: Secondary | ICD-10-CM | POA: Diagnosis not present

## 2021-11-23 DIAGNOSIS — Z9889 Other specified postprocedural states: Secondary | ICD-10-CM | POA: Diagnosis not present

## 2021-11-23 DIAGNOSIS — Z86010 Personal history of colonic polyps: Secondary | ICD-10-CM | POA: Diagnosis not present

## 2021-11-23 DIAGNOSIS — K635 Polyp of colon: Secondary | ICD-10-CM | POA: Diagnosis not present

## 2021-11-23 DIAGNOSIS — Z1211 Encounter for screening for malignant neoplasm of colon: Secondary | ICD-10-CM | POA: Diagnosis not present

## 2021-11-23 LAB — HM COLONOSCOPY

## 2021-12-04 ENCOUNTER — Encounter: Payer: Self-pay | Admitting: Family Medicine

## 2021-12-04 ENCOUNTER — Ambulatory Visit (INDEPENDENT_AMBULATORY_CARE_PROVIDER_SITE_OTHER): Payer: Medicare Other | Admitting: Family Medicine

## 2021-12-04 VITALS — BP 145/83 | HR 81 | Resp 20 | Ht 68.0 in | Wt 217.0 lb

## 2021-12-04 DIAGNOSIS — N182 Chronic kidney disease, stage 2 (mild): Secondary | ICD-10-CM

## 2021-12-04 DIAGNOSIS — M1A0690 Idiopathic chronic gout, unspecified knee, without tophus (tophi): Secondary | ICD-10-CM | POA: Diagnosis not present

## 2021-12-04 DIAGNOSIS — E1165 Type 2 diabetes mellitus with hyperglycemia: Secondary | ICD-10-CM

## 2021-12-04 DIAGNOSIS — I509 Heart failure, unspecified: Secondary | ICD-10-CM

## 2021-12-04 DIAGNOSIS — E611 Iron deficiency: Secondary | ICD-10-CM

## 2021-12-04 DIAGNOSIS — I10 Essential (primary) hypertension: Secondary | ICD-10-CM | POA: Diagnosis not present

## 2021-12-04 DIAGNOSIS — E782 Mixed hyperlipidemia: Secondary | ICD-10-CM | POA: Insufficient documentation

## 2021-12-04 DIAGNOSIS — K2100 Gastro-esophageal reflux disease with esophagitis, without bleeding: Secondary | ICD-10-CM | POA: Insufficient documentation

## 2021-12-04 DIAGNOSIS — Z7689 Persons encountering health services in other specified circumstances: Secondary | ICD-10-CM

## 2021-12-04 MED ORDER — SITAGLIPTIN PHOSPHATE 50 MG PO TABS: 50.0000 mg | ORAL_TABLET | Freq: Every day | ORAL | 3 refills | Status: DC

## 2021-12-04 MED ORDER — CARVEDILOL 25 MG PO TABS: 25.0000 mg | ORAL_TABLET | Freq: Two times a day (BID) | ORAL | 5 refills | Status: DC

## 2021-12-04 MED ORDER — AMLODIPINE BESYLATE 10 MG PO TABS: 10.0000 mg | ORAL_TABLET | Freq: Every day | ORAL | 5 refills | Status: DC

## 2021-12-04 MED ORDER — LISINOPRIL 20 MG PO TABS: 20.0000 mg | ORAL_TABLET | Freq: Every day | ORAL | 3 refills | Status: DC

## 2021-12-04 MED ORDER — ALLOPURINOL 100 MG PO TABS: 200.0000 mg | ORAL_TABLET | Freq: Every day | ORAL | 3 refills | Status: DC

## 2021-12-04 MED ORDER — PROTONIX 40 MG PO TBEC: 40.0000 mg | DELAYED_RELEASE_TABLET | Freq: Two times a day (BID) | ORAL | 3 refills | Status: DC

## 2021-12-04 MED ORDER — ATORVASTATIN CALCIUM 80 MG PO TABS: 80.0000 mg | ORAL_TABLET | Freq: Every day | ORAL | 5 refills | Status: DC

## 2021-12-04 MED ORDER — FERROUS SULFATE 325 (65 FE) MG PO TABS: 325.0000 mg | ORAL_TABLET | Freq: Every morning | ORAL | 3 refills | Status: DC

## 2021-12-04 MED ORDER — FUROSEMIDE 40 MG PO TABS: 40.0000 mg | ORAL_TABLET | Freq: Every day | ORAL | 3 refills | Status: DC

## 2021-12-04 NOTE — Assessment & Plan Note (Signed)
Refill placed. Continue lifestyle measures - avoid trigger foods, large meals, lying down immediately after eating.

## 2021-12-04 NOTE — Assessment & Plan Note (Signed)
No signs of fluid overload today. Continue lasix. Cardiology referral placed.

## 2021-12-04 NOTE — Assessment & Plan Note (Signed)
Allopurinol refilled. No complaints today.

## 2021-12-04 NOTE — Assessment & Plan Note (Signed)
HLD PLAN: -Medication management: continue Lipitor -Repeat CMP and lipid panel today -Diet low in saturated fat -Regular exercise - at least 30 minutes, 5 times per week

## 2021-12-04 NOTE — Assessment & Plan Note (Signed)
Blood pressure is at goal for age and co-morbidities.  I recommend continuing current regimen.  In addition they were instructed on the following: °- BP goal <130/80 °- monitor and log blood pressures at home °- check around the same time each day in a relaxed setting °- Limit salt to <2000 mg/day °- Follow DASH eating plan (heart healthy diet) °- limit alcohol to 2 standard drinks per day for men and 1 per day for women °- avoid tobacco products °- get at least 2 hours of regular aerobic exercise weekly °Patient aware of signs/symptoms requiring further/urgent evaluation. °Labs updated today. ° °

## 2021-12-04 NOTE — Assessment & Plan Note (Signed)
Continue supplementation. No complaints.

## 2021-12-04 NOTE — Assessment & Plan Note (Signed)
Unsure of status - no recent A1c  Continue current medications UTD on vaccines, eye exam, foot exam On ACEi On Statin Discussed diet and exercise F/u in 3 months

## 2021-12-04 NOTE — Progress Notes (Signed)
______________________________________________________________________  HPI Matthew Fisher is a 51 y.o. male presenting to [place] [facility] today to establish care. Previously followed with the community clinic. Here with his wife. He is legally blind.   Patient Care Team: Terrilyn Saver, NP as PCP - General (Family Medicine) Leonie Man, MD as PCP - Cardiology (Cardiology)  Health Maintenance  Topic Date Due   Complete foot exam   Never done   Eye exam for diabetics  Never done   Hepatitis C Screening: USPSTF Recommendation to screen - Ages 20-79 yo.  Never done   Hemoglobin A1C  03/10/2020   COVID-19 Vaccine (3 - Booster for Pfizer series) 12/20/2021*   Flu Shot  01/05/2022*   Zoster (Shingles) Vaccine (1 of 2) 03/03/2022*   Tetanus Vaccine  12/04/2022*   Colon Cancer Screening  11/24/2031   HIV Screening  Completed   HPV Vaccine  Aged Out  *Topic was postponed. The date shown is not the original due date.     Concerns today: Normal colonoscopy earlier this year. Polyp removed last September.  HYPERTENSION: - Medications: amlodipine 10 mg daily; coreg 25 mg BID; lasix 40 mg daily; lisinopril 20 mg daily;  - Compliance: good - Checking BP at home: yes, 140-150s - Denies any SOB, recurrent headaches, CP, vision changes, LE edema, dizziness, palpitations, or medication side effects. - Diet: low sodium, low carb diet  - Exercise: walking, yard work   GOUT; -mostly in knees; takes allopurinol daily, no flaring currently   HYPERLIPIDEMIA - medications: lipitor 80 mg  - compliance: good  - medication SEs: none The ASCVD Risk score (Arnett DK, et al., 2019) failed to calculate for the following reasons:   The patient has a prior MI or stroke diagnosis - on ASA 81 mg  DIABETES - Checking BG at home: occasionally 80-190s - Medications: Januvia  - Compliance: yes - Eye exam: reports he follows regularly, last seen a few weeks agp - Foot  exam: today  - Denies symptoms of hypoglycemia, polyuria, polydipsia, numbness extremities, foot ulcers/trauma, wounds that are not healing, medication side effects   NSTEMI 2019 - was following with cardiology at community clinic   Vitamin D deficiency - on weekly oral replacement  Iron deficiency - on daily 325 mg, no side effects    Patient Active Problem List   Diagnosis Date Noted   Gastroesophageal reflux disease with esophagitis 12/04/2021   Chronic gout of knee 12/04/2021   Mixed hyperlipidemia 12/04/2021   Iron deficiency 12/04/2021   Acute gout due to renal impairment involving left knee 09/28/2021   Synovitis of right knee 09/28/2021   CHF (congestive heart failure) ([place]) 09/11/2019   Nonadherence to medical treatment    Tobacco abuse disorder    CAD (coronary artery disease) 10/18/2017   CKD (chronic kidney disease), stage II 10/18/2017   NSTEMI (non-ST elevated myocardial infarction) ([place]) 10/15/2017   Type 2 diabetes mellitus with hyperglycemia, without long-term current use of insulin ([place]) 10/15/2017   Hypertension 10/15/2017   Gastroparesis due to secondary diabetes ([place]) 10/15/2017   Hypertensive emergency, no CHF     PHQ9 Today: Depression screen [facility] 2/9 12/04/2021  Decreased Interest 0  Down, Depressed, Hopeless 0  PHQ - 2 Score 0   GAD7 Today: No flowsheet data found. ______________________________________________________________________ PMH Past Medical History:  Diagnosis Date   Arthritis    CAD (coronary artery disease)    a. NSTEMI 10/2017 s/p multivessel PCI of the OM1, PDA,  PLA ostium and mid PLA.   CKD (chronic kidney disease), stage II    GERD (gastroesophageal reflux disease)    Gout    Hypertension    Insulin dependent diabetes mellitus     ROS All review of systems negative except what is listed in the HPI  PHYSICAL EXAM Physical Exam Vitals reviewed.  Constitutional:      Appearance: Normal appearance.  HENT:     Head:  Normocephalic.  Cardiovascular:     Rate and Rhythm: Normal rate and regular rhythm.  Pulmonary:     Effort: Pulmonary effort is normal.     Breath sounds: Normal breath sounds.  Skin:    General: Skin is warm and dry.     Comments: Diabetic foot exam was performed.  No deformities or other abnormal visual findings.  Posterior tibialis and dorsalis pulse intact bilaterally.  Intact to touch and monofilament testing bilaterally.    Neurological:     General: No focal deficit present.     Mental Status: He is alert and oriented to person, place, and time. Mental status is at baseline.  Psychiatric:        Mood and Affect: Mood normal.        Behavior: Behavior normal.        Thought Content: Thought content normal.        Judgment: Judgment normal.   ______________________________________________________________________ ASSESSMENT AND PLAN  Hypertension Blood pressure is at goal for age and co-morbidities.  I recommend continuing current regimen.  In addition they were instructed on the following: - BP goal <130/80 - monitor and log blood pressures at home - check around the same time each day in a relaxed setting - Limit salt to <2000 mg/day - Follow DASH eating plan (heart healthy diet) - limit alcohol to 2 standard drinks per day for men and 1 per day for women - avoid tobacco products - get at least 2 hours of regular aerobic exercise weekly Patient aware of signs/symptoms requiring further/urgent evaluation. Labs updated today.   CHF (congestive heart failure) (HCC) No signs of fluid overload today. Continue lasix. Cardiology referral placed.   CKD (chronic kidney disease), stage II Continue avoiding nephrotoxic drugs. Labs today. Scheduled to see nephro next month for routine f/u.   Gastroesophageal reflux disease with esophagitis Refill placed. Continue lifestyle measures - avoid trigger foods, large meals, lying down immediately after eating.   Type 2 diabetes  mellitus with hyperglycemia, without long-term current use of insulin (HCC) Unsure of status - no recent A1c  Continue current medications UTD on vaccines, eye exam, foot exam On ACEi On Statin Discussed diet and exercise F/u in 3 months   Chronic gout of knee Allopurinol refilled. No complaints today.   Mixed hyperlipidemia HLD PLAN: -Medication management: continue Lipitor -Repeat CMP and lipid panel today -Diet low in saturated fat -Regular exercise - at least 30 minutes, 5 times per week   Iron deficiency Continue supplementation. No complaints.     Establish care Education provided today during visit and on AVS for patient to review at home.  Diet and Exercise recommendations provided.  Current diagnoses and recommendations discussed. HM recommendations reviewed with recommendations.   Meds ordered this encounter  Medications   amLODipine (NORVASC) 10 MG tablet    Sig: Take 1 tablet (10 mg total) by mouth daily.    Dispense:  30 tablet    Refill:  5    Order Specific Question:   Supervising Provider  Answer:   Penni Homans A [[id number]]   atorvastatin (LIPITOR) 80 MG tablet    Sig: Take 1 tablet (80 mg total) by mouth daily at 6 PM.    Dispense:  30 tablet    Refill:  5    Order Specific Question:   Supervising Provider    Answer:   Penni Homans A [[id number]]   carvedilol (COREG) 25 MG tablet    Sig: Take 1 tablet (25 mg total) by mouth 2 (two) times daily with a meal.    Dispense:  60 tablet    Refill:  5    Order Specific Question:   Supervising Provider    Answer:   Penni Homans A [[id number]]   ferrous sulfate 325 (65 FE) MG tablet    Sig: Take 1 tablet (325 mg total) by mouth every morning.    Dispense:  90 tablet    Refill:  3    Order Specific Question:   Supervising Provider    Answer:   Penni Homans A [[id number]]   sitaGLIPtin (JANUVIA) 50 MG tablet    Sig: Take 1 tablet (50 mg total) by mouth daily.    Dispense:  90 tablet    Refill:  3    Order Specific  Question:   Supervising Provider    Answer:   Penni Homans A [[id number]]   allopurinol (ZYLOPRIM) 100 MG tablet    Sig: Take 2 tablets (200 mg total) by mouth daily.    Dispense:  90 tablet    Refill:  3    Order Specific Question:   Supervising Provider    Answer:   Penni Homans A [[id number]]   lisinopril (ZESTRIL) 20 MG tablet    Sig: Take 1 tablet (20 mg total) by mouth daily.    Dispense:  90 tablet    Refill:  3    Order Specific Question:   Supervising Provider    Answer:   Penni Homans A [[id number]]   furosemide (LASIX) 40 MG tablet    Sig: Take 1 tablet (40 mg total) by mouth daily.    Dispense:  90 tablet    Refill:  3    Order Specific Question:   Supervising Provider    Answer:   Penni Homans A [[id number]]   PROTONIX 40 MG tablet    Sig: Take 1 tablet (40 mg total) by mouth 2 (two) times daily.    Dispense:  90 tablet    Refill:  3    Order Specific Question:   Supervising Provider    Answer:   Penni Homans A [[id number]]      Return in about 3 months (around 03/03/2022) for routine f/u .    Purcell Nails Olevia Bowens, DNP, FNP-C

## 2021-12-04 NOTE — Assessment & Plan Note (Signed)
Continue avoiding nephrotoxic drugs. Labs today. Scheduled to see nephro next month for routine f/u.

## 2021-12-04 NOTE — Patient Instructions (Signed)
Thank you for choosing [facility] at [facility] for your Primary Care needs. I am excited for the opportunity to partner with you to meet your health care goals. It was a pleasure meeting you today!  Recommendations from today's visit: Continue current medications for now. We will update labs and let you know any changes to the plan.  Information on diet, exercise, and health maintenance recommendations are listed below. This is information to help you be sure you are on track for optimal health and monitoring.   Please look over this and let us know if you have any questions or if you have completed any of the health maintenance outside of [facility] so that we can be sure your records are up to date.  ___________________________________________________________  MyChart:  For all urgent or time sensitive needs we ask that you please call the office to avoid delays. Our number is [phone]. MyChart is not constantly monitored and due to the large volume of messages a day, replies may take up to 72 business hours.  MyChart Policy: MyChart allows for you to see your visit notes, after visit summary, provider recommendations, lab and tests results, make an appointment, request refills, and contact your provider or the office for non-urgent questions or concerns. Providers are seeing patients during normal business hours and do not have built in time to review MyChart messages.  We ask that you allow a minimum of 3 business days for responses to [employer]. For this reason, please do not send urgent requests through MyChart. Please call the office at [phone]. New and ongoing conditions may require a visit. We have virtual and in-person visits available for your convenience.  Complex MyChart concerns may require a visit. Your provider may request you schedule a virtual or in-person visit to ensure we are providing the best care possible. MyChart messages sent after  11:00 AM on Friday will not be received by the provider until Monday morning.    Lab and Test Results: You will receive your lab and test results on MyChart as soon as they are completed and results have been sent by the lab or testing facility. Due to this service, you will receive your results BEFORE your provider.  I review lab and test results each morning prior to seeing patients. Some results require collaboration with other providers to ensure you are receiving the most appropriate care. For this reason, we ask that you please allow a minimum of 3-5 business days from the time that ALL results have been received for your provider to receive and review lab and test results and contact you about these.  Most lab and test result comments from the provider will be sent through MyChart. Your provider may recommend changes to the plan of care, follow-up visits, repeat testing, ask questions, or request an office visit to discuss these results. You may reply directly to this message or call the office to provide information for the provider or set up an appointment. In some instances, you will be called with test results and recommendations. Please let us know if this is preferred and we will make note of this in your chart to provide this for you.    If you have not heard a response to your lab or test results in 5 business days from all results returning to MyChart, please call the office to let us know. We ask that you please avoid calling prior to this time unless there is an  emergent concern. Due to high call volumes, this can delay the resulting process.  After Hours: For all non-emergency after hours needs, please call the office at [phone] and select the option to reach the on-call  service. On-call services are shared between multiple [address] offices and therefore it will not be possible to speak directly with your provider. On-call providers may provide medical advice and  recommendations, but are unable to provide refills for maintenance medications.  For all emergency or urgent medical needs after normal business hours, we recommend that you seek care at the closest Urgent Care or Emergency Department to ensure appropriate treatment in a timely manner.  [facility] [place] has a 24 hour emergency room located on the ground floor for your convenience.   Urgent Concerns During the Business Day Providers are seeing patients from 8AM to 5PM with a busy schedule and are most often not able to respond to non-urgent calls until the end of the day or the next business day. If you should have URGENT concerns during the day, please call and speak to the nurse or schedule a same day appointment so that we can address your concern without delay.   Thank you, again, for choosing me as your health care partner. I appreciate your trust and look forward to learning more about you.   Lollie Marrow Reola Calkins, DNP, FNP-C  ___________________________________________________________  Health Maintenance Recommendations Screening Testing Mammogram Every 1-2 years based on history and risk factors Starting at age 92 Pap Smear Ages 21-39 every 3 years Ages 71-65 every 5 years with HPV testing More frequent testing may be required based on results and history Colon Cancer Screening Every 1-10 years based on test performed, risk factors, and history Starting at age 3 Bone Density Screening Every 2-10 years based on history Starting at age 35 for women Recommendations for men differ based on medication usage, history, and risk factors AAA Screening One time ultrasound Men 68-77 years old who have ever smoked Lung Cancer Screening Low Dose Lung CT every 12 months Age 46-80 years with a 20 pack-year smoking history who still smoke or who have quit within the last 15 years  Screening Labs Routine  Labs: Complete Blood Count (CBC), Complete Metabolic Panel (CMP),  Cholesterol (Lipid Panel) Every 6-12 months based on history and medications May be recommended more frequently based on current conditions or previous results Hemoglobin A1c Lab Every 3-12 months based on history and previous results Starting at age 16 or earlier with diagnosis of diabetes, high cholesterol, BMI >26, and/or risk factors Frequent monitoring for patients with diabetes to ensure blood sugar control Thyroid Panel (TSH w/ T3 & T4) Every 6 months based on history, symptoms, and risk factors May be repeated more often if on medication HIV One time testing for all patients 89 and older May be repeated more frequently for patients with increased risk factors or exposure Hepatitis C One time testing for all patients 44 and older May be repeated more frequently for patients with increased risk factors or exposure Gonorrhea, Chlamydia Every 12 months for all sexually active persons 13-24 years Additional monitoring may be recommended for those who are considered high risk or who have symptoms PSA Men 26-67 years old with risk factors Additional screening may be recommended from age 75-69 based on risk factors, symptoms, and history  Vaccine Recommendations Tetanus Booster All adults every 10 years Flu Vaccine All patients 6 months and older every year COVID Vaccine All patients 12 years  and older Initial dosing with booster May recommend additional booster based on age and health history HPV Vaccine 2 doses all patients age 80-26 Dosing may be considered for patients over 26 Shingles Vaccine (Shingrix) 2 doses all adults 50 years and older Pneumonia (Pneumovax 23) All adults 65 years and older May recommend earlier dosing based on health history Pneumonia (Prevnar 64) All adults 65 years and older Dosed 1 year after Pneumovax 23 Pneumonia (Prevnar 20) All adults 65 years and older (adults 19-64 with certain conditions or risk factors) 1 dose  For those who have no  received Prevnar 13 vaccine previously   Additional Screening, Testing, and Vaccinations may be recommended on an individualized basis based on family history, health history, risk factors, and/or exposure.  __________________________________________________________  Diet Recommendations for All Patients  I recommend that all patients maintain a diet low in saturated fats, carbohydrates, and cholesterol. While this can be challenging at first, it is not impossible and small changes can make big differences.  Things to try: Decreasing the amount of soda, sweet tea, and/or juice to one or less per day and replace with water While water is always the first choice, if you do not like water you may consider adding a water additive without sugar to improve the taste other sugar free drinks Replace potatoes with a brightly colored vegetable at dinner Use healthy oils, such as canola oil or olive oil, instead of butter or hard margarine Limit your bread intake to two pieces or less a day Replace regular pasta with low carb pasta options Bake, broil, or grill foods instead of frying Monitor portion sizes  Eat smaller, more frequent meals throughout the day instead of large meals  An important thing to remember is, if you love foods that are not great for your health, you don't have to give them up completely. Instead, allow these foods to be a reward when you have done well. Allowing yourself to still have special treats every once in a while is a nice way to tell yourself thank you for working hard to keep yourself healthy.   Also remember that every day is a new day. If you have a bad day and "fall off the wagon", you can still climb right back up and keep moving along on your journey!  We have resources available to help you!  Some websites that may be helpful include: www.http://www.wall-moore.info/  Www.VeryWellFit.com _____________________________________________________________  Activity  Recommendations for All Patients  I recommend that all adults get at least 20 minutes of moderate physical activity that elevates your heart rate at least 5 days out of the week.  Some examples include: Walking or jogging at a pace that allows you to carry on a conversation Cycling (stationary bike or outdoors) Water aerobics Yoga Weight lifting Dancing If physical limitations prevent you from putting stress on your joints, exercise in a pool or seated in a chair are excellent options.  Do determine your MAXIMUM heart rate for activity: YOUR AGE - 220 = MAX HeartRate   Remember! Do not push yourself too hard.  Start slowly and build up your pace, speed, weight, time in exercise, etc.  Allow your body to rest between exercise and get good sleep. You will need more water than normal when you are exerting yourself. Do not wait until you are thirsty to drink. Drink with a purpose of getting in at least 8, 8 ounce glasses of water a day plus more depending on how much you exercise  and sweat.    If you begin to develop dizziness, chest pain, abdominal pain, jaw pain, shortness of breath, headache, vision changes, lightheadedness, or other concerning symptoms, stop the activity and allow your body to rest. If your symptoms are severe, seek emergency evaluation immediately. If your symptoms are concerning, but not severe, please let us know so that we can recommend further evaluation.

## 2021-12-05 LAB — CBC
HCT: 30.9 % — ABNORMAL LOW (ref 39.0–52.0)
Hemoglobin: 10.8 g/dL — ABNORMAL LOW (ref 13.0–17.0)
MCHC: 35 g/dL (ref 30.0–36.0)
MCV: 89.1 fl (ref 78.0–100.0)
Platelets: 236 10*3/uL (ref 150.0–400.0)
RBC: 3.47 Mil/uL — ABNORMAL LOW (ref 4.22–5.81)
RDW: 13.4 % (ref 11.5–15.5)
WBC: 6.1 10*3/uL (ref 4.0–10.5)

## 2021-12-05 LAB — LIPID PANEL
Cholesterol: 155 mg/dL (ref 0–200)
HDL: 41.8 mg/dL (ref >39.00)
LDL Cholesterol: 81 mg/dL (ref 0–99)
NonHDL: 112.75
Total CHOL/HDL Ratio: 4
Triglycerides: 159 mg/dL — ABNORMAL HIGH (ref 0.0–149.0)
VLDL: 31.8 mg/dL (ref 0.0–40.0)

## 2021-12-05 LAB — COMPREHENSIVE METABOLIC PANEL
ALT: 15 U/L (ref 0–53)
AST: 17 U/L (ref 0–37)
Albumin: 4 g/dL (ref 3.5–5.2)
Alkaline Phosphatase: 101 U/L (ref 39–117)
BUN: 32 mg/dL — ABNORMAL HIGH (ref 6–23)
CO2: 28 mEq/L (ref 19–32)
Calcium: 8.7 mg/dL (ref 8.4–10.5)
Chloride: 94 mEq/L — ABNORMAL LOW (ref 96–112)
Creatinine, Ser: 2.25 mg/dL — ABNORMAL HIGH (ref 0.40–1.50)
GFR: 32.76 mL/min — ABNORMAL LOW (ref >60.00)
Glucose, Bld: 479 mg/dL — ABNORMAL HIGH (ref 70–99)
Potassium: 4.1 mEq/L (ref 3.5–5.1)
Sodium: 132 mEq/L — ABNORMAL LOW (ref 135–145)
Total Bilirubin: 0.5 mg/dL (ref 0.2–1.2)
Total Protein: 6.7 g/dL (ref 6.0–8.3)

## 2021-12-05 LAB — HEMOGLOBIN A1C: Hgb A1c MFr Bld: 9.7 % — ABNORMAL HIGH (ref 4.6–6.5)

## 2021-12-05 LAB — TSH: TSH: 1.09 u[IU]/mL (ref 0.35–5.50)

## 2021-12-07 MED ORDER — LEVEMIR FLEXTOUCH 100 UNIT/ML ~~LOC~~ SOPN: 10.0000 [IU] | PEN_INJECTOR | Freq: Every day | SUBCUTANEOUS | 11 refills | Status: AC

## 2021-12-07 MED ORDER — FREESTYLE LIBRE 2 SENSOR MISC: 1.0000 | 6 refills | Status: DC

## 2021-12-07 MED ORDER — PEN NEEDLES 32G X 4 MM MISC: 1.0000 | Freq: Every day | 4 refills | Status: AC

## 2021-12-07 MED ORDER — FREESTYLE LIBRE 2 READER DEVI: 1.0000 | 6 refills | Status: DC

## 2021-12-07 NOTE — Addendum Note (Signed)
Addended by: Hyman Hopes B on: 12/07/2021 08:29 AM   Modules accepted: Orders

## 2021-12-07 NOTE — Progress Notes (Signed)
Blood counts are close to your baseline.  Metabolic panel remains abnormal, but still close to your baseline - keep your upcoming appointment with your kidney doctor.  Triglycerides are just barely elevated - continue your cholesterol medicine and heart healthy diet with regular exercise. Thyroid is normal  A1c (diabetes) is up to 9.7% - I am going to add some insulin since this is quite high. For now, I want you to start taking 10 units of Levemir insulin every night and check your fasting (no food) blood sugars in the morning. Our morning blood sugar goal is <130. So, every 3 days, you can increase your insulin by 2 units if morning sugars are still consistently higher than 130. You can ask the pharmacist to teach you how to do the injections, or schedule a nurse visit with [place].   Please focus on low-carb, low-sugar diet, regular exercise, and medication compliance. See me again in 2-4 weeks and bring a log of daily fasting blood sugar readings - I'm going to also try to send you in a continuous glucose monitor to try, if you don't like or it is too expensive, let me know or continue with your regular blood sugar machine. Call the office to schedule the follow-up with me (2-4 weeks).

## 2021-12-08 ENCOUNTER — Other Ambulatory Visit: Payer: Self-pay | Admitting: *Deleted

## 2021-12-08 MED ORDER — PANTOPRAZOLE SODIUM 40 MG PO TBEC: 40.0000 mg | DELAYED_RELEASE_TABLET | Freq: Two times a day (BID) | ORAL | 3 refills | Status: DC

## 2021-12-08 NOTE — Progress Notes (Signed)
Pantoprazole sent to pharmacy in place of brand Protonix per pt request. ?

## 2021-12-25 ENCOUNTER — Other Ambulatory Visit: Payer: Self-pay

## 2021-12-25 ENCOUNTER — Encounter: Payer: Self-pay | Admitting: Cardiology

## 2021-12-25 ENCOUNTER — Ambulatory Visit: Payer: Medicare Other | Admitting: Cardiology

## 2021-12-25 VITALS — BP 144/82 | HR 84 | Ht 68.0 in | Wt 217.0 lb

## 2021-12-25 DIAGNOSIS — E1165 Type 2 diabetes mellitus with hyperglycemia: Secondary | ICD-10-CM | POA: Diagnosis not present

## 2021-12-25 DIAGNOSIS — I2510 Atherosclerotic heart disease of native coronary artery without angina pectoris: Secondary | ICD-10-CM | POA: Diagnosis not present

## 2021-12-25 DIAGNOSIS — Z720 Tobacco use: Secondary | ICD-10-CM | POA: Diagnosis not present

## 2021-12-25 DIAGNOSIS — I10 Essential (primary) hypertension: Secondary | ICD-10-CM | POA: Diagnosis not present

## 2021-12-25 DIAGNOSIS — R079 Chest pain, unspecified: Secondary | ICD-10-CM

## 2021-12-25 DIAGNOSIS — E782 Mixed hyperlipidemia: Secondary | ICD-10-CM

## 2021-12-25 MED ORDER — EZETIMIBE 10 MG PO TABS: 10.0000 mg | ORAL_TABLET | Freq: Every day | ORAL | 3 refills | Status: DC

## 2021-12-25 NOTE — Progress Notes (Signed)
? ?Cardiology Consultation:   ? ?Date:  12/25/2021  ? ?ID:  Matthew Fisher, DOB 02/14/1971, MRN [id number] ? ?PCP:  Terrilyn Saver, NP  ?Cardiologist:  Jenne Campus, MD  ? ?Referring MD: Terrilyn Saver, NP  ? ?No chief complaint on file. ?I need to be established as a patient ? ?History of Present Illness:   ? ?Exodus Whitchurch is a 51 y.o. male who is being seen today for the evaluation of shortness of breath fatigue at the request of Terrilyn Saver, NP.  With past medical history significant for coronary artery disease, in 2019 he presented with non-STEMI with peak troponin 8.17, cardiac catheterization has been performed which showed completely occluded distal LAD, he did have PTCA and stenting of obtuse marginal branch as well as PDA and PLA, he was put on dual antiplatelet therapy for 1 year then consideration was for prolongation of dual antiplatelets therapy and definitely.  He also have type 2 diabetes which is poorly controlled, chronic kidney disease with creatinine of 2.2, he was sent to [place] for evaluation of his symptoms.  He said when he walks a lot he will get short of breath quite easily much worse than it used to be before.  Denies have any typical chest pain tightness squeezing pressure burning chest.  He also gets short of breath quite significantly when he tried to bend forward for example to tie his shoe.  He does not smoke anymore but he vapes.  He does not have any family history of premature coronary artery disease. ? ?Past Medical History:  ?Diagnosis Date  ? Arthritis   ? CAD (coronary artery disease)   ? a. NSTEMI 10/2017 s/p multivessel PCI of the OM1, PDA, PLA ostium and mid PLA.  ? CKD (chronic kidney disease), stage II   ? GERD (gastroesophageal reflux disease)   ? Gout   ? Hypertension   ? Insulin dependent diabetes mellitus   ? ? ?Past Surgical History:  ?Procedure Laterality Date  ? CORONARY STENT INTERVENTION N/A 10/17/2017  ? Procedure: CORONARY STENT INTERVENTION;  Surgeon:  Jettie Booze, MD;  Location: [facility];  Service: Cardiovascular;  Laterality: N/A;  ? FRACTURE SURGERY    ? right ring finger  ? KNEE ARTHROSCOPY WITH LATERAL MENISECTOMY Left 01/22/2013  ? Procedure: LEFT KNEE ARTHROSCOPY DEBRIDEMENT CHONDROPLASTY, LATERAL RELEASE AND partial medial meniscectomy;  Surgeon: Johnn Hai, MD;  Location: [facility];  Service: Orthopedics;  Laterality: Left;  ? KNEE SURGERY    ? "removed bone"  ? LEFT HEART CATH N/A 10/17/2017  ? Procedure: Left Heart Cath;  Surgeon: Jettie Booze, MD;  Location: [facility];  Service: Cardiovascular;  Laterality: N/A;  ? LEFT HEART CATH AND CORONARY ANGIOGRAPHY N/A 10/15/2017  ? Procedure: LEFT HEART CATH AND CORONARY ANGIOGRAPHY;  Surgeon: Leonie Man, MD;  Location: [facility];  Service: Cardiovascular;  Laterality: N/A;  ? SHOULDER SURGERY    ? ? ?Current Medications: ?Current Meds  ?Medication Sig  ? acetaminophen (TYLENOL) 325 MG tablet Take 2 tablets (650 mg total) by mouth every 4 (four) hours as needed for headache or mild pain.  ? allopurinol (ZYLOPRIM) 100 MG tablet Take 2 tablets (200 mg total) by mouth daily.  ? amLODipine (NORVASC) 10 MG tablet Take 1 tablet (10 mg total) by mouth daily.  ? aspirin 81 MG chewable tablet Chew 1 tablet (81 mg total) by mouth daily.  ? atorvastatin (LIPITOR) 80 MG tablet Take  1 tablet (80 mg total) by mouth daily at 6 PM.  ? carvedilol (COREG) 25 MG tablet Take 1 tablet (25 mg total) by mouth 2 (two) times daily with a meal.  ? Continuous Blood Gluc Receiver (FREESTYLE LIBRE 2 READER) DEVI 1 each by Does not apply route every 14 (fourteen) days.  ? Continuous Blood Gluc Sensor (FREESTYLE LIBRE 2 SENSOR) MISC 1 each by Does not apply route every 14 (fourteen) days.  ? diclofenac Sodium (VOLTAREN) 1 % GEL Apply 2 g topically 4 (four) times daily as needed (leg pain).  ? ezetimibe (ZETIA) 10 MG tablet Take 1 tablet (10 mg total) by mouth daily.  ? ferrous sulfate 325 (65  FE) MG tablet Take 1 tablet (325 mg total) by mouth every morning.  ? furosemide (LASIX) 40 MG tablet Take 1 tablet (40 mg total) by mouth daily.  ? insulin detemir (LEVEMIR FLEXTOUCH) 100 UNIT/ML FlexPen Inject 10 Units into the skin at bedtime. Start with 10 units every night. Titrate up by 2 units every 3 days if morning/fasting glucose is higher than 130 consistently.  ? Insulin Pen Needle (PEN NEEDLES) 32G X 4 MM MISC 1 each by Does not apply route daily.  ? lisinopril (ZESTRIL) 20 MG tablet Take 1 tablet (20 mg total) by mouth daily.  ? pantoprazole (PROTONIX) 40 MG tablet Take 1 tablet (40 mg total) by mouth 2 (two) times daily.  ? polyethylene glycol (MIRALAX) 17 g packet Take 17 g by mouth daily. (Patient taking differently: Take 17 g by mouth daily as needed for mild constipation.)  ? sitaGLIPtin (JANUVIA) 50 MG tablet Take 1 tablet (50 mg total) by mouth daily.  ? Vitamin D, Ergocalciferol, (DRISDOL) 1.25 MG (50000 UT) CAPS capsule Take by mouth every 7 (seven) days.   ?  ? ?Allergies:   Patient has no known allergies.  ? ?Social History  ? ?Socioeconomic History  ? Marital status: Married  ?  Spouse name: Not on file  ? Number of children: Not on file  ? Years of education: Not on file  ? Highest education level: Not on file  ?Occupational History  ? Not on file  ?Tobacco Use  ? Smoking status: Former  ?  Packs/day: 0.50  ?  Years: 20.00  ?  Pack years: 10.00  ?  Types: Cigarettes, Cigars  ?  Quit date: 10/08/2002  ?  Years since quitting: 19.2  ? Smokeless tobacco: Never  ?Vaping Use  ? Vaping Use: Never used  ?Substance and Sexual Activity  ? Alcohol use: Yes  ?  Comment: occasional  ? Drug use: No  ? Sexual activity: Yes  ?Other Topics Concern  ? Not on file  ?Social History Narrative  ? Not on file  ? ?Social Determinants of Health  ? ?Financial Resource Strain: Not on file  ?Food Insecurity: Not on file  ?Transportation Needs: Not on file  ?Physical Activity: Not on file  ?Stress: Not on file  ?Social  Connections: Not on file  ?  ? ?Family History: ?The patient's family history includes Cancer in his father; Diabetes in his mother. ?ROS:   ?Please see the history of present illness.    ?All 14 point review of systems negative except as described per history of present illness. ? ?EKGs/Labs/Other Studies Reviewed:   ? ?The following studies were reviewed today: ?Cardiac Cath 10/17/2017 ?Conclusion  ?  ?  ?Dist LAD lesion is 100% stenosed. ?Mid LAD lesion is 70% stenosed. ?Ost 1st Mrg  lesion is 80% stenosed. ?A drug-eluting stent was successfully placed using a STENT SYNERGY DES 2.75X12. ?Post intervention, there is a 0% residual stenosis. ?Ost RPDA lesion is 75% stenosed. ?A drug-eluting stent was successfully placed using a STENT SYNERGY DES 3X16. ?Post intervention, there is a 0% residual stenosis. ?Post Atrio-2 lesion is 70% stenosed. ?Post Atrio-3 lesion is 95% stenosed. ?A drug-eluting stent was successfully placed using a STENT SYNERGY DES 2.5X12. A second stent, 2.25 x 12 Synergy was placed overlapping. ?Post intervention, there is a 0% residual stenosis. ?Kissing balloon angioplasty was done at the ostium of the PDA and the PLA. ?The ostium of the posterolateral artery was treated Using a BALLOON SAPPHIRE 2.5X12. ?Post intervention, there is a 40% residual stenosis at the ostium of the PLA. ?LV end diastolic pressure is normal. ?There is no aortic valve stenosis. ?A drug-eluting stent was successfully placed. ?  ?Successful PCI of the OM1, PDA, PLA ostium and mid PLA.   ?  ?Continue dual antiplatelet therapy for at least one year along with aggressive secondary prevention.    ? ? ?EKG:  EKG is  ordered today.  The ekg ordered today demonstrates normal sinus rhythm, normal P interval normal QS complex duration fulgent, nonspecific ST segment changes ? ?Recent Labs: ?12/04/2021: ALT 15; BUN 32; Creatinine, Ser 2.25; Hemoglobin 10.8; Platelets 236.0; Potassium 4.1; Sodium 132; TSH 1.09  ?Recent Lipid Panel ?    ?Component Value Date/Time  ? CHOL 155 12/04/2021 1539  ? TRIG 159.0 (H) 12/04/2021 1539  ? HDL 41.80 12/04/2021 1539  ? CHOLHDL 4 12/04/2021 1539  ? VLDL 31.8 12/04/2021 1539  ? [place] 81 12/04/2021 1539

## 2021-12-25 NOTE — Patient Instructions (Signed)
Medication Instructions:  ?Your physician has recommended you make the following change in your medication:  ? ?Zetia 10 mg daily ? ?*If you need a refill on your cardiac medications before your next appointment, please call your pharmacy* ? ? ?Lab Work: ?Labs in 6 weeks: lipids  ? ?If you have labs (blood work) drawn today and your tests are completely normal, you will receive your results only by: ?MyChart Message (if you have MyChart) OR ?A paper copy in the mail ?If you have any lab test that is abnormal or we need to change your treatment, we will call you to review the results. ? ? ?Testing/Procedures: ?Your physician has requested that you have an echocardiogram. Echocardiography is a painless test that uses sound waves to create images of your heart. It provides your doctor with information about the size and shape of your heart and how well your heart?s chambers and valves are working. This procedure takes approximately one hour. There are no restrictions for this procedure. ? ? ? ? ?[facility] ?[address] ?[place], [place] [address] ?Phone: [phone] ? ? ? ?Please arrive 15 minutes prior to your appointment time for registration and insurance purposes. ? ?The test will take approximately 3 to 4 hours to complete; you may bring reading material.  If someone comes with you to your appointment, they will need to remain in the main lobby due to limited space in the testing area. **If you are pregnant or breastfeeding, please notify the nuclear lab prior to your appointment** ? ?How to prepare for your Myocardial Perfusion Test: ?Do not eat or drink 3 hours prior to your test, except you may have water. ?Do not consume products containing caffeine (regular or decaffeinated) 12 hours prior to your test. (ex: coffee, chocolate, sodas, tea). ?Do bring a list of your current medications with you.  If not listed below, you may take your medications as  normal. ?HOLD diabetic medication/insulin the morning of the test  Take half of long acting insulin the night before test ?Do wear comfortable clothes (no dresses or overalls) and walking shoes, tennis shoes preferred (No heels or open toe shoes are allowed). ?Do NOT wear cologne, perfume, aftershave, or lotions (deodorant is allowed). ?If these instructions are not followed, your test will have to be rescheduled. ? ?Please report to [address] for your test.  If you have questions or concerns about your appointment, you can call the Nuclear Lab at [phone]. ? ?If you cannot keep your appointment, please provide 24 hours notification to the Nuclear Lab, to avoid a possible $50 charge to your account.  ? ? ? ?Follow-Up: ?At [facility], you and your health needs are our priority.  As part of our continuing mission to provide you with exceptional heart care, we have created designated Provider Care Teams.  These Care Teams include your primary Cardiologist (physician) and Advanced Practice Providers (APPs -  Physician Assistants and Nurse Practitioners) who all work together to provide you with the care you need, when you need it. ? ?We recommend signing up for the patient portal called "MyChart".  Sign up information is provided on this After Visit Summary.  MyChart is used to connect with patients for Virtual Visits (Telemedicine).  Patients are able to view lab/test results, encounter notes, upcoming appointments, etc.  Non-urgent messages can be sent to your provider as well.   ?To learn more about what you can do  with MyChart, go to [email or web address].   ? ?Your next appointment:   ?2 month(s) ? ?The format for your next appointment:   ?In Person ? ?Provider:   ?Gypsy Balsam, MD  ? ? ?Other Instructions ?None ? ?

## 2021-12-26 ENCOUNTER — Other Ambulatory Visit: Payer: Self-pay

## 2021-12-26 DIAGNOSIS — R079 Chest pain, unspecified: Secondary | ICD-10-CM

## 2021-12-26 NOTE — Progress Notes (Signed)
f °

## 2021-12-28 ENCOUNTER — Other Ambulatory Visit: Payer: Self-pay

## 2021-12-28 ENCOUNTER — Ambulatory Visit: Payer: Medicare Other

## 2022-01-03 ENCOUNTER — Ambulatory Visit (HOSPITAL_BASED_OUTPATIENT_CLINIC_OR_DEPARTMENT_OTHER)
Admission: RE | Admit: 2022-01-03 | Discharge: 2022-01-03 | Disposition: A | Discharge status: Home / Self Care | Payer: Medicare Other | Source: Ambulatory Visit | Attending: Family Medicine | Admitting: Family Medicine

## 2022-01-03 ENCOUNTER — Ambulatory Visit (INDEPENDENT_AMBULATORY_CARE_PROVIDER_SITE_OTHER): Payer: Medicare Other | Admitting: Family Medicine

## 2022-01-03 ENCOUNTER — Encounter: Payer: Self-pay | Admitting: Family Medicine

## 2022-01-03 VITALS — Ht 68.0 in | Wt 217.0 lb

## 2022-01-03 DIAGNOSIS — M10371 Gout due to renal impairment, right ankle and foot: Secondary | ICD-10-CM

## 2022-01-03 DIAGNOSIS — M1A3610 Chronic gout due to renal impairment, right knee, without tophus (tophi): Secondary | ICD-10-CM

## 2022-01-03 DIAGNOSIS — M10362 Gout due to renal impairment, left knee: Secondary | ICD-10-CM

## 2022-01-03 IMAGING — DX DG KNEE COMPLETE 4+V*L*
4 series · 4 of 4 positions shown · non-contrast
Comparison: X-ray knee [DATE].

CLINICAL DATA: Chronic left knee pain.

EXAM:
LEFT KNEE - COMPLETE 4+ VIEW

[knee ap]
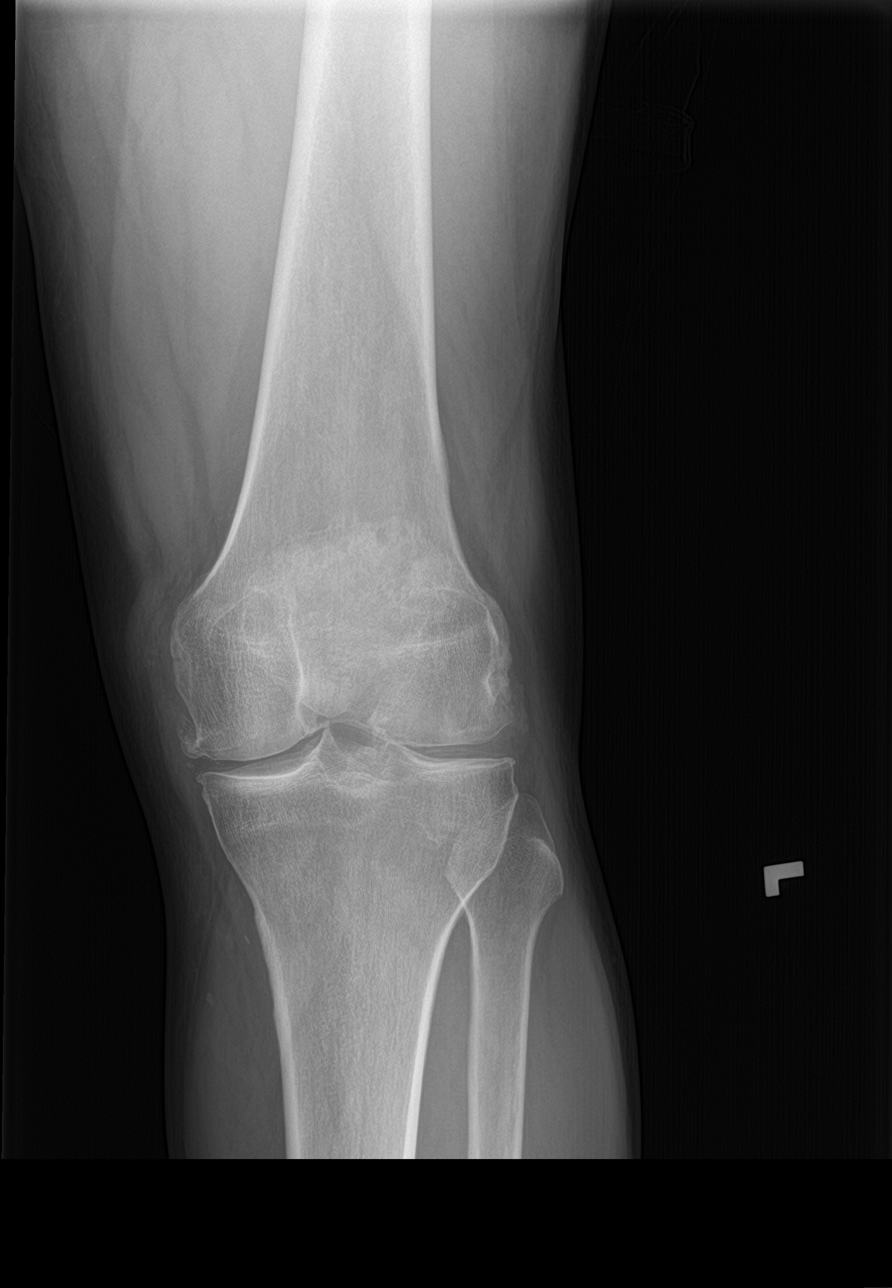

[tunnel]
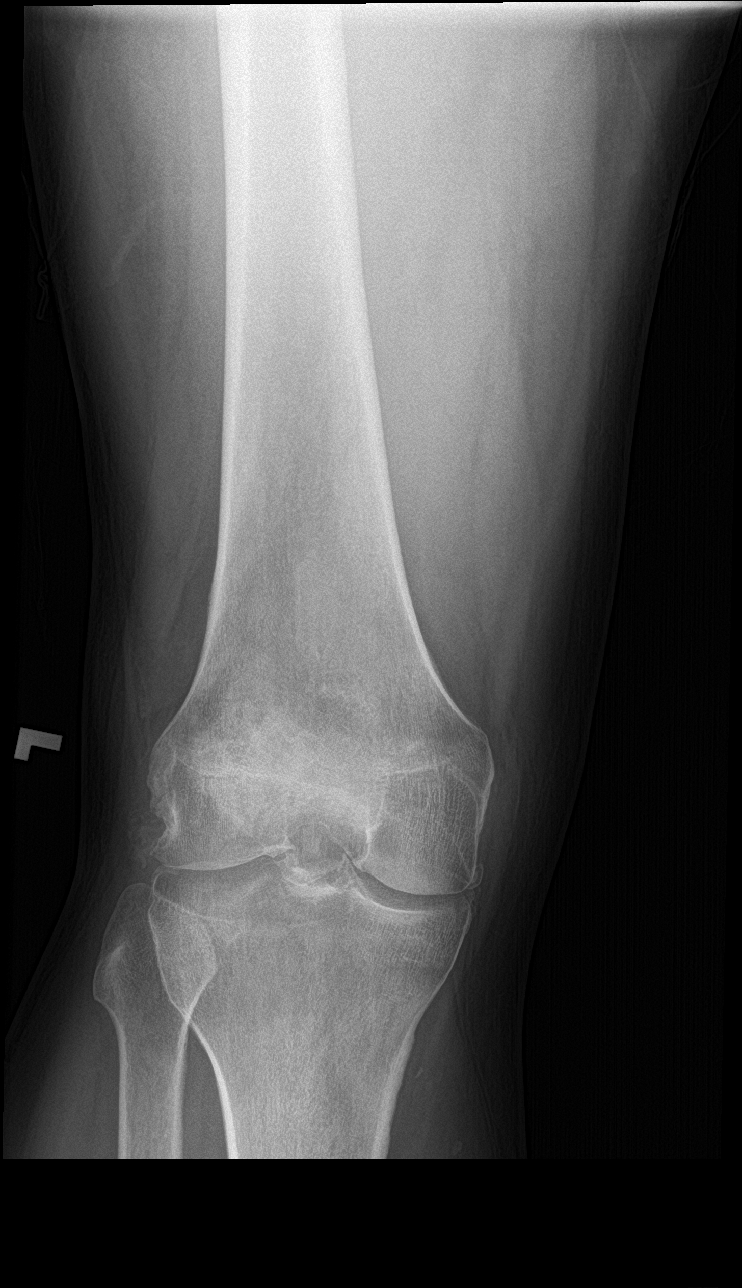

[knee lat]
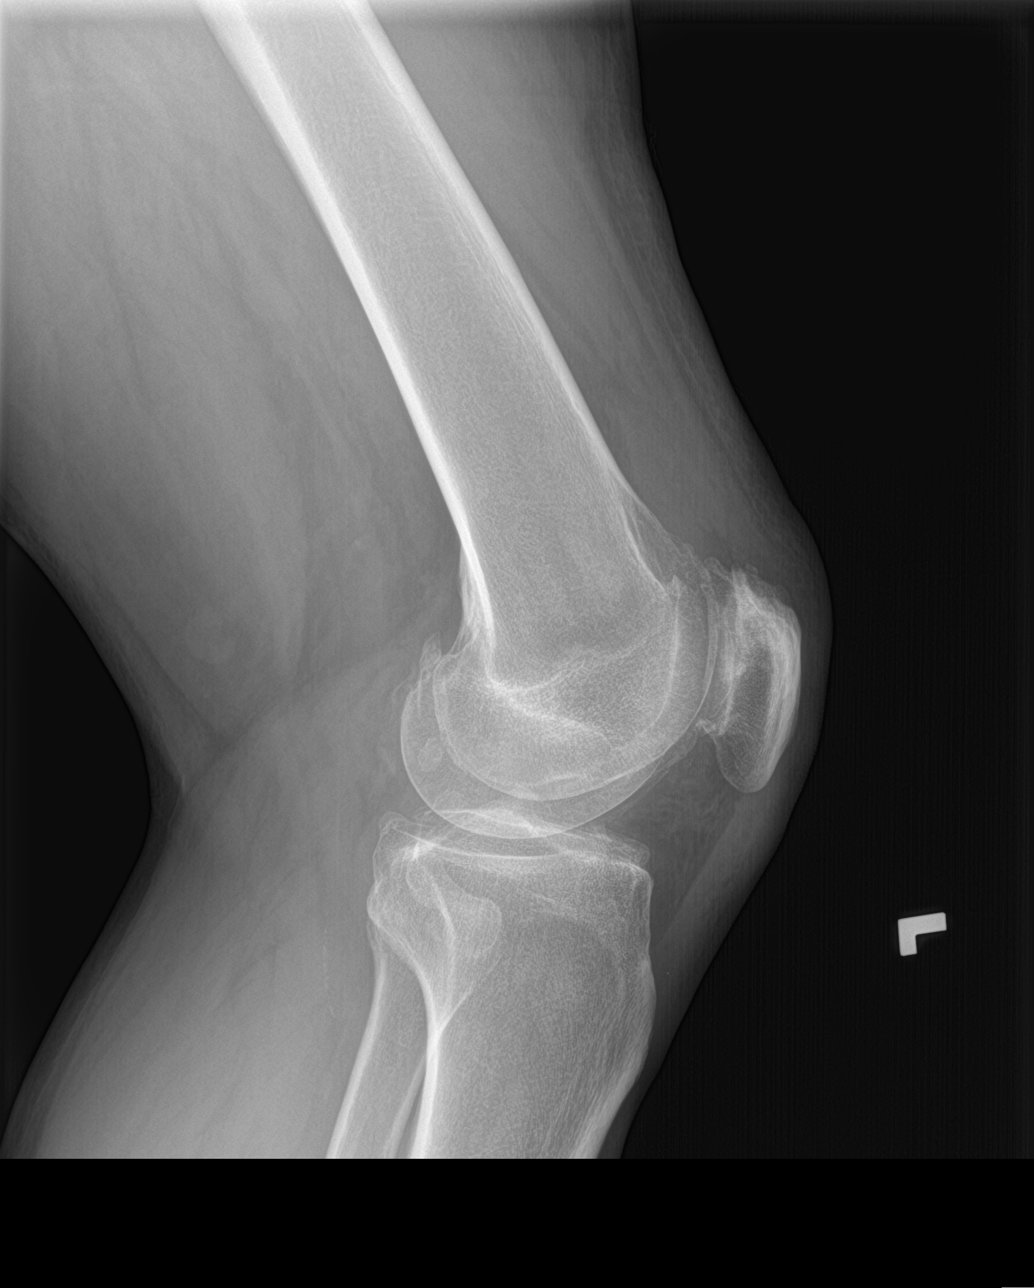

[knee sunrise]
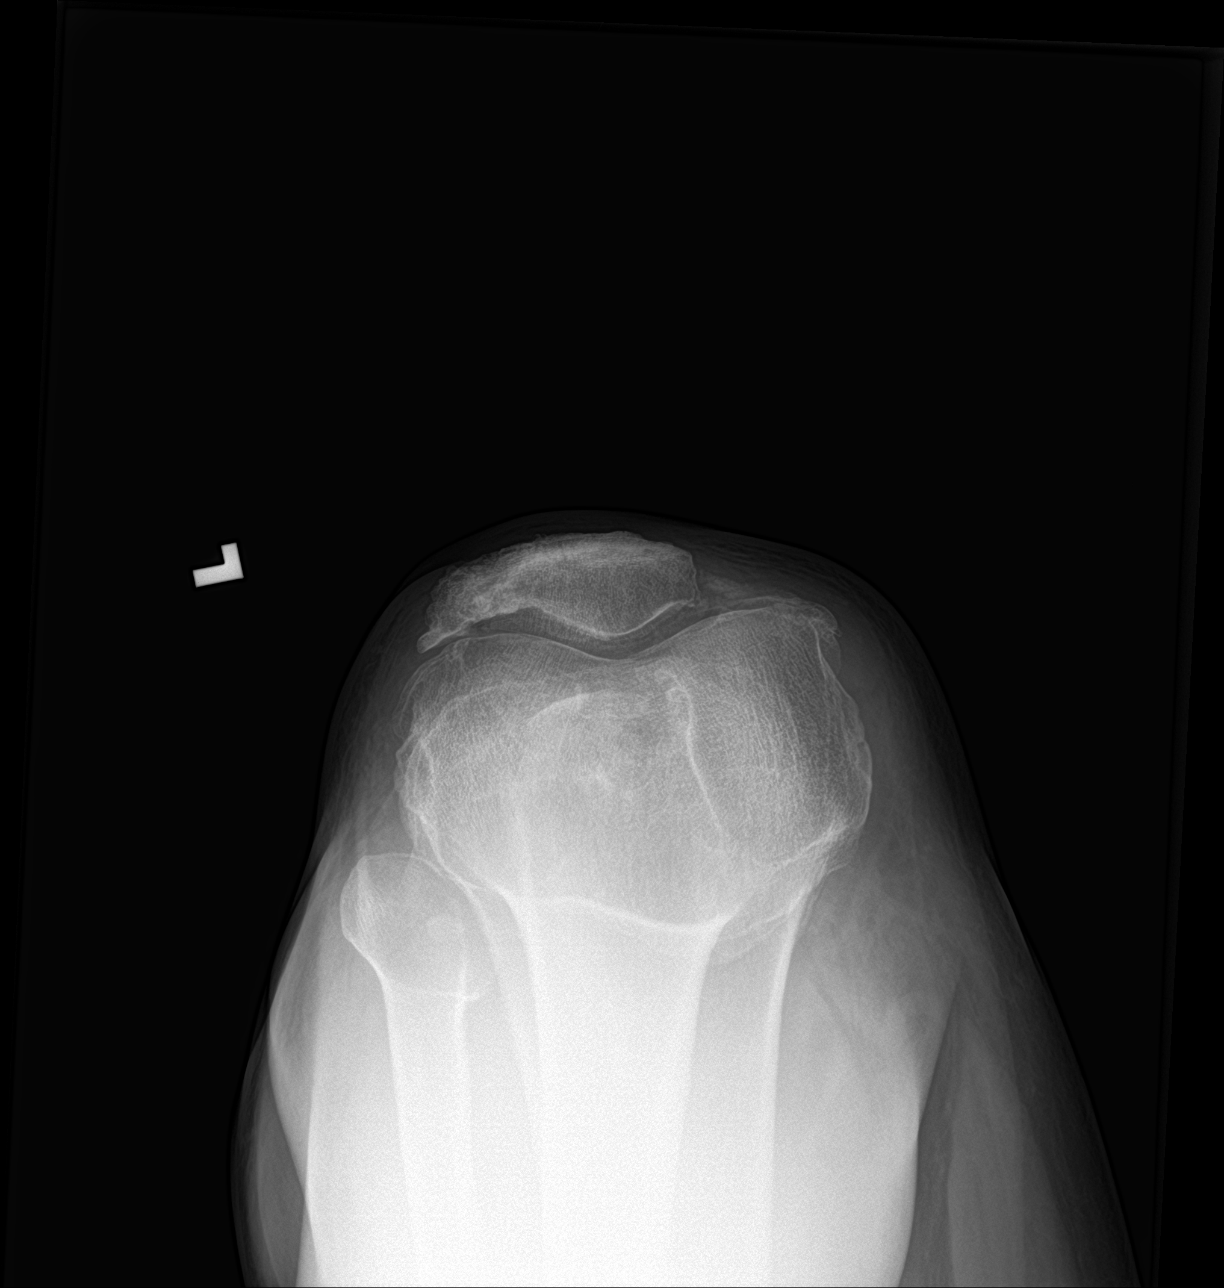

[4 of 4 positions shown; findings below may reference images not displayed]

FINDINGS: Chondrocalcinosis present in both the medial and lateral
compartment. Degenerative changes of osteoarthritis are present with
narrowing of the joint space, osteophyte formation and subchondral
sclerosis. Degenerative changes are relatively symmetric through all
3 compartments but perhaps greatest in the patellofemoral
compartment. Small suprapatellar knee joint effusion almost
certainly degenerative in nature. Chondrocalcinosis also present
within the patellofemoral space. No evidence of fracture,
malalignment or bony lesion.
IMPRESSION: 1. Tricompartmental degenerative osteoarthritis with evidence of
multi compartment chondrocalcinosis.
2. Patellofemoral compartment predominance in chondrocalcinosis
suggest underlying calcium pyrophosphate deposition disease (CPPD).
3. Small suprapatellar knee joint effusion is likely reactive in
nature.

## 2022-01-03 IMAGING — DX DG ANKLE COMPLETE 3+V*R*
3 series · 3 of 3 positions shown · non-contrast
Comparison: X-ray ankle [DATE].

CLINICAL DATA: Right ankle pain.

EXAM:
RIGHT ANKLE - COMPLETE 3+ VIEW

[ankle ap]
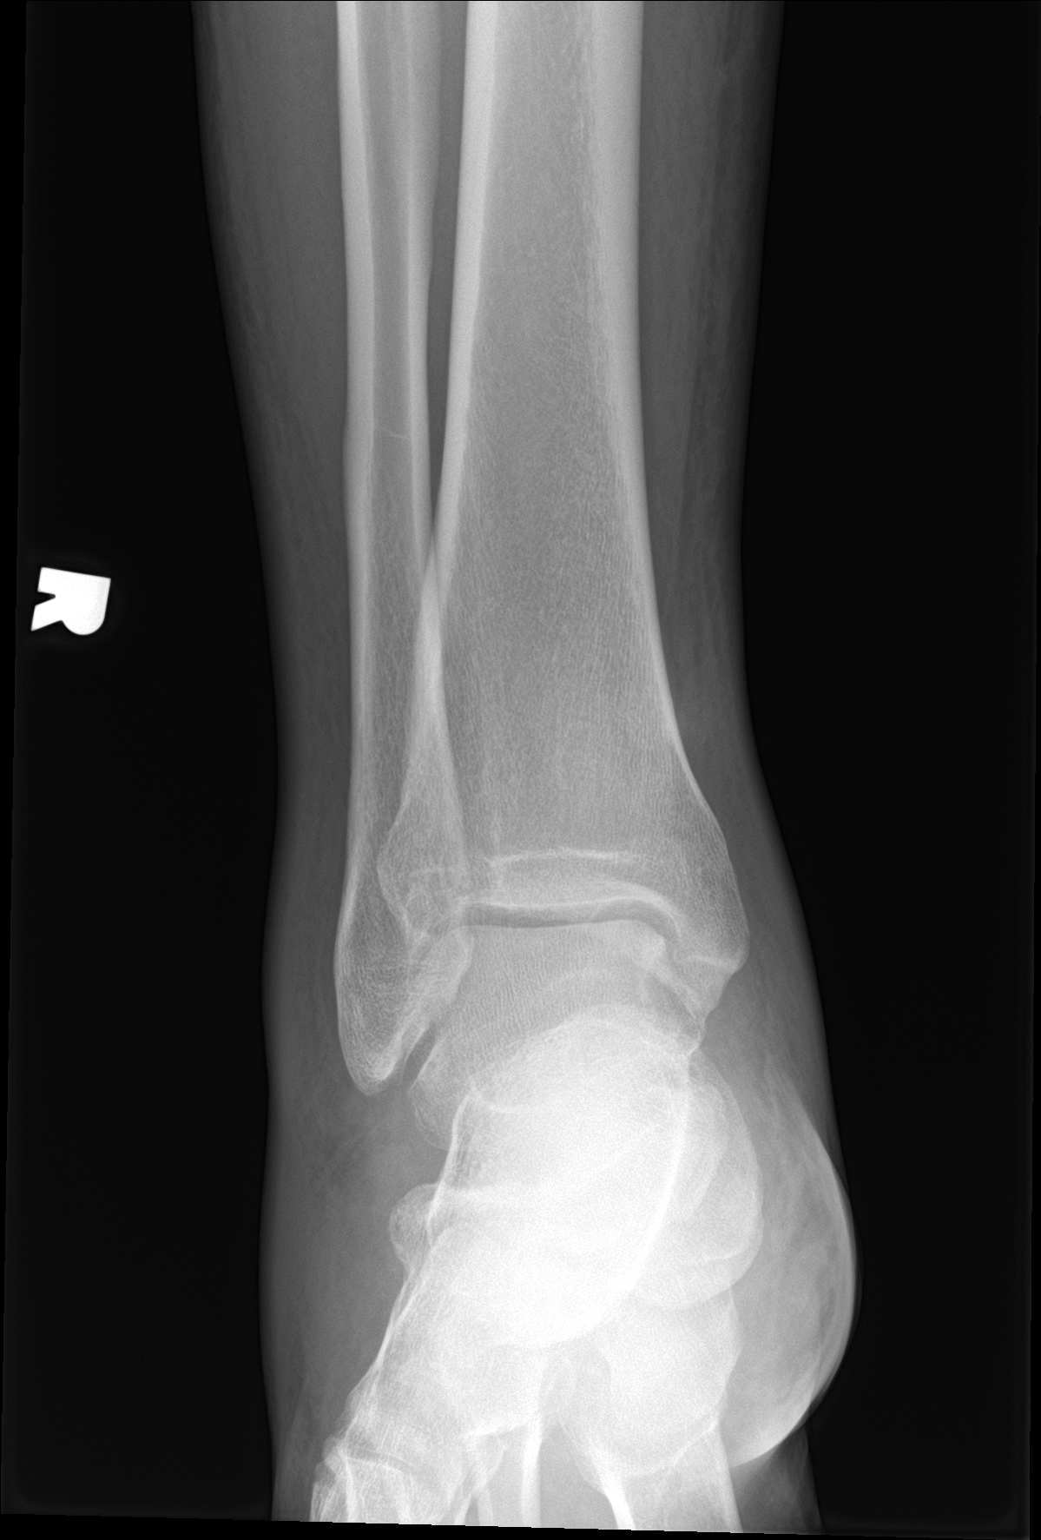

[ankle obl]
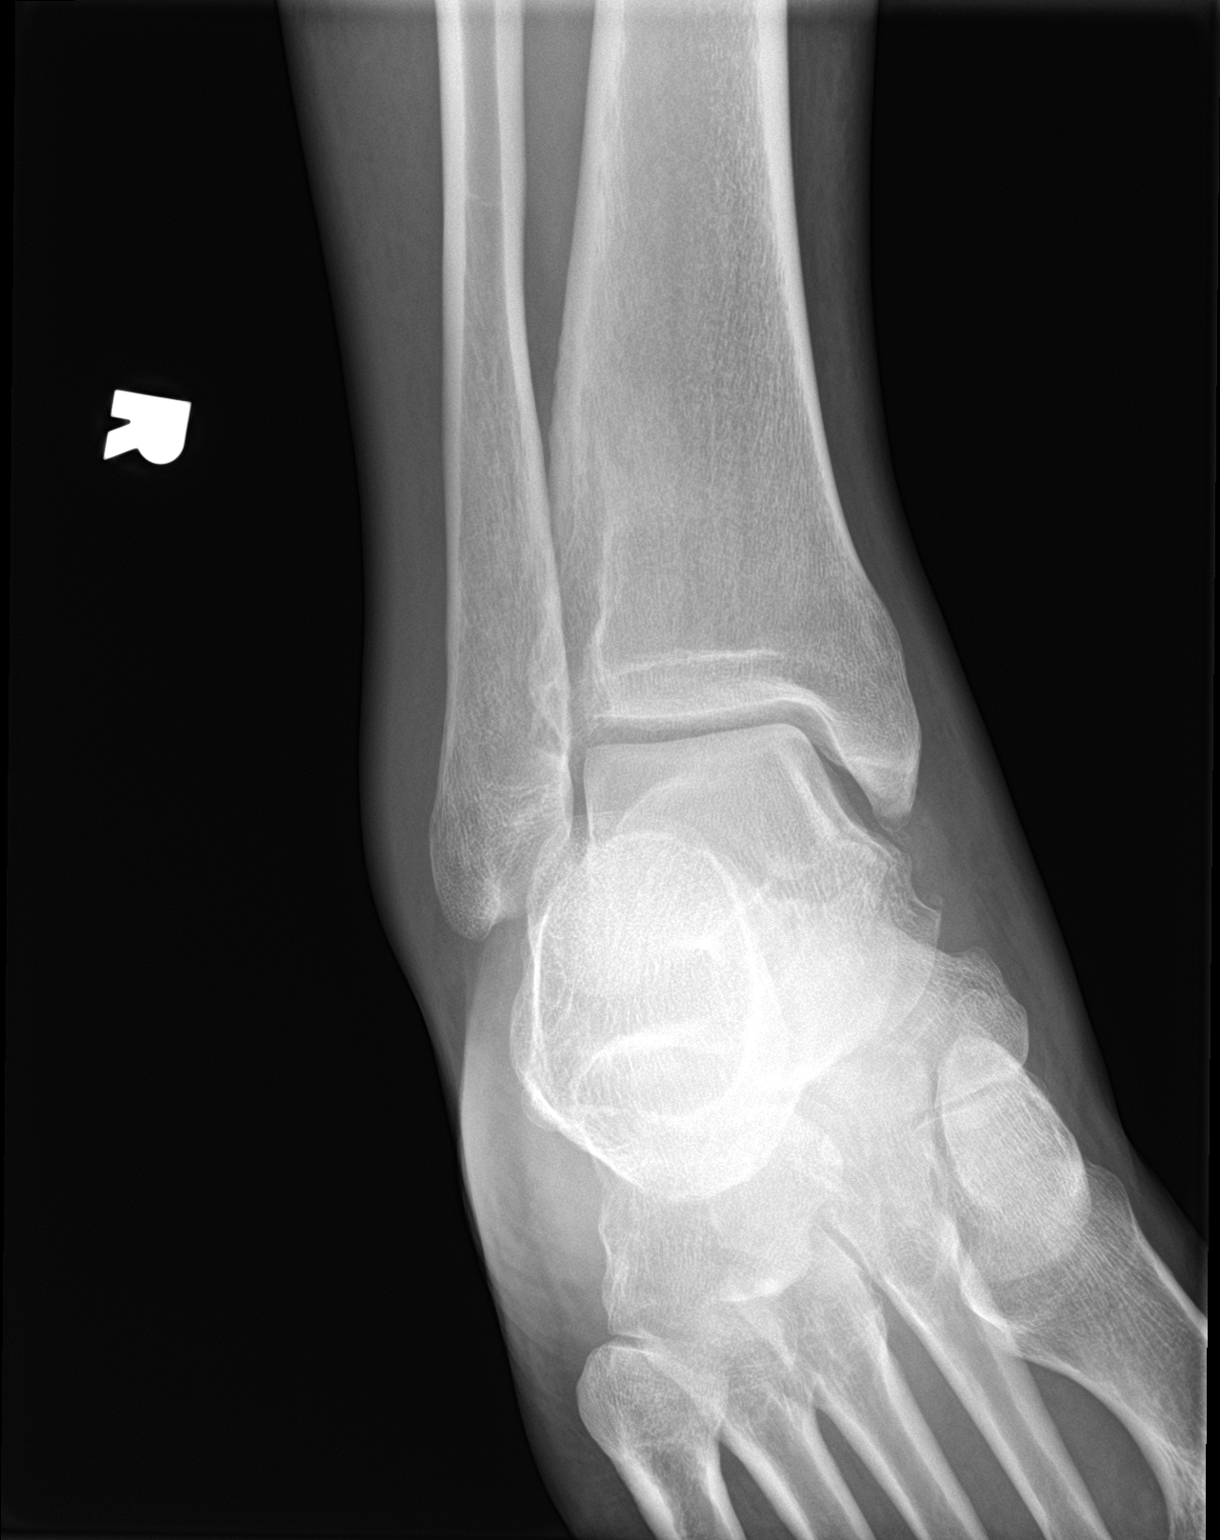

[ankle lat]
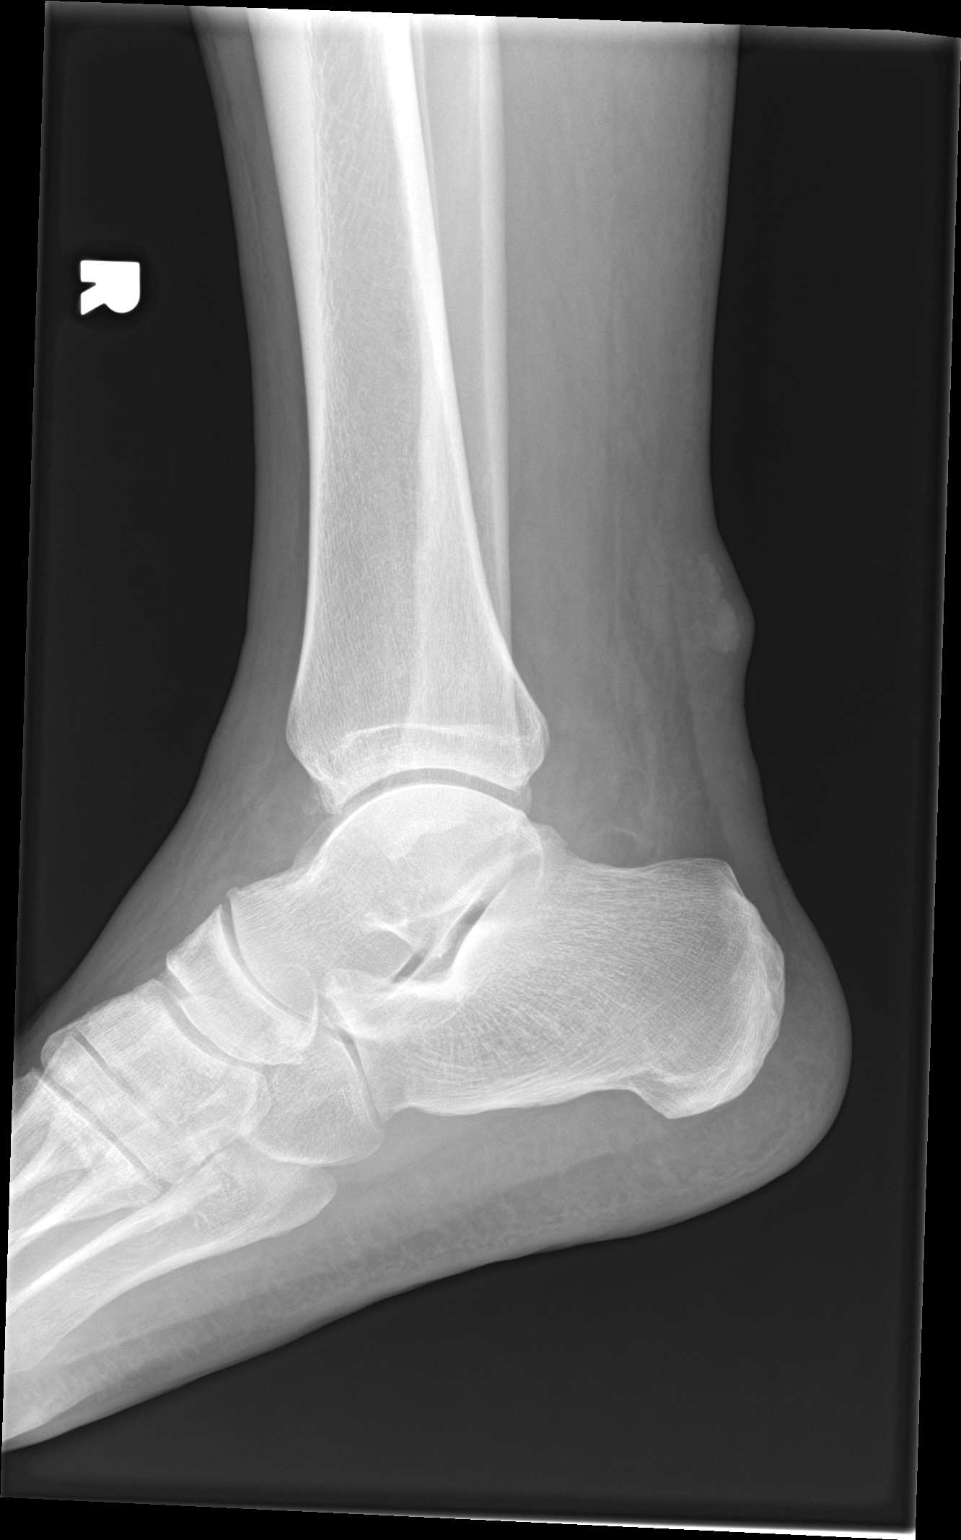

[3 of 3 positions shown; findings below may reference images not displayed]

FINDINGS: There is no acute fracture or dislocation. The bones are well
mineralized. No significant arthritic changes. The ankle mortise is
intact. Focal area of partial calcification involving the skin or
distal acute is tendon. Clinical correlation is recommended.
IMPRESSION: No acute fracture or dislocation.

## 2022-01-03 MED ORDER — PREDNISONE 5 MG PO TABS: ORAL_TABLET | ORAL | 0 refills | Status: DC

## 2022-01-03 NOTE — Assessment & Plan Note (Signed)
Acute swelling and pain as well as warmth at the ankle.  Most consistent with his previous gout attacks.  Has had repeated gout attacks over the past few months.  Has a worsening creatinine and has been followed by nephrology. ?-Counseled on supportive care. ?-Prednisone and counseled on its use.  Advised he may need to increase his insulin. ?-Referral to endocrinology for consideration of any other therapy for his gout. ?

## 2022-01-03 NOTE — Progress Notes (Signed)
?Yohannes Thomaston - 51 y.o. male MRN [id number]  Date of birth: [birth year] ? ?SUBJECTIVE:  Including CC & ROS.  ?No chief complaint on file. ? ? ?Romone Czaplewski is a 51 y.o. male that is presenting with acute on chronic bilateral knee pain and acute right ankle pain.  He has a history of 3 knee surgeries in the right knee and 2 knee surgeries in the left knee.  His last knee surgery was in 2015.  His pain is acutely occurred in the ankle as well as each knee.  He has had chronic knee pain since 1991.  The ankle pain has been ongoing for the past few weeks.  No improvement with medications today. ? ? ? ?Review of Systems ?See HPI  ? ?HISTORY: Past Medical, Surgical, Social, and Family History Reviewed & Updated per EMR.   ?Pertinent Historical Findings include: ? ?Past Medical History:  ?Diagnosis Date  ? Arthritis   ? CAD (coronary artery disease)   ? a. NSTEMI 10/2017 s/p multivessel PCI of the OM1, PDA, PLA ostium and mid PLA.  ? CKD (chronic kidney disease), stage II   ? GERD (gastroesophageal reflux disease)   ? Gout   ? Hypertension   ? Insulin dependent diabetes mellitus   ? ? ?Past Surgical History:  ?Procedure Laterality Date  ? CORONARY STENT INTERVENTION N/A 10/17/2017  ? Procedure: CORONARY STENT INTERVENTION;  Surgeon: Jettie Booze, MD;  Location: [facility];  Service: Cardiovascular;  Laterality: N/A;  ? FRACTURE SURGERY    ? right ring finger  ? KNEE ARTHROSCOPY WITH LATERAL MENISECTOMY Left 01/22/2013  ? Procedure: LEFT KNEE ARTHROSCOPY DEBRIDEMENT CHONDROPLASTY, LATERAL RELEASE AND partial medial meniscectomy;  Surgeon: Johnn Hai, MD;  Location: [facility];  Service: Orthopedics;  Laterality: Left;  ? KNEE SURGERY    ? "removed bone"  ? LEFT HEART CATH N/A 10/17/2017  ? Procedure: Left Heart Cath;  Surgeon: Jettie Booze, MD;  Location: [place];  Service: Cardiovascular;  Laterality: N/A;  ? LEFT HEART CATH AND CORONARY ANGIOGRAPHY N/A 10/15/2017  ? Procedure: LEFT HEART  CATH AND CORONARY ANGIOGRAPHY;  Surgeon: Leonie Man, MD;  Location: [place];  Service: Cardiovascular;  Laterality: N/A;  ? SHOULDER SURGERY    ? ? ? ?PHYSICAL EXAM:  ?VS: Ht 5\' 8"  (1.727 m)   Wt 217 lb (98.4 kg)   BMI 32.99 kg/m?  ?Physical Exam ?Gen: NAD, alert, cooperative with exam, well-appearing ?MSK:  ?Neurovascularly intact   ? ? ? ? ?ASSESSMENT & PLAN:  ? ?Acute gout due to renal impairment involving left knee ?Acute on chronic in nature.  He has a history of 2 previous knee surgeries.  Does have inflammatory changes appreciated previously.  Has degenerative changes as well.  He has tried injections in the past as well as greater than 6 weeks of physician directed home exercise program. ?-Counseled on home exercise therapy and supportive care. ?-X-ray. ?-MRI of the left knee to evaluate for degree of degenerative changes and for presurgical planning. ? ?Chronic gout of knee ?Acute on chronic in nature.  His right knee has had 3 previous surgeries.  Having chronic pain that dates back to the early 90s.  Has tried physical therapy and injections as well as over 6 weeks of physician directed home exercise therapy. ?-Counseled on home exercise therapy and supportive care. ?-MRI of the right knee to evaluate for degree of degenerative changes and for presurgical planning. ? ?Gout ?Acute  swelling and pain as well as warmth at the ankle.  Most consistent with his previous gout attacks.  Has had repeated gout attacks over the past few months.  Has a worsening creatinine and has been followed by nephrology. ?-Counseled on supportive care. ?-Prednisone and counseled on its use.  Advised he may need to increase his insulin. ?-Referral to endocrinology for consideration of any other therapy for his gout. ? ? ? ? ?

## 2022-01-03 NOTE — Assessment & Plan Note (Signed)
Acute on chronic in nature.  His right knee has had 3 previous surgeries.  Having chronic pain that dates back to the early 90s.  Has tried physical therapy and injections as well as over 6 weeks of physician directed home exercise therapy. ?-Counseled on home exercise therapy and supportive care. ?-MRI of the right knee to evaluate for degree of degenerative changes and for presurgical planning. ?

## 2022-01-03 NOTE — Assessment & Plan Note (Signed)
Acute on chronic in nature.  He has a history of 2 previous knee surgeries.  Does have inflammatory changes appreciated previously.  Has degenerative changes as well.  He has tried injections in the past as well as greater than 6 weeks of physician directed home exercise program. ?-Counseled on home exercise therapy and supportive care. ?-X-ray. ?-MRI of the left knee to evaluate for degree of degenerative changes and for presurgical planning. ?

## 2022-01-03 NOTE — Patient Instructions (Signed)
Good to see you ?Please check your sugars while on the prednisone. You may need to add units of insulin to keep your blood sugar less than 130 in the morning.  ?I have made a referral to endocrinology  ?I will call with the xray results.  ?We'll get MRIs of the knees at the [facility].   ?Please send me a message in MyChart with any questions or updates.  ?We'll set up a virtual visit once the MRIs are resulted.  ? ?--Dr. Jordan Likes ? ?

## 2022-01-07 ENCOUNTER — Ambulatory Visit (INDEPENDENT_AMBULATORY_CARE_PROVIDER_SITE_OTHER): Payer: Medicare Other

## 2022-01-07 DIAGNOSIS — M1A3610 Chronic gout due to renal impairment, right knee, without tophus (tophi): Secondary | ICD-10-CM

## 2022-01-07 DIAGNOSIS — M10362 Gout due to renal impairment, left knee: Secondary | ICD-10-CM

## 2022-01-07 IMAGING — MR MR KNEE*L* W/O CM
7 series · 40 of 40 positions shown · non-contrast
Comparison: None.

CLINICAL DATA: Chronic bilateral knee pain for 30 years.

EXAM:
MRI OF THE LEFT KNEE WITHOUT CONTRAST
TECHNIQUE: Multiplanar, multisequence MR imaging of the knee was performed. No
intravenous contrast was administered.

[Series 3: T2 fat-sat · axial · 4.0mm · 0.66mm/px · z∈[-89,+66]mm · 6 of 32 slices shown (1 of 3)]
[im 1/32]
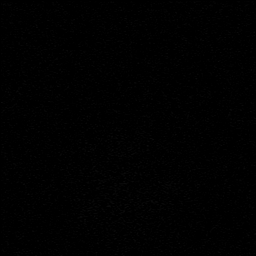
[im 7/32]
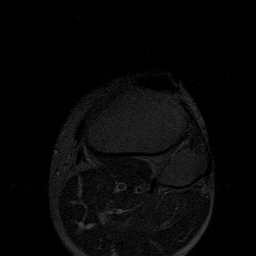
[im 13/32]
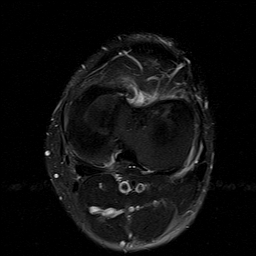
[im 19/32]
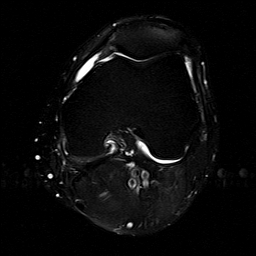
[im 25/32]
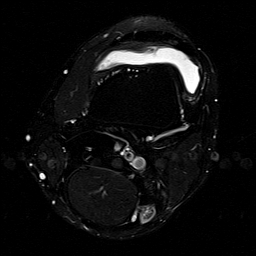
[im 32/32]
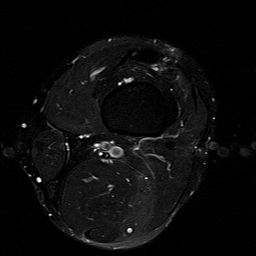

[Series 4: T1 · coronal · 4.0mm · 0.62mm/px · 6 of 31 slices shown]
[im 1/31]
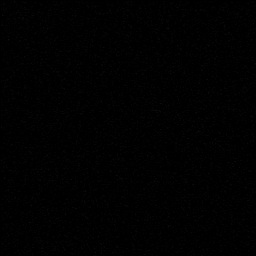
[im 7/31]
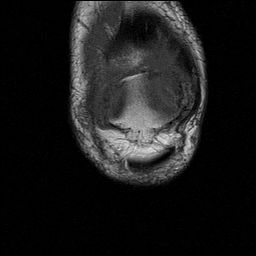
[im 13/31]
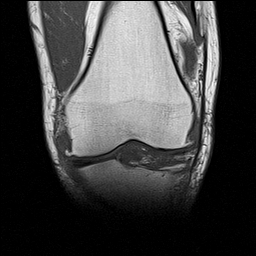
[im 19/31]
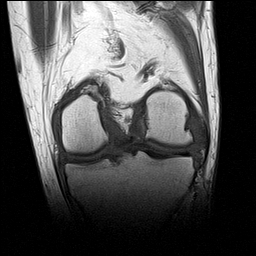
[im 25/31]
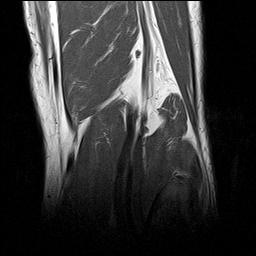
[im 31/31]
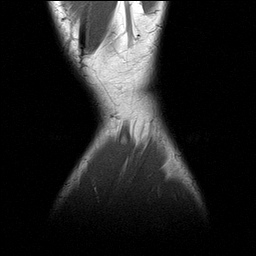

[Series 5: PD fat-sat · sagittal · 3.0mm · 0.62mm/px · 6 of 33 slices shown (1 of 3)]
[im 1/33]
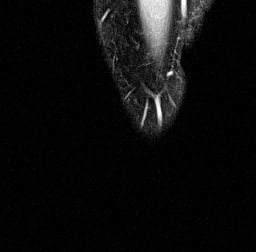
[im 7/33]
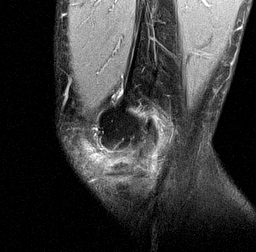
[im 13/33]
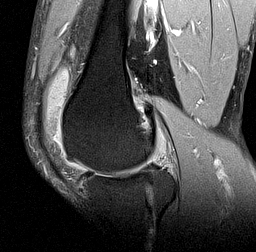
[im 20/33]
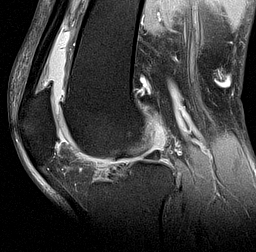
[im 26/33]
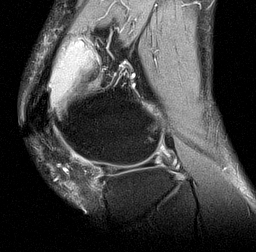
[im 33/33]
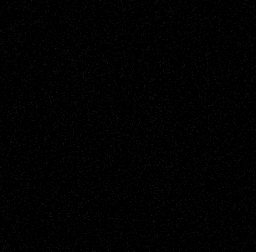

[Series 6: T2 fat-sat · coronal · 4.0mm · 0.62mm/px · 6 of 31 slices shown (2 of 3)]
[im 1/31]
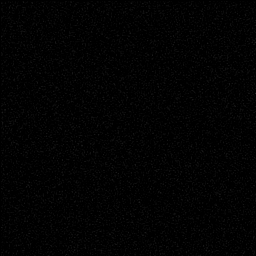
[im 7/31]
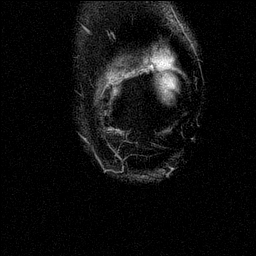
[im 13/31]
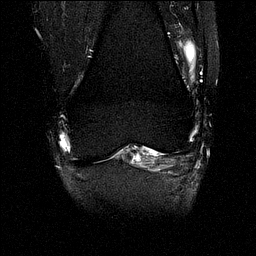
[im 19/31]
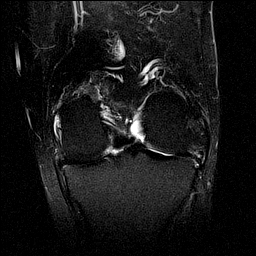
[im 25/31]
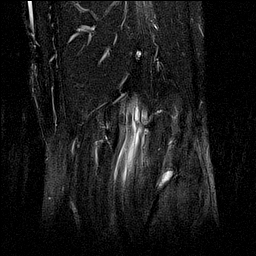
[im 31/31]
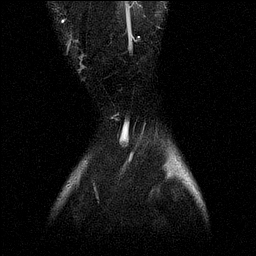

[Series 7: PD fat-sat · coronal · 4.0mm · 0.62mm/px · 6 of 31 slices shown (2 of 3)]
[im 1/31]
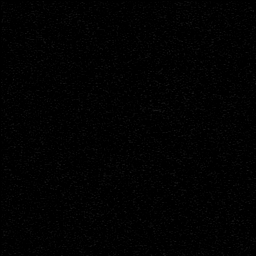
[im 7/31]
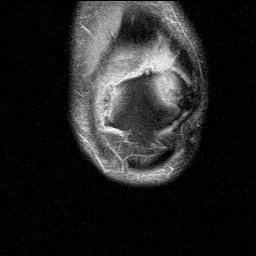
[im 13/31]
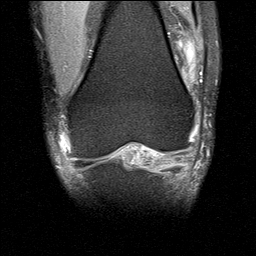
[im 19/31]
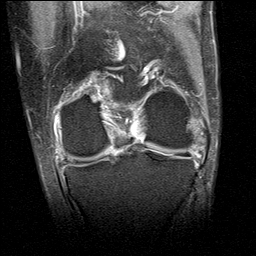
[im 25/31]
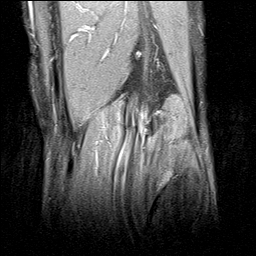
[im 31/31]
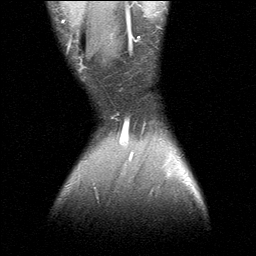

[Series 8: T2 fat-sat · sagittal · 3.0mm · 0.62mm/px · 6 of 33 slices shown (3 of 3)]
[im 1/33]
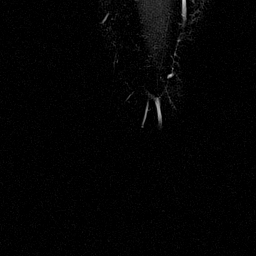
[im 7/33]
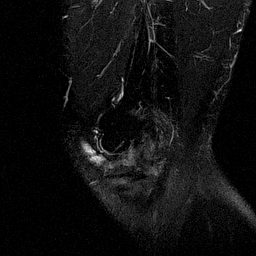
[im 13/33]
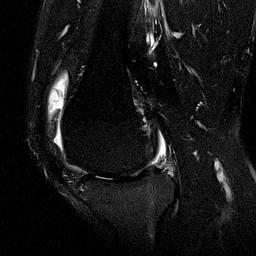
[im 20/33]
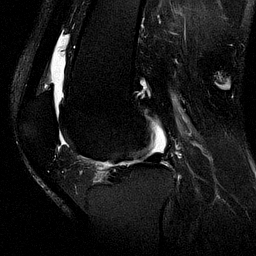
[im 26/33]
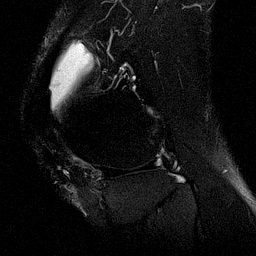
[im 33/33]
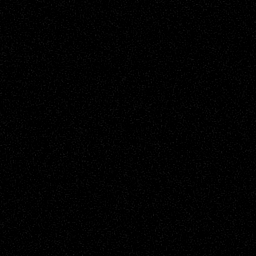

[Series 9: PD fat-sat · coronal · 2.0mm · 0.59mm/px · 4 of 19 slices shown (3 of 3)]
[im 1/19]
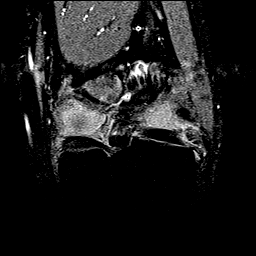
[im 7/19]
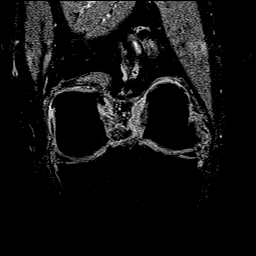
[im 13/19]
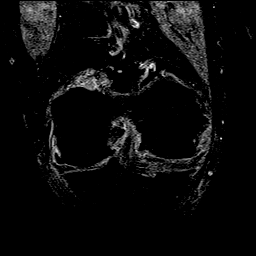
[im 19/19]
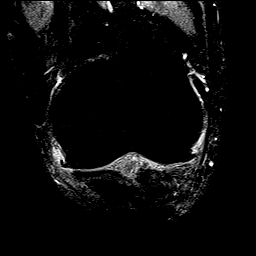

[40 of 40 positions shown; findings below may reference images not displayed]

FINDINGS: MENISCI

Medial: Complex tear of the posterior horn-body junction of medial
meniscus.

Lateral: Complex tear of the anterior horn of the lateral meniscus.

LIGAMENTS

Cruciates: ACL and PCL are intact.

Collaterals: Medial collateral ligament is intact. Lateral
collateral ligament complex is intact.

CARTILAGE

Patellofemoral: Partial-thickness cartilage loss of the
patellofemoral compartment with areas of high-grade
partial-thickness cartilage loss of the lateral patellofemoral
compartment. Medial and lateral patellofemoral compartment marginal
osteophytes.

Medial: Partial-thickness cartilage loss of the medial femorotibial
compartment with areas of high-grade partial-thickness cartilage
loss of the medial tibial plateau. Small marginal osteophytes.

Lateral: Partial-thickness cartilage loss of the lateral
femorotibial compartment with small marginal osteophytes.

JOINT: Large joint effusion. Mild edema in superolateral ROSE MERLINE
ROSE MERLINE. No plical thickening.

POPLITEAL FOSSA: Popliteus tendon is intact. No Baker's cyst.

EXTENSOR MECHANISM: Intact quadriceps tendon. Intact patellar
tendon. Intact lateral patellar retinaculum. Intact medial patellar
retinaculum. Intact MPFL.

BONES: No aggressive osseous lesion. No fracture or dislocation.

Other: No fluid collection or hematoma. Muscles are normal.
IMPRESSION: 1. Complex tear of the posterior horn-body junction of medial
meniscus.
2. Complex tear of the anterior horn of the lateral meniscus.
3. Tricompartmental cartilage abnormalities as described above.
4. Large joint effusion.
5. Mild edema in superolateral ROSE MERLINE as can be seen with
patellar tendon-lateral femoral condyle friction syndrome.

## 2022-01-07 IMAGING — MR MR KNEE*R* W/O CM
7 series · 40 of 40 positions shown · non-contrast
Comparison: None.

CLINICAL DATA: Bilateral knee pain for 30 years.  No known injury.

EXAM:
MRI OF THE RIGHT KNEE WITHOUT CONTRAST
TECHNIQUE: Multiplanar, multisequence MR imaging of the knee was performed. No
intravenous contrast was administered.

[Series 3: T2 fat-sat · axial · 4.0mm · 0.70mm/px · z∈[-74,+75]mm · 6 of 31 slices shown (1 of 3)]
[im 1/31]
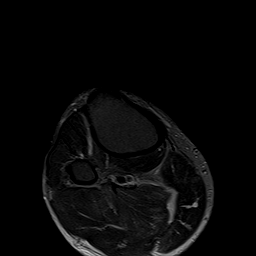
[im 7/31]
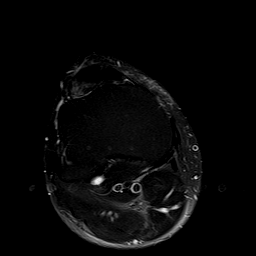
[im 13/31]
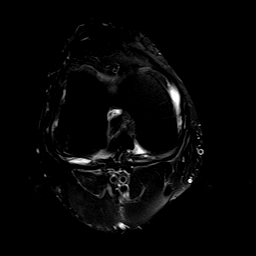
[im 19/31]
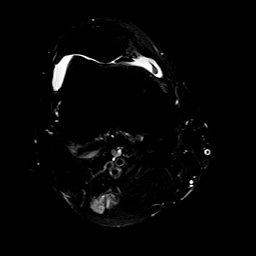
[im 25/31]
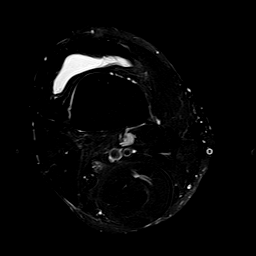
[im 31/31]
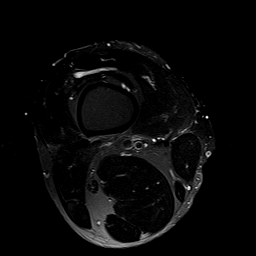

[Series 4: T1 · coronal · 4.0mm · 0.62mm/px · 6 of 31 slices shown]
[im 1/31]
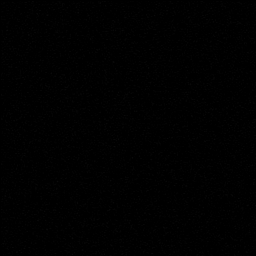
[im 7/31]
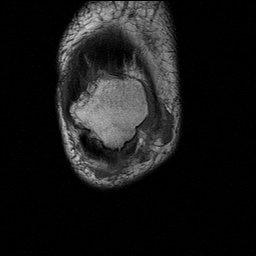
[im 13/31]
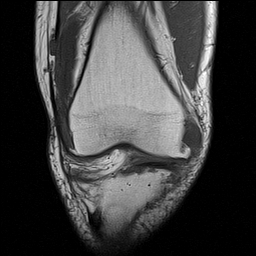
[im 19/31]
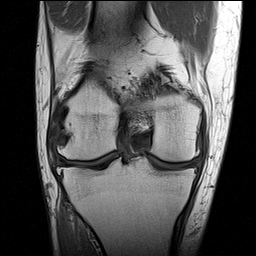
[im 25/31]
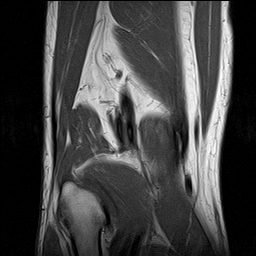
[im 31/31]
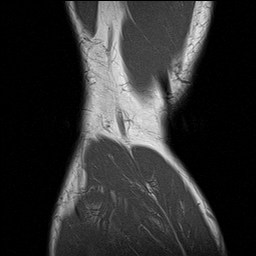

[Series 5: T2 fat-sat · sagittal · 3.0mm · 0.62mm/px · 6 of 33 slices shown (2 of 3)]
[im 1/33]
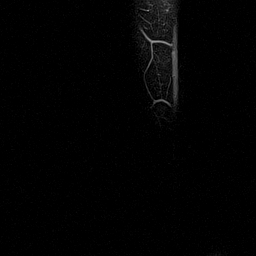
[im 7/33]
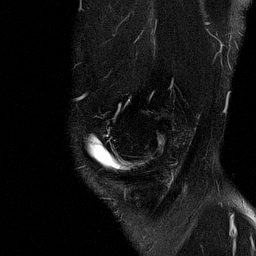
[im 13/33]
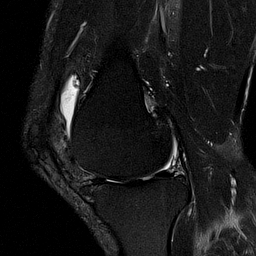
[im 20/33]
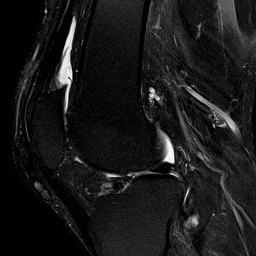
[im 26/33]
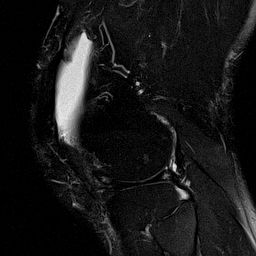
[im 33/33]
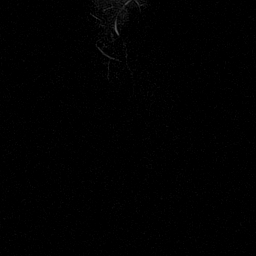

[Series 6: T2 fat-sat · coronal · 4.0mm · 0.62mm/px · 6 of 31 slices shown (3 of 3)]
[im 1/31]
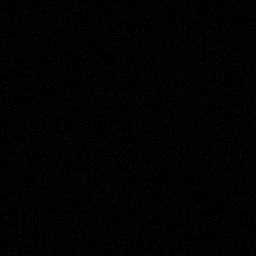
[im 7/31]
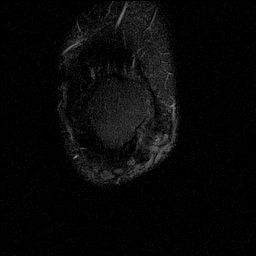
[im 13/31]
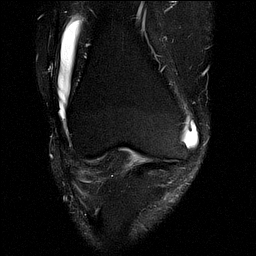
[im 19/31]
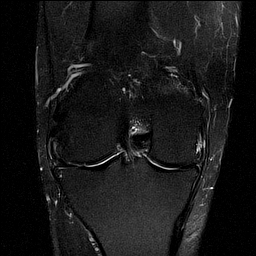
[im 25/31]
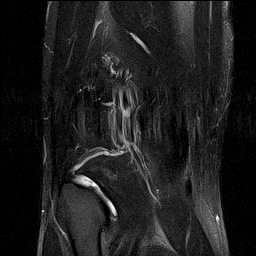
[im 31/31]
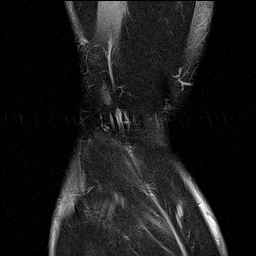

[Series 7: PD fat-sat · coronal · 4.0mm · 0.62mm/px · 6 of 31 slices shown (1 of 3)]
[im 1/31]
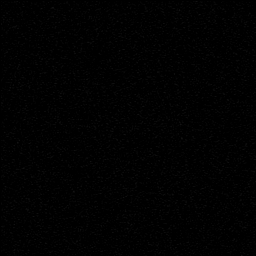
[im 7/31]
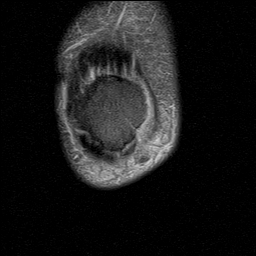
[im 13/31]
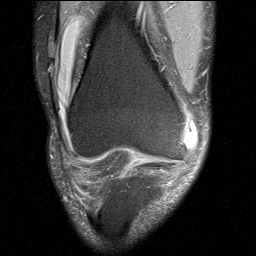
[im 19/31]
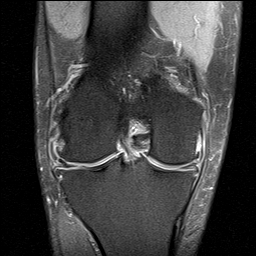
[im 25/31]
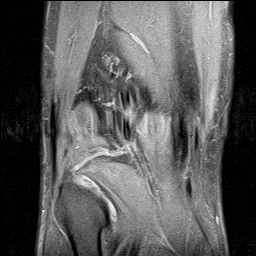
[im 31/31]
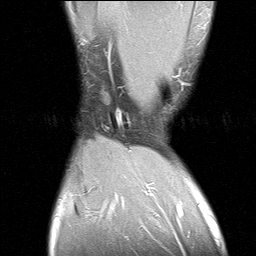

[Series 8: PD fat-sat · sagittal · 3.0mm · 0.62mm/px · 6 of 33 slices shown (2 of 3)]
[im 1/33]
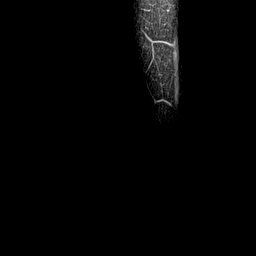
[im 7/33]
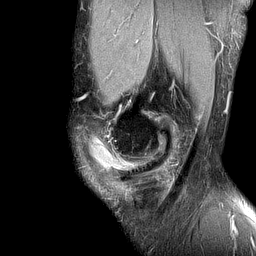
[im 13/33]
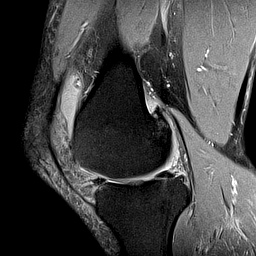
[im 20/33]
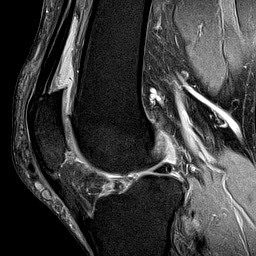
[im 26/33]
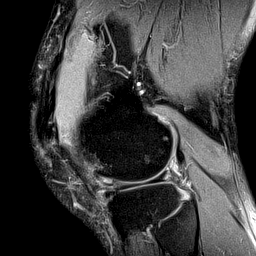
[im 33/33]
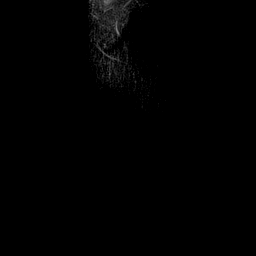

[Series 9: PD fat-sat · coronal · 2.0mm · 0.59mm/px · 4 of 19 slices shown (3 of 3)]
[im 1/19]
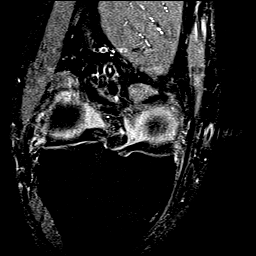
[im 7/19]
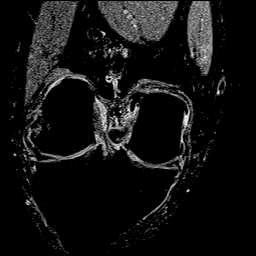
[im 13/19]
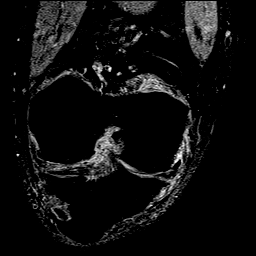
[im 19/19]
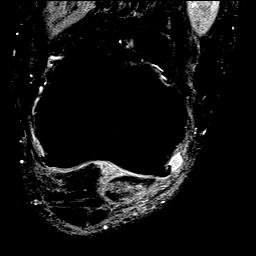

[40 of 40 positions shown; findings below may reference images not displayed]

FINDINGS: MENISCI

Medial: Complex tear of the body of the medial meniscus.

Lateral: Complex tear of the anterior horn of the lateral meniscus.

LIGAMENTS

Cruciates: ACL and PCL are intact.

Collaterals: Medial collateral ligament is intact. Lateral
collateral ligament complex is intact.

CARTILAGE

Patellofemoral: Partial-thickness cartilage loss of the
patellofemoral compartment with areas of full-thickness cartilage
loss of the lateral patellofemoral compartment. Medial and lateral
patellofemoral compartment marginal osteophytes.

Medial: Partial-thickness cartilage loss of the medial femorotibial
compartment with areas of high-grade partial-thickness cartilage
loss of the medial tibial plateau. Small marginal osteophytes.

Lateral: Partial-thickness cartilage loss of the lateral
femorotibial compartment with small marginal osteophytes.

JOINT: Large joint effusion. Mild edema in [place]. No
plical thickening.

POPLITEAL FOSSA: Popliteus tendon is intact. No Baker's cyst.

EXTENSOR MECHANISM: Intact quadriceps tendon. Intact patellar
tendon. Intact lateral patellar retinaculum. Intact medial patellar
retinaculum. Intact MPFL.

BONES: No aggressive osseous lesion. No fracture or dislocation.

Other: No fluid collection or hematoma. Muscles are normal.
IMPRESSION: 1. Complex tear of the body of the medial meniscus.
2. Complex tear of the anterior horn of the lateral meniscus.
3. Tricompartmental cartilage abnormalities as described above.
4. Large joint effusion.

## 2022-01-08 ENCOUNTER — Ambulatory Visit: Payer: Medicare Other | Admitting: Family Medicine

## 2022-01-09 ENCOUNTER — Telehealth: Payer: Self-pay | Admitting: Family Medicine

## 2022-01-09 ENCOUNTER — Encounter: Payer: Self-pay | Admitting: Family Medicine

## 2022-01-09 ENCOUNTER — Telehealth (INDEPENDENT_AMBULATORY_CARE_PROVIDER_SITE_OTHER): Payer: Medicare Other | Admitting: Family Medicine

## 2022-01-09 VITALS — Ht 68.0 in | Wt 217.0 lb

## 2022-01-09 DIAGNOSIS — M170 Bilateral primary osteoarthritis of knee: Secondary | ICD-10-CM | POA: Insufficient documentation

## 2022-01-09 NOTE — Assessment & Plan Note (Signed)
Acute on chronic in nature.  He has a longstanding history of knee pain that stems back to the early 90s.  He has had 3 knee surgeries on the right knee and 2 on the left.  The MRI was demonstrating high-grade partial cartilage loss in both knees.  We have tried physical therapy as well as several injections.  He has diabetes as well as chronic kidney disease so trying to limit injections and medications that are nephrotoxic. ?-Counseled on home exercise therapy and supportive care. ?-Referral to orthopedic surgery. ? ?

## 2022-01-09 NOTE — Progress Notes (Signed)
Virtual Visit via Video Note ? ?I connected with Matthew Fisher on 01/09/22 at  8:00 AM EDT by a video enabled telemedicine application and verified that I am speaking with the correct person using two identifiers. ? ?Location: ?Patient: home ?Provider: office ?  ?I discussed the limitations of evaluation and management by telemedicine and the availability of in person appointments. The patient expressed understanding and agreed to proceed. ? ?History of Present Illness: ? ?Matthew Fisher is a 51 year old male that is following up after the MRI of his left and right knee.  The MRI of the left knee was demonstrating complex tear of the posterior horn of the medial meniscus as well as partial-thickness cartilage loss of the medial femoral compartment.  The MRI of the right knee was demonstrating complex tear of the body of the medial meniscus with partial thickness high-grade cartilage loss of the medial tibial plateau. ?  ?Observations/Objective: ? ? ?Assessment and Plan: ? 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  He has a longstanding history of knee pain that stems back to the early 90s.  He has had 3 knee surgeries on the right knee and 2 on the left.  The MRI was demonstrating high-grade partial cartilage loss in both knees.  We have tried physical therapy as well as several injections.  He has diabetes as well as chronic kidney disease so trying to limit injections and medications that are nephrotoxic. ?-Counseled on home exercise therapy and supportive care. ?-Referral to orthopedic surgery. ? ?Follow Up Instructions: ? ?  ?I discussed the assessment and treatment plan with the patient. The patient was provided an opportunity to ask questions and all were answered. The patient agreed with the plan and demonstrated an understanding of the instructions. ?  ?The patient was advised to call back or seek an in-person evaluation if the symptoms worsen or if the condition fails to  improve as anticipated. ? ? ?Clearance Coots, MD ? ? ?

## 2022-01-09 NOTE — Telephone Encounter (Signed)
Pt's rcvd a bill from [employer] asking for payment or Updated Ins information--Pt sent bill to Dr. Raeford Razor office instead of contacting [employer] as directed to give them Ins info. ? ?--I will contact [facility]@ [phone] to give information from  ?DOS 09/28/21  for chrgs [id number]  ? ?--glh ?

## 2022-01-12 ENCOUNTER — Telehealth (HOSPITAL_COMMUNITY): Payer: Self-pay | Admitting: *Deleted

## 2022-01-12 NOTE — Telephone Encounter (Signed)
Patient given detailed instructions per Myocardial Perfusion Study Information Sheet for the test on 01/19/2022 at 7:30. Patient notified to arrive 15 minutes early and that it is imperative to arrive on time for appointment to keep from having the test rescheduled. ? If you need to cancel or reschedule your appointment, please call the office within 24 hours of your appointment. . Patient verbalized understanding.Nelson Chimes S ? ? ?

## 2022-01-16 ENCOUNTER — Encounter (HOSPITAL_COMMUNITY): Payer: Medicare Other

## 2022-01-16 ENCOUNTER — Other Ambulatory Visit (HOSPITAL_COMMUNITY): Payer: Medicare Other

## 2022-01-16 ENCOUNTER — Ambulatory Visit (HOSPITAL_COMMUNITY): Payer: Medicare Other | Attending: Cardiovascular Disease

## 2022-01-16 DIAGNOSIS — R079 Chest pain, unspecified: Secondary | ICD-10-CM | POA: Diagnosis present

## 2022-01-16 LAB — MYOCARDIAL PERFUSION IMAGING
Angina Index: 0
Duke Treadmill Score: 7
Estimated workload: 8.4
Exercise duration (min): 7 min
LV dias vol: 112 mL (ref 62–150)
LV sys vol: 48 mL
MPHR: 168 {beats}/min
Nuc Stress EF: 57 %
Peak HR: 144 {beats}/min
Percent HR: 85 %
Rest HR: 83 {beats}/min
Rest Nuclear Isotope Dose: 10.7 mCi
SDS: 0
SRS: 0
SSS: 0
ST Depression (mm): 0 mm
Stress Nuclear Isotope Dose: 32.9 mCi
TID: 0.96

## 2022-01-16 MED ORDER — TECHNETIUM TC 99M TETROFOSMIN IV KIT
10.7000 | PACK | Freq: Once | INTRAVENOUS | Status: AC | PRN
  Administered 2022-01-16: 10.7 via INTRAVENOUS
  Filled 2022-01-16: qty 11

## 2022-01-16 MED ORDER — TECHNETIUM TC 99M TETROFOSMIN IV KIT
32.9000 | PACK | Freq: Once | INTRAVENOUS | Status: AC | PRN
  Administered 2022-01-16: 32.9 via INTRAVENOUS
  Filled 2022-01-16: qty 33

## 2022-01-19 ENCOUNTER — Encounter (HOSPITAL_COMMUNITY): Payer: Medicare Other

## 2022-01-22 ENCOUNTER — Ambulatory Visit (HOSPITAL_BASED_OUTPATIENT_CLINIC_OR_DEPARTMENT_OTHER)
Admission: RE | Admit: 2022-01-22 | Discharge: 2022-01-22 | Disposition: A | Discharge status: Home / Self Care | Payer: Medicare Other | Source: Ambulatory Visit | Attending: Cardiology | Admitting: Cardiology

## 2022-01-22 ENCOUNTER — Telehealth: Payer: Self-pay | Admitting: Family Medicine

## 2022-01-22 ENCOUNTER — Other Ambulatory Visit (HOSPITAL_COMMUNITY): Payer: Medicare Other

## 2022-01-22 DIAGNOSIS — E782 Mixed hyperlipidemia: Secondary | ICD-10-CM | POA: Diagnosis present

## 2022-01-22 DIAGNOSIS — R079 Chest pain, unspecified: Secondary | ICD-10-CM | POA: Diagnosis present

## 2022-01-22 DIAGNOSIS — I10 Essential (primary) hypertension: Secondary | ICD-10-CM | POA: Diagnosis present

## 2022-01-22 DIAGNOSIS — M10371 Gout due to renal impairment, right ankle and foot: Secondary | ICD-10-CM

## 2022-01-22 DIAGNOSIS — I2510 Atherosclerotic heart disease of native coronary artery without angina pectoris: Secondary | ICD-10-CM | POA: Diagnosis present

## 2022-01-22 DIAGNOSIS — R0609 Other forms of dyspnea: Secondary | ICD-10-CM

## 2022-01-22 DIAGNOSIS — E1165 Type 2 diabetes mellitus with hyperglycemia: Secondary | ICD-10-CM

## 2022-01-22 DIAGNOSIS — Z720 Tobacco use: Secondary | ICD-10-CM

## 2022-01-22 NOTE — Telephone Encounter (Incomplete)
Patient's wife states they are unable to get an appt w/ Endocrinologist until Oct( Endo put a response to referral in the communication block) on 01/17/22.(Advised them we were out of office 4/11-4/17) ?[id number]  ?Endocrinology  ? ? Per  ?  ?{The office is unable to accommodate urgent referrals at this time. Currently scheduling in October, with only 1 provider accepting new patients. } ? ?Pt wants to know what next, can a referral be sent to an Outside [facility] provider ? Or Is he okay to wait the an [facility] Endo doctor can see him?  ? ?--forwarding message to  ? ?--glh ?

## 2022-01-23 ENCOUNTER — Telehealth: Payer: Self-pay | Admitting: Family Medicine

## 2022-01-23 ENCOUNTER — Telehealth: Payer: Self-pay

## 2022-01-23 LAB — ECHOCARDIOGRAM COMPLETE
AR max vel: 2.23 cm2
AV Area VTI: 2.28 cm2
AV Area mean vel: 2.18 cm2
AV Mean grad: 3 mmHg
AV Peak grad: 6.2 mmHg
Ao pk vel: 1.24 m/s
Area-P 1/2: 3.21 cm2
S' Lateral: 3.3 cm

## 2022-01-23 NOTE — Telephone Encounter (Signed)
I s/w the requesting office and confirmed information. ? ?ADDENDUM TO CLEARANCE REQUEST: ? ?PROCEDURE: PARS-PLANA VITRECTOMY ?SURGEON: DR. BARTLETT HAYES, II ?ANESTHESIA: GENERAL ?DATE OF PROCEDURE: TBD, THOUGH WOULD LIKE TO TO BY THE END OF April 2023 ?

## 2022-01-23 NOTE — Telephone Encounter (Signed)
Left message for requesting office to call back and advise if pt needs to hold ASA or if ok to remain on ASA.  ?

## 2022-01-23 NOTE — Telephone Encounter (Signed)
? ?  Pre-operative Risk Assessment  ?  ?Patient Name: Matthew Fisher  ?DOB: December 28, 1970 ?MRN: [id number]  ? ?  ? ?Request for Surgical Clearance   ? ?Procedure:   eye surgery ? ?Date of Surgery:  Clearance TBD                              ?   ?Surgeon:   ?Surgeon's Group or Practice Name:  [facility] ?Phone number:  [phone] ?Fax number:  [phone] or [phone] ?  ?Type of Clearance Requested:   ?- Medical  ?  ?Type of Anesthesia:  Not Indicated ?  ?Additional requests/questions:   ? ?Signed, ?Rola Lennon Myrtie Soman   ?01/23/2022, 11:34 AM  ? ?

## 2022-01-23 NOTE — Telephone Encounter (Signed)
Robin with requesting office called and stated notes from [facility] in their office , ok to take ASA for procedure. Shirlean Mylar said generally if it is ASA 81 mg they are ok to remain on ASA for procedure. I did confirm it is ASA 81 mg. I will update the pre op provider.  ?

## 2022-01-23 NOTE — Telephone Encounter (Signed)
Called pt to give referral info regarding Dx --Provider has sent order to Rheumatology 01/22/22--gv pt's Dr.Deveshwar's office contact info & advised him to contact their office for scheduling. ?-glh ?

## 2022-01-23 NOTE — Telephone Encounter (Signed)
? ?  Patient Name: Matthew Fisher  ?DOB: September 13, 1971 ?MRN: [id number] ? ?Primary Cardiologist: Gypsy Balsam, MD ? ?Chart reviewed as part of pre-operative protocol coverage. Patient recently seen 12/25/21 by Dr. Bing Matter with known history of CAD, DM, CKD, GERD, HTN. He had NSTEMI 10/2017 s/p multivessel PCI of the OM1, PDA, PLA ostium and mid PLA. At recent OV he was complaining of exertional shortness of breath as well as bendopnea. Nuclear stress test 01/16/22 was normal. 2D echo 01/22/22 showed EF 55-60%, g1 DD, normal RV, moderate LAE, awaiting MD review and further recommendations. ? ?Given recent symptoms we are unable to clear by protocol so will route to MD whether patient may proceed with eye surgery (pars plana vitrectomy) under general anesthesia, planned for by the end of April. They do not need to hold aspirin. Dr. Bing Matter - Please route response to [facility] PREOP (the pre-op pool). Thank you. ? ?Laurann Montana, PA-C ?01/23/2022, 2:24 PM ? ? ?

## 2022-01-23 NOTE — Telephone Encounter (Addendum)
Not enough details on clearance request. Please get more details (including date, only listed as 01/2022 by our intake) then will need to review with MD given recent symptoms/ testing. ?

## 2022-01-24 ENCOUNTER — Encounter: Payer: Self-pay | Admitting: Orthopedic Surgery

## 2022-01-24 ENCOUNTER — Ambulatory Visit (INDEPENDENT_AMBULATORY_CARE_PROVIDER_SITE_OTHER): Payer: Medicare Other | Admitting: Orthopedic Surgery

## 2022-01-24 VITALS — Ht 68.0 in | Wt 226.0 lb

## 2022-01-24 DIAGNOSIS — M170 Bilateral primary osteoarthritis of knee: Secondary | ICD-10-CM | POA: Diagnosis not present

## 2022-01-25 ENCOUNTER — Encounter: Payer: Self-pay | Admitting: Orthopedic Surgery

## 2022-01-25 NOTE — Progress Notes (Signed)
? ?Office Visit Note ?  ?Patient: Matthew Fisher           ?Date of Birth: 10-05-1971           ?MRN: [id number] ?Visit Date: 01/24/2022 ?Requested by: Rosemarie Ax, MD ?[address] ?[place],  [place] [address] ?PCP: Terrilyn Saver, NP ? ?Subjective: ?Chief Complaint  ?Patient presents with  ? Left Knee - New Patient (Initial Visit)  ? Right Knee - New Patient (Initial Visit)  ? ? ?HPI: Matthew Fisher is a 51 year old patient with bilateral knee pain.  Knee pain has been going on since 1991.  Had football injuries.  Has had multiple surgeries on both the right and left knee.  Right knee had arthroscopic surgery in 1995 again and then the second and third surgery several years thereafter.  Left knee arthroscopic surgery performed in 2014 and 2015.  Patient states that none of those surgeries really helped his knees very much.  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  Pain will occasionally wake him from sleep at night.  He has 1/2 mile of walking endurance.  He has tried cortisone injections as well as gel injections which have not helped.  He also has a history of gout.  Knees stiffen up after he sits for long period of time.  He has had MRI scans which are reviewed along with plain radiographs. ?             ?ROS: All systems reviewed are negative as they relate to the chief complaint within the history of present illness.  Patient denies  fevers or chills. ? ? ?Assessment & Plan: ?Visit Diagnoses:  ?1. Primary osteoarthritis of both knees   ? ? ?Plan: Impression is bilateral knee arthritis in a patient who has failed conservative measures.  He has significant end-stage tricompartmental knee arthritis in both knees.  Arthroscopic intervention not indicated for treatment of any symptoms at this time for right knee.  Last hemoglobin A1c 9.7 in February.  He will need to get that down prior to knee replacement.  I do think he is a candidate for knee replacement  even though he is young.  Discussed with him today at length the risk and benefits of knee replacement at a young age particularly with his increased risk of infection both at x0 as well as the cumulative risk of infection over the lifetime of the implants.  He would be a good candidate for press-fit knee replacement which I think could give him a longer lasting knee.  We will see him back in 3 months for clinical recheck and to check to see if his hemoglobin A1c is 7.8 or less so we can proceed with knee replacement.  Follow-up at that time.  57-month handicap sticker provided ? ?Follow-Up Instructions: Return in about 3 months (around 04/25/2022).  ? ?Orders:  ?No orders of the defined types were placed in this encounter. ? ?No orders of the defined types were placed in this encounter. ? ? ? ? Procedures: ?No procedures performed ? ? ?Clinical Data: ?No additional findings. ? ?Objective: ?Vital Signs: Ht 5\' 8"  (1.727 m)   Wt 226 lb (102.5 kg)   BMI 34.36 kg/m?  ? ?Physical Exam:  ? ?Constitutional: Patient appears well-developed ?HEENT:  ?Head: Normocephalic ?Eyes:EOM are normal ?Neck: Normal range of motion ?Cardiovascular: Normal rate ?Pulmonary/chest: Effort normal ?Neurologic: Patient is alert ?Skin: Skin is warm ?Psychiatric:  Patient has normal mood and affect ? ? ?Ortho Exam: Ortho exam demonstrates palpable pedal pulses bilaterally.  Slight varus alignment in both knees is present.  Range of motion in both knees is approximately 5 to 100 degrees.  Collateral and cruciate ligaments are stable.  Mild effusion present more in the left knee than the right.  Extensor mechanism intact.  Patient has medial and lateral joint line tenderness. ? ?Specialty Comments:  ?No specialty comments available. ? ?Imaging: ?No results found. ? ? ?PMFS History: ?Patient Active Problem List  ? Diagnosis Date Noted  ? Primary osteoarthritis of both knees 01/09/2022  ? Gout 12/21/2021  ? GERD (gastroesophageal reflux disease)  12/21/2021  ? Arthritis 12/21/2021  ? Gastroesophageal reflux disease with esophagitis 12/04/2021  ? Chronic gout of knee 12/04/2021  ? Mixed hyperlipidemia 12/04/2021  ? Iron deficiency 12/04/2021  ? Acute gout due to renal impairment involving left knee 09/28/2021  ? Synovitis of right knee 09/28/2021  ? CHF (congestive heart failure) ([place]) 09/11/2019  ? Nonadherence to medical treatment   ? Tobacco abuse disorder   ? CAD (coronary artery disease) 10/18/2017  ? CKD (chronic kidney disease), stage II 10/18/2017  ? NSTEMI (non-ST elevated myocardial infarction) ([place]) 10/15/2017  ? Type 2 diabetes mellitus with hyperglycemia, without long-term current use of insulin ([place]) 10/15/2017  ? Hypertension 10/15/2017  ? Gastroparesis due to secondary diabetes ([place]) 10/15/2017  ? Hypertensive emergency, no CHF   ? ?Past Medical History:  ?Diagnosis Date  ? Arthritis   ? CAD (coronary artery disease)   ? a. NSTEMI 10/2017 s/p multivessel PCI of the OM1, PDA, PLA ostium and mid PLA.  ? CKD (chronic kidney disease), stage II   ? GERD (gastroesophageal reflux disease)   ? Gout   ? Hypertension   ? Insulin dependent diabetes mellitus   ?  ?Family History  ?Problem Relation Age of Onset  ? Diabetes Mother   ? Cancer Father   ?  ?Past Surgical History:  ?Procedure Laterality Date  ? CORONARY STENT INTERVENTION N/A 10/17/2017  ? Procedure: CORONARY STENT INTERVENTION;  Surgeon: Jettie Booze, MD;  Location: [facility];  Service: Cardiovascular;  Laterality: N/A;  ? FRACTURE SURGERY    ? right ring finger  ? KNEE ARTHROSCOPY WITH LATERAL MENISECTOMY Left 01/22/2013  ? Procedure: LEFT KNEE ARTHROSCOPY DEBRIDEMENT CHONDROPLASTY, LATERAL RELEASE AND partial medial meniscectomy;  Surgeon: Johnn Hai, MD;  Location: [facility];  Service: Orthopedics;  Laterality: Left;  ? KNEE SURGERY    ? "removed bone"  ? LEFT HEART CATH N/A 10/17/2017  ? Procedure: Left Heart Cath;  Surgeon: Jettie Booze, MD;  Location: [facility];  Service: Cardiovascular;  Laterality: N/A;  ? LEFT HEART CATH AND CORONARY ANGIOGRAPHY N/A 10/15/2017  ? Procedure: LEFT HEART CATH AND CORONARY ANGIOGRAPHY;  Surgeon: Leonie Man, MD;  Location: [facility];  Service: Cardiovascular;  Laterality: N/A;  ? SHOULDER SURGERY    ? ?Social History  ? ?Occupational History  ? Not on file  ?Tobacco Use  ? Smoking status: Former  ?  Packs/day: 0.50  ?  Years: 20.00  ?  Pack years: 10.00  ?  Types: Cigarettes, Cigars  ?  Quit date: 10/08/2002  ?  Years since quitting: 19.3  ? Smokeless tobacco: Never  ?Vaping Use  ? Vaping Use: Never used  ?Substance and Sexual Activity  ? Alcohol use: Yes  ?  Comment: occasional  ? Drug use:  No  ? Sexual activity: Yes  ? ? ? ? ? ?

## 2022-02-05 ENCOUNTER — Telehealth: Payer: Self-pay

## 2022-02-05 NOTE — Telephone Encounter (Signed)
Received the following message from the patient engagement center: ? ? "By any chance are you available to speak with Coralee North from [facility] regarding this patient? she states patient's wife advised that patient will need a sleep study, but she doesn't see that Dr. Kirtland Bouchard ordered one. there's also no mention of a sleep study in 3/20 OV notes". ? ?Attempted to call the patient to get more information. Patient did not answer. Left message for the patient to call back. ?

## 2022-02-05 NOTE — Telephone Encounter (Addendum)
Covering preop today. Initial note routed to MD 4/18 for input, no reply yet, will re-route to MD for input to ensure received. ?

## 2022-02-07 NOTE — Telephone Encounter (Signed)
? ?  Patient Name: Matthew Fisher  ?DOB: 1971/01/05 ?MRN: [id number] ? ?Primary Cardiologist: Gypsy Balsam, MD ? ?Chart reviewed as part of pre-operative protocol coverage. Per Dr. Bing Matter, he feels patient is fine to proceed with surgery. As outlined below, requesting office indicated they did not need to hold ASA for procedure. ? ?Will route this bundled recommendation to requesting provider via Epic fax function. Please call with questions. ? ?Laurann Montana, PA-C ?02/07/2022, 3:20 PM ? ? ?

## 2022-02-09 ENCOUNTER — Encounter: Payer: Self-pay | Admitting: Family Medicine

## 2022-02-21 ENCOUNTER — Other Ambulatory Visit: Payer: Self-pay

## 2022-02-21 DIAGNOSIS — R079 Chest pain, unspecified: Secondary | ICD-10-CM

## 2022-02-21 DIAGNOSIS — Z720 Tobacco use: Secondary | ICD-10-CM

## 2022-02-21 DIAGNOSIS — I2510 Atherosclerotic heart disease of native coronary artery without angina pectoris: Secondary | ICD-10-CM

## 2022-02-21 DIAGNOSIS — E782 Mixed hyperlipidemia: Secondary | ICD-10-CM

## 2022-02-21 DIAGNOSIS — I10 Essential (primary) hypertension: Secondary | ICD-10-CM

## 2022-02-21 DIAGNOSIS — E1165 Type 2 diabetes mellitus with hyperglycemia: Secondary | ICD-10-CM

## 2022-02-22 LAB — LIPID PANEL
Chol/HDL Ratio: 3.4 ratio (ref 0.0–5.0)
Cholesterol, Total: 101 mg/dL (ref 100–199)
HDL: 30 mg/dL — ABNORMAL LOW (ref >39)
LDL Chol Calc (NIH): 41 mg/dL (ref 0–99)
Triglycerides: 179 mg/dL — ABNORMAL HIGH (ref 0–149)
VLDL Cholesterol Cal: 30 mg/dL (ref 5–40)

## 2022-02-27 ENCOUNTER — Telehealth: Payer: Self-pay

## 2022-02-27 NOTE — Telephone Encounter (Signed)
-----   Message from Georgeanna Lea, MD sent at 02/23/2022  9:48 AM EDT ----- Cholesterol looks good, continue present management

## 2022-02-27 NOTE — Telephone Encounter (Signed)
Spoke with Alcario Drought, notified of results

## 2022-02-28 ENCOUNTER — Ambulatory Visit: Payer: Medicare Other | Admitting: Cardiology

## 2022-02-28 ENCOUNTER — Encounter: Payer: Self-pay | Admitting: Cardiology

## 2022-02-28 VITALS — BP 132/78 | HR 81 | Ht 68.0 in | Wt 216.0 lb

## 2022-02-28 DIAGNOSIS — E782 Mixed hyperlipidemia: Secondary | ICD-10-CM | POA: Diagnosis not present

## 2022-02-28 DIAGNOSIS — N182 Chronic kidney disease, stage 2 (mild): Secondary | ICD-10-CM

## 2022-02-28 DIAGNOSIS — I2510 Atherosclerotic heart disease of native coronary artery without angina pectoris: Secondary | ICD-10-CM | POA: Diagnosis not present

## 2022-02-28 DIAGNOSIS — E1165 Type 2 diabetes mellitus with hyperglycemia: Secondary | ICD-10-CM | POA: Diagnosis not present

## 2022-02-28 NOTE — Progress Notes (Signed)
Cardiology Office Note:    Date:  02/28/2022   ID:  Matthew Fisher, DOB 04-28-1971, MRN [id number]  PCP:  Terrilyn Saver, NP  Cardiologist:  Jenne Campus, MD    Referring MD: Terrilyn Saver, NP   Chief Complaint  Patient presents with   Follow-up  Doing fine  History of Present Illness:    Matthew Fisher is a 51 y.o. male with past medical history significant for coronary artery disease in 2019 he presented with non-STEMI peak troponin was 8.17, cardiac catheterization has been performed which showed completely occluded distal LAD he did have PTCA and stenting of the obtuse marginal branch as well as PDA and PLA he was put on dual antiplatelet therapy for at least 1 year.  Now he is on monotherapy.  He came to [place] because of shortness of breath and chest pain, stress test has been performed in March 2023 which showed no evidence of ischemia.  His past medical history also significant for type 2 diabetes which is poorly controlled chronic kidney failure with creatinine 2.5 he also got bleeding into the eye required surgical intervention. Coming to my office today doing well cardiac wise denies of any chest pain tightness squeezing pressure burning chest discomfort think is there but otherwise seems to be doing well.  Past Medical History:  Diagnosis Date   Arthritis    CAD (coronary artery disease)    a. NSTEMI 10/2017 s/p multivessel PCI of the OM1, PDA, PLA ostium and mid PLA.   CKD (chronic kidney disease), stage II    GERD (gastroesophageal reflux disease)    Gout    Hypertension    Insulin dependent diabetes mellitus     Past Surgical History:  Procedure Laterality Date   CORONARY STENT INTERVENTION N/A 10/17/2017   Procedure: CORONARY STENT INTERVENTION;  Surgeon: Jettie Booze, MD;  Location: [facility];  Service: Cardiovascular;  Laterality: N/A;   FRACTURE SURGERY     right ring finger   KNEE ARTHROSCOPY WITH LATERAL MENISECTOMY Left 01/22/2013    Procedure: LEFT KNEE ARTHROSCOPY DEBRIDEMENT CHONDROPLASTY, LATERAL RELEASE AND partial medial meniscectomy;  Surgeon: Johnn Hai, MD;  [facility]: [facility];  Service: Orthopedics;  Laterality: Left;   KNEE SURGERY     "removed bone"   LEFT HEART CATH N/A 10/17/2017   Procedure: Left Heart Cath;  Surgeon: Jettie Booze, MD;  Location: [facility];  Service: Cardiovascular;  Laterality: N/A;   LEFT HEART CATH AND CORONARY ANGIOGRAPHY N/A 10/15/2017   Procedure: LEFT HEART CATH AND CORONARY ANGIOGRAPHY;  Surgeon: Leonie Man, MD;  Location: [facility];  Service: Cardiovascular;  Laterality: N/A;   SHOULDER SURGERY      Current Medications: Current Meds  Medication Sig   acetaminophen (TYLENOL) 325 MG tablet Take 2 tablets (650 mg total) by mouth every 4 (four) hours as needed for headache or mild pain.   allopurinol (ZYLOPRIM) 100 MG tablet Take 2 tablets (200 mg total) by mouth daily.   amLODipine (NORVASC) 10 MG tablet Take 1 tablet (10 mg total) by mouth daily.   aspirin 81 MG chewable tablet Chew 1 tablet (81 mg total) by mouth daily.   atorvastatin (LIPITOR) 80 MG tablet Take 1 tablet (80 mg total) by mouth daily at 6 PM.   carvedilol (COREG) 25 MG tablet Take 1 tablet (25 mg total) by mouth 2 (two) times daily with a meal.   Continuous Blood Gluc Receiver (FREESTYLE LIBRE 2 READER)  DEVI 1 each by Does not apply route every 14 (fourteen) days.   Continuous Blood Gluc Sensor (FREESTYLE LIBRE 2 SENSOR) MISC 1 each by Does not apply route every 14 (fourteen) days.   diclofenac Sodium (VOLTAREN) 1 % GEL Apply 2 g topically 4 (four) times daily as needed (leg pain).   ezetimibe (ZETIA) 10 MG tablet Take 1 tablet (10 mg total) by mouth daily.   ferrous sulfate 325 (65 FE) MG tablet Take 1 tablet (325 mg total) by mouth every morning.   furosemide (LASIX) 40 MG tablet Take 1 tablet (40 mg total) by mouth daily.   insulin detemir (LEVEMIR FLEXTOUCH) 100 UNIT/ML FlexPen  Inject 10 Units into the skin at bedtime. Start with 10 units every night. Titrate up by 2 units every 3 days if morning/fasting glucose is higher than 130 consistently.   Insulin Pen Needle (PEN NEEDLES) 32G X 4 MM MISC 1 each by Does not apply route daily.   lisinopril (ZESTRIL) 20 MG tablet Take 1 tablet (20 mg total) by mouth daily.   pantoprazole (PROTONIX) 40 MG tablet Take 1 tablet (40 mg total) by mouth 2 (two) times daily.   polyethylene glycol (MIRALAX) 17 g packet Take 17 g by mouth daily. (Patient taking differently: Take 17 g by mouth daily as needed for mild constipation.)   sitaGLIPtin (JANUVIA) 50 MG tablet Take 1 tablet (50 mg total) by mouth daily.   Vitamin D, Ergocalciferol, (DRISDOL) 1.25 MG (50000 UT) CAPS capsule Take 50,000 Units by mouth every 7 (seven) days.   [DISCONTINUED] predniSONE (DELTASONE) 5 MG tablet Take 6 pills for first day, 5 pills second day, 4 pills third day, 3 pills fourth day, 2 pills the fifth day, and 1 pill sixth day.     Allergies:   Patient has no known allergies.   Social History   Socioeconomic History   Marital status: Married    Spouse name: Not on file   Number of children: Not on file   Years of education: Not on file   Highest education level: Not on file  Occupational History   Not on file  Tobacco Use   Smoking status: Former    Packs/day: 0.50    Years: 20.00    Pack years: 10.00    Types: Cigarettes, Cigars    Quit date: 10/08/2002    Years since quitting: 19.4   Smokeless tobacco: Never  Vaping Use   Vaping Use: Never used  Substance and Sexual Activity   Alcohol use: Yes    Comment: occasional   Drug use: No   Sexual activity: Yes  Other Topics Concern   Not on file  Social History Narrative   Not on file   Social Determinants of Health   Financial Resource Strain: Not on file  Food Insecurity: Not on file  Transportation Needs: Not on file  Physical Activity: Not on file  Stress: Not on file  Social  Connections: Not on file     Family History: The patient's family history includes Cancer in his father; Diabetes in his mother. ROS:   Please see the history of present illness.    All 14 point review of systems negative except as described per history of present illness  EKGs/Labs/Other Studies Reviewed:      Recent Labs: 12/04/2021: ALT 15; BUN 32; Creatinine, Ser 2.25; Hemoglobin 10.8; Platelets 236.0; Potassium 4.1; Sodium 132; TSH 1.09  Recent Lipid Panel    Component Value Date/Time   CHOL 101  02/21/2022 0917   TRIG 179 (H) 02/21/2022 0917   HDL 30 (L) 02/21/2022 0917   CHOLHDL 3.4 02/21/2022 0917   CHOLHDL 4 12/04/2021 1539   VLDL 31.8 12/04/2021 1539   LDLCALC 41 02/21/2022 0917    Physical Exam:    VS:  BP 132/78 (BP Location: Right Arm, Patient Position: Sitting)   Pulse 81   Ht 5\' 8"  (1.727 m)   Wt 216 lb (98 kg)   SpO2 99%   BMI 32.84 kg/m     Wt Readings from Last 3 Encounters:  02/28/22 216 lb (98 kg)  01/24/22 226 lb (102.5 kg)  01/16/22 217 lb (98.4 kg)     GEN:  Well nourished, well developed in no acute distress HEENT: Normal NECK: No JVD; No carotid bruits LYMPHATICS: No lymphadenopathy CARDIAC: RRR, no murmurs, no rubs, no gallops RESPIRATORY:  Clear to auscultation without rales, wheezing or rhonchi  ABDOMEN: Soft, non-tender, non-distended MUSCULOSKELETAL:  No edema; No deformity  SKIN: Warm and dry LOWER EXTREMITIES: no swelling NEUROLOGIC:  Alert and oriented x 3 PSYCHIATRIC:  Normal affect   ASSESSMENT:    1. Coronary artery disease involving native coronary artery of native heart without angina pectoris   2. Type 2 diabetes mellitus with hyperglycemia, without long-term current use of insulin (HCC)   3. CKD (chronic kidney disease), stage II   4. Mixed hyperlipidemia    PLAN:    In order of problems listed above:  Coronary disease which is related to poorly controlled diabetes and poorly managed risk factors.  Stress test  negative denies have any new symptomatology we will continue present management. Type 2 diabetes last hemoglobin A1c is 9.7!  This is from February 27 clearly need to be better controlled I did talk to him in length about his he I told him that he need to see his primary care physician and favorably endocrinologist to help with the management of this complicated situation. Dyslipidemia I did review his K PN which show me LDL 41 HDL 30 this is from 02/21/2022 Lipitor 80 he is taking which is high intense statin which is excellent choice to continue. Chronic kidney failure.  Followed by nephrology creatinine between 2 and 2.5.  Stable   Medication Adjustments/Labs and Tests Ordered: Current medicines are reviewed at length with the patient today.  Concerns regarding medicines are outlined above.  No orders of the defined types were placed in this encounter.  Medication changes: No orders of the defined types were placed in this encounter.   Signed, Park Liter, MD, [facility] 02/28/2022 4:29 PM    [place]

## 2022-02-28 NOTE — Patient Instructions (Signed)

## 2022-03-11 NOTE — Progress Notes (Unsigned)
   Established Patient Office Visit  Subjective   Patient ID: Matthew Fisher, male    DOB: 05-28-1971  Age: 51 y.o. MRN: [id number]  No chief complaint on file.   HPI Patient presents for 36-month follow-up. (Pre-charting ***) He last saw me on 12/04/21 to establish care after previously following a community clinic.  He saw cardiology [facility], Dr. Windell Moment) on 02/28/22 for CAD, HLD. No changes to current plan as stress test was negative he denied any new symptoms at the time. Recommended continue atorvastatin 80 mg daily. He also follows with nephrology for CKD with stable Cr 2-2.5. Last appointment with them was ***.  HYPERTENSION: - Medications: *** - Compliance: *** - Checking BP at home: *** - Denies any SOB, recurrent headaches, CP, vision changes, LE edema, dizziness, palpitations, or medication side effects. - Diet: *** - Exercise: *** - Stressors:  HYPERLIPIDEMIA - medications: *** - compliance: *** - medication SEs: *** The ASCVD Risk score (Arnett DK, et al., 2019) failed to calculate for the following reasons:   The patient has a prior MI or stroke diagnosis  DIABETES: - Checking BG at home: *** - Medications: *** - Compliance: *** - Diet: *** - Exercise: *** - Eye exam: *** - Foot exam: *** - Microalbumin: *** - Denies symptoms of hypoglycemia, polyuria, polydipsia, numbness extremities, foot ulcers/trauma, wounds that are not healing, medication side effects    Vitamin D deficiency: - weekly oral replacement ***  Iron Deficiency: - taking 325 mg daily, no side effects - denies any new fatigue or signs of bleeding            {History (Optional):23778}  ROS    Objective:     There were no vitals taken for this visit. {Vitals History (Optional):23777}  Physical Exam   No results found for any visits on 03/12/22.  {Labs (Optional):23779}  The ASCVD Risk score (Arnett DK, et al., 2019) failed to calculate for the following reasons:    The patient has a prior MI or stroke diagnosis    Assessment & Plan:   Problem List Items Addressed This Visit   None   No follow-ups on file.    Clayborne Dana, NP

## 2022-03-12 ENCOUNTER — Encounter: Payer: Self-pay | Admitting: Family Medicine

## 2022-03-12 ENCOUNTER — Ambulatory Visit: Payer: Medicare Other | Admitting: Family Medicine

## 2022-03-12 ENCOUNTER — Ambulatory Visit (INDEPENDENT_AMBULATORY_CARE_PROVIDER_SITE_OTHER): Payer: Medicare Other | Admitting: Family Medicine

## 2022-03-12 VITALS — BP 130/72 | HR 84 | Ht 68.0 in | Wt 218.4 lb

## 2022-03-12 DIAGNOSIS — E1165 Type 2 diabetes mellitus with hyperglycemia: Secondary | ICD-10-CM | POA: Diagnosis not present

## 2022-03-12 DIAGNOSIS — I2510 Atherosclerotic heart disease of native coronary artery without angina pectoris: Secondary | ICD-10-CM

## 2022-03-12 DIAGNOSIS — I10 Essential (primary) hypertension: Secondary | ICD-10-CM | POA: Diagnosis not present

## 2022-03-12 DIAGNOSIS — E782 Mixed hyperlipidemia: Secondary | ICD-10-CM

## 2022-03-12 DIAGNOSIS — N182 Chronic kidney disease, stage 2 (mild): Secondary | ICD-10-CM | POA: Diagnosis not present

## 2022-03-12 MED ORDER — NITROGLYCERIN 0.4 MG SL SUBL: 0.4000 mg | SUBLINGUAL_TABLET | SUBLINGUAL | 1 refills | Status: AC | PRN

## 2022-03-12 NOTE — Assessment & Plan Note (Signed)
Continue following with nephrologist and avoiding nephrotoxic drugs. Monitor labs.

## 2022-03-12 NOTE — Patient Instructions (Addendum)
Blood pressure is at goal for age and co-morbidities.  I recommend continuing current regimen.   - BP goal <130/80 - monitor and log blood pressures at home - check around the same time each day in a relaxed setting - Limit salt to <2000 mg/day - Follow DASH eating plan (heart healthy diet) - limit alcohol to 2 standard drinks per day for men and 1 per day for women - avoid tobacco products - get at least 2 hours of regular aerobic exercise weekly Patient aware of signs/symptoms requiring further/urgent evaluation. Labs updated today.  Diabetes: Poorly controlled with last A1c > 9% Continue current medications.  - increase Levemir to 12 units nightly, if still having fasting glucose greater than 130 by the end of the week, then increase to 14 units nightly for the next 3-4 days, continue increasing by 2 units every 3-4 nights until fasting sugars are less than 130. UTD on vaccines, eye exam, foot exam On ACEi  On Statin  Discussed diet and exercise F/u in 3 months

## 2022-03-12 NOTE — Assessment & Plan Note (Signed)
Recently checked by cardiology and stable with current regimen. No changes today.

## 2022-03-12 NOTE — Assessment & Plan Note (Signed)
Blood pressure is at goal for age and co-morbidities.  I recommend continuing current regimen.   - BP goal <130/80 - monitor and log blood pressures at home - check around the same time each day in a relaxed setting - Limit salt to <2000 mg/day - Follow DASH eating plan (heart healthy diet) - limit alcohol to 2 standard drinks per day for men and 1 per day for women - avoid tobacco products - get at least 2 hours of regular aerobic exercise weekly Patient aware of signs/symptoms requiring further/urgent evaluation. Labs updated today.

## 2022-03-12 NOTE — Assessment & Plan Note (Signed)
Poorly controlled with last A1c > 9% Continue current medications.  - increase Levemir to 12 units nightly, if still having fasting glucose greater than 130 by the end of the week, then increase to 14 units nightly for the next 3-4 days, continue increasing by 2 units every 3-4 nights until fasting sugars are less than 130. UTD on vaccines, eye exam, foot exam On ACEi  On Statin  Discussed diet and exercise F/u in 3 months

## 2022-03-12 NOTE — Assessment & Plan Note (Signed)
No new concerns. Nitro refilled - has not needed to use. Following with cardiology.

## 2022-03-13 ENCOUNTER — Encounter: Payer: Self-pay | Admitting: *Deleted

## 2022-03-13 LAB — COMPREHENSIVE METABOLIC PANEL
ALT: 15 U/L (ref 0–53)
AST: 18 U/L (ref 0–37)
Albumin: 4 g/dL (ref 3.5–5.2)
Alkaline Phosphatase: 94 U/L (ref 39–117)
BUN: 32 mg/dL — ABNORMAL HIGH (ref 6–23)
CO2: 26 mEq/L (ref 19–32)
Calcium: 8.5 mg/dL (ref 8.4–10.5)
Chloride: 102 mEq/L (ref 96–112)
Creatinine, Ser: 2.64 mg/dL — ABNORMAL HIGH (ref 0.40–1.50)
GFR: 26.99 mL/min — ABNORMAL LOW (ref >60.00)
Glucose, Bld: 157 mg/dL — ABNORMAL HIGH (ref 70–99)
Potassium: 4.2 mEq/L (ref 3.5–5.1)
Sodium: 136 mEq/L (ref 135–145)
Total Bilirubin: 0.5 mg/dL (ref 0.2–1.2)
Total Protein: 6.6 g/dL (ref 6.0–8.3)

## 2022-03-13 LAB — CBC
HCT: 28.9 % — ABNORMAL LOW (ref 39.0–52.0)
Hemoglobin: 10.3 g/dL — ABNORMAL LOW (ref 13.0–17.0)
MCHC: 35.8 g/dL (ref 30.0–36.0)
MCV: 88.5 fl (ref 78.0–100.0)
Platelets: 178 10*3/uL (ref 150.0–400.0)
RBC: 3.26 Mil/uL — ABNORMAL LOW (ref 4.22–5.81)
RDW: 13.8 % (ref 11.5–15.5)
WBC: 6 10*3/uL (ref 4.0–10.5)

## 2022-03-13 LAB — HEMOGLOBIN A1C: Hgb A1c MFr Bld: 9.2 % — ABNORMAL HIGH (ref 4.6–6.5)

## 2022-03-13 LAB — TSH: TSH: 1.74 u[IU]/mL (ref 0.35–5.50)

## 2022-03-30 ENCOUNTER — Other Ambulatory Visit: Payer: Self-pay | Admitting: *Deleted

## 2022-03-30 DIAGNOSIS — E782 Mixed hyperlipidemia: Secondary | ICD-10-CM

## 2022-03-30 MED ORDER — ATORVASTATIN CALCIUM 80 MG PO TABS: 80.0000 mg | ORAL_TABLET | Freq: Every day | ORAL | 5 refills | Status: DC

## 2022-04-02 ENCOUNTER — Ambulatory Visit (INDEPENDENT_AMBULATORY_CARE_PROVIDER_SITE_OTHER): Payer: Medicare Other

## 2022-04-02 VITALS — BP 138/90 | HR 76 | Temp 98.2°F | Resp 16 | Ht 68.0 in | Wt 217.4 lb

## 2022-04-02 DIAGNOSIS — Z0000 Encounter for general adult medical examination without abnormal findings: Secondary | ICD-10-CM | POA: Diagnosis not present

## 2022-04-25 ENCOUNTER — Telehealth: Payer: Self-pay

## 2022-04-25 ENCOUNTER — Ambulatory Visit: Payer: Medicare Other | Admitting: Surgical

## 2022-04-25 DIAGNOSIS — M170 Bilateral primary osteoarthritis of knee: Secondary | ICD-10-CM | POA: Diagnosis not present

## 2022-04-25 NOTE — Telephone Encounter (Signed)
Auth needed for bilat knee gel injections  

## 2022-04-27 ENCOUNTER — Other Ambulatory Visit: Payer: Self-pay | Admitting: *Deleted

## 2022-04-27 DIAGNOSIS — I10 Essential (primary) hypertension: Secondary | ICD-10-CM

## 2022-04-27 MED ORDER — AMLODIPINE BESYLATE 10 MG PO TABS: 10.0000 mg | ORAL_TABLET | Freq: Every day | ORAL | 5 refills | Status: DC

## 2022-04-29 ENCOUNTER — Encounter: Payer: Self-pay | Admitting: Orthopedic Surgery

## 2022-04-29 NOTE — Progress Notes (Signed)
Office Visit Note   Patient: Matthew Fisher           Date of Birth: 04-22-1971           MRN: [id number] Visit Date: 04/25/2022 Requested by: Clayborne Dana, NP [address] [place],  [place] [address] PCP: Clayborne Dana, NP  Subjective: Chief Complaint  Patient presents with   Other    Follow up bilateral knee pain    HPI: Matthew Fisher is a 51 y.o. male who presents to the office complaining of bilateral knee pain.  Patient has history of bilateral knee osteoarthritis.  He is returning today for repeat consideration of total knee arthroplasty.  He does have a history of diabetes that is uncontrolled and he is working to maintain better control of his glucose.  Most recent A1c is 9.2 on 03/12/2022 which is down from 9.7.  Reports that his PCP is managing his diabetes.  He is not 100% compliant with his diabetes regimen.  Does not take any blood thinners.  His cardiologist is Dr. Bing Matter.  Does have history of gout.  He has had previous gel injections by Dr. Katrinka Blazing that provided about 3 days of relief..                ROS: All systems reviewed are negative as they relate to the chief complaint within the history of present illness.  Patient denies fevers or chills.  Assessment & Plan: Visit Diagnoses:  1. Primary osteoarthritis of both knees     Plan: Patient is a 51 year old male who presents for repeat evaluation of bilateral knee osteoarthritis.  He would like to be considered for total knee arthroplasty but with his A1c most recently of 9.2, he is not a candidate at this time.  Informed him that he needs his A1c to be below 7.8 prior to consideration of knee replacement.  He will continue to work on improving his control blood glucose.  He does report good relief for several days with previous gel injections and would like to repeat these while he is trying to pursue optimization prior to knee surgery.  This patient is diagnosed with osteoarthritis of the  knee(s).    Radiographs show evidence of joint space narrowing, osteophytes, subchondral sclerosis and/or subchondral cysts.  This patient has knee pain which interferes with functional and activities of daily living.    This patient has experienced inadequate response, adverse effects and/or intolerance with conservative treatments such as acetaminophen, NSAIDS, topical creams, physical therapy or regular exercise, knee bracing and/or weight loss.   This patient has experienced inadequate response or has a contraindication to intra articular steroid injections for at least 3 months.   This patient is not scheduled to have a total knee replacement within 6 months of starting treatment with viscosupplementation.   Follow-Up Instructions: No follow-ups on file.   Orders:  No orders of the defined types were placed in this encounter.  No orders of the defined types were placed in this encounter.     Procedures: No procedures performed   Clinical Data: No additional findings.  Objective: Vital Signs: There were no vitals taken for this visit.  Physical Exam:  Constitutional: Patient appears well-developed HEENT:  Head: Normocephalic Eyes:EOM are normal Neck: Normal range of motion Cardiovascular: Normal rate Pulmonary/chest: Effort normal Neurologic: Patient is alert Skin: Skin is warm Psychiatric: Patient has normal mood and affect  Ortho Exam: Ortho exam demonstrates left and right knees with  about 5 degrees extension and around 100 degrees of knee flexion.  Able to perform straight leg raise bilaterally.  No pain with hip range of motion.  Tenderness over the medial and lateral joint lines.  Trace effusion present in both knees.    Specialty Comments:  No specialty comments available.  Imaging: No results found.   PMFS History: Patient Active Problem List   Diagnosis Date Noted   Primary osteoarthritis of both knees 01/09/2022   Gout 12/21/2021   GERD  (gastroesophageal reflux disease) 12/21/2021   Arthritis 12/21/2021   Gastroesophageal reflux disease with esophagitis 12/04/2021   Chronic gout of knee 12/04/2021   Mixed hyperlipidemia 12/04/2021   Iron deficiency 12/04/2021   Acute gout due to renal impairment involving left knee 09/28/2021   Synovitis of right knee 09/28/2021   CHF (congestive heart failure) (HCC) 09/11/2019   Nonadherence to medical treatment    Tobacco abuse disorder    CAD (coronary artery disease) 10/18/2017   CKD (chronic kidney disease), 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 NSTEMI 10/2017 s/p multivessel PCI of the OM1, PDA, PLA ostium and mid PLA.   CKD (chronic kidney disease), stage II    GERD (gastroesophageal reflux disease)    Gout    Hypertension    Insulin dependent diabetes mellitus     Family History  Problem Relation Age of Onset   Diabetes Mother    Cancer Father     Past Surgical History:  Procedure Laterality Date   CORONARY STENT INTERVENTION N/A 10/17/2017   Procedure: CORONARY STENT INTERVENTION;  Surgeon: Corky Crafts, MD;  Location: [facility] INVASIVE CV LAB;  Service: Cardiovascular;  Laterality: N/A;   EYE SURGERY     FRACTURE SURGERY     right ring finger   KNEE ARTHROSCOPY WITH LATERAL MENISECTOMY Left 01/22/2013   Procedure: LEFT KNEE ARTHROSCOPY DEBRIDEMENT CHONDROPLASTY, LATERAL RELEASE AND partial medial meniscectomy;  Surgeon: Javier Docker, MD;  Location: [facility] ORS;  Service: Orthopedics;  Laterality: Left;   KNEE SURGERY     "removed bone"   LEFT HEART CATH N/A 10/17/2017   Procedure: Left Heart Cath;   Surgeon: Corky Crafts, MD;  Location[facility] INVASIVE CV LAB;  Service: Cardiovascular;  Laterality: N/A;   LEFT HEART CATH AND CORONARY ANGIOGRAPHY N/A 10/15/2017   Procedure: LEFT HEART CATH AND CORONARY ANGIOGRAPHY;  Surgeon: Marykay Lex, MD;  Location: [facility] INVASIVE CV LAB;  Service: Cardiovascular;  Laterality: N/A;   SHOULDER SURGERY     Social History   Occupational History   Not on file  Tobacco Use   Smoking status: Former    Packs/day: 0.50    Years: 20.00    Total pack years: 10.00    Types: Cigarettes, Cigars    Quit date: 10/08/2002    Years since quitting: 19.5   Smokeless tobacco: Never  Vaping Use   Vaping Use: Never used  Substance and Sexual Activity   Alcohol use: Yes    Comment: occasional   Drug use: No   Sexual activity: Yes

## 2022-05-03 NOTE — Telephone Encounter (Signed)
Approved for Durolane-bilateral knee Buy and Bill Covered @ 80% $20 copay No precert Required

## 2022-05-03 NOTE — Telephone Encounter (Signed)
Called and advised , scheduled appt for gel injection

## 2022-05-16 ENCOUNTER — Telehealth: Payer: Self-pay | Admitting: Family Medicine

## 2022-05-16 DIAGNOSIS — E1165 Type 2 diabetes mellitus with hyperglycemia: Secondary | ICD-10-CM

## 2022-05-16 NOTE — Telephone Encounter (Signed)
Patient's wife called to get referral for Endocrinologist due to unstable blood sugar levels. The patient's orthopedic doctor needs a stable blood sugar before they can do surgery. Please send referral and call wife, Alcario Drought, back at [phone].

## 2022-05-17 NOTE — Telephone Encounter (Signed)
Called and spoke with pt's wife and advised her of referral & of Taylor's suggestions to bring in a log of a few weeks of fasting glucose numbers.  Pt does not log his numbers but wife will have him start now.  Advised her to make him an appointment here in the office with someone in about 2 weeks.

## 2022-05-23 ENCOUNTER — Ambulatory Visit: Payer: Medicare Other | Admitting: Orthopedic Surgery

## 2022-05-23 DIAGNOSIS — Z794 Long term (current) use of insulin: Secondary | ICD-10-CM | POA: Diagnosis not present

## 2022-05-23 DIAGNOSIS — E113513 Type 2 diabetes mellitus with proliferative diabetic retinopathy with macular edema, bilateral: Secondary | ICD-10-CM | POA: Diagnosis not present

## 2022-05-23 DIAGNOSIS — H35372 Puckering of macula, left eye: Secondary | ICD-10-CM | POA: Diagnosis not present

## 2022-05-23 DIAGNOSIS — H4312 Vitreous hemorrhage, left eye: Secondary | ICD-10-CM | POA: Diagnosis not present

## 2022-05-23 DIAGNOSIS — H3589 Other specified retinal disorders: Secondary | ICD-10-CM | POA: Diagnosis not present

## 2022-05-23 LAB — HM DIABETES EYE EXAM

## 2022-06-08 ENCOUNTER — Other Ambulatory Visit: Payer: Self-pay

## 2022-06-08 ENCOUNTER — Emergency Department (HOSPITAL_COMMUNITY)
Admission: EM | Admit: 2022-06-08 | Discharge: 2022-06-08 | Disposition: A | Discharge status: Home / Self Care | Payer: Medicare Other | Source: Home / Self Care | Attending: Emergency Medicine | Admitting: Emergency Medicine

## 2022-06-08 ENCOUNTER — Emergency Department (HOSPITAL_BASED_OUTPATIENT_CLINIC_OR_DEPARTMENT_OTHER)
Admission: EM | Admit: 2022-06-08 | Discharge: 2022-06-08 | Disposition: A | Discharge status: Home / Self Care | Payer: Medicare Other | Attending: Emergency Medicine | Admitting: Emergency Medicine

## 2022-06-08 ENCOUNTER — Encounter (HOSPITAL_BASED_OUTPATIENT_CLINIC_OR_DEPARTMENT_OTHER): Payer: Self-pay | Admitting: Emergency Medicine

## 2022-06-08 ENCOUNTER — Emergency Department (HOSPITAL_BASED_OUTPATIENT_CLINIC_OR_DEPARTMENT_OTHER): Payer: Medicare Other

## 2022-06-08 DIAGNOSIS — R339 Retention of urine, unspecified: Secondary | ICD-10-CM | POA: Insufficient documentation

## 2022-06-08 DIAGNOSIS — K610 Anal abscess: Secondary | ICD-10-CM | POA: Insufficient documentation

## 2022-06-08 DIAGNOSIS — R140 Abdominal distension (gaseous): Secondary | ICD-10-CM | POA: Insufficient documentation

## 2022-06-08 DIAGNOSIS — Z7982 Long term (current) use of aspirin: Secondary | ICD-10-CM | POA: Insufficient documentation

## 2022-06-08 DIAGNOSIS — E119 Type 2 diabetes mellitus without complications: Secondary | ICD-10-CM | POA: Insufficient documentation

## 2022-06-08 DIAGNOSIS — K6289 Other specified diseases of anus and rectum: Secondary | ICD-10-CM | POA: Insufficient documentation

## 2022-06-08 DIAGNOSIS — R338 Other retention of urine: Secondary | ICD-10-CM

## 2022-06-08 DIAGNOSIS — Z794 Long term (current) use of insulin: Secondary | ICD-10-CM | POA: Insufficient documentation

## 2022-06-08 DIAGNOSIS — K6389 Other specified diseases of intestine: Secondary | ICD-10-CM | POA: Diagnosis not present

## 2022-06-08 DIAGNOSIS — Z7984 Long term (current) use of oral hypoglycemic drugs: Secondary | ICD-10-CM | POA: Diagnosis not present

## 2022-06-08 LAB — BASIC METABOLIC PANEL
Anion gap: 9 (ref 5–15)
BUN: 25 mg/dL — ABNORMAL HIGH (ref 6–20)
CO2: 22 mmol/L (ref 22–32)
Calcium: 8.7 mg/dL — ABNORMAL LOW (ref 8.9–10.3)
Chloride: 103 mmol/L (ref 98–111)
Creatinine, Ser: 2.32 mg/dL — ABNORMAL HIGH (ref 0.61–1.24)
GFR, Estimated: 33 mL/min — ABNORMAL LOW (ref >60)
Glucose, Bld: 195 mg/dL — ABNORMAL HIGH (ref 70–99)
Potassium: 4.1 mmol/L (ref 3.5–5.1)
Sodium: 134 mmol/L — ABNORMAL LOW (ref 135–145)

## 2022-06-08 LAB — COMPREHENSIVE METABOLIC PANEL
ALT: 15 U/L (ref 0–44)
AST: 16 U/L (ref 15–41)
Albumin: 3.7 g/dL (ref 3.5–5.0)
Alkaline Phosphatase: 110 U/L (ref 38–126)
Anion gap: 8 (ref 5–15)
BUN: 26 mg/dL — ABNORMAL HIGH (ref 6–20)
CO2: 23 mmol/L (ref 22–32)
Calcium: 9 mg/dL (ref 8.9–10.3)
Chloride: 101 mmol/L (ref 98–111)
Creatinine, Ser: 2.67 mg/dL — ABNORMAL HIGH (ref 0.61–1.24)
GFR, Estimated: 28 mL/min — ABNORMAL LOW (ref >60)
Glucose, Bld: 220 mg/dL — ABNORMAL HIGH (ref 70–99)
Potassium: 4.1 mmol/L (ref 3.5–5.1)
Sodium: 132 mmol/L — ABNORMAL LOW (ref 135–145)
Total Bilirubin: 1 mg/dL (ref 0.3–1.2)
Total Protein: 7 g/dL (ref 6.5–8.1)

## 2022-06-08 LAB — CBC WITH DIFFERENTIAL/PLATELET
Abs Immature Granulocytes: 0.04 10*3/uL (ref 0.00–0.07)
Abs Immature Granulocytes: 0.04 10*3/uL (ref 0.00–0.07)
Basophils Absolute: 0 10*3/uL (ref 0.0–0.1)
Basophils Absolute: 0 10*3/uL (ref 0.0–0.1)
Basophils Relative: 0 %
Basophils Relative: 0 %
Eosinophils Absolute: 0.1 10*3/uL (ref 0.0–0.5)
Eosinophils Absolute: 0 10*3/uL (ref 0.0–0.5)
Eosinophils Relative: 1 %
Eosinophils Relative: 0 %
HCT: 26.2 % — ABNORMAL LOW (ref 39.0–52.0)
HCT: 29.6 % — ABNORMAL LOW (ref 39.0–52.0)
Hemoglobin: 9.7 g/dL — ABNORMAL LOW (ref 13.0–17.0)
Hemoglobin: 10.9 g/dL — ABNORMAL LOW (ref 13.0–17.0)
Immature Granulocytes: 0 %
Immature Granulocytes: 0 %
Lymphocytes Relative: 18 %
Lymphocytes Relative: 16 %
Lymphs Abs: 2.2 10*3/uL (ref 0.7–4.0)
Lymphs Abs: 1.8 10*3/uL (ref 0.7–4.0)
MCH: 32.2 pg (ref 26.0–34.0)
MCH: 31.9 pg (ref 26.0–34.0)
MCHC: 37 g/dL — ABNORMAL HIGH (ref 30.0–36.0)
MCHC: 36.8 g/dL — ABNORMAL HIGH (ref 30.0–36.0)
MCV: 87 fL (ref 80.0–100.0)
MCV: 86.5 fL (ref 80.0–100.0)
Monocytes Absolute: 1 10*3/uL (ref 0.1–1.0)
Monocytes Absolute: 0.9 10*3/uL (ref 0.1–1.0)
Monocytes Relative: 8 %
Monocytes Relative: 8 %
Neutro Abs: 8.9 10*3/uL — ABNORMAL HIGH (ref 1.7–7.7)
Neutro Abs: 8.4 10*3/uL — ABNORMAL HIGH (ref 1.7–7.7)
Neutrophils Relative %: 73 %
Neutrophils Relative %: 76 %
Platelets: 208 10*3/uL (ref 150–400)
Platelets: 214 10*3/uL (ref 150–400)
RBC: 3.01 MIL/uL — ABNORMAL LOW (ref 4.22–5.81)
RBC: 3.42 MIL/uL — ABNORMAL LOW (ref 4.22–5.81)
RDW: 12.8 % (ref 11.5–15.5)
RDW: 12.7 % (ref 11.5–15.5)
WBC: 12.1 10*3/uL — ABNORMAL HIGH (ref 4.0–10.5)
WBC: 11.1 10*3/uL — ABNORMAL HIGH (ref 4.0–10.5)
nRBC: 0 % (ref 0.0–0.2)
nRBC: 0 % (ref 0.0–0.2)

## 2022-06-08 MED ORDER — CIPROFLOXACIN HCL 500 MG PO TABS: 500.0000 mg | ORAL_TABLET | Freq: Two times a day (BID) | ORAL | 0 refills | Status: DC

## 2022-06-08 MED ORDER — FENTANYL CITRATE PF 50 MCG/ML IJ SOSY
100.0000 ug | PREFILLED_SYRINGE | Freq: Once | INTRAMUSCULAR | Status: AC
  Administered 2022-06-08: 100 ug via INTRAVENOUS
  Filled 2022-06-08: qty 2

## 2022-06-08 MED ORDER — OXYCODONE HCL 5 MG PO TABS: 2.5000 mg | ORAL_TABLET | ORAL | 0 refills | Status: DC | PRN

## 2022-06-08 MED ORDER — AMOXICILLIN-POT CLAVULANATE 875-125 MG PO TABS: 1.0000 | ORAL_TABLET | Freq: Two times a day (BID) | ORAL | 0 refills | Status: DC

## 2022-06-08 MED ORDER — MORPHINE SULFATE (PF) 4 MG/ML IV SOLN
4.0000 mg | Freq: Once | INTRAVENOUS | Status: AC
  Administered 2022-06-08: 4 mg via INTRAVENOUS
  Filled 2022-06-08: qty 1

## 2022-06-08 MED ORDER — SODIUM CHLORIDE 0.9 % IV SOLN
3.0000 g | Freq: Once | INTRAVENOUS | Status: AC
  Administered 2022-06-08: 3 g via INTRAVENOUS

## 2022-06-08 NOTE — ED Triage Notes (Signed)
Pt reports potential rectal abscess. Seen for same at [facility] today. Denies n/v, fever or chills. Endorses abdominal pain and back pain in addition to rectal pain.

## 2022-06-08 NOTE — ED Provider Triage Note (Signed)
Emergency Medicine Provider Triage Evaluation Note  Koree Schopf , a 51 y.o. male  was evaluated in triage.  Pt complains of perirectal abscess, seen here this morning, continues to endorse pain. CT was obtained without contrast due to creatine function at 2 but within his baseline. Prescribed antibiotics but has not taken them. Has not had bowel movement in 3 days, also endorsing pain urinating.   Review of Systems  Positive: Rectal pain Negative: Fever, diarrhea  Physical Exam  There were no vitals taken for this visit. Gen:   Awake, no distress   Resp:  Normal effort  MSK:   Moves extremities without difficulty  Other:    Medical Decision Making  Medically screening exam initiated at 5:35 PM.  Appropriate orders placed.  Terik Haughey was informed that the remainder of the evaluation will be completed by another provider, this initial triage assessment does not replace that evaluation, and the importance of remaining in the ED until their evaluation is complete.     Claude Manges, PA-C 06/08/22 1741

## 2022-06-08 NOTE — ED Provider Notes (Signed)
[facility] EMERGENCY DEPARTMENT Provider Note   CSN: [id number] Arrival date & time: 06/08/22  0534     History  Chief Complaint  Patient presents with   Abscess    Matthew Fisher is a 51 y.o. male.  The history is provided by the patient.  Abscess Location:  Pelvis Pelvic abscess location:  Perianal Abscess quality: painful   Duration:  5 days Progression:  Worsening Pain details:    Severity:  Severe   Timing:  Constant   Progression:  Worsening Chronicity:  New Context: diabetes   Associated symptoms: no fever    Patient presents for possible abscess around his rectum.  He reports this started by 5 days ago and is worsening.  He thought it was a hemorrhoid and he used a suppository without relief.  There is been no drainage.  No fevers or vomiting.  He has had some abdominal discomfort.  He reports it hurts to defecate and urinate. No recent rectal trauma, he has not inserted anything in his rectum    Home Medications Prior to Admission medications   Medication Sig Start Date End Date Taking? Authorizing Provider  acetaminophen (TYLENOL) 325 MG tablet Take 2 tablets (650 mg total) by mouth every 4 (four) hours as needed for headache or mild pain. 09/12/19   Swayze, Ava, DO  allopurinol (ZYLOPRIM) 100 MG tablet Take 2 tablets (200 mg total) by mouth daily. 12/04/21   Clayborne Dana, NP  amLODipine (NORVASC) 10 MG tablet Take 1 tablet (10 mg total) by mouth daily. 04/27/22   Clayborne Dana, NP  aspirin 81 MG chewable tablet Chew 1 tablet (81 mg total) by mouth daily. 10/20/17   Dunn, Raymon Mutton, PA-C  atorvastatin (LIPITOR) 80 MG tablet Take 1 tablet (80 mg total) by mouth daily at 6 PM. 03/30/22   Clayborne Dana, NP  carvedilol (COREG) 25 MG tablet Take 1 tablet (25 mg total) by mouth 2 (two) times daily with a meal. 12/04/21   Clayborne Dana, NP  Continuous Blood Gluc Receiver (FREESTYLE LIBRE 2 READER) DEVI 1 each by Does not apply route every 14 (fourteen) days.  12/07/21   Clayborne Dana, NP  Continuous Blood Gluc Sensor (FREESTYLE LIBRE 2 SENSOR) MISC 1 each by Does not apply route every 14 (fourteen) days. 12/07/21   Clayborne Dana, NP  diclofenac Sodium (VOLTAREN) 1 % GEL Apply 2 g topically 4 (four) times daily as needed (leg pain). 11/11/20   Long, Arlyss Repress, MD  ezetimibe (ZETIA) 10 MG tablet Take 1 tablet (10 mg total) by mouth daily. 12/25/21   Georgeanna Lea, MD  ferrous sulfate 325 (65 FE) MG tablet Take 1 tablet (325 mg total) by mouth every morning. 12/04/21   Clayborne Dana, NP  furosemide (LASIX) 40 MG tablet Take 1 tablet (40 mg total) by mouth daily. 12/04/21   Clayborne Dana, NP  insulin detemir (LEVEMIR FLEXTOUCH) 100 UNIT/ML FlexPen Inject 10 Units into the skin at bedtime. Start with 10 units every night. Titrate up by 2 units every 3 days if morning/fasting glucose is higher than 130 consistently. 12/07/21   Clayborne Dana, NP  Insulin Pen Needle (PEN NEEDLES) 32G X 4 MM MISC 1 each by Does not apply route daily. 12/07/21   Clayborne Dana, NP  lisinopril (ZESTRIL) 20 MG tablet Take 1 tablet (20 mg total) by mouth daily. 12/04/21   Clayborne Dana, NP  nitroGLYCERIN (NITROSTAT) 0.4 MG SL tablet  Place 1 tablet (0.4 mg total) under the tongue every 5 (five) minutes as needed for chest pain. 03/12/22 08/02/24  Clayborne Dana, NP  pantoprazole (PROTONIX) 40 MG tablet Take 1 tablet (40 mg total) by mouth 2 (two) times daily. 12/08/21   Clayborne Dana, NP  polyethylene glycol (MIRALAX) 17 g packet Take 17 g by mouth daily. Patient taking differently: Take 17 g by mouth daily as needed for mild constipation. 07/04/19   Palumbo, April, MD  sitaGLIPtin (JANUVIA) 50 MG tablet Take 1 tablet (50 mg total) by mouth daily. 12/04/21   Clayborne Dana, NP  Vitamin D, Ergocalciferol, (DRISDOL) 1.25 MG (50000 UT) CAPS capsule Take 50,000 Units by mouth every 7 (seven) days. 11/27/18   [provider]      Allergies    Patient has no known allergies.    Review of  Systems   Review of Systems  Constitutional:  Negative for fever.  Gastrointestinal:  Positive for abdominal pain.    Physical Exam Updated Vital Signs BP (!) 160/72 (BP Location: Left Arm)   Pulse 86   Temp 98.5 F (36.9 C) (Oral)   Resp 20   Ht 1.727 m (5\' 8" )   Wt 97.5 kg   SpO2 100%   BMI 32.69 kg/m  Physical Exam CONSTITUTIONAL: Well developed/well nourished, uncomfortable appearing lying on his side HEAD: Normocephalic/atraumatic EYES: EOMI/PERRL ENMT: Mask in place NECK: supple no meningeal signs LUNGS: Lungs are clear to auscultation bilaterally, no apparent distress ABDOMEN: soft, nontender GU: Normal external genitalia, no obvious hernia, no testicular tenderness, no scrotal tenderness/erythema No tenderness/erythema to the perineum. There is an area of fluctuance and tenderness in the perirectal region at approximately 12:00, there is no drainage.  Patient has exquisite tenderness on digital rectal exam.  There is no obvious fistula, no obvious hemorrhoid or mass NEURO: Pt is awake/alert/appropriate, moves all extremitiesx4.  No facial droop.   EXTREMITIES: full ROM SKIN: warm, color normal PSYCH: anxious  ED Results / Procedures / Treatments   Labs (all labs ordered are listed, but only abnormal results are displayed) Labs Reviewed  CBC WITH DIFFERENTIAL/PLATELET - Abnormal; Notable for the following components:      Result Value   WBC 12.1 (*)    RBC 3.01 (*)    Hemoglobin 9.7 (*)    HCT 26.2 (*)    MCHC 37.0 (*)    Neutro Abs 8.9 (*)    All other components within normal limits  BASIC METABOLIC PANEL - Abnormal; Notable for the following components:   Sodium 134 (*)    Glucose, Bld 195 (*)    BUN 25 (*)    Creatinine, Ser 2.32 (*)    Calcium 8.7 (*)    GFR, Estimated 33 (*)    All other components within normal limits    EKG None  Radiology No results found.  Procedures Procedures    Medications Ordered in ED Medications   Ampicillin-Sulbactam (UNASYN) 3 g in sodium chloride 0.9 % 100 mL IVPB (has no administration in time range)  fentaNYL (SUBLIMAZE) injection 100 mcg (100 mcg Intravenous Given 06/08/22 0609)    ED Course/ Medical Decision Making/ A&P Clinical Course as of 06/08/22 0701  Fri Jun 08, 2022  0630 WBC(!): 12.1 Leukocytosis noted [DW]  0653 Glucose(!): 195 Hyperglycemia [DW]  0653 Creatinine(!): 2.32 Chronic renal failure [DW]  0655 Will need to undergo CT imaging to identify any abscess, but without contrast as patient has chronic renal insufficiency [DW]  3790 Signed out to Dr. Deretha Emory at shift change to f/u on CT imaging [DW]    Clinical Course User Index [DW] Zadie Rhine, MD                           Medical Decision Making Amount and/or Complexity of Data Reviewed Labs: ordered. Decision-making details documented in ED Course. Radiology: ordered.  Risk Prescription drug management.   This patient presents to the ED for concern of rectal pain, this involves an extensive number of treatment options, and is a complaint that carries with it a high risk of complications and morbidity.  The differential diagnosis includes but is not limited to perirectal abscess, external hemorrhoid, thrombosed hemorrhoid, rectal mass, Fournier's gangrene  Comorbidities that complicate the patient evaluation: Patient's presentation is complicated by their history of diabetes, heart disease, hypertension  Additional history obtained: Records reviewed Primary Care Documents  Lab Tests: I Ordered, and personally interpreted labs.  The pertinent results include:  leukocytosis, chronic renal insufficiency, hyperglycemia  Imaging Studies ordered: I ordered imaging studies including CT scan pelvis    Medicines ordered and prescription drug management: I ordered medication including fentanyl for pain Reevaluation of the patient after these medicines showed that the patient     improved  Antibiotics have been ordered for presumed abscess  Critical Interventions:  IV antibiotics   Reevaluation: After the interventions noted above, I reevaluated the patient and found that they have :improved  Complexity of problems addressed: Patient's presentation is most consistent with  acute presentation with potential threat to life or bodily function            Final Clinical Impression(s) / ED Diagnoses Final diagnoses:  Rectal pain    Rx / DC Orders ED Discharge Orders     None         Zadie Rhine, MD 06/08/22 [phone]

## 2022-06-08 NOTE — ED Provider Notes (Signed)
[facility] EMERGENCY DEPARTMENT Provider Note   CSN: [id number] Arrival date & time: 06/08/22  1727     History  No chief complaint on file.   Matthew Fisher is a 51 y.o. male who presents emergency department with chief complaint of urinary retention.  Patient was seen earlier today for with complaint of rectal pain.  He had a CT scan that showed rectal inflammation and perirectal inflammation without overt abscess.  He has a history of perirectal abscess about 15 years ago that was similar.  He is still complaining of significant pain in his rectum.  He has not been able to pass urine for the past 6 or 7 hours and complains of urinary distention and discomfort.  He denies fevers or chills.  HPI     Home Medications Prior to Admission medications   Medication Sig Start Date End Date Taking? Authorizing Provider  acetaminophen (TYLENOL) 325 MG tablet Take 2 tablets (650 mg total) by mouth every 4 (four) hours as needed for headache or mild pain. 09/12/19   Swayze, Ava, DO  allopurinol (ZYLOPRIM) 100 MG tablet Take 2 tablets (200 mg total) by mouth daily. 12/04/21   Clayborne Dana, NP  amLODipine (NORVASC) 10 MG tablet Take 1 tablet (10 mg total) by mouth daily. 04/27/22   Clayborne Dana, NP  aspirin 81 MG chewable tablet Chew 1 tablet (81 mg total) by mouth daily. 10/20/17   Dunn, Raymon Mutton, PA-C  atorvastatin (LIPITOR) 80 MG tablet Take 1 tablet (80 mg total) by mouth daily at 6 PM. 03/30/22   Clayborne Dana, NP  carvedilol (COREG) 25 MG tablet Take 1 tablet (25 mg total) by mouth 2 (two) times daily with a meal. 12/04/21   Clayborne Dana, NP  ciprofloxacin (CIPRO) 500 MG tablet Take 1 tablet (500 mg total) by mouth 2 (two) times daily. 06/08/22   Vanetta Mulders, MD  Continuous Blood Gluc Receiver (FREESTYLE LIBRE 2 READER) DEVI 1 each by Does not apply route every 14 (fourteen) days. 12/07/21   Clayborne Dana, NP  Continuous Blood Gluc Sensor (FREESTYLE LIBRE 2 SENSOR) MISC 1  each by Does not apply route every 14 (fourteen) days. 12/07/21   Clayborne Dana, NP  diclofenac Sodium (VOLTAREN) 1 % GEL Apply 2 g topically 4 (four) times daily as needed (leg pain). 11/11/20   Long, Arlyss Repress, MD  ezetimibe (ZETIA) 10 MG tablet Take 1 tablet (10 mg total) by mouth daily. 12/25/21   Georgeanna Lea, MD  ferrous sulfate 325 (65 FE) MG tablet Take 1 tablet (325 mg total) by mouth every morning. 12/04/21   Clayborne Dana, NP  furosemide (LASIX) 40 MG tablet Take 1 tablet (40 mg total) by mouth daily. 12/04/21   Clayborne Dana, NP  insulin detemir (LEVEMIR FLEXTOUCH) 100 UNIT/ML FlexPen Inject 10 Units into the skin at bedtime. Start with 10 units every night. Titrate up by 2 units every 3 days if morning/fasting glucose is higher than 130 consistently. 12/07/21   Clayborne Dana, NP  Insulin Pen Needle (PEN NEEDLES) 32G X 4 MM MISC 1 each by Does not apply route daily. 12/07/21   Clayborne Dana, NP  lisinopril (ZESTRIL) 20 MG tablet Take 1 tablet (20 mg total) by mouth daily. 12/04/21   Clayborne Dana, NP  nitroGLYCERIN (NITROSTAT) 0.4 MG SL tablet Place 1 tablet (0.4 mg total) under the tongue every 5 (five) minutes as needed for chest pain. 03/12/22  08/02/24  Terrilyn Saver, NP  pantoprazole (PROTONIX) 40 MG tablet Take 1 tablet (40 mg total) by mouth 2 (two) times daily. 12/08/21   Terrilyn Saver, NP  polyethylene glycol (MIRALAX) 17 g packet Take 17 g by mouth daily. Patient taking differently: Take 17 g by mouth daily as needed for mild constipation. 07/04/19   Palumbo, April, MD  sitaGLIPtin (JANUVIA) 50 MG tablet Take 1 tablet (50 mg total) by mouth daily. 12/04/21   Terrilyn Saver, NP  Vitamin D, Ergocalciferol, (DRISDOL) 1.25 MG (50000 UT) CAPS capsule Take 50,000 Units by mouth every 7 (seven) days. 11/27/18   [provider]      Allergies    Patient has no known allergies.    Review of Systems   Review of Systems  Physical Exam Updated Vital Signs BP (!) 162/83   Pulse  88   Temp 98.6 F (37 C) (Oral)   Resp 16   SpO2 100%  Physical Exam Vitals and nursing note reviewed.  Constitutional:      General: He is not in acute distress.    Appearance: He is well-developed. He is not diaphoretic.  HENT:     Head: Normocephalic and atraumatic.  Eyes:     General: No scleral icterus.    Conjunctiva/sclera: Conjunctivae normal.  Cardiovascular:     Rate and Rhythm: Normal rate and regular rhythm.     Heart sounds: Normal heart sounds.  Pulmonary:     Effort: Pulmonary effort is normal. No respiratory distress.     Breath sounds: Normal breath sounds.  Abdominal:     General: There is distension.     Palpations: Abdomen is soft.     Tenderness: There is no abdominal tenderness.     Comments: Lower abdomen with distention and firmness consistent with fullness of the bladder.  Genitourinary:    Comments: No obvious erythema, surrounding tenderness in the perirectal region, declined internal exam.  Exam chaperoned by Shon Hale Musculoskeletal:     Cervical back: Normal range of motion and neck supple.  Skin:    General: Skin is warm and dry.  Neurological:     Mental Status: He is alert.  Psychiatric:        Behavior: Behavior normal.     ED Results / Procedures / Treatments   Labs (all labs ordered are listed, but only abnormal results are displayed) Labs Reviewed  CBC WITH DIFFERENTIAL/PLATELET - Abnormal; Notable for the following components:      Result Value   WBC 11.1 (*)    RBC 3.42 (*)    Hemoglobin 10.9 (*)    HCT 29.6 (*)    MCHC 36.8 (*)    Neutro Abs 8.4 (*)    All other components within normal limits  COMPREHENSIVE METABOLIC PANEL - Abnormal; Notable for the following components:   Sodium 132 (*)    Glucose, Bld 220 (*)    BUN 26 (*)    Creatinine, Ser 2.67 (*)    GFR, Estimated 28 (*)    All other components within normal limits    EKG None  Radiology CT PELVIS WO CONTRAST  Result Date: 06/08/2022 CLINICAL DATA:   51 year old male with anal rectal pain for 5 days. Query abscess. EXAM: CT PELVIS WITHOUT CONTRAST TECHNIQUE: Multidetector CT imaging of the pelvis was performed following the standard protocol without intravenous contrast. RADIATION DOSE REDUCTION: This exam was performed according to the departmental dose-optimization program which includes automated exposure control,  adjustment of the mA and/or kV according to patient size and/or use of iterative reconstruction technique. COMPARISON:  None Available. FINDINGS: Urinary Tract: Kidneys not included. Visible ureters appear decompressed and normal. Unremarkable bladder. Bowel: There is presacral stranding. But no inflammation of the visible descending or sigmoid colon. There is circumferential wall thickening of the lower rectum approaching the anal verge (coronal image 36). And there is asymmetric perirectal subcutaneous soft tissue stranding greater on the left series 2, image 49. Mild overlying skin thickening. No soft tissue gas. No fluid collection is evident in the absence of contrast. Normal appendix visible on series 2, image 15. Negative visible distal small bowel and proximal right colon. Vascular/Lymphatic: Vascular patency is not evaluated in the absence of IV contrast. Mild to moderate pelvic vascular calcifications. No calcified atherosclerosis or lymphadenopathy identified in the pelvis. Reproductive:  Negative.  No generalized perineum inflammation. Other:  No pelvic free fluid. Musculoskeletal: No acute osseous abnormality identified. IMPRESSION: Distal rectum wall thickening, and mild perianal inflammation is evident, but no abscess can be identified on this noncontrast exam. No soft tissue gas. Electronically Signed   By: Odessa Fleming M.D.   On: 06/08/2022 07:51    Procedures Procedures    Medications Ordered in ED Medications - No data to display  ED Course/ Medical Decision Making/ A&P                           Medical Decision  Making Risk Prescription drug management.   51 year old male here with acute urinary retention and known perirectal infection.  Patient has not yet started taking his outpatient antibiotics.  Patient had over 300 mL of urine in his bladder.  We placed a catheter and leg bag.  Pain is significantly improved.  Patient will be discharged home with catheter in place and leg bag.  I have ordered pain medication and Augmentin to 24-hour pharmacy in [place] where the patient lives.  I have also messaged to Dr. Cardell Peach who is on-call for urology to have the patient come in for a voiding trial early in the coming week.  Patient understands discharge instructions and return precautions.  I have discussed supportive care at home for his rectal pain.  PDMP reviewed during this encounter.         Final Clinical Impression(s) / ED Diagnoses Final diagnoses:  None    Rx / DC Orders ED Discharge Orders     None         Arthor Captain, PA-C 06/08/22 2358    Franne Forts, DO 06/15/22 0007

## 2022-06-08 NOTE — ED Provider Notes (Signed)
CT scan without direct evidence of rectal or perirectal abscess.  Shows evidence of inflammation.  Patient will need close follow-up.   Vanetta Mulders, MD 06/08/22 [phone]

## 2022-06-08 NOTE — Discharge Instructions (Addendum)
CT showed no evidence of abscess.  There is inflammation in the area.  Recommend follow-up with your [facility] provider and your primary care provider.  Take the antibiotic Cipro as directed.  Also would recommend MiraLAX to keep stools soft and to retain water into the rectal area.  Most likely having difficulties with bowel movement because of the inflammation in the rectal area.

## 2022-06-08 NOTE — ED Notes (Signed)
Pt verbalized understanding of d/c instructions, meds, and followup care. Denies questions. VSS, no distress noted. Steady gait to exit with all belongings. Extensive education on foley care- printed material also provided. Leg bag applied.

## 2022-06-08 NOTE — Discharge Instructions (Signed)
Get help right away if: You have chills or a fever. You have blood in your urine. You have a catheter and the following happens: Your catheter stops draining urine. Your catheter falls out.

## 2022-06-08 NOTE — ED Triage Notes (Signed)
Pt states he has a cyst or an abscess around his anal area  Pt states it started on Sunday

## 2022-06-12 ENCOUNTER — Encounter: Payer: Self-pay | Admitting: Internal Medicine

## 2022-06-12 ENCOUNTER — Encounter: Payer: Self-pay | Admitting: Family Medicine

## 2022-06-12 ENCOUNTER — Ambulatory Visit (INDEPENDENT_AMBULATORY_CARE_PROVIDER_SITE_OTHER): Payer: Medicare Other | Admitting: Internal Medicine

## 2022-06-12 VITALS — BP 136/66 | HR 87 | Temp 98.4°F | Resp 16 | Ht 68.0 in | Wt 210.2 lb

## 2022-06-12 DIAGNOSIS — R339 Retention of urine, unspecified: Secondary | ICD-10-CM

## 2022-06-12 DIAGNOSIS — K611 Rectal abscess: Secondary | ICD-10-CM | POA: Diagnosis not present

## 2022-06-12 MED ORDER — TAMSULOSIN HCL 0.4 MG PO CAPS: 0.4000 mg | ORAL_CAPSULE | Freq: Every day | ORAL | 1 refills | Status: DC

## 2022-06-12 NOTE — Patient Instructions (Addendum)
Drink plenty fluids to avoid constipation Take MiraLAX 17 g every day until you have a bowel movement. Finish the antibiotics Tylenol for pain, try to avoid painkillers   Come back in 1 month, sooner if you feel the rectal abscess is coming back.  Start taking Flomax 0.4 mg 1 tablet daily for few weeks  Reach out to urology: [phone]

## 2022-06-12 NOTE — Progress Notes (Unsigned)
Subjective:    Patient ID: Matthew Fisher, male    DOB: 12/09/1970, 51 y.o.   MRN: [id number]  DOS:  06/12/2022 Type of visit - description: ER follow-up  Went to the ER 06/08/2022, twice: At the initial visit, he was diagnosed with a perirectal abscess,Creatinine was 2.3 at baseline, white blood cells were elevated, hemoglobin 9.7, slightly lower than usual.  CT pelvis did not show an abscess. Was given Unasyn IV and prescribed Cipro.  He also received fentanyl  Shortly after went to the ER again w/ urinary retention.  Creatinine was 2.6.  CBC okay.  Got morphine, a catheter was placed with a leg bag and was discharged home. ABX changed to Augmentin.  Since he left the ER is feeling okay. His main concern is discomfort from the Foley catheter. No fever chills No nausea or vomiting. No blood in the stools. He has been constipated for the last 4 days.  Rectal pain has decreased currently at the level of 1/10.  Review of Systems See above   Past Medical History:  Diagnosis Date   Arthritis    CAD (coronary artery disease)    a. NSTEMI 10/2017 s/p multivessel PCI of the OM1, PDA, PLA ostium and mid PLA.   CKD (chronic kidney disease), stage II    GERD (gastroesophageal reflux disease)    Gout    Hypertension    Insulin dependent diabetes mellitus     Past Surgical History:  Procedure Laterality Date   CORONARY STENT INTERVENTION N/A 10/17/2017   Procedure: CORONARY STENT INTERVENTION;  Surgeon: Corky Crafts, MD;  Location: [facility] INVASIVE CV LAB;  Service: Cardiovascular;  Laterality: N/A;   EYE SURGERY     FRACTURE SURGERY     right ring finger   KNEE ARTHROSCOPY WITH LATERAL MENISECTOMY Left 01/22/2013   Procedure: LEFT KNEE ARTHROSCOPY DEBRIDEMENT CHONDROPLASTY, LATERAL RELEASE AND partial medial meniscectomy;  Surgeon: Javier Docker, MD;  Location: [facility] ORS;  Service: Orthopedics;  Laterality: Left;   KNEE SURGERY     "removed bone"   LEFT HEART CATH N/A  10/17/2017   Procedure: Left Heart Cath;  Surgeon: Corky Crafts, MD;  Location: [facility] INVASIVE CV LAB;  Service: Cardiovascular;  Laterality: N/A;   LEFT HEART CATH AND CORONARY ANGIOGRAPHY N/A 10/15/2017   Procedure: LEFT HEART CATH AND CORONARY ANGIOGRAPHY;  Surgeon: Marykay Lex, MD;  Location: [facility] INVASIVE CV LAB;  Service: Cardiovascular;  Laterality: N/A;   SHOULDER SURGERY      Current Outpatient Medications  Medication Instructions   acetaminophen (TYLENOL) 650 mg, Oral, Every 4 hours PRN   allopurinol (ZYLOPRIM) 200 mg, Oral, Daily   amLODipine (NORVASC) 10 mg, Oral, Daily   amoxicillin-clavulanate (AUGMENTIN) 875-125 MG tablet 1 tablet, Oral, Every 12 hours   aspirin 81 mg, Oral, Daily   atorvastatin (LIPITOR) 80 mg, Oral, Daily-1800   carvedilol (COREG) 25 mg, Oral, 2 times daily with meals   ciprofloxacin (CIPRO) 500 mg, Oral, 2 times daily   Continuous Blood Gluc Receiver (FREESTYLE LIBRE 2 READER) DEVI 1 each, Does not apply, Every 14 days   Continuous Blood Gluc Sensor (FREESTYLE LIBRE 2 SENSOR) MISC 1 each, Does not apply, Every 14 days   diclofenac Sodium (VOLTAREN) 2 g, Topical, 4 times daily PRN   ezetimibe (ZETIA) 10 mg, Oral, Daily   ferrous sulfate 325 mg, Oral, Every morning   furosemide (LASIX) 40 mg, Oral, Daily   Insulin Pen Needle (PEN NEEDLES) 32G X 4 MM MISC  1 each, Does not apply, Daily   Levemir FlexTouch 10 Units, Subcutaneous, Daily at bedtime, Start with 10 units every night. Titrate up by 2 units every 3 days if morning/fasting glucose is higher than 130 consistently.   lisinopril (ZESTRIL) 20 mg, Oral, Daily   nitroGLYCERIN (NITROSTAT) 0.4 mg, Sublingual, Every 5 min PRN   oxyCODONE (ROXICODONE) 2.5-5 mg, Oral, Every 4 hours PRN   pantoprazole (PROTONIX) 40 mg, Oral, 2 times daily   polyethylene glycol (MIRALAX) 17 g, Oral, Daily   sitaGLIPtin (JANUVIA) 50 mg, Oral, Daily   Vitamin D (Ergocalciferol) (DRISDOL) 50,000 Units, Oral, Every 7 days        Objective:   Physical Exam BP 136/66   Pulse 87   Temp 98.4 F (36.9 C) (Oral)   Resp 16   Ht 5\' 8"  (1.727 m)   Wt 210 lb 4 oz (95.4 kg)   SpO2 99%   BMI 31.97 kg/m  General:   Well developed, NAD, BMI noted.  HEENT:  Normocephalic . Face symmetric, atraumatic Lungs:  CTA B Normal respiratory effort, no intercostal retractions, no accessory muscle use. Heart: RRR,  no murmur.  Abdomen:  Not distended, soft, non-tender. No rebound or rigidity. DRE: External inspection without obvious redness, no openings.  Perhaps mild swelling at the right side of the anus without fluctuance ; no abscess. Rectal exam showed no stool impaction, no mass  skin: Not pale. Not jaundice Lower extremities: no pretibial edema bilaterally  Neurologic:  alert & oriented X3.  Speech normal, gait appropriate for age and unassisted Psych--  Cognition and judgment appear intact.  Cooperative with normal attention span and concentration.  Behavior appropriate. No anxious or depressed appearing.     Assessment    51 year old male, PMH includes DM, high cholesterol, HTN, CAD, CHF, GERD, gastroparesis, impact multiple medications, presents for emergency room follow-up:  Rectal abscess: The patient had a perirectal abscess, treated conservatively with antibiotic, currently with no fever or chills.  Pain has decreased to a level of 1/10.  He seems to be responding well, this is the second episode of an abscess at that area. Plan: Finish antibiotics, prevent constipation, see AVS, recheck in 1 month, sooner if needed, surgical referral?. Urinary retention: Has a Foley in, denies hematuria, rec to reach out to urology for prompt follow-up.  In the meantime I will prescribe Flomax temporarily which hopefully will help a voiding trial. RTC 1 month, see AVS.

## 2022-06-16 ENCOUNTER — Other Ambulatory Visit: Payer: Self-pay

## 2022-06-16 ENCOUNTER — Emergency Department (HOSPITAL_BASED_OUTPATIENT_CLINIC_OR_DEPARTMENT_OTHER)
Admission: EM | Admit: 2022-06-16 | Discharge: 2022-06-16 | Disposition: A | Discharge status: Home / Self Care | Payer: Medicare Other | Attending: Emergency Medicine | Admitting: Emergency Medicine

## 2022-06-16 ENCOUNTER — Encounter (HOSPITAL_BASED_OUTPATIENT_CLINIC_OR_DEPARTMENT_OTHER): Payer: Self-pay | Admitting: Emergency Medicine

## 2022-06-16 DIAGNOSIS — Z794 Long term (current) use of insulin: Secondary | ICD-10-CM | POA: Insufficient documentation

## 2022-06-16 DIAGNOSIS — I2510 Atherosclerotic heart disease of native coronary artery without angina pectoris: Secondary | ICD-10-CM | POA: Diagnosis not present

## 2022-06-16 DIAGNOSIS — E109 Type 1 diabetes mellitus without complications: Secondary | ICD-10-CM | POA: Diagnosis not present

## 2022-06-16 DIAGNOSIS — N189 Chronic kidney disease, unspecified: Secondary | ICD-10-CM | POA: Diagnosis not present

## 2022-06-16 DIAGNOSIS — Z466 Encounter for fitting and adjustment of urinary device: Secondary | ICD-10-CM | POA: Insufficient documentation

## 2022-06-16 DIAGNOSIS — Z79899 Other long term (current) drug therapy: Secondary | ICD-10-CM | POA: Diagnosis not present

## 2022-06-16 DIAGNOSIS — I1310 Hypertensive heart and chronic kidney disease without heart failure, with stage 1 through stage 4 chronic kidney disease, or unspecified chronic kidney disease: Secondary | ICD-10-CM | POA: Insufficient documentation

## 2022-06-16 NOTE — Discharge Instructions (Signed)
Return to the emergency room if you are unable to urinate in the next 8 to 10 hours

## 2022-06-16 NOTE — ED Triage Notes (Signed)
Patient states he had a catheter placed over a week ago and was suppose to have it removed Monday, but due to the holiday, they cannot get him in until the 19th. States catheter is uncomfortable and wants it removed.

## 2022-06-16 NOTE — ED Notes (Signed)
Pt d/c home per MD order. Discharge summary reviewed with pt, pt verbalizes understanding. Ambulatory off unit. No s/s of acute distress noted at discharge.  °

## 2022-06-16 NOTE — ED Provider Notes (Signed)
[facility] EMERGENCY DEPARTMENT Provider Note   CSN: [id number] Arrival date & time: 06/16/22  1520     History  Chief Complaint  Patient presents with   Urinary Retention    Matthew Fisher is a 51 y.o. male.  Patient is a 51 year old male with a history of hypertension, CAD, insulin-dependent diabetes, CKD who is here today requesting to have his Foley catheter removed.  Patient had a Foley catheter placed 8 days ago after experiencing urinary retention and concern for developing perirectal inflammation.  He had a prior history of perirectal abscess that required I&D but CT scan did not show drainable abscess.  He was on Cipro and that issue has completely resolved.  He has been on Flomax by his PCP and was supposed to have his catheter out last Monday but is still waiting to see the urologist.  Patient reports it is very uncomfortable and he just wishes to have it out.  He is not having any other complaints at this time.  The history is provided by the patient and medical records.       Home Medications Prior to Admission medications   Medication Sig Start Date End Date Taking? Authorizing Provider  acetaminophen (TYLENOL) 325 MG tablet Take 2 tablets (650 mg total) by mouth every 4 (four) hours as needed for headache or mild pain. 09/12/19   Swayze, Ava, DO  allopurinol (ZYLOPRIM) 100 MG tablet Take 2 tablets (200 mg total) by mouth daily. 12/04/21   Clayborne Dana, NP  amLODipine (NORVASC) 10 MG tablet Take 1 tablet (10 mg total) by mouth daily. 04/27/22   Clayborne Dana, NP  amoxicillin-clavulanate (AUGMENTIN) 875-125 MG tablet Take 1 tablet by mouth every 12 (twelve) hours. 06/08/22   Arthor Captain, PA-C  aspirin 81 MG chewable tablet Chew 1 tablet (81 mg total) by mouth daily. 10/20/17   Dunn, Raymon Mutton, PA-C  atorvastatin (LIPITOR) 80 MG tablet Take 1 tablet (80 mg total) by mouth daily at 6 PM. 03/30/22   Clayborne Dana, NP  carvedilol (COREG) 25 MG tablet Take 1 tablet  (25 mg total) by mouth 2 (two) times daily with a meal. 12/04/21   Clayborne Dana, NP  Continuous Blood Gluc Receiver (FREESTYLE LIBRE 2 READER) DEVI 1 each by Does not apply route every 14 (fourteen) days. 12/07/21   Clayborne Dana, NP  Continuous Blood Gluc Sensor (FREESTYLE LIBRE 2 SENSOR) MISC 1 each by Does not apply route every 14 (fourteen) days. 12/07/21   Clayborne Dana, NP  diclofenac Sodium (VOLTAREN) 1 % GEL Apply 2 g topically 4 (four) times daily as needed (leg pain). 11/11/20   Long, Arlyss Repress, MD  ezetimibe (ZETIA) 10 MG tablet Take 1 tablet (10 mg total) by mouth daily. 12/25/21   Georgeanna Lea, MD  ferrous sulfate 325 (65 FE) MG tablet Take 1 tablet (325 mg total) by mouth every morning. 12/04/21   Clayborne Dana, NP  furosemide (LASIX) 40 MG tablet Take 1 tablet (40 mg total) by mouth daily. 12/04/21   Clayborne Dana, NP  insulin detemir (LEVEMIR FLEXTOUCH) 100 UNIT/ML FlexPen Inject 10 Units into the skin at bedtime. Start with 10 units every night. Titrate up by 2 units every 3 days if morning/fasting glucose is higher than 130 consistently. 12/07/21   Clayborne Dana, NP  Insulin Pen Needle (PEN NEEDLES) 32G X 4 MM MISC 1 each by Does not apply route daily. 12/07/21   Hyman Hopes  B, NP  lisinopril (ZESTRIL) 20 MG tablet Take 1 tablet (20 mg total) by mouth daily. 12/04/21   Clayborne Dana, NP  nitroGLYCERIN (NITROSTAT) 0.4 MG SL tablet Place 1 tablet (0.4 mg total) under the tongue every 5 (five) minutes as needed for chest pain. Patient not taking: Reported on 06/12/2022 03/12/22 08/02/24  Hyman Hopes B, NP  oxyCODONE (ROXICODONE) 5 MG immediate release tablet Take 0.5-1 tablets (2.5-5 mg total) by mouth every 4 (four) hours as needed for severe pain. 06/08/22   Harris, Cammy Copa, PA-C  pantoprazole (PROTONIX) 40 MG tablet Take 1 tablet (40 mg total) by mouth 2 (two) times daily. 12/08/21   Clayborne Dana, NP  polyethylene glycol (MIRALAX) 17 g packet Take 17 g by mouth daily. Patient taking  differently: Take 17 g by mouth daily as needed for mild constipation. 07/04/19   Palumbo, April, MD  sitaGLIPtin (JANUVIA) 50 MG tablet Take 1 tablet (50 mg total) by mouth daily. 12/04/21   Clayborne Dana, NP  tamsulosin (FLOMAX) 0.4 MG CAPS capsule Take 1 capsule (0.4 mg total) by mouth daily after supper. 06/12/22   Wanda Plump, MD  Vitamin D, Ergocalciferol, (DRISDOL) 1.25 MG (50000 UT) CAPS capsule Take 50,000 Units by mouth every 7 (seven) days. 11/27/18   [provider]      Allergies    Patient has no known allergies.    Review of Systems   Review of Systems  Physical Exam Updated Vital Signs BP (!) 164/89   Pulse 87   Temp 98.6 F (37 C) (Oral)   Resp 18   Ht 5\' 8"  (1.727 m)   Wt 95.3 kg   SpO2 98%   BMI 31.93 kg/m  Physical Exam Vitals and nursing note reviewed.  Constitutional:      Appearance: Normal appearance.  Cardiovascular:     Rate and Rhythm: Normal rate.  Pulmonary:     Effort: Pulmonary effort is normal.  Abdominal:     General: Abdomen is flat.     Palpations: Abdomen is soft.  Neurological:     Mental Status: He is alert. Mental status is at baseline.     ED Results / Procedures / Treatments   Labs (all labs ordered are listed, but only abnormal results are displayed) Labs Reviewed - No data to display  EKG None  Radiology No results found.  Procedures Procedures    Medications Ordered in ED Medications - No data to display  ED Course/ Medical Decision Making/ A&P                           Medical Decision Making  Patient here today requesting to have the Foley catheter removed.  He is not having any complaints at this time.  He had perirectal inflammation last week and had been taking Cipro but that has completely resolved.  He has been on Flomax from his PCP for more than 3 days and denies a prior history of urinary retention.  Discussed with him waiting until he sees urology versus a removing the catheter here and ensuring  he can urinate.  Patient wishes to have it removed today.  He is not having any symptoms to make me concern for UTI or new acute underlying process.  We will remove the Foley catheter.  6:47 PM Patient has not urinated here but reports that he has not really had anything to drink.  Discussed with him getting him  something to drink and wait till he urinates but patient would prefer to go home.  He knows that there is a chance that he is not able to urinate and he reports he will come back if that is the case.       Final Clinical Impression(s) / ED Diagnoses Final diagnoses:  Encounter for Foley catheter removal    Rx / DC Orders ED Discharge Orders     None         Gwyneth Sprout, MD 06/16/22 1847

## 2022-07-03 ENCOUNTER — Telehealth: Payer: Self-pay | Admitting: Family Medicine

## 2022-07-03 NOTE — Telephone Encounter (Signed)
Pt's wife states they have fallen into the "donut hole" and ins is raising the prices for [place]. They would like to know if something else can be sent in please advise. She stated if her phone does not work to call [phone].

## 2022-07-10 ENCOUNTER — Other Ambulatory Visit: Payer: Self-pay | Admitting: Family Medicine

## 2022-07-10 DIAGNOSIS — E782 Mixed hyperlipidemia: Secondary | ICD-10-CM

## 2022-07-11 DIAGNOSIS — Z794 Long term (current) use of insulin: Secondary | ICD-10-CM | POA: Diagnosis not present

## 2022-07-11 DIAGNOSIS — Z961 Presence of intraocular lens: Secondary | ICD-10-CM | POA: Diagnosis not present

## 2022-07-11 DIAGNOSIS — H2512 Age-related nuclear cataract, left eye: Secondary | ICD-10-CM | POA: Diagnosis not present

## 2022-07-11 DIAGNOSIS — E113513 Type 2 diabetes mellitus with proliferative diabetic retinopathy with macular edema, bilateral: Secondary | ICD-10-CM | POA: Diagnosis not present

## 2022-07-11 DIAGNOSIS — H3589 Other specified retinal disorders: Secondary | ICD-10-CM | POA: Diagnosis not present

## 2022-07-11 DIAGNOSIS — H26491 Other secondary cataract, right eye: Secondary | ICD-10-CM | POA: Diagnosis not present

## 2022-07-13 ENCOUNTER — Ambulatory Visit: Payer: Medicare Other | Admitting: Internal Medicine

## 2022-07-13 NOTE — Progress Notes (Signed)
Office Visit Note  Patient: Matthew Fisher             Date of Birth: 01/05/1971           MRN: [id number]             PCP: Terrilyn Saver, NP Referring: Rosemarie Ax, MD Visit Date: 07/26/2022 Occupation: '@GUAROCC' @  Subjective:  Pain in multiple joints  History of Present Illness: Reyn Faivre is a 51 y.o. male seen in consultation per request of Dr. Raeford Razor.  According to the patient his symptoms started in 1991 with bilateral knee joint pain and swelling.  He states he was followed by [facility] for many years and had knee joint aspirations.  In 1994 he was diagnosed with gouty arthropathy by [facility].  At the time he was given a prescription for indomethacin.  He states he used to have flares every 3 months and then gradually the flares became less often.  He states that he took indomethacin for each flare.  He has had right knee joint 3 surgeries and left knee joint 2 surgeries over the years.  He was recently seen by Dr. Marlou Sa and was advised bilateral total knee replacement due to severe osteoarthritis involving his knees now.  He states the last flare of gout was in July.  He has had frequent gout flares in the past involving his elbows, bilateral wrist, bilateral hands, bilateral ankles, bilateral knees and his great toe.  He has had contractures in his elbows for many years.  He states he started allopurinol about 5 years ago and has been taking colchicine on as needed basis.  In 2023 he had 3 flares.  There is no family history of gout.  He wants me to refer him to a kidney specialist in [place].  Activities of Matthew Living:  Patient reports morning stiffness for 30 minutes.   Patient Reports nocturnal pain.  Difficulty dressing/grooming: Denies Difficulty climbing stairs: Reports Difficulty getting out of chair: Reports Difficulty using hands for taps, buttons, cutlery, and/or writing: Reports  Review of Systems  Constitutional:  Negative  for fatigue.  HENT:  Negative for mouth sores and mouth dryness.   Eyes:  Negative for dryness.  Respiratory:  Negative for shortness of breath.   Cardiovascular:  Negative for chest pain and palpitations.  Gastrointestinal:  Negative for blood in stool, constipation and diarrhea.  Endocrine: Negative for increased urination.  Genitourinary:  Positive for urgency. Negative for involuntary urination.  Musculoskeletal:  Positive for joint pain, joint pain, joint swelling and morning stiffness. Negative for gait problem, myalgias, muscle weakness, muscle tenderness and myalgias.  Skin:  Negative for color change, rash, hair loss and sensitivity to sunlight.  Allergic/Immunologic: Negative for susceptible to infections.  Neurological:  Negative for dizziness and headaches.  Hematological:  Negative for swollen glands.  Psychiatric/Behavioral:  Negative for depressed mood and sleep disturbance. The patient is not nervous/anxious.     PMFS History:  Patient Active Problem List   Diagnosis Date Noted   Primary osteoarthritis of both knees 01/09/2022   Gout 12/21/2021   GERD (gastroesophageal reflux disease) 12/21/2021   Arthritis 12/21/2021   Gastroesophageal reflux disease with esophagitis 12/04/2021   Chronic gout of knee 12/04/2021   Mixed hyperlipidemia 12/04/2021   Iron deficiency 12/04/2021   Acute gout due to renal impairment involving left knee 09/28/2021   Synovitis of right knee 09/28/2021   CHF (congestive heart failure) ([place]) 09/11/2019   Nonadherence to medical  treatment    Tobacco abuse disorder    CAD (coronary artery disease) 10/18/2017   CKD (chronic kidney disease), stage II 10/18/2017   NSTEMI (non-ST elevated myocardial infarction) ([place]) 10/15/2017   Type 2 diabetes mellitus with hyperglycemia, without long-term current use of insulin ([place]) 10/15/2017   Hypertension 10/15/2017   Gastroparesis due to secondary diabetes ([place]) 10/15/2017   Hypertensive emergency, no  CHF     Past Medical History:  Diagnosis Date   Arthritis    CAD (coronary artery disease)    a. NSTEMI 10/2017 s/p multivessel PCI of the OM1, PDA, PLA ostium and mid PLA.   CKD (chronic kidney disease), stage II    GERD (gastroesophageal reflux disease)    Gout    Hypertension    Insulin dependent diabetes mellitus     Family History  Problem Relation Age of Onset   Diabetes Mother    Cancer Father    Healthy Daughter    Healthy Daughter    Healthy Son    Past Surgical History:  Procedure Laterality Date   CORONARY STENT INTERVENTION N/A 10/17/2017   Procedure: CORONARY STENT INTERVENTION;  Surgeon: Jettie Booze, MD;  Location: [place];  Service: Cardiovascular;  Laterality: N/A;   EYE SURGERY     FRACTURE SURGERY     right ring finger   KNEE ARTHROSCOPY WITH LATERAL MENISECTOMY Left 01/22/2013   Procedure: LEFT KNEE ARTHROSCOPY DEBRIDEMENT CHONDROPLASTY, LATERAL RELEASE AND partial medial meniscectomy;  Surgeon: Johnn Hai, MD;  Location: [facility];  Service: Orthopedics;  Laterality: Left;   KNEE SURGERY     "removed bone"   LEFT HEART CATH N/A 10/17/2017   Procedure: Left Heart Cath;  Surgeon: Jettie Booze, MD;  Location: [facility];  Service: Cardiovascular;  Laterality: N/A;   LEFT HEART CATH AND CORONARY ANGIOGRAPHY N/A 10/15/2017   Procedure: LEFT HEART CATH AND CORONARY ANGIOGRAPHY;  Surgeon: Leonie Man, MD;  Location: [place] [facility];  Service: Cardiovascular;  Laterality: N/A;   SHOULDER SURGERY Bilateral    Social History   Social History Narrative   Not on file   Immunization History  Administered Date(s) Administered   PFIZER(Purple Top)SARS-COV-2 Vaccination 04/19/2020, 05/10/2020   Tdap 12/12/2004, 05/09/2011     Objective: Vital Signs: BP (!) 188/104 (BP Location: Right Arm, Patient Position: Sitting, Cuff Size: Large)   Pulse 94   Resp 18   Ht '5\' 8"'  (1.727 m)   Wt 212 lb 6.4 oz (96.3 kg)   BMI 32.30  kg/m    Physical Exam Vitals and nursing note reviewed.  Constitutional:      Appearance: He is well-developed.  HENT:     Head: Normocephalic and atraumatic.  Eyes:     Conjunctiva/sclera: Conjunctivae normal.     Pupils: Pupils are equal, round, and reactive to light.  Cardiovascular:     Rate and Rhythm: Normal rate and regular rhythm.     Heart sounds: Normal heart sounds.  Pulmonary:     Effort: Pulmonary effort is normal.     Breath sounds: Normal breath sounds.  Abdominal:     General: Bowel sounds are normal.     Palpations: Abdomen is soft.  Musculoskeletal:     Cervical back: Normal range of motion and neck supple.  Skin:    General: Skin is warm and dry.     Capillary Refill: Capillary refill takes less than 2 seconds.  Neurological:     Mental Status:  He is alert and oriented to person, place, and time.  Psychiatric:        Behavior: Behavior normal.      Musculoskeletal Exam: Cervical spine was in good range of motion.  He had no difficulty with mobility of the thoracic or lumbar spine.  Shoulder joints were in good range of motion.  He had bilateral elbow joint contractures about 15 degrees.  He had tophi present over bilateral ankles.  He had some limitation of extension of his wrist joints.  He had bilateral PIP and DIP thickening with limited extension of several of his PIP joints.  Hip joints with good range of motion.  Knee joint had limited extension and surgical scar on his right knee.  Left knee joint had warmth on palpation.  Ankle joints had limited range of motion.  There was no PIP or DIP thickening in his feet.  CDAI Exam: CDAI Score: -- Patient Global: --; Provider Global: -- Swollen: --; Tender: -- Joint Exam 07/26/2022   No joint exam has been documented for this visit   There is currently no information documented on the homunculus. Go to the Rheumatology activity and complete the homunculus joint exam.  Investigation: No additional  findings.  Imaging: No results found.  Recent Labs: Lab Results  Component Value Date   WBC 11.1 (H) 06/08/2022   HGB 10.9 (L) 06/08/2022   PLT 214 06/08/2022   NA 132 (L) 06/08/2022   K 4.1 06/08/2022   CL 101 06/08/2022   CO2 23 06/08/2022   GLUCOSE 220 (H) 06/08/2022   BUN 26 (H) 06/08/2022   CREATININE 2.67 (H) 06/08/2022   BILITOT 1.0 06/08/2022   ALKPHOS 110 06/08/2022   AST 16 06/08/2022   ALT 15 06/08/2022   PROT 7.0 06/08/2022   ALBUMIN 3.7 06/08/2022   CALCIUM 9.0 06/08/2022   GFRAA 42 (L) 09/12/2019    Speciality Comments: No specialty comments available.  Procedures:  No procedures performed Allergies: Patient has no known allergies.   Assessment / Plan:     Visit Diagnoses: Idiopathic chronic gout of multiple sites with tophus -patient was diagnosed with gout in 1994.  Although his symptoms a started in 1991.  He was treated with indomethacin for many years.  He states for the last 5 years he has been on allopurinol.  He takes colchicine on as needed basis.  His gout flares used to be almost every 3 months and have been less frequent about 3-4 times a year now.  His last gout flare was in July.  He states the gout has involved his elbows, wrists, hands, knees, ankles and his feet.  He had tophi present over his elbow and some of his PIP joints.  Detailed counseling regarding gouty arthropathy was provided.  Low purine diet was also discussed.  Abstinence from alcohol was discussed.  Information was placed in the AVS.  His last uric acid was in December 2022.  I will check uric acid today.  I advised him to increase the dose of allopurinol to 300 mg p.o. Matthew.  He has multiple allopurinol 100 mg tablets at home.  Once he finishes allopurinol tablets then we can send a new prescription for allopurinol 300 mg p.o. Matthew.  I advised him to stay on colchicine 0.6 mg p.o. Matthew for couple of weeks and then he may discontinue it.  He can use colchicine on as needed basis.   The goal will be to keep his uric acid about 4.0.  09/25/21: uric acid 6.6, synovial analysis-visible clumping present.  no crystals found.  WBC 5,125, ANA negative, RF<10, Anti-CCP<1, ESR 82, CRP 26 - Plan: Sedimentation rate, Uric acid  Pain in both hands -he complains of pain and stiffness in his bilateral hands.  He had bilateral PIP contractures and thickening most likely due to uric acid deposition.  Plan: XR Hand 2 View Right, XR Hand 2 View Left.  X-rays are consistent with osteoarthritis and gouty arthropathy overlap.  Erosive changes were noted.  Contracture of joint of both elbows-he had bilateral elbow joint contractures which she states have been for many years and had tophi on his elbows.  Primary osteoarthritis of both knees-patient states that he had 3 surgeries on his right knee and 2 surgeries on his left knee.  He had warmth on palpation of his left knee joint.  He was advised  total knee replacement by Dr. Marlou Sa.  Pain in both feet -he had limited range of motion in his ankles.  He also has chronic discomfort in his feet.  No synovitis was noted.  Plan: XR Foot 2 Views Right, XR Foot 2 Views Left.  X-rays are consistent with osteoarthritis and gouty arthropathy overlap.  CKD (chronic kidney disease), stage II-creatinine was 2.67 on June 08, 2022.  Patient requests referral to [facility].  I will place the referral.  Other medical problems listed as follows:  NSTEMI (non-ST elevated myocardial infarction) ([place])  Hypertensive emergency, no CHF-his blood pressure was elevated at 188/104 today.  Patient states that he forgot to take his blood pressure medication.  He has been also monitoring his blood pressure at home.  I advised him to monitor his blood pressure closely and reach out to his PCP if his blood pressure does not come down after taking the blood pressure medication.  Coronary artery disease involving native coronary artery of native heart without  angina pectoris  Mixed hyperlipidemia-increased risk of heart disease with gout was also discussed.  Dietary modifications-diet was discussed.  Type 2 diabetes mellitus with hyperglycemia, without long-term current use of insulin ([facility])  Gastroparesis due to secondary diabetes ([place])  History of gastroesophageal reflux (GERD)  Iron deficiency  Tobacco abuse disorder - Smoked half a pack per day for 20 years and quit smoking in 2004.  He vapes now.  Orders: Orders Placed This Encounter  Procedures   XR Hand 2 View Right   XR Hand 2 View Left   XR Foot 2 Views Right   XR Foot 2 Views Left   Sedimentation rate   Uric acid   No orders of the defined types were placed in this encounter.    Follow-Up Instructions: Return for Gout.   Bo Merino, MD  Note - This record has been created using Editor, commissioning.  Chart creation errors have been sought, but may not always  have been located. Such creation errors do not reflect on  the standard of medical care.,

## 2022-07-13 NOTE — Telephone Encounter (Signed)
Pt's wife notified of dosage increase and to have pt send in glucose readings next week.

## 2022-07-26 ENCOUNTER — Ambulatory Visit (INDEPENDENT_AMBULATORY_CARE_PROVIDER_SITE_OTHER): Payer: Medicare Other

## 2022-07-26 ENCOUNTER — Ambulatory Visit: Payer: Medicare Other | Attending: Rheumatology | Admitting: Rheumatology

## 2022-07-26 ENCOUNTER — Encounter: Payer: Self-pay | Admitting: Rheumatology

## 2022-07-26 VITALS — BP 188/104 | HR 94 | Resp 18 | Ht 68.0 in | Wt 212.4 lb

## 2022-07-26 DIAGNOSIS — M79641 Pain in right hand: Secondary | ICD-10-CM

## 2022-07-26 DIAGNOSIS — M170 Bilateral primary osteoarthritis of knee: Secondary | ICD-10-CM | POA: Diagnosis not present

## 2022-07-26 DIAGNOSIS — M24522 Contracture, left elbow: Secondary | ICD-10-CM

## 2022-07-26 DIAGNOSIS — M10371 Gout due to renal impairment, right ankle and foot: Secondary | ICD-10-CM

## 2022-07-26 DIAGNOSIS — M79642 Pain in left hand: Secondary | ICD-10-CM

## 2022-07-26 DIAGNOSIS — E782 Mixed hyperlipidemia: Secondary | ICD-10-CM

## 2022-07-26 DIAGNOSIS — N182 Chronic kidney disease, stage 2 (mild): Secondary | ICD-10-CM | POA: Diagnosis not present

## 2022-07-26 DIAGNOSIS — M1A09X1 Idiopathic chronic gout, multiple sites, with tophus (tophi): Secondary | ICD-10-CM | POA: Diagnosis not present

## 2022-07-26 DIAGNOSIS — M79671 Pain in right foot: Secondary | ICD-10-CM

## 2022-07-26 DIAGNOSIS — E1343 Other specified diabetes mellitus with diabetic autonomic (poly)neuropathy: Secondary | ICD-10-CM | POA: Diagnosis not present

## 2022-07-26 DIAGNOSIS — E1165 Type 2 diabetes mellitus with hyperglycemia: Secondary | ICD-10-CM

## 2022-07-26 DIAGNOSIS — Z720 Tobacco use: Secondary | ICD-10-CM

## 2022-07-26 DIAGNOSIS — E611 Iron deficiency: Secondary | ICD-10-CM

## 2022-07-26 DIAGNOSIS — M659 Synovitis and tenosynovitis, unspecified: Secondary | ICD-10-CM

## 2022-07-26 DIAGNOSIS — I214 Non-ST elevation (NSTEMI) myocardial infarction: Secondary | ICD-10-CM

## 2022-07-26 DIAGNOSIS — Z8719 Personal history of other diseases of the digestive system: Secondary | ICD-10-CM

## 2022-07-26 DIAGNOSIS — I2510 Atherosclerotic heart disease of native coronary artery without angina pectoris: Secondary | ICD-10-CM

## 2022-07-26 DIAGNOSIS — M79672 Pain in left foot: Secondary | ICD-10-CM

## 2022-07-26 DIAGNOSIS — I161 Hypertensive emergency: Secondary | ICD-10-CM

## 2022-07-26 DIAGNOSIS — M24521 Contracture, right elbow: Secondary | ICD-10-CM

## 2022-07-26 NOTE — Patient Instructions (Signed)
Increase allopurinol to 300 mg daily (100 mg tablet, 3 tablets daily). When you get a new prescription allopurinol will be 300 mg tablet and you will take only 1 tablet a day. Please take colchicine 0.6 mg 1 tablet daily for 2 weeks and later he can take as needed for flares.  Gout  Gout is painful swelling of your joints. Gout is a type of arthritis. It is caused by having too much uric acid in your body. Uric acid is a chemical that is made when your body breaks down substances called purines. If your body has too much uric acid, sharp crystals can form and build up in your joints. This causes pain and swelling. Gout attacks can happen quickly and be very painful (acute gout). Over time, the attacks can affect more joints and happen more often (chronic gout). What are the causes? Gout is caused by too much uric acid in your blood. This can happen because: Your kidneys do not remove enough uric acid from your blood. Your body makes too much uric acid. You eat too many foods that are high in purines. These foods include organ meats, some seafood, and beer. Trauma or stress can bring on an attack. What increases the risk? Having a family history of gout. Being male and middle-aged. Being male and having gone through menopause. Having an organ transplant. Taking certain medicines. Having certain conditions, such as: Being very overweight (obese). Lead poisoning. Kidney disease. A skin condition called psoriasis. Other risks include: Losing weight too quickly. Not having enough water in the body (being dehydrated). Drinking alcohol, especially beer. Drinking beverages that are sweetened with a type of sugar called fructose. What are the signs or symptoms? An attack of acute gout often starts at night and usually happens in just one joint. The most common place is the big toe. Other joints that may be affected include joints of the feet, ankle, knee, fingers, wrist, or elbow. Symptoms  may include: Very bad pain. Warmth. Swelling. Stiffness. Tenderness. The affected joint may be very painful to touch. Shiny, red, or purple skin. Chills and fever. Chronic gout may cause symptoms more often. More joints may be involved. You may also have white or yellow lumps (tophi) on your hands or feet or in other areas near your joints. How is this treated? Treatment for an acute attack may include medicines for pain and swelling, such as: NSAIDs, such as ibuprofen. Steroids taken by mouth or injected into a joint. Colchicine. This can be given by mouth or through an IV tube. Treatment to prevent future attacks may include: Taking small doses of NSAIDs or colchicine daily. Using a medicine that reduces uric acid levels in your blood, such as allopurinol. Making changes to your diet. You may need to see a food expert (dietitian) about what to eat and drink to prevent gout. Follow these instructions at home: During a gout attack  If told, put ice on the painful area. To do this: Put ice in a plastic bag. Place a towel between your skin and the bag. Leave the ice on for 20 minutes, 2-3 times a day. Take off the ice if your skin turns bright red. This is very important. If you cannot feel pain, heat, or cold, you have a greater risk of damage to the area. Raise the painful joint above the level of your heart as often as you can. Rest the joint as much as possible. If the joint is in your leg, you may  be given crutches. Follow instructions from your doctor about what you cannot eat or drink. Avoiding future gout attacks Eat a low-purine diet. Avoid foods and drinks such as: Liver. Kidney. Anchovies. Asparagus. Herring. Mushrooms. Mussels. Beer. Stay at a healthy weight. If you want to lose weight, talk with your doctor. Do not lose weight too fast. Start or continue an exercise plan as told by your doctor. Eating and drinking Avoid drinks sweetened by fructose. Drink enough  fluids to keep your pee (urine) pale yellow. If you drink alcohol: Limit how much you have to: 0-1 drink a day for women who are not pregnant. 0-2 drinks a day for men. Know how much alcohol is in a drink. In the [place] one drink equals one 12 oz bottle of beer (355 mL), one 5 oz glass of wine (148 mL), or one 1 oz glass of hard liquor (44 mL). General instructions Take over-the-counter and prescription medicines only as told by your doctor. Ask your doctor if you should avoid driving or using machines while you are taking your medicine. Return to your normal activities when your doctor says that it is safe. Keep all follow-up visits. Where to find more information [employer]: www.niams.http://www.myers.net/ Contact a doctor if: You have another gout attack. You still have symptoms of a gout attack after 10 days of treatment. You have problems (side effects) because of your medicines. You have chills or a fever. You have burning pain when you pee (urinate). You have pain in your lower back or belly. Get help right away if: You have very bad pain. Your pain cannot be controlled. You cannot pee. Summary Gout is painful swelling of the joints. The most common site of pain is the big toe, but it can affect other joints. Medicines and avoiding some foods can help to prevent and treat gout attacks. This information is not intended to replace advice given to you by your health care provider. Make sure you discuss any questions you have with your health care provider. Document Revised: 06/28/2021 Document Reviewed: 06/28/2021 Elsevier Patient Education  2023 Elsevier Inc. Low-Purine Eating Plan A low-purine eating plan involves making food choices to limit your purine intake. Purine is a kind of uric acid. Too much uric acid in your blood can cause certain conditions, such as gout and kidney stones. Eating a low-purine diet may help control these conditions. What are tips for following  this plan? Shopping Avoid buying products that contain high-fructose corn syrup. Check for this on food labels. It is commonly found in many processed foods and soft drinks. Be sure to check for it in baked goods such as cookies, canned fruits, and cereals and cereal bars. Avoid buying veal, chicken breast with skin, lamb, and organ meats such as liver. These types of meats tend to have the highest purine content. Choose dairy products. These may lower uric acid levels. Avoid certain types of fish. Not all fish and seafood have high purine content. Examples with high purine content include anchovies, trout, tuna, sardines, and salmon. Avoid buying beverages that contain alcohol, particularly beer and hard liquor. Alcohol can affect the way your body gets rid of uric acid. Meal planning  Learn which foods do or do not affect you. If you find out that a food tends to cause your gout symptoms to flare up, avoid eating that food. You can enjoy foods that do not cause problems. If you have any questions about a food item, talk with your  dietitian or health care provider. Reduce the overall amount of meat in your diet. When you do eat meat, choose ones with lower purine content. Include plenty of fruits and vegetables. Although some vegetables may have a high purine content--such as asparagus, mushrooms, spinach, or cauliflower--it has been shown that these do not contribute to uric acid blood levels as much. Consume at least 1 dairy serving a day. This has been shown to decrease uric acid levels. General information If you drink alcohol: Limit how much you have to: 0-1 drink a day for women who are not pregnant. 0-2 drinks a day for men. Know how much alcohol is in a drink. In the [place] one drink equals one 12 oz bottle of beer (355 mL), one 5 oz glass of wine (148 mL), or one 1 oz glass of hard liquor (44 mL). Drink plenty of water. Try to drink enough to keep your urine pale yellow. Fluids can help  remove uric acid from your body. Work with your health care provider and dietitian to develop a plan to achieve or maintain a healthy weight. Losing weight may help reduce uric acid in your blood. What foods are recommended? The following are some types of foods that are good choices when limiting purine intake: Fresh or frozen fruits and vegetables. Whole grains, breads, cereals, and pasta. Rice. Beans, peas, legumes. Nuts and seeds. Dairy products. Fats and oils. The items listed above may not be a complete list. Talk with a dietitian about what dietary choices are best for you. What foods are not recommended? Limit your intake of foods high in purines, including: Beer and other alcohol. Meat-based gravy or sauce. Canned or fresh fish, such as: Anchovies, sardines, herring, salmon, and tuna. Mussels and scallops. Codfish, trout, and haddock. Bacon, veal, chicken breast with skin, and lamb. Organ meats, such as: Liver or kidney. Tripe. Sweetbreads (thymus gland or pancreas). Wild Clinical biochemist. Yeast or yeast extract supplements. Drinks sweetened with high-fructose corn syrup, such as soda. Processed foods made with high-fructose corn syrup. The items listed above may not be a complete list of foods and beverages you should limit. Contact a dietitian for more information. Summary Eating a low-purine diet may help control conditions caused by too much uric acid in the body, such as gout or kidney stones. Choose low-purine foods, limit alcohol, and limit high-fructose corn syrup. You will learn over time which foods do or do not affect you. If you find out that a food tends to cause your gout symptoms to flare up, avoid eating that food. This information is not intended to replace advice given to you by your health care provider. Make sure you discuss any questions you have with your health care provider. Document Revised: 09/07/2021 Document Reviewed: 09/07/2021 Elsevier Patient  Education  [place].

## 2022-07-27 LAB — SEDIMENTATION RATE: Sed Rate: 29 mm/h — ABNORMAL HIGH (ref 0–20)

## 2022-07-27 LAB — URIC ACID: Uric Acid, Serum: 7.9 mg/dL (ref 4.0–8.0)

## 2022-07-27 NOTE — Progress Notes (Signed)
Sed rate is mildly elevated with an improved.  Uric acid is still elevated at 7.9.  He should increase allopurinol dose to 300 mg a day as discussed during the office visit.

## 2022-08-01 ENCOUNTER — Other Ambulatory Visit: Payer: Self-pay | Admitting: Internal Medicine

## 2022-08-01 DIAGNOSIS — R739 Hyperglycemia, unspecified: Secondary | ICD-10-CM | POA: Diagnosis not present

## 2022-08-01 DIAGNOSIS — Z1152 Encounter for screening for COVID-19: Secondary | ICD-10-CM | POA: Diagnosis not present

## 2022-08-01 DIAGNOSIS — Z794 Long term (current) use of insulin: Secondary | ICD-10-CM | POA: Diagnosis not present

## 2022-08-01 DIAGNOSIS — R6889 Other general symptoms and signs: Secondary | ICD-10-CM | POA: Diagnosis not present

## 2022-08-01 DIAGNOSIS — Z87891 Personal history of nicotine dependence: Secondary | ICD-10-CM | POA: Diagnosis not present

## 2022-08-01 DIAGNOSIS — S0990XA Unspecified injury of head, initial encounter: Secondary | ICD-10-CM | POA: Diagnosis not present

## 2022-08-01 DIAGNOSIS — Z743 Need for continuous supervision: Secondary | ICD-10-CM | POA: Diagnosis not present

## 2022-08-01 DIAGNOSIS — Y999 Unspecified external cause status: Secondary | ICD-10-CM | POA: Diagnosis not present

## 2022-08-01 DIAGNOSIS — E119 Type 2 diabetes mellitus without complications: Secondary | ICD-10-CM | POA: Diagnosis not present

## 2022-08-01 DIAGNOSIS — R55 Syncope and collapse: Secondary | ICD-10-CM | POA: Diagnosis not present

## 2022-08-01 DIAGNOSIS — W01198A Fall on same level from slipping, tripping and stumbling with subsequent striking against other object, initial encounter: Secondary | ICD-10-CM | POA: Diagnosis not present

## 2022-08-01 DIAGNOSIS — R569 Unspecified convulsions: Secondary | ICD-10-CM | POA: Diagnosis not present

## 2022-08-01 DIAGNOSIS — J9811 Atelectasis: Secondary | ICD-10-CM | POA: Diagnosis not present

## 2022-08-01 DIAGNOSIS — R404 Transient alteration of awareness: Secondary | ICD-10-CM | POA: Diagnosis not present

## 2022-08-01 DIAGNOSIS — R531 Weakness: Secondary | ICD-10-CM | POA: Diagnosis not present

## 2022-08-01 DIAGNOSIS — M1711 Unilateral primary osteoarthritis, right knee: Secondary | ICD-10-CM | POA: Diagnosis not present

## 2022-08-02 DIAGNOSIS — R9431 Abnormal electrocardiogram [ECG] [EKG]: Secondary | ICD-10-CM | POA: Diagnosis not present

## 2022-08-02 DIAGNOSIS — W01198A Fall on same level from slipping, tripping and stumbling with subsequent striking against other object, initial encounter: Secondary | ICD-10-CM | POA: Diagnosis not present

## 2022-08-02 DIAGNOSIS — J9811 Atelectasis: Secondary | ICD-10-CM | POA: Diagnosis not present

## 2022-08-02 DIAGNOSIS — S0990XA Unspecified injury of head, initial encounter: Secondary | ICD-10-CM | POA: Diagnosis not present

## 2022-08-02 DIAGNOSIS — R531 Weakness: Secondary | ICD-10-CM | POA: Diagnosis not present

## 2022-08-02 DIAGNOSIS — R55 Syncope and collapse: Secondary | ICD-10-CM | POA: Diagnosis not present

## 2022-08-02 DIAGNOSIS — M1711 Unilateral primary osteoarthritis, right knee: Secondary | ICD-10-CM | POA: Diagnosis not present

## 2022-08-02 DIAGNOSIS — Y999 Unspecified external cause status: Secondary | ICD-10-CM | POA: Diagnosis not present

## 2022-08-07 NOTE — Progress Notes (Deleted)
Office Visit Note  Patient: Matthew Fisher             Date of Birth: [birth year]           MRN: [id number]             PCP: Terrilyn Saver, NP Referring: Terrilyn Saver, NP Visit Date: 08/15/2022 Occupation: @GUAROCC @  Subjective:  No chief complaint on file.   History of Present Illness: Matthew Fisher is a 51 y.o. male ***   Activities of Daily Living:  Patient reports morning stiffness for *** {minute/hour:19697}.   Patient {ACTIONS;DENIES/REPORTS:21021675::"Denies"} nocturnal pain.  Difficulty dressing/grooming: {ACTIONS;DENIES/REPORTS:21021675::"Denies"} Difficulty climbing stairs: {ACTIONS;DENIES/REPORTS:21021675::"Denies"} Difficulty getting out of chair: {ACTIONS;DENIES/REPORTS:21021675::"Denies"} Difficulty using hands for taps, buttons, cutlery, and/or writing: {ACTIONS;DENIES/REPORTS:21021675::"Denies"}  No Rheumatology ROS completed.   PMFS History:  Patient Active Problem List   Diagnosis Date Noted   Primary osteoarthritis of both knees 01/09/2022   Gout 12/21/2021   GERD (gastroesophageal reflux disease) 12/21/2021   Arthritis 12/21/2021   Gastroesophageal reflux disease with esophagitis 12/04/2021   Chronic gout of knee 12/04/2021   Mixed hyperlipidemia 12/04/2021   Iron deficiency 12/04/2021   Acute gout due to renal impairment involving left knee 09/28/2021   Synovitis of right knee 09/28/2021   CHF (congestive heart failure) ([place]) 09/11/2019   Nonadherence to medical treatment    Tobacco abuse disorder    CAD (coronary artery disease) 10/18/2017   CKD (chronic kidney disease), stage II 10/18/2017   NSTEMI (non-ST elevated myocardial infarction) ([place]) 10/15/2017   Type 2 diabetes mellitus with hyperglycemia, without long-term current use of insulin ([place]) 10/15/2017   Hypertension 10/15/2017   Gastroparesis due to secondary diabetes ([place]) 10/15/2017   Hypertensive emergency, no CHF     Past Medical History:  Diagnosis Date   Arthritis    CAD  (coronary artery disease)    a. NSTEMI 10/2017 s/p multivessel PCI of the OM1, PDA, PLA ostium and mid PLA.   CKD (chronic kidney disease), stage II    GERD (gastroesophageal reflux disease)    Gout    Hypertension    Insulin dependent diabetes mellitus     Family History  Problem Relation Age of Onset   Diabetes Mother    Cancer Father    Healthy Daughter    Healthy Daughter    Healthy Son    Past Surgical History:  Procedure Laterality Date   CORONARY STENT INTERVENTION N/A 10/17/2017   Procedure: CORONARY STENT INTERVENTION;  Surgeon: Jettie Booze, MD;  Location: [facility];  Service: Cardiovascular;  Laterality: N/A;   EYE SURGERY     FRACTURE SURGERY     right ring finger   KNEE ARTHROSCOPY WITH LATERAL MENISECTOMY Left 01/22/2013   Procedure: LEFT KNEE ARTHROSCOPY DEBRIDEMENT CHONDROPLASTY, LATERAL RELEASE AND partial medial meniscectomy;  Surgeon: Johnn Hai, MD;  Location: [facility];  Service: Orthopedics;  Laterality: Left;   KNEE SURGERY     "removed bone"   LEFT HEART CATH N/A 10/17/2017   Procedure: Left Heart Cath;  Surgeon: Jettie Booze, MD;  Location: [facility];  Service: Cardiovascular;  Laterality: N/A;   LEFT HEART CATH AND CORONARY ANGIOGRAPHY N/A 10/15/2017   Procedure: LEFT HEART CATH AND CORONARY ANGIOGRAPHY;  Surgeon: Leonie Man, MD;  Location: [facility];  Service: Cardiovascular;  Laterality: N/A;   SHOULDER SURGERY Bilateral    Social History   Social History Narrative   Not on file   Immunization History  Administered Date(s) Administered   PFIZER(Purple Top)SARS-COV-2 Vaccination 04/19/2020, 05/10/2020   Tdap 12/12/2004, 05/09/2011     Objective: Vital Signs: There were no vitals taken for this visit.   Physical Exam   Musculoskeletal Exam: ***  CDAI Exam: CDAI Score: -- Patient Global: --; Provider Global: -- Swollen: --; Tender: -- Joint Exam 08/15/2022   No joint exam has been  documented for this visit   There is currently no information documented on the homunculus. Go to the Rheumatology activity and complete the homunculus joint exam.  Investigation: No additional findings.  Imaging: XR Foot 2 Views Left  Result Date: 07/26/2022 First MTP narrowing with erosive changes over metatarsal head was noted.  PIP and DIP narrowing was noted.  No intertarsal, tibiotalar or subtalar joint space narrowing was noted. Impression: These findings are consistent with osteoarthritis and gouty arthropathy overlap.  XR Foot 2 Views Right  Result Date: 07/26/2022 First MTP narrowing and subluxation was noted.  PIP and DIP narrowing was noted.  No intertarsal or tibiotalar joint space narrowing was noted.  No erosive changes were noted. Impression: These findings are consistent with osteoarthritis of the foot.  XR Hand 2 View Left  Result Date: 07/26/2022 PIP and DIP narrowing was noted.  Soft tissue swelling was noted around third and fifth PIP joints.  Erosive changes were noted over the second metatarsal head. Impression: These findings are consistent with osteoarthritis and gouty arthropathy overlap.  XR Hand 2 View Right  Result Date: 07/26/2022 Narrowing of all PIP and DIP joints was noted.  Most significant narrowing was noted between right first and third PIP joints.  Mild intercarpal and radiocarpal joint space narrowing was noted.  No erosive changes were noted. Impression: These findings are consistent with osteoarthritis and inflammatory arthritis overlap.   Recent Labs: Lab Results  Component Value Date   WBC 11.1 (H) 06/08/2022   HGB 10.9 (L) 06/08/2022   PLT 214 06/08/2022   NA 132 (L) 06/08/2022   K 4.1 06/08/2022   CL 101 06/08/2022   CO2 23 06/08/2022   GLUCOSE 220 (H) 06/08/2022   BUN 26 (H) 06/08/2022   CREATININE 2.67 (H) 06/08/2022   BILITOT 1.0 06/08/2022   ALKPHOS 110 06/08/2022   AST 16 06/08/2022   ALT 15 06/08/2022   PROT 7.0  06/08/2022   ALBUMIN 3.7 06/08/2022   CALCIUM 9.0 06/08/2022   GFRAA 42 (L) 09/12/2019      Speciality Comments: No specialty comments available.  Procedures:  No procedures performed Allergies: Patient has no known allergies.   Assessment / Plan:     Visit Diagnoses: No diagnosis found.  Orders: No orders of the defined types were placed in this encounter.  No orders of the defined types were placed in this encounter.   Face-to-face time spent with patient was *** minutes. Greater than 50% of time was spent in counseling and coordination of care.  Follow-Up Instructions: No follow-ups on file.   Pollyann Savoy, MD  Note - This record has been created using Animal nutritionist.  Chart creation errors have been sought, but may not always  have been located. Such creation errors do not reflect on  the standard of medical care.

## 2022-08-08 HISTORY — PX: EYE SURGERY: SHX253

## 2022-08-14 ENCOUNTER — Ambulatory Visit: Payer: Medicare Other | Admitting: Family Medicine

## 2022-08-15 ENCOUNTER — Ambulatory Visit: Payer: Medicare Other | Admitting: Rheumatology

## 2022-08-15 DIAGNOSIS — M19071 Primary osteoarthritis, right ankle and foot: Secondary | ICD-10-CM

## 2022-08-15 DIAGNOSIS — E611 Iron deficiency: Secondary | ICD-10-CM

## 2022-08-15 DIAGNOSIS — H2512 Age-related nuclear cataract, left eye: Secondary | ICD-10-CM | POA: Diagnosis not present

## 2022-08-15 DIAGNOSIS — E113513 Type 2 diabetes mellitus with proliferative diabetic retinopathy with macular edema, bilateral: Secondary | ICD-10-CM | POA: Diagnosis not present

## 2022-08-15 DIAGNOSIS — I2510 Atherosclerotic heart disease of native coronary artery without angina pectoris: Secondary | ICD-10-CM

## 2022-08-15 DIAGNOSIS — M19042 Primary osteoarthritis, left hand: Secondary | ICD-10-CM

## 2022-08-15 DIAGNOSIS — Z720 Tobacco use: Secondary | ICD-10-CM

## 2022-08-15 DIAGNOSIS — M1A09X1 Idiopathic chronic gout, multiple sites, with tophus (tophi): Secondary | ICD-10-CM

## 2022-08-15 DIAGNOSIS — H26491 Other secondary cataract, right eye: Secondary | ICD-10-CM | POA: Diagnosis not present

## 2022-08-15 DIAGNOSIS — M170 Bilateral primary osteoarthritis of knee: Secondary | ICD-10-CM

## 2022-08-15 DIAGNOSIS — E782 Mixed hyperlipidemia: Secondary | ICD-10-CM

## 2022-08-15 DIAGNOSIS — Z961 Presence of intraocular lens: Secondary | ICD-10-CM | POA: Diagnosis not present

## 2022-08-15 DIAGNOSIS — H35372 Puckering of macula, left eye: Secondary | ICD-10-CM | POA: Diagnosis not present

## 2022-08-15 DIAGNOSIS — I214 Non-ST elevation (NSTEMI) myocardial infarction: Secondary | ICD-10-CM

## 2022-08-15 DIAGNOSIS — E1343 Other specified diabetes mellitus with diabetic autonomic (poly)neuropathy: Secondary | ICD-10-CM

## 2022-08-15 DIAGNOSIS — Z79899 Other long term (current) drug therapy: Secondary | ICD-10-CM

## 2022-08-15 DIAGNOSIS — I161 Hypertensive emergency: Secondary | ICD-10-CM

## 2022-08-15 DIAGNOSIS — Z794 Long term (current) use of insulin: Secondary | ICD-10-CM | POA: Diagnosis not present

## 2022-08-15 DIAGNOSIS — M24521 Contracture, right elbow: Secondary | ICD-10-CM

## 2022-08-15 DIAGNOSIS — E1165 Type 2 diabetes mellitus with hyperglycemia: Secondary | ICD-10-CM

## 2022-08-15 DIAGNOSIS — N182 Chronic kidney disease, stage 2 (mild): Secondary | ICD-10-CM

## 2022-08-15 DIAGNOSIS — Z8719 Personal history of other diseases of the digestive system: Secondary | ICD-10-CM

## 2022-08-22 ENCOUNTER — Telehealth: Payer: Self-pay | Admitting: Orthopedic Surgery

## 2022-08-22 NOTE — Telephone Encounter (Signed)
Patient wife came in stating patient needs a new Handicap placcard please advise

## 2022-08-23 NOTE — Telephone Encounter (Signed)
Okay for 6 months 

## 2022-08-24 NOTE — Telephone Encounter (Signed)
IC advised could pick up at front desk.  

## 2022-09-10 ENCOUNTER — Encounter: Payer: Self-pay | Admitting: Family Medicine

## 2022-09-10 ENCOUNTER — Ambulatory Visit (INDEPENDENT_AMBULATORY_CARE_PROVIDER_SITE_OTHER): Payer: Medicare Other | Admitting: Family Medicine

## 2022-09-10 VITALS — BP 163/88 | HR 93 | Resp 18 | Ht 68.0 in | Wt 213.0 lb

## 2022-09-10 DIAGNOSIS — E782 Mixed hyperlipidemia: Secondary | ICD-10-CM | POA: Diagnosis not present

## 2022-09-10 DIAGNOSIS — N182 Chronic kidney disease, stage 2 (mild): Secondary | ICD-10-CM

## 2022-09-10 DIAGNOSIS — I10 Essential (primary) hypertension: Secondary | ICD-10-CM | POA: Diagnosis not present

## 2022-09-10 DIAGNOSIS — E1165 Type 2 diabetes mellitus with hyperglycemia: Secondary | ICD-10-CM | POA: Diagnosis not present

## 2022-09-10 LAB — COMPREHENSIVE METABOLIC PANEL
ALT: 10 U/L (ref 0–53)
AST: 15 U/L (ref 0–37)
Albumin: 3.7 g/dL (ref 3.5–5.2)
Alkaline Phosphatase: 86 U/L (ref 39–117)
BUN: 24 mg/dL — ABNORMAL HIGH (ref 6–23)
CO2: 28 mEq/L (ref 19–32)
Calcium: 8.2 mg/dL — ABNORMAL LOW (ref 8.4–10.5)
Chloride: 103 mEq/L (ref 96–112)
Creatinine, Ser: 2.22 mg/dL — ABNORMAL HIGH (ref 0.40–1.50)
GFR: 33.11 mL/min — ABNORMAL LOW (ref >60.00)
Glucose, Bld: 225 mg/dL — ABNORMAL HIGH (ref 70–99)
Potassium: 4.5 mEq/L (ref 3.5–5.1)
Sodium: 137 mEq/L (ref 135–145)
Total Bilirubin: 0.6 mg/dL (ref 0.2–1.2)
Total Protein: 6.2 g/dL (ref 6.0–8.3)

## 2022-09-10 LAB — CBC
HCT: 32.8 % — ABNORMAL LOW (ref 39.0–52.0)
Hemoglobin: 11.8 g/dL — ABNORMAL LOW (ref 13.0–17.0)
MCHC: 35.9 g/dL (ref 30.0–36.0)
MCV: 88.5 fl (ref 78.0–100.0)
Platelets: 238 10*3/uL (ref 150.0–400.0)
RBC: 3.7 Mil/uL — ABNORMAL LOW (ref 4.22–5.81)
RDW: 13.8 % (ref 11.5–15.5)
WBC: 5.7 10*3/uL (ref 4.0–10.5)

## 2022-09-10 LAB — LIPID PANEL
Cholesterol: 232 mg/dL — ABNORMAL HIGH (ref 0–200)
HDL: 46.5 mg/dL (ref >39.00)
LDL Cholesterol: 167 mg/dL — ABNORMAL HIGH (ref 0–99)
NonHDL: 185.61
Total CHOL/HDL Ratio: 5
Triglycerides: 95 mg/dL (ref 0.0–149.0)
VLDL: 19 mg/dL (ref 0.0–40.0)

## 2022-09-10 LAB — HEMOGLOBIN A1C: Hgb A1c MFr Bld: 8.9 % — ABNORMAL HIGH (ref 4.6–6.5)

## 2022-09-10 MED ORDER — BLOOD GLUCOSE MONITOR KIT: PACK | 0 refills | Status: AC

## 2022-09-10 MED ORDER — FREESTYLE LIBRE 2 SENSOR MISC: 1.0000 | 6 refills | Status: DC

## 2022-09-10 NOTE — Assessment & Plan Note (Signed)
Blood pressure is not at goal for age and co-morbidities.   Recommendations: you need to at least restart the amlodipine, lisinopril for now. If tolerating well and labs are stable at follow-up, we will add back the lasix and then the coreg. - monitor and log blood pressures at home - check around the same time each day in a relaxed setting - Limit salt to <2000 mg/day - Follow DASH eating plan (heart healthy diet) - limit alcohol to 2 standard drinks per day for men and 1 per day for women - avoid tobacco products - get at least 2 hours of regular aerobic exercise weekly Patient aware of signs/symptoms requiring further/urgent evaluation. Labs updated today.

## 2022-09-10 NOTE — Progress Notes (Signed)
Established Patient Office Visit  Subjective   Patient ID: Matthew Fisher, male    DOB: 08/18/1971  Age: 51 y.o. MRN: [id number]  CC: routine f/u   HPI Patient presents for 40-monthfollow-up. He last saw me on 03/12/22 for routine follow-up  He saw cardiology ([facility] Dr. KSharol Harness on 02/28/22 for CAD, HLD. No changes to current plan as stress test was negative he denied any new symptoms at the time. Recommended continue atorvastatin 80 mg daily. He also follows with nephrology for CKD with stable Cr 2-2.5. States he cancelled the last appointment, because he feels like he isn't doing anything for him.    HYPERTENSION: - Medications: amlodipine 10 mg daily, coreg 25 mg BID, lasix 40 mg daily, lisinopril 20 mg daily,  - Compliance: reports he has not been taking any of his meds for the past few weeks, because he felt that they were making him feel poorly (tired, sleeps all day) - Checking BP at home: occasionally, has been high - Denies any SOB, recurrent headaches, CP, vision changes, LE edema, dizziness, palpitations, or medication side effects. - Diet: trying low sodium, low carb  - Exercise: active, walking, yard work   HYPERLIPIDEMIA - medications: Lipitor 80 mg daily, Zetia 10 mg daily - compliance: not taking - medication SEs: no The ASCVD Risk score (Arnett DK, et al., 2019) failed to calculate for the following reasons:   The patient has a prior MI or stroke diagnosis - last checked by cardiology on 02/21/22 and no changes made    DIABETES: - Checking BG at home: no, has not been wearing his CGM  - Medications: Levemir 10 units nightly, Januvia 50 mg daily  - Compliance: not taking - Denies symptoms of hypoglycemia, polyuria, polydipsia, numbness extremities, foot ulcers/trauma, wounds that are not healing, medication side effects  Lab Results  Component Value Date   HGBA1C 9.2 (H) 03/12/2022         ROS All review of systems negative except what is listed in  the HPI    Objective:     BP (!) 163/88   Pulse 93   Resp 18   Ht _0  (1.727 m)   Wt 213 lb (96.6 kg)   SpO2 100%   BMI 32.39 kg/m    Physical Exam Vitals reviewed.  Constitutional:      Appearance: Normal appearance. He is obese.  HENT:     Head: Normocephalic and atraumatic.  Cardiovascular:     Rate and Rhythm: Normal rate and regular rhythm.  Pulmonary:     Effort: Pulmonary effort is normal.     Breath sounds: Normal breath sounds.  Musculoskeletal:     Cervical back: Normal range of motion and neck supple. No tenderness.  Lymphadenopathy:     Cervical: No cervical adenopathy.  Skin:    General: Skin is warm and dry.  Neurological:     General: No focal deficit present.     Mental Status: He is alert and oriented to person, place, and time. Mental status is at baseline.  Psychiatric:        Mood and Affect: Mood normal.        Behavior: Behavior normal.        Thought Content: Thought content normal.        Judgment: Judgment normal.      No results found for any visits on 09/10/22.    The ASCVD Risk score (Arnett DK, et al., 2019) failed to calculate for the  following reasons:   The patient has a prior MI or stroke diagnosis    Assessment & Plan:   Problem List Items Addressed This Visit       Cardiovascular and Mediastinum   Hypertension - Primary (Chronic)    Blood pressure is not at goal for age and co-morbidities.   Recommendations: you need to at least restart the amlodipine, lisinopril for now. If tolerating well and labs are stable at follow-up, we will add back the lasix and then the coreg. - monitor and log blood pressures at home - check around the same time each day in a relaxed setting - Limit salt to <2000 mg/day - Follow DASH eating plan (heart healthy diet) - limit alcohol to 2 standard drinks per day for men and 1 per day for women - avoid tobacco products - get at least 2 hours of regular aerobic exercise weekly Patient  aware of signs/symptoms requiring further/urgent evaluation. Labs updated today.      Relevant Orders   CBC   Comprehensive metabolic panel   Lipid panel     Endocrine   Type 2 diabetes mellitus with hyperglycemia, without long-term current use of insulin (HCC) (Chronic)    Poorly controlled with last A1c 9.2% Restart Levemir 10 units nightly, Januvia 50 mg daily UTD on vaccines, eye exam, foot exam On ACEi  On Statin  Discussed diet and exercise Labs today       Relevant Medications   blood glucose meter kit and supplies KIT   Continuous Blood Gluc Sensor (FREESTYLE LIBRE 2 SENSOR) MISC   Other Relevant Orders   Hemoglobin A1c   CBC   Comprehensive metabolic panel   Lipid panel     Other   Mixed hyperlipidemia (Chronic)    -Reviewed most recent lipid panel -Medication management: restart the Lipitor 80 mg daily, Zetia 10 mg daily -Repeat CMP and lipid panel today -Diet low in saturated fat -Regular exercise - at least 30 minutes, 5 times per week      Relevant Orders   CBC   Comprehensive metabolic panel   Lipid panel   Make sure you have a follow-up appointment scheduled with your cardiologist and nephrologist.   Thorough discussion on the importance of compliance and potential risks of not following treatment plan we have recommended.   Return in about 2 weeks (around 09/24/2022) for med f/u with Lovena Le .    Terrilyn Saver, NP

## 2022-09-10 NOTE — Assessment & Plan Note (Signed)
Poorly controlled with last A1c 9.2% Restart Levemir 10 units nightly, Januvia 50 mg daily UTD on vaccines, eye exam, foot exam On ACEi  On Statin  Discussed diet and exercise Labs today

## 2022-09-10 NOTE — Assessment & Plan Note (Signed)
-  Reviewed most recent lipid panel -Medication management: restart the Lipitor 80 mg daily, Zetia 10 mg daily -Repeat CMP and lipid panel today -Diet low in saturated fat -Regular exercise - at least 30 minutes, 5 times per week 

## 2022-09-10 NOTE — Patient Instructions (Addendum)
Blood pressure is not at goal for age and co-morbidities.   Recommendations: you need to at least restart the amlodipine, lisinopril for now. If tolerating well and labs are stable at follow-up, we will add back the lasix and then the coreg. - monitor and log blood pressures at home - check around the same time each day in a relaxed setting - Limit salt to <2000 mg/day - Follow DASH eating plan (heart healthy diet) - limit alcohol to 2 standard drinks per day for men and 1 per day for women - avoid tobacco products - get at least 2 hours of regular aerobic exercise weekly Patient aware of signs/symptoms requiring further/urgent evaluation. Labs updated today.  Diabetes: Poorly controlled with last A1c 9.2% Restart Levemir 10 units nightly, Januvia 50 mg daily UTD on vaccines, eye exam, foot exam On ACEi  On Statin  Discussed diet and exercise Labs today   High cholesterol: -Reviewed most recent lipid panel -Medication management: restart the Lipitor 80 mg daily, Zetia 10 mg daily -Repeat CMP and lipid panel today -Diet low in saturated fat -Regular exercise - at least 30 minutes, 5 times per week   Make sure you have a follow-up appointment scheduled with your cardiologist and nephrologist.

## 2022-09-11 ENCOUNTER — Other Ambulatory Visit: Payer: Self-pay

## 2022-09-11 ENCOUNTER — Telehealth: Payer: Self-pay

## 2022-09-11 DIAGNOSIS — E782 Mixed hyperlipidemia: Secondary | ICD-10-CM

## 2022-09-11 DIAGNOSIS — E1165 Type 2 diabetes mellitus with hyperglycemia: Secondary | ICD-10-CM

## 2022-09-11 MED ORDER — SITAGLIPTIN PHOSPHATE 50 MG PO TABS: 50.0000 mg | ORAL_TABLET | Freq: Every day | ORAL | 1 refills | Status: DC

## 2022-09-11 MED ORDER — ATORVASTATIN CALCIUM 80 MG PO TABS: 80.0000 mg | ORAL_TABLET | Freq: Every day | ORAL | Status: DC

## 2022-09-11 NOTE — Telephone Encounter (Signed)
PA started for [employer] 2 Sensor Key: [id number] Awaiting response

## 2022-09-12 DIAGNOSIS — Z961 Presence of intraocular lens: Secondary | ICD-10-CM | POA: Diagnosis not present

## 2022-09-12 DIAGNOSIS — H26491 Other secondary cataract, right eye: Secondary | ICD-10-CM | POA: Diagnosis not present

## 2022-09-12 DIAGNOSIS — E113513 Type 2 diabetes mellitus with proliferative diabetic retinopathy with macular edema, bilateral: Secondary | ICD-10-CM | POA: Diagnosis not present

## 2022-09-12 DIAGNOSIS — H35371 Puckering of macula, right eye: Secondary | ICD-10-CM | POA: Diagnosis not present

## 2022-09-12 DIAGNOSIS — Z794 Long term (current) use of insulin: Secondary | ICD-10-CM | POA: Diagnosis not present

## 2022-09-12 DIAGNOSIS — H2512 Age-related nuclear cataract, left eye: Secondary | ICD-10-CM | POA: Diagnosis not present

## 2022-09-12 DIAGNOSIS — H3589 Other specified retinal disorders: Secondary | ICD-10-CM | POA: Diagnosis not present

## 2022-09-12 NOTE — Addendum Note (Signed)
Addended by: Hyman Hopes B on: 09/12/2022 07:57 AM   Modules accepted: Orders

## 2022-09-18 NOTE — Telephone Encounter (Signed)
PA approved.   Request Reference Number: [id number]. [place] KIT 2 SENSOR is approved through 10/08/2023. Your patient may now fill this prescription and it will be covered.

## 2022-09-24 ENCOUNTER — Encounter: Payer: Self-pay | Admitting: Family Medicine

## 2022-09-24 ENCOUNTER — Ambulatory Visit (INDEPENDENT_AMBULATORY_CARE_PROVIDER_SITE_OTHER): Payer: Medicare Other | Admitting: Family Medicine

## 2022-09-24 VITALS — BP 130/80 | HR 86 | Temp 97.7°F | Resp 18 | Ht 68.0 in | Wt 209.2 lb

## 2022-09-24 DIAGNOSIS — E1165 Type 2 diabetes mellitus with hyperglycemia: Secondary | ICD-10-CM

## 2022-09-24 DIAGNOSIS — I10 Essential (primary) hypertension: Secondary | ICD-10-CM | POA: Diagnosis not present

## 2022-09-24 LAB — COMPREHENSIVE METABOLIC PANEL
ALT: 15 U/L (ref 0–53)
AST: 19 U/L (ref 0–37)
Albumin: 3.8 g/dL (ref 3.5–5.2)
Alkaline Phosphatase: 88 U/L (ref 39–117)
BUN: 26 mg/dL — ABNORMAL HIGH (ref 6–23)
CO2: 26 mEq/L (ref 19–32)
Calcium: 8.6 mg/dL (ref 8.4–10.5)
Chloride: 102 mEq/L (ref 96–112)
Creatinine, Ser: 2.37 mg/dL — ABNORMAL HIGH (ref 0.40–1.50)
GFR: 30.61 mL/min — ABNORMAL LOW (ref >60.00)
Glucose, Bld: 233 mg/dL — ABNORMAL HIGH (ref 70–99)
Potassium: 4.5 mEq/L (ref 3.5–5.1)
Sodium: 136 mEq/L (ref 135–145)
Total Bilirubin: 0.5 mg/dL (ref 0.2–1.2)
Total Protein: 6.4 g/dL (ref 6.0–8.3)

## 2022-09-24 MED ORDER — SITAGLIPTIN PHOSPHATE 50 MG PO TABS
50.0000 mg | ORAL_TABLET | Freq: Every day | ORAL | 3 refills | Status: DC
Start: 2022-09-24 — End: 2022-12-13

## 2022-09-24 NOTE — Assessment & Plan Note (Signed)
Check Januvia website for coupon. Changing prescription to only 30-day supply to see if that will be better covered by insurance.  In the meantime, increase your bedtime insulin to 14 units nightly. Keep a log of fasting glucose levels and increase insulin by 2 units ever 4 nights if fasting glucose levels are above 130 consistently.

## 2022-09-24 NOTE — Assessment & Plan Note (Signed)
Restart coreg. Continue monitoring your blood pressure at home. Make sure you have an upcoming cardiology and nephrology follow-up scheduled.

## 2022-09-24 NOTE — Patient Instructions (Addendum)
Restart coreg. Continue monitoring your blood pressure at home. Make sure you have an upcoming cardiology and nephrology follow-up scheduled.   Check Januvia website for coupon. Changing prescription to only 30-day supply to see if that will be better covered by insurance.  In the meantime, increase your bedtime insulin to 14 units nightly. Keep a log of fasting glucose levels and increase insulin by 2 units ever 4 nights if fasting glucose levels are above 130 consistently.

## 2022-09-24 NOTE — Progress Notes (Signed)
Established Patient Office Visit  Subjective   Patient ID: Matthew Fisher, male    DOB: 01-12-1971  Age: 51 y.o. MRN: [id number]  Chief Complaint  Patient presents with   Follow-up    HPI  Patient was last seen on 09/10/22 and reported that he stopped taking all of his medications because he felt like they were making him feel poorly. We had a thorough discussion on med compliance. Labs revealed high A1c, low calcium, high cholesterol. He was instructed to follow-up his with cardiologist and nephrologist regularly.   Reports he restarted all of the medications we discussed last time (agreed to pause on the coreg and lasix to see how he was feeling on the others first). He did not restart the Januvia because it is expensive. Reports he has felt normal since he last saw me.  He has not been checking his fasting glucose at home. Patient denies any chest pain, palpitations, dyspnea, wheezing, edema, recurrent headaches, vision changes.        ROS All review of systems negative except what is listed in the HPI      Objective:     BP 130/80   Pulse 86   Temp 97.7 F (36.5 C) (Oral)   Resp 18   Ht _0  (1.727 m)   Wt 209 lb 3.2 oz (94.9 kg)   SpO2 100%   BMI 31.81 kg/m    Physical Exam Vitals reviewed.  Constitutional:      Appearance: Normal appearance.  Cardiovascular:     Rate and Rhythm: Normal rate and regular rhythm.     Pulses: Normal pulses.     Heart sounds: Normal heart sounds.  Pulmonary:     Effort: Pulmonary effort is normal.     Breath sounds: Normal breath sounds.  Musculoskeletal:     Right lower leg: No edema.     Left lower leg: No edema.  Skin:    General: Skin is warm and dry.  Neurological:     Mental Status: He is alert and oriented to person, place, and time.  Psychiatric:        Mood and Affect: Mood normal.        Behavior: Behavior normal.        Thought Content: Thought content normal.        Judgment: Judgment normal.         No results found for any visits on 09/24/22.    The ASCVD Risk score (Arnett DK, et al., 2019) failed to calculate for the following reasons:   The patient has a prior MI or stroke diagnosis    Assessment & Plan:   Problem List Items Addressed This Visit       Cardiovascular and Mediastinum   Hypertension - Primary (Chronic)    Restart coreg. Continue monitoring your blood pressure at home. Make sure you have an upcoming cardiology and nephrology follow-up scheduled.       Relevant Orders   Comp Met (CMET)     Endocrine   Type 2 diabetes mellitus with hyperglycemia, without long-term current use of insulin ([place]) (Chronic)    Check Januvia website for coupon. Changing prescription to only 30-day supply to see if that will be better covered by insurance.  In the meantime, increase your bedtime insulin to 14 units nightly. Keep a log of fasting glucose levels and increase insulin by 2 units ever 4 nights if fasting glucose levels are above 130 consistently.  Relevant Medications   sitaGLIPtin (JANUVIA) 50 MG tablet   Other Relevant Orders   Comp Met (CMET)   Other Visit Diagnoses     Hypocalcemia       Relevant Orders   Comp Met (CMET)       Return in about 6 weeks (around 11/05/2022) for routine follow-up.    Terrilyn Saver, NP

## 2022-09-26 DIAGNOSIS — H35372 Puckering of macula, left eye: Secondary | ICD-10-CM | POA: Diagnosis not present

## 2022-09-26 DIAGNOSIS — H3589 Other specified retinal disorders: Secondary | ICD-10-CM | POA: Diagnosis not present

## 2022-09-26 DIAGNOSIS — E113513 Type 2 diabetes mellitus with proliferative diabetic retinopathy with macular edema, bilateral: Secondary | ICD-10-CM | POA: Diagnosis not present

## 2022-09-26 DIAGNOSIS — Z961 Presence of intraocular lens: Secondary | ICD-10-CM | POA: Diagnosis not present

## 2022-09-26 DIAGNOSIS — Z794 Long term (current) use of insulin: Secondary | ICD-10-CM | POA: Diagnosis not present

## 2022-09-26 DIAGNOSIS — H2512 Age-related nuclear cataract, left eye: Secondary | ICD-10-CM | POA: Diagnosis not present

## 2022-10-03 DIAGNOSIS — E113513 Type 2 diabetes mellitus with proliferative diabetic retinopathy with macular edema, bilateral: Secondary | ICD-10-CM | POA: Diagnosis not present

## 2022-10-03 DIAGNOSIS — H2512 Age-related nuclear cataract, left eye: Secondary | ICD-10-CM | POA: Diagnosis not present

## 2022-10-03 DIAGNOSIS — H35371 Puckering of macula, right eye: Secondary | ICD-10-CM | POA: Diagnosis not present

## 2022-10-03 DIAGNOSIS — Z794 Long term (current) use of insulin: Secondary | ICD-10-CM | POA: Diagnosis not present

## 2022-10-03 DIAGNOSIS — Z961 Presence of intraocular lens: Secondary | ICD-10-CM | POA: Diagnosis not present

## 2022-10-03 DIAGNOSIS — H3589 Other specified retinal disorders: Secondary | ICD-10-CM | POA: Diagnosis not present

## 2022-10-11 NOTE — Progress Notes (Deleted)
Office Visit Note  Patient: Matthew Fisher             Date of Birth: 18-Feb-1971           MRN: [id number]             PCP: Terrilyn Saver, NP Referring: Terrilyn Saver, NP Visit Date: 10/19/2022 Occupation: _0 @  Subjective:  No chief complaint on file.   History of Present Illness: Matthew Fisher is a 52 y.o. male ***     Activities of Daily Living:  Patient reports morning stiffness for *** {minute/hour:19697}.   Patient {ACTIONS;DENIES/REPORTS:21021675::"Denies"} nocturnal pain.  Difficulty dressing/grooming: {ACTIONS;DENIES/REPORTS:21021675::"Denies"} Difficulty climbing stairs: {ACTIONS;DENIES/REPORTS:21021675::"Denies"} Difficulty getting out of chair: {ACTIONS;DENIES/REPORTS:21021675::"Denies"} Difficulty using hands for taps, buttons, cutlery, and/or writing: {ACTIONS;DENIES/REPORTS:21021675::"Denies"}  No Rheumatology ROS completed.   PMFS History:  Patient Active Problem List   Diagnosis Date Noted   Primary osteoarthritis of both knees 01/09/2022   Gout 12/21/2021   GERD (gastroesophageal reflux disease) 12/21/2021   Arthritis 12/21/2021   Gastroesophageal reflux disease with esophagitis 12/04/2021   Chronic gout of knee 12/04/2021   Mixed hyperlipidemia 12/04/2021   Iron deficiency 12/04/2021   Acute gout due to renal impairment involving left knee 09/28/2021   Synovitis of right knee 09/28/2021   CHF (congestive heart failure) ([place]) 09/11/2019   Nonadherence to medical treatment    Tobacco abuse disorder    CAD (coronary artery disease) 10/18/2017   CKD (chronic kidney disease), stage II 10/18/2017   NSTEMI (non-ST elevated myocardial infarction) ([place]) 10/15/2017   Type 2 diabetes mellitus with hyperglycemia, without long-term current use of insulin ([place]) 10/15/2017   Hypertension 10/15/2017   Gastroparesis due to secondary diabetes ([place]) 10/15/2017   Hypertensive emergency, no CHF     Past Medical History:  Diagnosis Date   Arthritis     CAD (coronary artery disease)    a. NSTEMI 10/2017 s/p multivessel PCI of the OM1, PDA, PLA ostium and mid PLA.   CKD (chronic kidney disease), stage II    GERD (gastroesophageal reflux disease)    Gout    Hypertension    Insulin dependent diabetes mellitus     Family History  Problem Relation Age of Onset   Diabetes Mother    Cancer Father    Healthy Daughter    Healthy Daughter    Healthy Son    Past Surgical History:  Procedure Laterality Date   CORONARY STENT INTERVENTION N/A 10/17/2017   Procedure: CORONARY STENT INTERVENTION;  Surgeon: Jettie Booze, MD;  Location: [facility];  Service: Cardiovascular;  Laterality: N/A;   EYE SURGERY     FRACTURE SURGERY     right ring finger   KNEE ARTHROSCOPY WITH LATERAL MENISECTOMY Left 01/22/2013   Procedure: LEFT KNEE ARTHROSCOPY DEBRIDEMENT CHONDROPLASTY, LATERAL RELEASE AND partial medial meniscectomy;  Surgeon: Johnn Hai, MD;  Location: [facility];  Service: Orthopedics;  Laterality: Left;   KNEE SURGERY     "removed bone"   LEFT HEART CATH N/A 10/17/2017   Procedure: Left Heart Cath;  Surgeon: Jettie Booze, MD;  Location: [facility];  Service: Cardiovascular;  Laterality: N/A;   LEFT HEART CATH AND CORONARY ANGIOGRAPHY N/A 10/15/2017   Procedure: LEFT HEART CATH AND CORONARY ANGIOGRAPHY;  Surgeon: Leonie Man, MD;  Location: [facility];  Service: Cardiovascular;  Laterality: N/A;   SHOULDER SURGERY Bilateral    Social History   Social History Narrative   Not on file  Immunization History  Administered Date(s) Administered   PFIZER(Purple Top)SARS-COV-2 Vaccination 04/19/2020, 05/10/2020   Tdap 12/12/2004, 05/09/2011     Objective: Vital Signs: There were no vitals taken for this visit.   Physical Exam   Musculoskeletal Exam: ***  CDAI Exam: CDAI Score: -- Patient Global: --; Provider Global: -- Swollen: --; Tender: -- Joint Exam 10/19/2022   No joint exam has been  documented for this visit   There is currently no information documented on the homunculus. Go to the Rheumatology activity and complete the homunculus joint exam.  Investigation: No additional findings.  Imaging: No results found.  Recent Labs: Lab Results  Component Value Date   WBC 5.7 09/10/2022   HGB 11.8 (L) 09/10/2022   PLT 238.0 09/10/2022   NA 136 09/24/2022   K 4.5 09/24/2022   CL 102 09/24/2022   CO2 26 09/24/2022   GLUCOSE 233 (H) 09/24/2022   BUN 26 (H) 09/24/2022   CREATININE 2.37 (H) 09/24/2022   BILITOT 0.5 09/24/2022   ALKPHOS 88 09/24/2022   AST 19 09/24/2022   ALT 15 09/24/2022   PROT 6.4 09/24/2022   ALBUMIN 3.8 09/24/2022   CALCIUM 8.6 09/24/2022   GFRAA 42 (L) 09/12/2019   July 26, 2022 ESR 29, uric acid 7.9   09/25/21: uric acid 6.6, synovial analysis-visible clumping present.  no crystals found.  WBC 5,125, ANA negative, RF<10, Anti-CCP<1, ESR 82, CRP 26   Speciality Comments: No specialty comments available.  Procedures:  No procedures performed Allergies: Patient has no known allergies.   Assessment / Plan:     Visit Diagnoses: No diagnosis found.  Orders: No orders of the defined types were placed in this encounter.  No orders of the defined types were placed in this encounter.   Face-to-face time spent with patient was *** minutes. Greater than 50% of time was spent in counseling and coordination of care.  Follow-Up Instructions: No follow-ups on file.   Bo Merino, MD  Note - This record has been created using Editor, commissioning.  Chart creation errors have been sought, but may not always  have been located. Such creation errors do not reflect on  the standard of medical care.

## 2022-10-15 NOTE — Progress Notes (Unsigned)
Office Visit Note  Patient: Matthew Fisher             Date of Birth: 08-Aug-1971           MRN: [id number]             PCP: Terrilyn Saver, NP Referring: Terrilyn Saver, NP Visit Date: 10/16/2022 Occupation: @GUAROCC @  Subjective:  Pain and stiffness in joints  History of present Illness: Kenly Xiao is a 52 y.o. male with history of gouty arthropathy and osteoarthritis.  He states he has not had a gout flare since the last visit.  He has been taking allopurinol 200 mg p.o. daily.  He does not recall increasing the dose of allopurinol.  He continues to have pain and stiffness in bilateral hands, bilateral knee joints in his feet.  He has not noticed any increased joint swelling.    Activities of Daily Living:  Patient reports morning stiffness for 30 minutes.   Patient Reports nocturnal pain.  Difficulty dressing/grooming: Denies Difficulty climbing stairs: Reports Difficulty getting out of chair: Reports Difficulty using hands for taps, buttons, cutlery, and/or writing: Reports  Review of Systems  Constitutional:  Negative for fatigue.  HENT:  Negative for mouth sores and mouth dryness.   Eyes:  Negative for dryness.  Respiratory:  Positive for shortness of breath.   Cardiovascular:  Negative for chest pain and palpitations.  Gastrointestinal:  Negative for blood in stool, constipation and diarrhea.  Endocrine: Negative for increased urination.  Genitourinary:  Positive for urgency. Negative for involuntary urination.  Musculoskeletal:  Positive for joint pain, joint pain and morning stiffness. Negative for gait problem, joint swelling, myalgias, muscle weakness, muscle tenderness and myalgias.  Skin:  Negative for color change, rash, hair loss and sensitivity to sunlight.  Allergic/Immunologic: Negative for susceptible to infections.  Neurological:  Positive for dizziness. Negative for headaches.  Hematological:  Negative for swollen glands.  Psychiatric/Behavioral:   Negative for depressed mood and sleep disturbance. The patient is not nervous/anxious.     PMFS History:  Patient Active Problem List   Diagnosis Date Noted   Primary osteoarthritis of both knees 01/09/2022   Gout 12/21/2021   GERD (gastroesophageal reflux disease) 12/21/2021   Arthritis 12/21/2021   Gastroesophageal reflux disease with esophagitis 12/04/2021   Chronic gout of knee 12/04/2021   Mixed hyperlipidemia 12/04/2021   Iron deficiency 12/04/2021   Acute gout due to renal impairment involving left knee 09/28/2021   Synovitis of right knee 09/28/2021   CHF (congestive heart failure) ([place]) 09/11/2019   Nonadherence to medical treatment    Tobacco abuse disorder    CAD (coronary artery disease) 10/18/2017   CKD (chronic kidney disease), stage II 10/18/2017   NSTEMI (non-ST elevated myocardial infarction) ([place]) 10/15/2017   Type 2 diabetes mellitus with hyperglycemia, without long-term current use of insulin ([place]) 10/15/2017   Hypertension 10/15/2017   Gastroparesis due to secondary diabetes ([place]) 10/15/2017   Hypertensive emergency, no CHF     Past Medical History:  Diagnosis Date   Arthritis    CAD (coronary artery disease)    a. NSTEMI 10/2017 s/p multivessel PCI of the OM1, PDA, PLA ostium and mid PLA.   CKD (chronic kidney disease), stage II    GERD (gastroesophageal reflux disease)    Gout    Hypertension    Insulin dependent diabetes mellitus     Family History  Problem Relation Age of Onset   Diabetes Mother    Cancer Father  Healthy Daughter    Healthy Daughter    Healthy Son    Past Surgical History:  Procedure Laterality Date   CORONARY STENT INTERVENTION N/A 10/17/2017   Procedure: CORONARY STENT INTERVENTION;  Surgeon: Jettie Booze, MD;  Location: [facility];  Service: Cardiovascular;  Laterality: N/A;   EYE SURGERY     EYE SURGERY Right 08/2022   laser surgery   FRACTURE SURGERY     right ring finger   KNEE ARTHROSCOPY WITH  LATERAL MENISECTOMY Left 01/22/2013   Procedure: LEFT KNEE ARTHROSCOPY DEBRIDEMENT CHONDROPLASTY, LATERAL RELEASE AND partial medial meniscectomy;  Surgeon: Johnn Hai, MD;  Location: [facility];  Service: Orthopedics;  Laterality: Left;   KNEE SURGERY     "removed bone"   LEFT HEART CATH N/A 10/17/2017   Procedure: Left Heart Cath;  Surgeon: Jettie Booze, MD;  Location: [facility];  Service: Cardiovascular;  Laterality: N/A;   LEFT HEART CATH AND CORONARY ANGIOGRAPHY N/A 10/15/2017   Procedure: LEFT HEART CATH AND CORONARY ANGIOGRAPHY;  Surgeon: Leonie Man, MD;  Location: [facility];  Service: Cardiovascular;  Laterality: N/A;   SHOULDER SURGERY Bilateral    Social History   Social History Narrative   Not on file   Immunization History  Administered Date(s) Administered   PFIZER(Purple Top)SARS-COV-2 Vaccination 04/19/2020, 05/10/2020   Tdap 12/12/2004, 05/09/2011     Objective: Vital Signs: BP 132/80 (BP Location: Right Arm, Patient Position: Sitting, Cuff Size: Normal)   Pulse 84   Resp 18   Ht 5\' 8"  (1.727 m)   Wt 210 lb 12.8 oz (95.6 kg)   BMI 32.05 kg/m    Physical Exam Vitals and nursing note reviewed.  Constitutional:      Appearance: He is well-developed.  HENT:     Head: Normocephalic and atraumatic.  Eyes:     Conjunctiva/sclera: Conjunctivae normal.     Pupils: Pupils are equal, round, and reactive to light.  Cardiovascular:     Rate and Rhythm: Normal rate and regular rhythm.     Heart sounds: Normal heart sounds.  Pulmonary:     Effort: Pulmonary effort is normal.     Breath sounds: Normal breath sounds.  Abdominal:     General: Bowel sounds are normal.     Palpations: Abdomen is soft.  Musculoskeletal:     Cervical back: Normal range of motion and neck supple.  Skin:    General: Skin is warm and dry.     Capillary Refill: Capillary refill takes less than 2 seconds.  Neurological:     Mental Status: He is alert and  oriented to person, place, and time.  Psychiatric:        Behavior: Behavior normal.      Musculoskeletal Exam: Cervical spine was in good range of motion.  He had good range of motion of bilateral shoulders.  Elbow joints had contractures bilaterally with tophi over both elbows.  He had limited extension of his wrist joints.  Bilateral PIP and DIP thickening with limited extension of the PIP joint was noted.  Hip joints were in good range of motion.  Knee joints were in good range of motion with some limitation on extension.  No warmth swelling or effusion was noted.  He had limited range of motion of his ankle joints.  There was no tenderness over ankles or MTPs.  CDAI Exam: CDAI Score: -- Patient Global: --; Provider Global: -- Swollen: --; Tender: -- Joint Exam  10/16/2022   No joint exam has been documented for this visit   There is currently no information documented on the homunculus. Go to the Rheumatology activity and complete the homunculus joint exam.  Investigation: No additional findings.  Imaging: No results found.  Recent Labs: Lab Results  Component Value Date   WBC 5.7 09/10/2022   HGB 11.8 (L) 09/10/2022   PLT 238.0 09/10/2022   NA 136 09/24/2022   K 4.5 09/24/2022   CL 102 09/24/2022   CO2 26 09/24/2022   GLUCOSE 233 (H) 09/24/2022   BUN 26 (H) 09/24/2022   CREATININE 2.37 (H) 09/24/2022   BILITOT 0.5 09/24/2022   ALKPHOS 88 09/24/2022   AST 19 09/24/2022   ALT 15 09/24/2022   PROT 6.4 09/24/2022   ALBUMIN 3.8 09/24/2022   CALCIUM 8.6 09/24/2022   GFRAA 42 (L) 09/12/2019   09/25/21: uric acid 6.6, synovial analysis-visible clumping present. no crystals found. WBC 5,125, ANA negative, RF<10, Anti-CCP<1, ESR 82, CRP 26 July 26, 2022 uric acid 7.9, ESR 29  Speciality Comments: No specialty comments available.  Procedures:  No procedures performed Allergies: Patient has no known allergies.   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 Allopurinol 100mg  po qd x 5years. Allopurinol increased to 300 mg po qd at the last visit.  Patient has been taking allopurinol 200 mg p.o. daily.  He does not recall increasing the dose of allopurinol.  Will send a prescription for allopurinol 300 mg p.o. daily.  He states he has not had a gout flare since the last visit.  I will check uric acid prior to his next visit in 2 months.  The goal is to keep uric acid around 4.0 or below.- Plan: Uric acid  Medication monitoring encounter -Labs from September 24, 2022 showed creatinine of 2.37.  AST and ALT were normal.  CBC from September 10, 2022 showed hemoglobin of 11.8.  Plan: CBC with Differential/Platelet, COMPLETE METABOLIC PANEL WITH GFR prior to the next visit.  Pain in both hands -he continues to have pain and discomfort in his bilateral hands.  No synovitis or swelling was noted today.  Bilateral PIP contractures were noted.  X-ray showed changes consistent with osteoarthritis and gouty arthropathy.  X-ray findings were reviewed with the patient.  Joint protection muscle strengthening was discussed.  Contracture of joint of both elbows - Tophi noted over bilateral elbows.  He would need uric acid less than 4.0252 that resolve.  Primary osteoarthritis of both knees -he continues to have pain and discomfort in his knee joints.  He is followed by orthopedics.  History of multiple surgeries on bilateral knee joints.  Warmth on palpation of the left knee joint was noted.  According to the patient LTKR advised.  Pain in both feet -he complains of pain and discomfort in his feet.  He has limited range of motion of his ankle joints.  No warmth swelling or inflammation was noted.  X-rays consistent with osteoarthritis and gouty arthropathy.  CKD (chronic kidney disease), stage II - [facility]  Other medical problems are listed as follows:  Hypertensive  emergency, no CHF-blood pressure was elevated at 158/89.  Repeat blood pressure was elevated.  He was advised to monitor blood pressure closely and follow-up with his PCP.  Coronary artery disease involving native coronary artery of native heart without angina pectoris  NSTEMI (non-ST elevated myocardial infarction) (  [place])  Type 2 diabetes mellitus with hyperglycemia, without long-term current use of insulin ([facility])  Mixed hyperlipidemia  Gastroparesis due to secondary diabetes ([place])  History of gastroesophageal reflux (GERD)  Iron deficiency  Tobacco abuse disorder   Orders: Orders Placed This Encounter  Procedures   CBC with Differential/Platelet   COMPLETE METABOLIC PANEL WITH GFR   Uric acid   Meds ordered this encounter  Medications   allopurinol (ZYLOPRIM) 300 MG tablet    Sig: Take 1 tablet (300 mg total) by mouth daily.    Dispense:  30 tablet    Refill:  2   .  Follow-Up Instructions: Return in about 2 months (around 12/15/2022) for Gout.   Bo Merino, MD  Note - This record has been created using Editor, commissioning.  Chart creation errors have been sought, but may not always  have been located. Such creation errors do not reflect on  the standard of medical care.

## 2022-10-16 ENCOUNTER — Encounter: Payer: Self-pay | Admitting: Rheumatology

## 2022-10-16 ENCOUNTER — Ambulatory Visit: Payer: Medicare Other | Attending: Rheumatology | Admitting: Rheumatology

## 2022-10-16 VITALS — BP 132/80 | HR 84 | Resp 18 | Ht 68.0 in | Wt 210.8 lb

## 2022-10-16 DIAGNOSIS — M170 Bilateral primary osteoarthritis of knee: Secondary | ICD-10-CM

## 2022-10-16 DIAGNOSIS — E1343 Other specified diabetes mellitus with diabetic autonomic (poly)neuropathy: Secondary | ICD-10-CM

## 2022-10-16 DIAGNOSIS — Z5181 Encounter for therapeutic drug level monitoring: Secondary | ICD-10-CM

## 2022-10-16 DIAGNOSIS — M79671 Pain in right foot: Secondary | ICD-10-CM | POA: Diagnosis not present

## 2022-10-16 DIAGNOSIS — M1A09X1 Idiopathic chronic gout, multiple sites, with tophus (tophi): Secondary | ICD-10-CM | POA: Diagnosis not present

## 2022-10-16 DIAGNOSIS — N182 Chronic kidney disease, stage 2 (mild): Secondary | ICD-10-CM

## 2022-10-16 DIAGNOSIS — M79641 Pain in right hand: Secondary | ICD-10-CM

## 2022-10-16 DIAGNOSIS — I2510 Atherosclerotic heart disease of native coronary artery without angina pectoris: Secondary | ICD-10-CM

## 2022-10-16 DIAGNOSIS — M79642 Pain in left hand: Secondary | ICD-10-CM

## 2022-10-16 DIAGNOSIS — Z8719 Personal history of other diseases of the digestive system: Secondary | ICD-10-CM

## 2022-10-16 DIAGNOSIS — E1165 Type 2 diabetes mellitus with hyperglycemia: Secondary | ICD-10-CM | POA: Diagnosis not present

## 2022-10-16 DIAGNOSIS — M24521 Contracture, right elbow: Secondary | ICD-10-CM | POA: Diagnosis not present

## 2022-10-16 DIAGNOSIS — E611 Iron deficiency: Secondary | ICD-10-CM

## 2022-10-16 DIAGNOSIS — I161 Hypertensive emergency: Secondary | ICD-10-CM | POA: Diagnosis not present

## 2022-10-16 DIAGNOSIS — E782 Mixed hyperlipidemia: Secondary | ICD-10-CM | POA: Diagnosis not present

## 2022-10-16 DIAGNOSIS — M24522 Contracture, left elbow: Secondary | ICD-10-CM

## 2022-10-16 DIAGNOSIS — I214 Non-ST elevation (NSTEMI) myocardial infarction: Secondary | ICD-10-CM | POA: Diagnosis not present

## 2022-10-16 DIAGNOSIS — M79672 Pain in left foot: Secondary | ICD-10-CM

## 2022-10-16 DIAGNOSIS — Z720 Tobacco use: Secondary | ICD-10-CM

## 2022-10-16 MED ORDER — ALLOPURINOL 300 MG PO TABS: 300.0000 mg | ORAL_TABLET | Freq: Every day | ORAL | 2 refills | Status: DC

## 2022-10-16 NOTE — Patient Instructions (Signed)
Please change the dose of allopurinol to 300 mg, 1 tablet by mouth daily.  Please get labs 1 week prior to your next appointment.

## 2022-10-19 ENCOUNTER — Ambulatory Visit: Payer: Medicare Other | Admitting: Rheumatology

## 2022-10-19 DIAGNOSIS — M24521 Contracture, right elbow: Secondary | ICD-10-CM

## 2022-10-19 DIAGNOSIS — I2510 Atherosclerotic heart disease of native coronary artery without angina pectoris: Secondary | ICD-10-CM

## 2022-10-19 DIAGNOSIS — M19072 Primary osteoarthritis, left ankle and foot: Secondary | ICD-10-CM

## 2022-10-19 DIAGNOSIS — Z8719 Personal history of other diseases of the digestive system: Secondary | ICD-10-CM

## 2022-10-19 DIAGNOSIS — E1343 Other specified diabetes mellitus with diabetic autonomic (poly)neuropathy: Secondary | ICD-10-CM

## 2022-10-19 DIAGNOSIS — E782 Mixed hyperlipidemia: Secondary | ICD-10-CM

## 2022-10-19 DIAGNOSIS — I161 Hypertensive emergency: Secondary | ICD-10-CM

## 2022-10-19 DIAGNOSIS — I214 Non-ST elevation (NSTEMI) myocardial infarction: Secondary | ICD-10-CM

## 2022-10-19 DIAGNOSIS — E611 Iron deficiency: Secondary | ICD-10-CM

## 2022-10-19 DIAGNOSIS — E1165 Type 2 diabetes mellitus with hyperglycemia: Secondary | ICD-10-CM

## 2022-10-19 DIAGNOSIS — M170 Bilateral primary osteoarthritis of knee: Secondary | ICD-10-CM

## 2022-10-19 DIAGNOSIS — M1A09X1 Idiopathic chronic gout, multiple sites, with tophus (tophi): Secondary | ICD-10-CM

## 2022-10-19 DIAGNOSIS — Z720 Tobacco use: Secondary | ICD-10-CM

## 2022-10-19 DIAGNOSIS — N182 Chronic kidney disease, stage 2 (mild): Secondary | ICD-10-CM

## 2022-10-19 DIAGNOSIS — M19042 Primary osteoarthritis, left hand: Secondary | ICD-10-CM

## 2022-10-26 DIAGNOSIS — M109 Gout, unspecified: Secondary | ICD-10-CM | POA: Diagnosis not present

## 2022-10-26 DIAGNOSIS — I129 Hypertensive chronic kidney disease with stage 1 through stage 4 chronic kidney disease, or unspecified chronic kidney disease: Secondary | ICD-10-CM | POA: Diagnosis not present

## 2022-10-26 DIAGNOSIS — E11649 Type 2 diabetes mellitus with hypoglycemia without coma: Secondary | ICD-10-CM | POA: Diagnosis not present

## 2022-10-26 DIAGNOSIS — N184 Chronic kidney disease, stage 4 (severe): Secondary | ICD-10-CM | POA: Diagnosis not present

## 2022-10-27 LAB — LAB REPORT - SCANNED
Creatinine, POC: 77.6 mg/dL
Microalb Creat Ratio: 2148

## 2022-11-05 ENCOUNTER — Encounter: Payer: Self-pay | Admitting: Family Medicine

## 2022-11-05 ENCOUNTER — Ambulatory Visit (INDEPENDENT_AMBULATORY_CARE_PROVIDER_SITE_OTHER): Payer: Medicare Other | Admitting: Family Medicine

## 2022-11-05 VITALS — BP 154/84 | HR 87 | Temp 98.4°F | Resp 16 | Ht 68.0 in | Wt 213.4 lb

## 2022-11-05 DIAGNOSIS — I10 Essential (primary) hypertension: Secondary | ICD-10-CM

## 2022-11-05 DIAGNOSIS — E1165 Type 2 diabetes mellitus with hyperglycemia: Secondary | ICD-10-CM

## 2022-11-05 MED ORDER — LISINOPRIL 20 MG PO TABS: 30.0000 mg | ORAL_TABLET | Freq: Every day | ORAL | 3 refills | Status: DC

## 2022-11-05 NOTE — Progress Notes (Signed)
Established Patient Office Visit  Subjective   Patient ID: Matthew Fisher, male    DOB: July 23, 1971  Age: 52 y.o. MRN: [id number]  Chief Complaint  Patient presents with   Follow-up    HPI  Patient is here for 6-week f/u. He had stopped taking all meds as of our follow-up on 09/10/22 because he thought they were making him feel bad. At last appointment on 09/24/22 he reported he had restarted everything except Januvia (cost) and coreg and lasix. We agreed to restart the Coreg and had him check for Januvia coupon. In the meantime he was encouraged to increase insulin to 14 units and titrate up based on fasting glucose levels. Reports he has been feeling much better lately, but never started the Januvia and did not increase the lasix. States his kidney doctor gave him another medicine for diabetes but he cannot recall what it is.    Diabetes: - Checking BG at home: yes; avg 185 - Medications: Januvia (has not started); Levemir - reports he is doing 10 units of insulin nightly (but admits he forgets sometimes) - Compliance: fair - Diet: fair - Exercise: active lifestyle - Eye exam: UTD - Foot exam: UTD - Microalbumin: deferred - Denies symptoms of hypoglycemia, polyuria, polydipsia, numbness extremities, foot ulcers/trauma, wounds that are not healing, medication side effects  - State he has not restarted Januvia    Hypertension: - Medications: amlodipine 10 mg daily, coreg 25 mg BID, lisinopril 20 mg daily - Compliance: good; does not want to restart lasix (no signs of fluid overload) - Checking BP at home: occasionally, 150/88 last check (2-3 days ago) - Denies any SOB, recurrent headaches, CP, vision changes, LE edema, dizziness, palpitations, or medication side effects.       ROS All review of systems negative except what is listed in the HPI    Objective:     BP (!) 154/84   Pulse 87   Temp 98.4 F (36.9 C)   Resp 16   Ht 5\' 8"  (1.727 m)   Wt 213 lb 6.4 oz  (96.8 kg)   SpO2 100%   BMI 32.45 kg/m    Physical Exam Vitals reviewed.  Constitutional:      Appearance: Normal appearance.  Cardiovascular:     Rate and Rhythm: Normal rate and regular rhythm.     Pulses: Normal pulses.     Heart sounds: Normal heart sounds.  Pulmonary:     Effort: Pulmonary effort is normal.     Breath sounds: Normal breath sounds.  Musculoskeletal:     Right lower leg: No edema.     Left lower leg: No edema.  Skin:    General: Skin is warm and dry.  Neurological:     Mental Status: He is alert and oriented to person, place, and time.  Psychiatric:        Mood and Affect: Mood normal.        Behavior: Behavior normal.        Thought Content: Thought content normal.        Judgment: Judgment normal.      No results found for any visits on 11/05/22.    The ASCVD Risk score (Arnett DK, et al., 2019) failed to calculate for the following reasons:   The patient has a prior MI or stroke diagnosis    Assessment & Plan:   Problem List Items Addressed This Visit       Cardiovascular and Mediastinum   Hypertension (Chronic)  Blood pressure is not at goal for age and co-morbidities.   Recommendations: continue amlodipine 10 mg daily, coreg 25 mg BID, increase lisinopril to 30 mg daily - BP goal <130/80 - monitor and log blood pressures at home - check around the same time each day in a relaxed setting - Limit salt to <2000 mg/day - Follow DASH eating plan (heart healthy diet) - limit alcohol to 2 standard drinks per day for men and 1 per day for women - avoid tobacco products - get at least 2 hours of regular aerobic exercise weekly Patient aware of signs/symptoms requiring further/urgent evaluation. Follow-up in 2 weeks  Recommend scheduling cardiology follow-up as well       Relevant Medications   lisinopril (ZESTRIL) 20 MG tablet     Endocrine   Type 2 diabetes mellitus with hyperglycemia, without long-term current use of insulin  ([facility]) - Primary (Chronic)    Patient to check on the Januvia and let us know if he has trouble getting it. Adding referral to Pharmacy for medication assistance.  He will call us when he gets back home to let us know what medicine the nephrologist started him on - no records available yet. Possibly Jardiance, but he is unsure.  Increase Levemir to 14 units nightly and titrate up by 2 units every 3-4 days if fasting glucose >130 consistently.  Reinforced lifestyle measures. Limited options due to GFR of 30 mL/min If not making progress at next A1c check, consider endocrinology referral      Relevant Medications   lisinopril (ZESTRIL) 20 MG tablet    Return in about 2 weeks (around 11/19/2022) for DM/HTN follow-up .    Terrilyn Saver, NP

## 2022-11-05 NOTE — Patient Instructions (Addendum)
Increase nighttime insulin to 14 units. Make sure you are taking every night. Keep checking glucose levels at home.  Check the name and dosage of the medicine the kidney doctor gave you and let us know as soon as possible.  Increase lisinopril to 1.5 tablets daily (30 mg). Follow-up in 2 weeks for recheck and repeat labs (BMP).

## 2022-11-05 NOTE — Assessment & Plan Note (Signed)
Blood pressure is not at goal for age and co-morbidities.   Recommendations: continue amlodipine 10 mg daily, coreg 25 mg BID, increase lisinopril to 30 mg daily - BP goal <130/80 - monitor and log blood pressures at home - check around the same time each day in a relaxed setting - Limit salt to <2000 mg/day - Follow DASH eating plan (heart healthy diet) - limit alcohol to 2 standard drinks per day for men and 1 per day for women - avoid tobacco products - get at least 2 hours of regular aerobic exercise weekly Patient aware of signs/symptoms requiring further/urgent evaluation. Follow-up in 2 weeks  Recommend scheduling cardiology follow-up as well

## 2022-11-05 NOTE — Assessment & Plan Note (Signed)
Patient to check on the Januvia and let us know if he has trouble getting it. Adding referral to Pharmacy for medication assistance.  He will call us when he gets back home to let us know what medicine the nephrologist started him on - no records available yet. Possibly Jardiance, but he is unsure.  Increase Levemir to 14 units nightly and titrate up by 2 units every 3-4 days if fasting glucose >130 consistently.  Reinforced lifestyle measures. Limited options due to GFR of 30 mL/min If not making progress at next A1c check, consider endocrinology referral

## 2022-11-06 ENCOUNTER — Ambulatory Visit (INDEPENDENT_AMBULATORY_CARE_PROVIDER_SITE_OTHER): Payer: Medicare Other | Admitting: Pharmacist

## 2022-11-06 DIAGNOSIS — E1165 Type 2 diabetes mellitus with hyperglycemia: Secondary | ICD-10-CM

## 2022-11-06 DIAGNOSIS — N182 Chronic kidney disease, stage 2 (mild): Secondary | ICD-10-CM

## 2022-11-06 DIAGNOSIS — E782 Mixed hyperlipidemia: Secondary | ICD-10-CM

## 2022-11-06 DIAGNOSIS — I10 Essential (primary) hypertension: Secondary | ICD-10-CM

## 2022-11-06 NOTE — Progress Notes (Signed)
11/06/2022 Name: Matthew Fisher MRN: [id number] DOB: 1971/01/08  Chief Complaint  Patient presents with   Diabetes    Initial visit   Medication Management    Initial visit    Matthew Fisher is a 52 y.o. year old male who presented for a telephone visit. He was not available so spoke with his wife regarding medications.    They were referred to the pharmacist by their PCP for assistance in managing diabetes, hypertension, hyperlipidemia, medication access, and complex medication management.    Subjective:  Medication Access/Adherence Current Pharmacy:  [id number] [address] [place] [address] Phone: [phone] Fax: [phone]  Patient reports affordability concerns with their medications: Yes  Patient reports access/transportation concerns to their pharmacy: No  Patient reports adherence concerns with their medications:  Yes  forgets to take Levemir sometimes at night and cost of Januvia is prohibitive   Diabetes: Current medications:   Levemir 14 units daily - increase by 2 units every 3 to 4 days until blood glucose < 130.  Farxiga 10mg  daily. Samples provided by nephrologist but patient has not started due to concerns that [place] can cause infections Januvia 50mg  daily - has not taken in several months due to cost in coverage gap.   Current glucose readings: records not available during phone visit to patients wife.   Hypertension: Current medications: lisinopril 30mg  daily (increased yesterday), amlodipine 10mg  daily, carvedilol 25mg  daily  BP Readings from Last 3 Encounters:  11/05/22 (!) 154/84  10/16/22 132/80  09/24/22 130/80    Hyperlipidemia/ASCVD Risk Reduction Current lipid lowering medications:  atorvastatin 80mg  daily and ezetimibe 10mg  daily    Medication Management: Current adherence strategy: none.  Per wife patient does not know about his medications nor what they are all  used for.  Patient reports Fair adherence to medications  Barriers to adherence:  Multiple comorbidities Complex medication regimen Low health literacy Poor knowledge of the illness and medication. Independent pausing, stopping or controlling of the medication. Lack of knowledge and confidence in self-management. Low understanding of medications benefits. Lack of health insurance / under insured - medication cost   Recent fill dates:  noted that the following maintenance medications have not been filled regularly Januvia - last refill was 06/21/2022 - 30 days Amlodipine - last refill was 07/25/2022 for 90 days Carvedilol 25mg  - last refill was 07/15/2022 for 90 days.  Levemir - last refill was 01/27/2022 for 15 mL    Objective:  Lab Results  Component Value Date   HGBA1C 8.9 (H) 09/10/2022    Lab Results  Component Value Date   CREATININE 2.37 (H) 09/24/2022   BUN 26 (H) 09/24/2022   NA 136 09/24/2022   K 4.5 09/24/2022   CL 102 09/24/2022   CO2 26 09/24/2022    Lab Results  Component Value Date   CHOL 232 (H) 09/10/2022   HDL 46.50 09/10/2022   LDLCALC 167 (H) 09/10/2022   TRIG 95.0 09/10/2022   CHOLHDL 5 09/10/2022    Medications Reviewed Today     Reviewed by Matthew Fisher, RPH-CPP (Pharmacist) on 11/06/22 at 34  Med List Status: <None>   Medication Order Taking? Sig Documenting Provider Last Dose Status Informant  acetaminophen (TYLENOL) 325 MG tablet [id number]  Take 2 tablets (650 mg total) by mouth every 4 (four) hours as needed for headache or mild pain. Fisher, Ava, DO  Active   allopurinol (ZYLOPRIM) 300 MG tablet  [id number]  Take 1 tablet (300 mg total) by mouth daily. Matthew Merino, MD  Active   amLODipine (NORVASC) 10 MG tablet [id number]  Take 1 tablet (10 mg total) by mouth daily. Matthew Saver, NP  Active   aspirin 81 MG chewable tablet [id number]  Chew 1 tablet (81 mg total) by mouth daily. Matthew Mu, PA-C  Active Self  atorvastatin  (LIPITOR) 80 MG tablet [id number]  Take 1 tablet (80 mg total) by mouth daily. Matthew Saver, NP  Active   blood glucose meter kit and supplies KIT [id number]  Dispense based on patient and insurance preference. Use up to four times daily as directed. Please include lancets, test strips, control solution. Matthew Saver, NP  Active   carvedilol (COREG) 25 MG tablet [id number]  Take 1 tablet (25 mg total) by mouth 2 (two) times daily with a meal. Matthew Saver, NP  Active   Continuous Blood Gluc Receiver (FREESTYLE LIBRE 2 READER) [place]VI [id number] No 1 each by Does not apply route every 14 (fourteen) days.  Patient not taking: Reported on 11/06/2022   Matthew Saver, NP Not Taking Active   Continuous Blood Gluc Sensor (FREESTYLE LIBRE 2 SENSOR) [place] [id number] No 1 each by Does not apply route every 14 (fourteen) days.  Patient not taking: Reported on 11/06/2022   Matthew Saver, NP Not Taking Active   diclofenac Sodium (VOLTAREN) 1 % GEL [id number]  Apply 2 g topically 4 (four) times daily as needed (leg pain). Matthew Fast, MD  Active   ezetimibe (ZETIA) 10 MG tablet [id number]  Take 1 tablet (10 mg total) by mouth daily. Matthew Liter, MD  Active   ferrous sulfate 325 (65 FE) MG tablet [id number]  Take 1 tablet (325 mg total) by mouth every morning. Matthew Saver, NP  Active   furosemide (LASIX) 40 MG tablet [id number]  Take 1 tablet (40 mg total) by mouth daily. Matthew Saver, NP  Active   insulin detemir (LEVEMIR FLEXTOUCH) 100 UNIT/ML FlexPen [id number]  Inject 10 Units into the skin at bedtime. Start with 10 units every night. Titrate up by 2 units every 3 days if morning/fasting glucose is higher than 130 consistently. Matthew Saver, NP  Active   Insulin Pen Needle (PEN NEEDLES) 32G X 4 MM MISC [id number]  1 each by Does not apply route daily. Matthew Saver, NP  Active   lisinopril (ZESTRIL) 20 MG tablet [id number]  Take 1.5 tablets (30 mg total) by mouth daily. Matthew Saver, NP  Active    nitroGLYCERIN (NITROSTAT) 0.4 MG SL tablet [id number]  Place 1 tablet (0.4 mg total) under the tongue every 5 (five) minutes as needed for chest pain. Matthew Saver, NP  Active            Med Note (Matthew Fisher, Matthew Fisher   Tue Jun 12, 2022  9:25 AM) PRN  pantoprazole (PROTONIX) 40 MG tablet [id number]  Take 1 tablet (40 mg total) by mouth 2 (two) times daily. Matthew Saver, NP  Active   polyethylene glycol (MIRALAX) 17 g packet [id number]  Take 17 g by mouth daily.  Patient taking differently: Take 17 g by mouth as needed.   Palumbo, April, MD  Active Self  sitaGLIPtin (JANUVIA) 50 MG tablet [id number] No Take 1 tablet (50 mg total) by mouth daily.  Patient not taking: Reported on 11/06/2022   Matthew Saver, NP Not Taking Active   Vitamin Fisher, Ergocalciferol, (  DRISDOL) 1.25 MG (50000 UT) CAPS capsule [id number]  Take 50,000 Units by mouth every 7 (seven) days. [provider]  Active Self           Med Note (SATTERFIELD, Armstead Peaks   Thu Sep 10, 2019  6:28 PM) Take on Wednesdays               Assessment/Plan:   Diabetes: not at goal of A1c < 7.0% - Reviewed goal A1c, goal fasting, and goal 2 hour post prandial glucose - Reviewed [place] and med costs - Levemir will be discontinued in 2024 - recommend change to Tresiba 14 units once a daily - cost will be $35 per month the whole year (even during coverage gap). Patient's wife is to check to see how much Levemir they have on hand and then will determine when to change to [place]. Also recommend since patient forgets to take Levemir at night he can change and take [place] once each morning.  - Januvia and Wilder Glade will both be $47 per month and patient will reach coverage gap around May and cost of each will increase to around $150 / month. Patient should qualify for patient assistance program. Wilder Glade has CKD, CHF and DM benefits so encouraged patient to start [place]. Explained that risk of  genital fungal  infections is low.  - Recommend to check glucose 1 to 2 times daily.  Hypertension: not at goal of < 130/80 for CKD + DM - Reviewed long term cardiovascular and renal outcomes of uncontrolled blood pressure - Wife will check to see if he has all blood pressure meds at home - amlodipine, carvedilol and lisinopril. Will fill what is needed.    Hyperlipidemia/ASCVD Risk Reduction: not at goal of LDL < 100 - Recommend to continue atorvastatin 80mg  daily and ezetimibe 10mg  daily - patient did fill both meds after last lipids panel - 90 days on 09/13/2022 - will continue to follow lipids and adherence   Medication Management: - Currently strategy insufficient to maintain appropriate adherence to prescribed medication regimen - Suggested use of weekly pill box to organize medications - Created list of medication, indication, and administration time. Mailed to patient / wife -Working with patient on medication costs to improve adherence.  All meds will be $0 for 2024 except Farxiga $47/mo, Januvia $47/mo (until coverage gap) Insulin will be $35/mo all year. - Provided applications for Januvia and [place] patient assistance programs.  - Left sample of Libre 3 sensor at front desk for patient to retry.  Follow Up Plan: 1 to 2 months  Matthew Fisher, PharmD Clinical Pharmacist [facility]

## 2022-11-15 DIAGNOSIS — H35372 Puckering of macula, left eye: Secondary | ICD-10-CM | POA: Diagnosis not present

## 2022-11-15 DIAGNOSIS — Z961 Presence of intraocular lens: Secondary | ICD-10-CM | POA: Diagnosis not present

## 2022-11-15 DIAGNOSIS — E113513 Type 2 diabetes mellitus with proliferative diabetic retinopathy with macular edema, bilateral: Secondary | ICD-10-CM | POA: Diagnosis not present

## 2022-11-15 DIAGNOSIS — H25812 Combined forms of age-related cataract, left eye: Secondary | ICD-10-CM | POA: Diagnosis not present

## 2022-11-15 DIAGNOSIS — H3589 Other specified retinal disorders: Secondary | ICD-10-CM | POA: Diagnosis not present

## 2022-11-15 DIAGNOSIS — Z794 Long term (current) use of insulin: Secondary | ICD-10-CM | POA: Diagnosis not present

## 2022-11-26 ENCOUNTER — Ambulatory Visit: Payer: Medicare Other | Admitting: Family Medicine

## 2022-12-04 NOTE — Progress Notes (Deleted)
Office Visit Note  Patient: Matthew Fisher             Date of Birth: 02-Aug-1971           MRN: [id number]             PCP: Terrilyn Saver, NP Referring: Terrilyn Saver, NP Visit Date: 12/17/2022 Occupation: @GUAROCC @  Subjective:  Medication monitoring   History of Present Illness: Matthew Fisher is a 52 y.o. male with history of gout and osteoarthritis.  He is taking allopurinol 300 mg daily.   CBC and CMP updated on 12/10/22.  Uric acid will be updated today.   Activities of Daily Living:  Patient reports morning stiffness for *** {minute/hour:19697}.   Patient {ACTIONS;DENIES/REPORTS:21021675::"Denies"} nocturnal pain.  Difficulty dressing/grooming: {ACTIONS;DENIES/REPORTS:21021675::"Denies"} Difficulty climbing stairs: {ACTIONS;DENIES/REPORTS:21021675::"Denies"} Difficulty getting out of chair: {ACTIONS;DENIES/REPORTS:21021675::"Denies"} Difficulty using hands for taps, buttons, cutlery, and/or writing: {ACTIONS;DENIES/REPORTS:21021675::"Denies"}  No Rheumatology ROS completed.   PMFS History:  Patient Active Problem List   Diagnosis Date Noted   Primary osteoarthritis of both knees 01/09/2022   Gout 12/21/2021   GERD (gastroesophageal reflux disease) 12/21/2021   Arthritis 12/21/2021   Gastroesophageal reflux disease with esophagitis 12/04/2021   Chronic gout of knee 12/04/2021   Mixed hyperlipidemia 12/04/2021   Iron deficiency 12/04/2021   Acute gout due to renal impairment involving left knee 09/28/2021   Synovitis of right knee 09/28/2021   CHF (congestive heart failure) ([place]) 09/11/2019   Nonadherence to medical treatment    Tobacco abuse disorder    CAD (coronary artery disease) 10/18/2017   CKD (chronic kidney disease), stage II 10/18/2017   NSTEMI (non-ST elevated myocardial infarction) ([place]) 10/15/2017   Type 2 diabetes mellitus with hyperglycemia, without long-term current use of insulin ([place]) 10/15/2017   Hypertension 10/15/2017   Gastroparesis due  to secondary diabetes ([place]) 10/15/2017   Hypertensive emergency, no CHF     Past Medical History:  Diagnosis Date   Arthritis    CAD (coronary artery disease)    a. NSTEMI 10/2017 s/p multivessel PCI of the OM1, PDA, PLA ostium and mid PLA.   CKD (chronic kidney disease), stage II    GERD (gastroesophageal reflux disease)    Gout    Hypertension    Insulin dependent diabetes mellitus     Family History  Problem Relation Age of Onset   Diabetes Mother    Cancer Father    Healthy Daughter    Healthy Daughter    Healthy Son    Past Surgical History:  Procedure Laterality Date   CORONARY STENT INTERVENTION N/A 10/17/2017   Procedure: CORONARY STENT INTERVENTION;  Surgeon: Jettie Booze, MD;  Location: [facility];  Service: Cardiovascular;  Laterality: N/A;   EYE SURGERY     EYE SURGERY Right 08/2022   laser surgery   FRACTURE SURGERY     right ring finger   KNEE ARTHROSCOPY WITH LATERAL MENISECTOMY Left 01/22/2013   Procedure: LEFT KNEE ARTHROSCOPY DEBRIDEMENT CHONDROPLASTY, LATERAL RELEASE AND partial medial meniscectomy;  Surgeon: Johnn Hai, MD;  Location: [facility];  Service: Orthopedics;  Laterality: Left;   KNEE SURGERY     "removed bone"   LEFT HEART CATH N/A 10/17/2017   Procedure: Left Heart Cath;  Surgeon: Jettie Booze, MD;  Location: [facility];  Service: Cardiovascular;  Laterality: N/A;   LEFT HEART CATH AND CORONARY ANGIOGRAPHY N/A 10/15/2017   Procedure: LEFT HEART CATH AND CORONARY ANGIOGRAPHY;  Surgeon: Leonie Man, MD;  Location: [facility];  Service: Cardiovascular;  Laterality: N/A;   SHOULDER SURGERY Bilateral    Social History   Social History Narrative   Not on file   Immunization History  Administered Date(s) Administered   PFIZER(Purple Top)SARS-COV-2 Vaccination 04/19/2020, 05/10/2020   Tdap 12/12/2004, 05/09/2011     Objective: Vital Signs: There were no vitals taken for this visit.   Physical  Exam Vitals and nursing note reviewed.  Constitutional:      Appearance: He is well-developed.  HENT:     Head: Normocephalic and atraumatic.  Eyes:     Conjunctiva/sclera: Conjunctivae normal.     Pupils: Pupils are equal, round, and reactive to light.  Cardiovascular:     Rate and Rhythm: Normal rate and regular rhythm.     Heart sounds: Normal heart sounds.  Pulmonary:     Effort: Pulmonary effort is normal.     Breath sounds: Normal breath sounds.  Abdominal:     General: Bowel sounds are normal.     Palpations: Abdomen is soft.  Musculoskeletal:     Cervical back: Normal range of motion and neck supple.  Skin:    General: Skin is warm and dry.     Capillary Refill: Capillary refill takes less than 2 seconds.  Neurological:     Mental Status: He is alert and oriented to person, place, and time.  Psychiatric:        Behavior: Behavior normal.      Musculoskeletal Exam: ***  CDAI Exam: CDAI Score: -- Patient Global: --; Provider Global: -- Swollen: --; Tender: -- Joint Exam 12/17/2022   No joint exam has been documented for this visit   There is currently no information documented on the homunculus. Go to the Rheumatology activity and complete the homunculus joint exam.  Investigation: No additional findings.  Imaging: No results found.  Recent Labs: Lab Results  Component Value Date   WBC 5.7 09/10/2022   HGB 11.8 (L) 09/10/2022   PLT 238.0 09/10/2022   NA 136 09/24/2022   K 4.5 09/24/2022   CL 102 09/24/2022   CO2 26 09/24/2022   GLUCOSE 233 (H) 09/24/2022   BUN 26 (H) 09/24/2022   CREATININE 2.37 (H) 09/24/2022   BILITOT 0.5 09/24/2022   ALKPHOS 88 09/24/2022   AST 19 09/24/2022   ALT 15 09/24/2022   PROT 6.4 09/24/2022   ALBUMIN 3.8 09/24/2022   CALCIUM 8.6 09/24/2022   GFRAA 42 (L) 09/12/2019    Speciality Comments: No specialty comments available.  Procedures:  No procedures performed Allergies: Patient has no known allergies.    Assessment / Plan:     Visit Diagnoses: Idiopathic chronic gout of multiple sites with tophus  Contracture of joint of both elbows  Primary osteoarthritis of both knees  Pain in both feet  CKD (chronic kidney disease), stage II  Hypertensive emergency, no CHF  Coronary artery disease involving native coronary artery of native heart without angina pectoris  NSTEMI (non-ST elevated myocardial infarction) ([place])  Type 2 diabetes mellitus with hyperglycemia, without long-term current use of insulin (HCC)  Mixed hyperlipidemia  Gastroparesis due to secondary diabetes ([place])  History of gastroesophageal reflux (GERD)  Iron deficiency  Tobacco abuse disorder  Orders: No orders of the defined types were placed in this encounter.  No orders of the defined types were placed in this encounter.   Face-to-face time spent with patient was *** minutes. Greater than 50% of time was spent in counseling and coordination of  care.  Follow-Up Instructions: No follow-ups on file.   Ofilia Neas, PA-C  Note - This record has been created using Dragon software.  Chart creation errors have been sought, but may not always  have been located. Such creation errors do not reflect on  the standard of medical care.

## 2022-12-10 ENCOUNTER — Ambulatory Visit (INDEPENDENT_AMBULATORY_CARE_PROVIDER_SITE_OTHER): Payer: Medicare Other | Admitting: Family Medicine

## 2022-12-10 ENCOUNTER — Encounter: Payer: Self-pay | Admitting: Family Medicine

## 2022-12-10 VITALS — BP 143/78 | HR 78 | Resp 18 | Ht 68.0 in | Wt 215.8 lb

## 2022-12-10 DIAGNOSIS — I509 Heart failure, unspecified: Secondary | ICD-10-CM | POA: Diagnosis not present

## 2022-12-10 DIAGNOSIS — E782 Mixed hyperlipidemia: Secondary | ICD-10-CM

## 2022-12-10 DIAGNOSIS — R109 Unspecified abdominal pain: Secondary | ICD-10-CM

## 2022-12-10 DIAGNOSIS — I10 Essential (primary) hypertension: Secondary | ICD-10-CM | POA: Diagnosis not present

## 2022-12-10 DIAGNOSIS — E1165 Type 2 diabetes mellitus with hyperglycemia: Secondary | ICD-10-CM

## 2022-12-10 LAB — URINALYSIS, ROUTINE W REFLEX MICROSCOPIC
Bilirubin Urine: NEGATIVE
Ketones, ur: NEGATIVE
Leukocytes,Ua: NEGATIVE
Nitrite: NEGATIVE
Specific Gravity, Urine: 1.02 (ref 1.000–1.030)
Total Protein, Urine: 100 — AB
Urine Glucose: 500 — AB
Urobilinogen, UA: 0.2 (ref 0.0–1.0)
pH: 6 (ref 5.0–8.0)

## 2022-12-10 LAB — LIPID PANEL
Cholesterol: 172 mg/dL (ref 0–200)
HDL: 44.3 mg/dL (ref >39.00)
LDL Cholesterol: 107 mg/dL — ABNORMAL HIGH (ref 0–99)
NonHDL: 127.46
Total CHOL/HDL Ratio: 4
Triglycerides: 102 mg/dL (ref 0.0–149.0)
VLDL: 20.4 mg/dL (ref 0.0–40.0)

## 2022-12-10 LAB — CBC WITH DIFFERENTIAL/PLATELET
Basophils Absolute: 0.1 10*3/uL (ref 0.0–0.1)
Basophils Relative: 1 % (ref 0.0–3.0)
Eosinophils Absolute: 0.2 10*3/uL (ref 0.0–0.7)
Eosinophils Relative: 2.7 % (ref 0.0–5.0)
HCT: 31.3 % — ABNORMAL LOW (ref 39.0–52.0)
Hemoglobin: 11.2 g/dL — ABNORMAL LOW (ref 13.0–17.0)
Lymphocytes Relative: 39.8 % (ref 12.0–46.0)
Lymphs Abs: 2.3 10*3/uL (ref 0.7–4.0)
MCHC: 35.9 g/dL (ref 30.0–36.0)
MCV: 89 fl (ref 78.0–100.0)
Monocytes Absolute: 0.5 10*3/uL (ref 0.1–1.0)
Monocytes Relative: 8.6 % (ref 3.0–12.0)
Neutro Abs: 2.7 10*3/uL (ref 1.4–7.7)
Neutrophils Relative %: 47.9 % (ref 43.0–77.0)
Platelets: 237 10*3/uL (ref 150.0–400.0)
RBC: 3.52 Mil/uL — ABNORMAL LOW (ref 4.22–5.81)
RDW: 13.7 % (ref 11.5–15.5)
WBC: 5.7 10*3/uL (ref 4.0–10.5)

## 2022-12-10 LAB — COMPREHENSIVE METABOLIC PANEL
ALT: 13 U/L (ref 0–53)
AST: 20 U/L (ref 0–37)
Albumin: 3.7 g/dL (ref 3.5–5.2)
Alkaline Phosphatase: 92 U/L (ref 39–117)
BUN: 30 mg/dL — ABNORMAL HIGH (ref 6–23)
CO2: 25 mEq/L (ref 19–32)
Calcium: 8.7 mg/dL (ref 8.4–10.5)
Chloride: 102 mEq/L (ref 96–112)
Creatinine, Ser: 2.5 mg/dL — ABNORMAL HIGH (ref 0.40–1.50)
GFR: 28.66 mL/min — ABNORMAL LOW (ref >60.00)
Glucose, Bld: 216 mg/dL — ABNORMAL HIGH (ref 70–99)
Potassium: 4.6 mEq/L (ref 3.5–5.1)
Sodium: 136 mEq/L (ref 135–145)
Total Bilirubin: 0.6 mg/dL (ref 0.2–1.2)
Total Protein: 6.5 g/dL (ref 6.0–8.3)

## 2022-12-10 LAB — MICROALBUMIN / CREATININE URINE RATIO
Creatinine,U: 84.7 mg/dL
Microalb Creat Ratio: 133.6 mg/g — ABNORMAL HIGH (ref 0.0–30.0)
Microalb, Ur: 113.2 mg/dL — ABNORMAL HIGH (ref 0.0–1.9)

## 2022-12-10 LAB — HEMOGLOBIN A1C: Hgb A1c MFr Bld: 7.8 % — ABNORMAL HIGH (ref 4.6–6.5)

## 2022-12-10 MED ORDER — LISINOPRIL 40 MG PO TABS: 40.0000 mg | ORAL_TABLET | Freq: Every day | ORAL | 1 refills | Status: DC

## 2022-12-10 NOTE — Assessment & Plan Note (Signed)
Blood pressure is not at goal for age and co-morbidities.   Recommendations: continue amlodipine 10 mg daily, coreg 25 mg BID, increase lisinopril to 40 mg daily - BP goal <130/80 - monitor and log blood pressures at home - check around the same time each day in a relaxed setting - Limit salt to <2000 mg/day - Follow DASH eating plan (heart healthy diet) - limit alcohol to 2 standard drinks per day for men and 1 per day for women - avoid tobacco products - get at least 2 hours of regular aerobic exercise weekly Patient aware of signs/symptoms requiring further/urgent evaluation. Follow-up in 2 weeks for BP check and BMP

## 2022-12-10 NOTE — Patient Instructions (Addendum)
Increase lisinopril to 40 mg daily per nephrologist recommendations- new prescription sent in.  Updating blood work and urine sample today  Continue monitoring your blood sugar and blood pressure at home Recommend you start the [place].

## 2022-12-10 NOTE — Assessment & Plan Note (Signed)
No signs of fluid overload today.

## 2022-12-10 NOTE — Assessment & Plan Note (Signed)
-  Reviewed most recent lipid panel -Medication management: restart the Lipitor 80 mg daily, Zetia 10 mg daily -Repeat CMP and lipid panel today -Diet low in saturated fat -Regular exercise - at least 30 minutes, 5 times per week

## 2022-12-10 NOTE — Progress Notes (Signed)
Established Patient Office Visit  Subjective   Patient ID: Matthew Fisher, male    DOB: 1970/11/29  Age: 52 y.o. MRN: [id number]  Chief Complaint  Patient presents with   Follow-up    HPI  Patient is here for follow-up. He had stopped taking all meds as of our follow-up on 09/10/22 because he thought they were making him feel bad. At appointment on 09/24/22 he reported he had restarted everything except Januvia (cost) and coreg and lasix. We agreed to restart the Coreg and had him check for Januvia coupon. In the meantime he was encouraged to increase insulin to 14 units and titrate up based on fasting glucose levels.  He saw his nephrologist on 10/26/22 and they recommended eventually maxing out his lisinopril (may mean stopping amlodipine pending BP response) and starting [place]. He saw our pharmacist, Cherre Robins, on 11/06/22, and she discussed getting him applied for a medication assistance program for [place] and Farxiga. She questioned compliance with Levemir given last fill dates and suggested switching to [place] ($35/month).   Diabetes: - Checking BG at home: yes, 146 fasting today  - Medications: Kelle Darting (has not started); Levemir - reports he is doing 15 units of insulin nightly (reports he forgets some nights) - states he has a lot left right now, agreeable to change to [place] when he is due for next refill.  - Compliance: fair - Diet: fair - Exercise: active lifestyle - Eye exam: UTD - Foot exam: UTD - Microalbumin: ordered - Denies symptoms of hypoglycemia, polyuria, polydipsia, numbness extremities, foot ulcers/trauma, wounds that are not healing, medication side effects     Hypertension: - Medications: amlodipine 10 mg daily, coreg 25 mg BID, lisinopril 20 mg daily - Compliance: good - Checking BP at home: occasionally,  - Denies any SOB, recurrent headaches, CP, vision changes, LE edema, dizziness, palpitations, or medication side  effects.       ROS All review of systems negative except what is listed in the HPI    Objective:     BP (!) 143/78   Pulse 78   Resp 18   Ht '5\' 8"'$  (1.727 m)   Wt 215 lb 12.8 oz (97.9 kg)   SpO2 100%   BMI 32.81 kg/m    Physical Exam Vitals reviewed.  Constitutional:      Appearance: Normal appearance.  Cardiovascular:     Rate and Rhythm: Normal rate and regular rhythm.     Pulses: Normal pulses.     Heart sounds: Normal heart sounds.  Pulmonary:     Effort: Pulmonary effort is normal.     Breath sounds: Normal breath sounds.  Musculoskeletal:     Right lower leg: No edema.     Left lower leg: No edema.  Skin:    General: Skin is warm and dry.  Neurological:     Mental Status: He is alert and oriented to person, place, and time.  Psychiatric:        Mood and Affect: Mood normal.        Behavior: Behavior normal.        Thought Content: Thought content normal.        Judgment: Judgment normal.      No results found for any visits on 12/10/22.    The ASCVD Risk score (Arnett DK, et al., 2019) failed to calculate for the following reasons:   The patient has a prior MI or stroke diagnosis    Assessment & Plan:  Problem List Items Addressed This Visit       Cardiovascular and Mediastinum   Hypertension (Chronic)    Blood pressure is not at goal for age and co-morbidities.   Recommendations: continue amlodipine 10 mg daily, coreg 25 mg BID, increase lisinopril to 40 mg daily - BP goal <130/80 - monitor and log blood pressures at home - check around the same time each day in a relaxed setting - Limit salt to <2000 mg/day - Follow DASH eating plan (heart healthy diet) - limit alcohol to 2 standard drinks per day for men and 1 per day for women - avoid tobacco products - get at least 2 hours of regular aerobic exercise weekly Patient aware of signs/symptoms requiring further/urgent evaluation. Follow-up in 2 weeks for BP check and BMP         Relevant Medications   lisinopril (ZESTRIL) 40 MG tablet   Other Relevant Orders   Comprehensive metabolic panel   CBC with Differential/Platelet   Lipid panel   Basic Metabolic Panel (BMET)   CHF (congestive heart failure) (HCC)    No signs of fluid overload today.       Relevant Medications   lisinopril (ZESTRIL) 40 MG tablet   Other Relevant Orders   Comprehensive metabolic panel   CBC with Differential/Platelet     Endocrine   Type 2 diabetes mellitus with hyperglycemia, without long-term current use of insulin (HCC) (Chronic)    Continue Levemir 15 units nightly - discussed significance of compliance. Agreeable to change to [place] when next refill is due (he will let us know when needed; states he has plenty right now). Continue Farxiga, recommend starting the Januvia Reinforced lifestyle measures. Labs today Continue following with pharmacy team.       Relevant Medications   lisinopril (ZESTRIL) 40 MG tablet   Other Relevant Orders   HgB A1c   Microalbumin / creatinine urine ratio     Other   Mixed hyperlipidemia - Primary (Chronic)    -Reviewed most recent lipid panel -Medication management: restart the Lipitor 80 mg daily, Zetia 10 mg daily -Repeat CMP and lipid panel today -Diet low in saturated fat -Regular exercise - at least 30 minutes, 5 times per week      Relevant Medications   lisinopril (ZESTRIL) 40 MG tablet   Other Relevant Orders   Comprehensive metabolic panel   Lipid panel   Other Visit Diagnoses     Left flank pain     He thinks it may be a strain from work, but given location and history, will check urine today.  Patient aware of signs/symptoms requiring further/urgent evaluation.    Relevant Orders   Urinalysis, Routine w reflex microscopic   Urine Culture      Return in about 2 weeks (around 12/24/2022) for BP check with nurse, lab appt; schedule 1 month follow-up with Tammy .  Routine f/u with me in 3 months, or sooner pending labs  or as needed.  Terrilyn Saver, NP

## 2022-12-10 NOTE — Assessment & Plan Note (Signed)
Continue Levemir 15 units nightly - discussed significance of compliance. Agreeable to change to [place] when next refill is due (he will let us know when needed; states he has plenty right now). Continue Farxiga, recommend starting the Januvia Reinforced lifestyle measures. Labs today Continue following with pharmacy team.

## 2022-12-11 LAB — URINE CULTURE
MICRO NUMBER:: 14644510
Result:: NO GROWTH
SPECIMEN QUALITY:: ADEQUATE

## 2022-12-13 ENCOUNTER — Telehealth: Payer: Self-pay | Admitting: *Deleted

## 2022-12-13 ENCOUNTER — Other Ambulatory Visit: Payer: Self-pay | Admitting: *Deleted

## 2022-12-13 ENCOUNTER — Other Ambulatory Visit: Payer: Self-pay | Admitting: Family Medicine

## 2022-12-13 DIAGNOSIS — E1165 Type 2 diabetes mellitus with hyperglycemia: Secondary | ICD-10-CM

## 2022-12-13 MED ORDER — SITAGLIPTIN PHOSPHATE 50 MG PO TABS: 50.0000 mg | ORAL_TABLET | Freq: Every day | ORAL | 2 refills | Status: DC

## 2022-12-13 MED ORDER — DAPAGLIFLOZIN PROPANEDIOL 10 MG PO TABS: 10.0000 mg | ORAL_TABLET | Freq: Every day | ORAL | 2 refills | Status: DC

## 2022-12-13 NOTE — Telephone Encounter (Signed)
Did you see this note from [place] on the labs?

## 2022-12-13 NOTE — Telephone Encounter (Signed)
-----   Message from Terrilyn Saver, NP sent at 12/13/2022  7:57 AM EST ----- Urine culture was negative

## 2022-12-13 NOTE — Progress Notes (Signed)
See phone note

## 2022-12-13 NOTE — Telephone Encounter (Signed)
Patient wife notified of urine results and also advised that he should be on both medications.  Both rxs refilled.

## 2022-12-13 NOTE — Telephone Encounter (Signed)
Matthew Fisher, [place] 12/11/2022  5:02 PM EST     Spoke with pts wife to go over results, pt would like to know if he should [place] or Januvia and which fish is bad for gout.   Terrilyn Saver, NP 12/10/2022  8:35 PM EST     A1c is starting to come down. Continue with current meds. Recommend starting the Januvia. Kidney function is slightly worse - increased lisinopril to 40 mg daily. Continue following with nephrology. Blood counts are stable Cholesterol is improving - continue with lifestyle measures. Goal LDL <70. Continue following with cardiology.   Lifestyle factors for lowering cholesterol include: Diet therapy - heart-healthy diet rich in fruits, veggies, fiber-rich whole grains, lean meats, chicken, fish (at least twice a week), fat-free or 1% dairy products; foods low in saturated/trans fats, cholesterol, sodium, and sugar. Mediterranean diet has shown to be very heart healthy. Regular exercise - recommend at least 30 minutes a day, 5 times per week Weight management   Schedule a routine 50-monthfollow-up.

## 2022-12-14 ENCOUNTER — Other Ambulatory Visit: Payer: Self-pay

## 2022-12-14 MED ORDER — DAPAGLIFLOZIN PROPANEDIOL 10 MG PO TABS: 10.0000 mg | ORAL_TABLET | Freq: Every day | ORAL | 2 refills | Status: DC

## 2022-12-17 ENCOUNTER — Ambulatory Visit: Payer: Medicare Other | Admitting: Physician Assistant

## 2022-12-17 ENCOUNTER — Telehealth: Payer: Self-pay | Admitting: Family Medicine

## 2022-12-17 DIAGNOSIS — E611 Iron deficiency: Secondary | ICD-10-CM

## 2022-12-17 DIAGNOSIS — M1A09X1 Idiopathic chronic gout, multiple sites, with tophus (tophi): Secondary | ICD-10-CM

## 2022-12-17 DIAGNOSIS — M170 Bilateral primary osteoarthritis of knee: Secondary | ICD-10-CM

## 2022-12-17 DIAGNOSIS — M24521 Contracture, right elbow: Secondary | ICD-10-CM

## 2022-12-17 DIAGNOSIS — N182 Chronic kidney disease, stage 2 (mild): Secondary | ICD-10-CM

## 2022-12-17 DIAGNOSIS — M79672 Pain in left foot: Secondary | ICD-10-CM

## 2022-12-17 DIAGNOSIS — Z720 Tobacco use: Secondary | ICD-10-CM

## 2022-12-17 DIAGNOSIS — E1165 Type 2 diabetes mellitus with hyperglycemia: Secondary | ICD-10-CM

## 2022-12-17 DIAGNOSIS — Z8719 Personal history of other diseases of the digestive system: Secondary | ICD-10-CM

## 2022-12-17 DIAGNOSIS — I2510 Atherosclerotic heart disease of native coronary artery without angina pectoris: Secondary | ICD-10-CM

## 2022-12-17 DIAGNOSIS — E1343 Other specified diabetes mellitus with diabetic autonomic (poly)neuropathy: Secondary | ICD-10-CM

## 2022-12-17 DIAGNOSIS — I161 Hypertensive emergency: Secondary | ICD-10-CM

## 2022-12-17 DIAGNOSIS — E782 Mixed hyperlipidemia: Secondary | ICD-10-CM

## 2022-12-17 DIAGNOSIS — I214 Non-ST elevation (NSTEMI) myocardial infarction: Secondary | ICD-10-CM

## 2022-12-17 NOTE — Telephone Encounter (Signed)
Danae Chen (spouse DPR OK) called stating that she was looking to discuss  acouple of the pt's medications with Tammy. Advised a note would be sent back to her due to her being on another call with a pt.

## 2022-12-17 NOTE — Progress Notes (Unsigned)
Office Visit Note  Patient: Matthew Fisher             Date of Birth: August 19, 1971           MRN: [id number]             PCP: Terrilyn Saver, NP Referring: Terrilyn Saver, NP Visit Date: 12/18/2022 Occupation: '@GUAROCC'$ @  Subjective:  Discuss lab work   History of Present Illness: Matthew Fisher is a 52 y.o. male with history of gout and osteoarthritis. Allopurinol was increased to 300 mg daily at his last office visit.     Activities of Daily Living:  Patient reports morning stiffness for *** {minute/hour:19697}.   Patient {ACTIONS;DENIES/REPORTS:21021675::"Denies"} nocturnal pain.  Difficulty dressing/grooming: {ACTIONS;DENIES/REPORTS:21021675::"Denies"} Difficulty climbing stairs: {ACTIONS;DENIES/REPORTS:21021675::"Denies"} Difficulty getting out of chair: {ACTIONS;DENIES/REPORTS:21021675::"Denies"} Difficulty using hands for taps, buttons, cutlery, and/or writing: {ACTIONS;DENIES/REPORTS:21021675::"Denies"}  No Rheumatology ROS completed.   PMFS History:  Patient Active Problem List   Diagnosis Date Noted   Primary osteoarthritis of both knees 01/09/2022   Gout 12/21/2021   GERD (gastroesophageal reflux disease) 12/21/2021   Arthritis 12/21/2021   Gastroesophageal reflux disease with esophagitis 12/04/2021   Chronic gout of knee 12/04/2021   Mixed hyperlipidemia 12/04/2021   Iron deficiency 12/04/2021   Acute gout due to renal impairment involving left knee 09/28/2021   Synovitis of right knee 09/28/2021   CHF (congestive heart failure) ([place]) 09/11/2019   Nonadherence to medical treatment    Tobacco abuse disorder    CAD (coronary artery disease) 10/18/2017   CKD (chronic kidney disease), stage II 10/18/2017   NSTEMI (non-ST elevated myocardial infarction) ([place]) 10/15/2017   Type 2 diabetes mellitus with hyperglycemia, without long-term current use of insulin ([place]) 10/15/2017   Hypertension 10/15/2017   Gastroparesis due to secondary diabetes ([place]) 10/15/2017    Hypertensive emergency, no CHF     Past Medical History:  Diagnosis Date   Arthritis    CAD (coronary artery disease)    a. NSTEMI 10/2017 s/p multivessel PCI of the OM1, PDA, PLA ostium and mid PLA.   CKD (chronic kidney disease), stage II    GERD (gastroesophageal reflux disease)    Gout    Hypertension    Insulin dependent diabetes mellitus     Family History  Problem Relation Age of Onset   Diabetes Mother    Cancer Father    Healthy Daughter    Healthy Daughter    Healthy Son    Past Surgical History:  Procedure Laterality Date   CORONARY STENT INTERVENTION N/A 10/17/2017   Procedure: CORONARY STENT INTERVENTION;  Surgeon: Jettie Booze, MD;  Location: [facility];  Service: Cardiovascular;  Laterality: N/A;   EYE SURGERY     EYE SURGERY Right 08/2022   laser surgery   FRACTURE SURGERY     right ring finger   KNEE ARTHROSCOPY WITH LATERAL MENISECTOMY Left 01/22/2013   Procedure: LEFT KNEE ARTHROSCOPY DEBRIDEMENT CHONDROPLASTY, LATERAL RELEASE AND partial medial meniscectomy;  Surgeon: Johnn Hai, MD;  Location: [facility];  Service: Orthopedics;  Laterality: Left;   KNEE SURGERY     "removed bone"   LEFT HEART CATH N/A 10/17/2017   Procedure: Left Heart Cath;  Surgeon: Jettie Booze, MD;  Location: [facility];  Service: Cardiovascular;  Laterality: N/A;   LEFT HEART CATH AND CORONARY ANGIOGRAPHY N/A 10/15/2017   Procedure: LEFT HEART CATH AND CORONARY ANGIOGRAPHY;  Surgeon: Leonie Man, MD;  Location: [facility];  Service: Cardiovascular;  Laterality: N/A;   SHOULDER SURGERY Bilateral    Social History   Social History Narrative   Not on file   Immunization History  Administered Date(s) Administered   PFIZER(Purple Top)SARS-COV-2 Vaccination 04/19/2020, 05/10/2020   Tdap 12/12/2004, 05/09/2011     Objective: Vital Signs: There were no vitals taken for this visit.   Physical Exam Vitals and nursing note reviewed.   Constitutional:      Appearance: He is well-developed.  HENT:     Head: Normocephalic and atraumatic.  Eyes:     Conjunctiva/sclera: Conjunctivae normal.     Pupils: Pupils are equal, round, and reactive to light.  Cardiovascular:     Rate and Rhythm: Normal rate and regular rhythm.     Heart sounds: Normal heart sounds.  Pulmonary:     Effort: Pulmonary effort is normal.     Breath sounds: Normal breath sounds.  Abdominal:     General: Bowel sounds are normal.     Palpations: Abdomen is soft.  Musculoskeletal:     Cervical back: Normal range of motion and neck supple.  Skin:    General: Skin is warm and dry.     Capillary Refill: Capillary refill takes less than 2 seconds.  Neurological:     Mental Status: He is alert and oriented to person, place, and time.  Psychiatric:        Behavior: Behavior normal.      Musculoskeletal Exam: ***  CDAI Exam: CDAI Score: -- Patient Global: --; Provider Global: -- Swollen: --; Tender: -- Joint Exam 12/18/2022   No joint exam has been documented for this visit   There is currently no information documented on the homunculus. Go to the Rheumatology activity and complete the homunculus joint exam.  Investigation: No additional findings.  Imaging: No results found.  Recent Labs: Lab Results  Component Value Date   WBC 5.7 12/10/2022   HGB 11.2 (L) 12/10/2022   PLT 237.0 12/10/2022   NA 136 12/10/2022   K 4.6 12/10/2022   CL 102 12/10/2022   CO2 25 12/10/2022   GLUCOSE 216 (H) 12/10/2022   BUN 30 (H) 12/10/2022   CREATININE 2.50 (H) 12/10/2022   BILITOT 0.6 12/10/2022   ALKPHOS 92 12/10/2022   AST 20 12/10/2022   ALT 13 12/10/2022   PROT 6.5 12/10/2022   ALBUMIN 3.7 12/10/2022   CALCIUM 8.7 12/10/2022   GFRAA 42 (L) 09/12/2019    Speciality Comments: No specialty comments available.  Procedures:  No procedures performed Allergies: Patient has no known allergies.   Assessment / Plan:     Visit Diagnoses:  Idiopathic chronic gout of multiple sites with tophus  Pain in both hands  Contracture of joint of both elbows  Primary osteoarthritis of both knees  Pain in both feet  CKD (chronic kidney disease), stage II  Hypertensive emergency, no CHF  Coronary artery disease involving native coronary artery of native heart without angina pectoris  NSTEMI (non-ST elevated myocardial infarction) ([place])  Type 2 diabetes mellitus with hyperglycemia, without long-term current use of insulin ([facility])  Mixed hyperlipidemia  Gastroparesis due to secondary diabetes ([place])  History of gastroesophageal reflux (GERD)  Iron deficiency  Tobacco abuse disorder  Orders: No orders of the defined types were placed in this encounter.  No orders of the defined types were placed in this encounter.   Face-to-face time spent with patient was *** minutes. Greater than 50% of time was spent in counseling and coordination of care.  Follow-Up Instructions:  No follow-ups on file.   Ofilia Neas, PA-C  Note - This record has been created using Dragon software.  Chart creation errors have been sought, but may not always  have been located. Such creation errors do not reflect on  the standard of medical care.

## 2022-12-18 ENCOUNTER — Ambulatory Visit: Payer: Medicare Other | Attending: Physician Assistant | Admitting: Physician Assistant

## 2022-12-18 ENCOUNTER — Encounter: Payer: Self-pay | Admitting: Physician Assistant

## 2022-12-18 VITALS — BP 179/86 | HR 80 | Resp 15 | Ht 68.0 in | Wt 219.0 lb

## 2022-12-18 DIAGNOSIS — M79641 Pain in right hand: Secondary | ICD-10-CM | POA: Diagnosis not present

## 2022-12-18 DIAGNOSIS — Z720 Tobacco use: Secondary | ICD-10-CM

## 2022-12-18 DIAGNOSIS — M1A09X1 Idiopathic chronic gout, multiple sites, with tophus (tophi): Secondary | ICD-10-CM

## 2022-12-18 DIAGNOSIS — Z5181 Encounter for therapeutic drug level monitoring: Secondary | ICD-10-CM

## 2022-12-18 DIAGNOSIS — N184 Chronic kidney disease, stage 4 (severe): Secondary | ICD-10-CM

## 2022-12-18 DIAGNOSIS — M79642 Pain in left hand: Secondary | ICD-10-CM

## 2022-12-18 DIAGNOSIS — I161 Hypertensive emergency: Secondary | ICD-10-CM

## 2022-12-18 DIAGNOSIS — Z8719 Personal history of other diseases of the digestive system: Secondary | ICD-10-CM | POA: Diagnosis not present

## 2022-12-18 DIAGNOSIS — M24522 Contracture, left elbow: Secondary | ICD-10-CM

## 2022-12-18 DIAGNOSIS — N182 Chronic kidney disease, stage 2 (mild): Secondary | ICD-10-CM

## 2022-12-18 DIAGNOSIS — M79671 Pain in right foot: Secondary | ICD-10-CM | POA: Diagnosis not present

## 2022-12-18 DIAGNOSIS — E1165 Type 2 diabetes mellitus with hyperglycemia: Secondary | ICD-10-CM

## 2022-12-18 DIAGNOSIS — M24521 Contracture, right elbow: Secondary | ICD-10-CM | POA: Diagnosis not present

## 2022-12-18 DIAGNOSIS — E782 Mixed hyperlipidemia: Secondary | ICD-10-CM | POA: Diagnosis not present

## 2022-12-18 DIAGNOSIS — I214 Non-ST elevation (NSTEMI) myocardial infarction: Secondary | ICD-10-CM

## 2022-12-18 DIAGNOSIS — E611 Iron deficiency: Secondary | ICD-10-CM

## 2022-12-18 DIAGNOSIS — M79672 Pain in left foot: Secondary | ICD-10-CM

## 2022-12-18 DIAGNOSIS — I2510 Atherosclerotic heart disease of native coronary artery without angina pectoris: Secondary | ICD-10-CM

## 2022-12-18 DIAGNOSIS — E1343 Other specified diabetes mellitus with diabetic autonomic (poly)neuropathy: Secondary | ICD-10-CM

## 2022-12-18 DIAGNOSIS — M170 Bilateral primary osteoarthritis of knee: Secondary | ICD-10-CM

## 2022-12-18 NOTE — Patient Instructions (Signed)

## 2022-12-19 ENCOUNTER — Ambulatory Visit (INDEPENDENT_AMBULATORY_CARE_PROVIDER_SITE_OTHER): Payer: Medicare Other | Admitting: Pharmacist

## 2022-12-19 DIAGNOSIS — N182 Chronic kidney disease, stage 2 (mild): Secondary | ICD-10-CM

## 2022-12-19 DIAGNOSIS — E782 Mixed hyperlipidemia: Secondary | ICD-10-CM

## 2022-12-19 DIAGNOSIS — I509 Heart failure, unspecified: Secondary | ICD-10-CM

## 2022-12-19 DIAGNOSIS — I10 Essential (primary) hypertension: Secondary | ICD-10-CM

## 2022-12-19 DIAGNOSIS — E1165 Type 2 diabetes mellitus with hyperglycemia: Secondary | ICD-10-CM

## 2022-12-19 LAB — CBC WITH DIFFERENTIAL/PLATELET
Absolute Monocytes: 446 cells/uL (ref 200–950)
Basophils Absolute: 39 cells/uL (ref 0–200)
Basophils Relative: 0.7 %
Eosinophils Absolute: 171 cells/uL (ref 15–500)
Eosinophils Relative: 3.1 %
HCT: 32.8 % — ABNORMAL LOW (ref 38.5–50.0)
Hemoglobin: 11.3 g/dL — ABNORMAL LOW (ref 13.2–17.1)
Lymphs Abs: 1958 cells/uL (ref 850–3900)
MCH: 31.5 pg (ref 27.0–33.0)
MCHC: 34.5 g/dL (ref 32.0–36.0)
MCV: 91.4 fL (ref 80.0–100.0)
MPV: 10.2 fL (ref 7.5–12.5)
Monocytes Relative: 8.1 %
Neutro Abs: 2888 cells/uL (ref 1500–7800)
Neutrophils Relative %: 52.5 %
Platelets: 206 10*3/uL (ref 140–400)
RBC: 3.59 10*6/uL — ABNORMAL LOW (ref 4.20–5.80)
RDW: 12.9 % (ref 11.0–15.0)
Total Lymphocyte: 35.6 %
WBC: 5.5 10*3/uL (ref 3.8–10.8)

## 2022-12-19 LAB — COMPLETE METABOLIC PANEL WITH GFR
AG Ratio: 1.4 (calc) (ref 1.0–2.5)
ALT: 15 U/L (ref 9–46)
AST: 18 U/L (ref 10–35)
Albumin: 3.8 g/dL (ref 3.6–5.1)
Alkaline phosphatase (APISO): 91 U/L (ref 35–144)
BUN/Creatinine Ratio: 14 (calc) (ref 6–22)
BUN: 31 mg/dL — ABNORMAL HIGH (ref 7–25)
CO2: 25 mmol/L (ref 20–32)
Calcium: 8.8 mg/dL (ref 8.6–10.3)
Chloride: 102 mmol/L (ref 98–110)
Creat: 2.2 mg/dL — ABNORMAL HIGH (ref 0.70–1.30)
Globulin: 2.7 g/dL (calc) (ref 1.9–3.7)
Glucose, Bld: 328 mg/dL — ABNORMAL HIGH (ref 65–99)
Potassium: 4.4 mmol/L (ref 3.5–5.3)
Sodium: 136 mmol/L (ref 135–146)
Total Bilirubin: 0.5 mg/dL (ref 0.2–1.2)
Total Protein: 6.5 g/dL (ref 6.1–8.1)
eGFR: 35 mL/min/{1.73_m2} — ABNORMAL LOW (ref >=60)

## 2022-12-19 LAB — URIC ACID: Uric Acid, Serum: 5.2 mg/dL (ref 4.0–8.0)

## 2022-12-19 MED ORDER — CARVEDILOL 25 MG PO TABS: 25.0000 mg | ORAL_TABLET | Freq: Two times a day (BID) | ORAL | 0 refills | Status: DC

## 2022-12-19 MED ORDER — EZETIMIBE 10 MG PO TABS: 10.0000 mg | ORAL_TABLET | Freq: Every day | ORAL | 3 refills | Status: AC

## 2022-12-19 NOTE — Telephone Encounter (Signed)
Called patient's wife to discuss medication question. She was not sure if patient should start Januvia or not.  Reviewed last office visit with PCP. Caleen Jobs, NP did want patient to restart Januvia. Reviewed recent Scr / eGFR results.  eGFR was 35 (for AA patient 38) when checked yesterday.  Januvia '50mg'$  daily is appropriate for eGFR 30 to 45 mL/min  If eGFR falls below 30 then adjust dose of Januvia to '25mg'$   Patient's wife asked that I call him to discuss meds and Levemir.

## 2022-12-19 NOTE — Progress Notes (Signed)
Patient remains anemic likely secondary to CKD.  Creatinine remains elevted-2.20 and GFR is low at 35.  Avoid the use of NSAIDs.  Forward results to Dr. Moshe Cipro.   Glucose is very elevated at 328.  Please advise patient to monitor glucose closely.   Uric acid is WNL-5.2.  continue taking allopurinol 300 mg 1 tablet by mouth daily.avoid a purine rich diet.

## 2022-12-19 NOTE — Progress Notes (Signed)
12/19/2022 Name: Matthew Fisher MRN: [id number] DOB: [birth year]  Chief Complaint  Patient presents with   Diabetes    Follow up   Hyperlipidemia    Follow up   Medication Management    Follow up    Hypertension    Follow up    Matthew Fisher is a 52 y.o. year old male who presented for a telephone visit. He was not available so spoke with his wife regarding medications.    They were referred to the pharmacist by their PCP for assistance in managing diabetes, hypertension, hyperlipidemia, medication access, and complex medication management.    Subjective:  Medication Access/Adherence Current Pharmacy:  [id number] - [place], [address] [place] [address] Phone: [phone] Fax: [phone]  Patient reports affordability concerns with their medications: Yes  Patient reports access/transportation concerns to their pharmacy: No  Patient reports adherence concerns with their medications:  Yes  forgets to take Levemir sometimes at night and cost of Januvia was prohibitive. Cost has improved since year started over.    Diabetes: Current medications:   Levemir 14 units daily - increase by 2 units every 3 to 4 days until blood glucose < 130. (Patient states he is only taking when he knows that he has eaten too much and feels that blood glucose will be high). He states that sometimes when he takes Levemir blood glucose will drop too low.  Farxiga '10mg'$  daily.  Januvia '50mg'$  daily - has not taken in several months due to cost in coverage gap. Just restarted by PCP 12/13/2022 but patient has not picked up yet.  Current glucose readings: patient is not checking blood glucose regularly. I provided Continuous Glucose Monitor Libre 3 sensor about 1 month ago but patient has not started. He has used Libre 2 in the past.   Hypertension: Current medications: lisinopril '40mg'$  daily (increased last week), amlodipine '10mg'$  daily,  carvedilol '25mg'$  daily  BP Readings from Last 3 Encounters:  12/18/22 (!) 179/86  12/10/22 (!) 143/78  11/05/22 (!) 154/84    Hyperlipidemia/ASCVD Risk Reduction Current lipid lowering medications:  atorvastatin '80mg'$  daily and ezetimibe '10mg'$  daily   LDL has improved from 167 to 107 (12/2022) since started atorvastatin '80mg'$  daily.  Medication Management: Current adherence strategy: none.  Per wife patient does not know about his medications nor what they are all used for.  Patient reports Good adherence to medications but refill history shows some late refills.   Barriers to adherence:  Multiple comorbidities Complex medication regimen Poor knowledge of the illness and medication. Independent pausing, stopping or controlling of the medication. Lack of knowledge and confidence in self-management. Low understanding / confidence in medications benefits.   Recent fill dates:  noted that the following maintenance medications have not been filled regularly Januvia - last refill was 12/13/2022 - 30 days (has not picked up yet) Amlodipine - last refill was 07/15/2022 for 90 days Carvedilol '25mg'$  - last refill was 07/15/2022 for 90 days.  Levemir - last refill was 01/27/2022 for 15 mL Atorvastatin - last refill was 03/07/204 - 90 days Ezetimibe '10mg'$  - last refill was 09/13/2022 - 90 days   Objective:  Lab Results  Component Value Date   HGBA1C 7.8 (H) 12/10/2022    Lab Results  Component Value Date   CREATININE 2.20 (H) 12/18/2022   BUN 31 (H) 12/18/2022   NA 136 12/18/2022   K 4.4 12/18/2022   CL 102 12/18/2022  CO2 25 12/18/2022    Lab Results  Component Value Date   CHOL 172 12/10/2022   HDL 44.30 12/10/2022   LDLCALC 107 (H) 12/10/2022   TRIG 102.0 12/10/2022   CHOLHDL 4 12/10/2022    Medications Reviewed Today     Reviewed by Cherre Robins, RPH-CPP (Pharmacist) on 12/19/22 at 45  Med List Status: <None>   Medication Order Taking? Sig Documenting Provider  Last Dose Status Informant  acetaminophen (TYLENOL) 325 MG tablet [id number]  Take by mouth. [provider]  Active   allopurinol (ZYLOPRIM) 300 MG tablet [id number] Yes Take 1 tablet (300 mg total) by mouth daily. Bo Merino, MD Taking Active   amLODipine (NORVASC) 10 MG tablet [id number] Yes Take 1 tablet (10 mg total) by mouth daily. Terrilyn Saver, NP Taking Active   aspirin 81 MG chewable tablet [id number] Yes Chew 1 tablet (81 mg total) by mouth daily. Rise Mu, PA-C Taking Active Self  atorvastatin (LIPITOR) 80 MG tablet [id number] Yes Take 1 tablet (80 mg total) by mouth daily. Terrilyn Saver, NP Taking Active   blood glucose meter kit and supplies KIT [id number] No Dispense based on patient and insurance preference. Use up to four times daily as directed. Please include lancets, test strips, control solution.  Patient not taking: Reported on 12/19/2022   Terrilyn Saver, NP Not Taking Active   carvedilol (COREG) 25 MG tablet [id number]  Take 1 tablet (25 mg total) by mouth 2 (two) times daily with a meal. Terrilyn Saver, NP  Active   cholecalciferol 25 MCG (1000 UT) tablet [id number]  Take by mouth. [provider]  Active   colchicine 0.6 MG tablet [id number]  Take by mouth. [provider]  Active   dapagliflozin propanediol (FARXIGA) 10 MG TABS tablet [id number] Yes Take 1 tablet (10 mg total) by mouth daily. Terrilyn Saver, NP Taking Active   diclofenac Sodium (VOLTAREN) 1 % GEL [id number]  Apply topically. [provider]  Active   ezetimibe (ZETIA) 10 MG tablet [id number] Yes Take 1 tablet (10 mg total) by mouth daily. Park Liter, MD Taking Active   ferrous sulfate 325 (65 FE) MG tablet [id number] Yes Take 1 tablet (325 mg total) by mouth every morning. Terrilyn Saver, NP Taking Active   insulin detemir (LEVEMIR FLEXTOUCH) 100 UNIT/ML FlexPen [id number]  Inject 10 Units into the skin at bedtime. Start with 10 units every night. Titrate up by 2  units every 3 days if morning/fasting glucose is higher than 130 consistently.  Patient not taking: Reported on 12/10/2022   Caleen Jobs B, NP  Active   Insulin Pen Needle (PEN NEEDLES) 32G X 4 MM MISC [id number] No 1 each by Does not apply route daily.  Patient not taking: Reported on 12/10/2022   Terrilyn Saver, NP Not Taking Active   lisinopril (ZESTRIL) 40 MG tablet [id number] Yes Take 1 tablet (40 mg total) by mouth daily. Terrilyn Saver, NP Taking Active   nitroGLYCERIN (NITROSTAT) 0.4 MG SL tablet [id number]  Place 1 tablet (0.4 mg total) under the tongue every 5 (five) minutes as needed for chest pain. Terrilyn Saver, NP  Active            Med Note (CANTER, KAYLYN D   Tue Jun 12, 2022  9:25 AM) PRN  pantoprazole (PROTONIX) 40 MG tablet [id number] Yes Take 1 tablet (40 mg total) by mouth 2 (two) times daily. Terrilyn Saver, NP Taking Active  sitaGLIPtin (JANUVIA) 50 MG tablet [id number] No Take 1 tablet (50 mg total) by mouth daily.  Patient not taking: Reported on 12/18/2022   Terrilyn Saver, NP Not Taking Active   tamsulosin [facility]) 0.4 MG CAPS capsule [id number]  Take 0.4 mg by mouth. [provider]  Active               Assessment/Plan:   Diabetes: not at goal of A1c < 7.0% but A1c has improved to 7.8 %over the last 6 months.  - Reviewed goal A1c, goal fasting, and goal 2 hour post prandial glucose - Assisted patient in downloading [facility] 3 app to his phone. Connected with patient to our practice on his app. Discussed vitamin C limit of '500mg'$  per day. Patient to place Continuous Glucose Monitor when his wife gets home to help him. He has used the [place] 2 before so is aware how to place sensor and proper sites to use. - Levemir will be discontinued in 2024 but he states he has about 10 pens on hand. Discussed that Levemir and long acting insulins should be taken every day.  Will evaluate his Continuous Glucose Monitor report in 7 to 10 days to make changes as needed. Plan to  eventually change to [place] since Levemir will to discontinued soon.  Celesta Gentile and Wilder Glade will both be $47 per month and patient will reach coverage gap around May and cost of each will increase to around $150 / month. Discussed patient assistance programs for [place]. Patient wants to hold off until he knows he can tolerate both meds.   Hypertension: not at goal of < 130/80 for CKD + DM - Reviewed long term cardiovascular and renal outcomes of uncontrolled blood pressure - continue to take lisinopril '40mg'$  daily, amlodipine '10mg'$  daily and carvedilol '25mg'$  twice a day   Hyperlipidemia/ASCVD Risk Reduction: not at goal of LDL < 70 - Recommend to continue atorvastatin '80mg'$  daily and ezetimibe '10mg'$  daily - will continue to follow lipids and adherence   Medication Management: - Currently strategy insufficient to maintain appropriate adherence to prescribed medication regimen - Suggested use of weekly pill box to organize medications - Created list of medication, indication, and administration time. Mailed to patient / wife after our appt in January 2024.  -Working with patient on medication costs to improve adherence.  All meds will be $0 for 2024 except Farxiga $47/mo, Januvia $47/mo (until coverage gap) Insulin will be $35/mo all year. - Provided applications for Januvia and Farxiga patient assistance programs after our January 2024 appointment - patient would like to hold off until he knows he will continue both medications.   Meds ordered this encounter  Medications   ezetimibe (ZETIA) 10 MG tablet    Sig: Take 1 tablet (10 mg total) by mouth daily.    Dispense:  90 tablet    Refill:  3   carvedilol (COREG) 25 MG tablet    Sig: Take 1 tablet (25 mg total) by mouth 2 (two) times daily with a meal.    Dispense:  90 tablet    Refill:  0     Follow Up Plan: 7 to 10 days  Cherre Robins, PharmD Clinical Pharmacist [facility]

## 2022-12-19 NOTE — Telephone Encounter (Signed)
Pt's wife is calling back to speak with Tammy.

## 2022-12-24 ENCOUNTER — Telehealth: Payer: Self-pay | Admitting: Family Medicine

## 2022-12-24 NOTE — Telephone Encounter (Signed)
Pt's wife was wondering if there was an earlier or later appt time on 3/20 that they could do instead.

## 2022-12-24 NOTE — Telephone Encounter (Signed)
Moved phone appointment to 8:30am - LM on VM of patinet's wife about new appointment time.

## 2022-12-26 ENCOUNTER — Ambulatory Visit (INDEPENDENT_AMBULATORY_CARE_PROVIDER_SITE_OTHER): Payer: Medicare Other | Admitting: Pharmacist

## 2022-12-26 ENCOUNTER — Telehealth: Payer: Medicare Other

## 2022-12-26 DIAGNOSIS — E1165 Type 2 diabetes mellitus with hyperglycemia: Secondary | ICD-10-CM

## 2022-12-26 DIAGNOSIS — E782 Mixed hyperlipidemia: Secondary | ICD-10-CM

## 2022-12-26 DIAGNOSIS — I10 Essential (primary) hypertension: Secondary | ICD-10-CM

## 2022-12-26 NOTE — Progress Notes (Signed)
12/26/2022 Name: Matthew Fisher MRN: [id number] DOB: 05/09/1971  No chief complaint on file.   Matthew Fisher is a 52 y.o. year old male who presented for a telephone visit. Today's phone visit with with patient and his wife.    They were referred to the pharmacist by their PCP for assistance in managing diabetes, hypertension, hyperlipidemia, medication access, and complex medication management.    Subjective:  Medication Access/Adherence Current Pharmacy:  [id number] - [place], [address] [place] [address] Phone: [phone] Fax: [phone]  Patient reports affordability concerns with their medications: Yes  Patient reports access/transportation concerns to their pharmacy: No  Patient reports adherence concerns with their medications:  Yes   cost of Januvia was prohibitive. Cost has improved since year started over.    Diabetes: Current medications:   Levemir 14 units daily - increase by 2 units every 3 to 4 days until blood glucose < 130. (Not taking every day because he is worried it will drop blood glucose too much).  Januvia 50mg  daily - had not taken in several months due to cost in coverage gap. Just rescribed by PCP 12/13/2022 but patient has not started taking yet. Farxiga prescribed but patient has not started yet.   Current glucose readings: patient is not checking blood glucose regularly. I provided Continuous Glucose Monitor Libre 3 sensor about 1 month ago. He put sensor last week but he was not able to get it to connect to his phone.   Hypertension: Current medications: lisinopril 40mg  daily (increased 2 weeks ago), amlodipine 10mg  daily, carvedilol 25mg  daily  Patient is checkign blood pressure at home - recently readings have been in 140's / 80's  BP Readings from Last 3 Encounters:  12/18/22 (!) 179/86  12/10/22 (!) 143/78  11/05/22 (!) 154/84    Hyperlipidemia/ASCVD Risk Reduction Current  lipid lowering medications:  atorvastatin 80mg  daily and ezetimibe 10mg  daily   LDL has improved from 167 to 107 (12/2022) since started atorvastatin 80mg  daily.  Medication Management: Current adherence strategy: none.  Patient reports Good adherence to medications but refill history shows some late refills.   Barriers to adherence:  Multiple comorbidities Complex medication regimen Poor knowledge of the illness and medication. Independent pausing, stopping or controlling of the medication. Lack of knowledge and confidence in self-management. Low understanding / confidence in medications benefits.   Recent fill dates:  noted that the following maintenance medications have not been filled regularly Januvia - last refill was 12/13/2022 - 30 days (has not started yet) Amlodipine - last refill was 07/15/2022 for 90 days Carvedilol 25mg  - last refill was 12/20/2022 - 45 days Levemir - last refill was 01/27/2022 for 15 mL Atorvastatin - last refill was 03/07/204 - 90 days Ezetimibe 10mg  - last refill was 12/20/2022 - 90 days   Objective:  Lab Results  Component Value Date   HGBA1C 7.8 (H) 12/10/2022    Lab Results  Component Value Date   CREATININE 2.20 (H) 12/18/2022   BUN 31 (H) 12/18/2022   NA 136 12/18/2022   K 4.4 12/18/2022   CL 102 12/18/2022   CO2 25 12/18/2022    Lab Results  Component Value Date   CHOL 172 12/10/2022   HDL 44.30 12/10/2022   LDLCALC 107 (H) 12/10/2022   TRIG 102.0 12/10/2022   CHOLHDL 4 12/10/2022    Medications Reviewed Today     Reviewed by Cherre Robins, RPH-CPP (Pharmacist) on 12/26/22 at  0844  Med List Status: <None>   Medication Order Taking? Sig Documenting Provider Last Dose Status Informant  acetaminophen (TYLENOL) 325 MG tablet [id number] Yes Take by mouth. [provider] Taking Active   allopurinol (ZYLOPRIM) 300 MG tablet [id number] Yes Take 1 tablet (300 mg total) by mouth daily. Bo Merino, MD Taking Active    amLODipine (NORVASC) 10 MG tablet [id number] Yes Take 1 tablet (10 mg total) by mouth daily. Terrilyn Saver, NP Taking Active   aspirin 81 MG chewable tablet [id number] Yes Chew 1 tablet (81 mg total) by mouth daily. Rise Mu, PA-C Taking Active Self  atorvastatin (LIPITOR) 80 MG tablet [id number] Yes Take 1 tablet (80 mg total) by mouth daily. Terrilyn Saver, NP Taking Active   blood glucose meter kit and supplies KIT [id number] No Dispense based on patient and insurance preference. Use up to four times daily as directed. Please include lancets, test strips, control solution.  Patient not taking: Reported on 12/26/2022   Terrilyn Saver, NP Not Taking Active   carvedilol (COREG) 25 MG tablet [id number] Yes Take 1 tablet (25 mg total) by mouth 2 (two) times daily with a meal. Mosie Lukes, MD Taking Active   cholecalciferol 25 MCG (1000 UT) tablet [id number] Yes Take by mouth. [provider] Taking Active   colchicine 0.6 MG tablet [id number] Yes Take by mouth. [provider] Taking Active   dapagliflozin propanediol (FARXIGA) 10 MG TABS tablet [id number] No Take 1 tablet (10 mg total) by mouth daily.  Patient not taking: Reported on 12/26/2022   Terrilyn Saver, NP Not Taking Active   diclofenac Sodium (VOLTAREN) 1 % GEL [id number] Yes Apply topically. [provider] Taking Active   ezetimibe (ZETIA) 10 MG tablet [id number] Yes Take 1 tablet (10 mg total) by mouth daily. Mosie Lukes, MD Taking Active   ferrous sulfate 325 (65 FE) MG tablet [id number] Yes Take 1 tablet (325 mg total) by mouth every morning. Terrilyn Saver, NP Taking Active   insulin detemir (LEVEMIR FLEXTOUCH) 100 UNIT/ML FlexPen [id number] Yes Inject 10 Units into the skin at bedtime. Start with 10 units every night. Titrate up by 2 units every 3 days if morning/fasting glucose is higher than 130 consistently. Terrilyn Saver, NP Taking Active   Insulin Pen Needle (PEN NEEDLES) 32G X 4 MM MISC [id number] Yes  1 each by Does not apply route daily. Terrilyn Saver, NP Taking Active   lisinopril (ZESTRIL) 40 MG tablet [id number] Yes Take 1 tablet (40 mg total) by mouth daily. Terrilyn Saver, NP Taking Active   nitroGLYCERIN (NITROSTAT) 0.4 MG SL tablet [id number] Yes Place 1 tablet (0.4 mg total) under the tongue every 5 (five) minutes as needed for chest pain. Terrilyn Saver, NP Taking Active            Med Note (CANTER, KAYLYN D   Tue Jun 12, 2022  9:25 AM) PRN  pantoprazole (PROTONIX) 40 MG tablet [id number] Yes Take 1 tablet (40 mg total) by mouth 2 (two) times daily. Terrilyn Saver, NP Taking Active   sitaGLIPtin (JANUVIA) 50 MG tablet [id number] No Take 1 tablet (50 mg total) by mouth daily.  Patient not taking: Reported on 12/26/2022   Terrilyn Saver, NP Not Taking Active   tamsulosin [facility]) 0.4 MG CAPS capsule [id number] Yes Take 0.4 mg by mouth. [provider] Taking Active  Assessment/Plan:   Diabetes: not at goal of A1c < 7.0% but A1c has improved to 7.8 %over the last 6 months.  - Assisted patient connecting [place] 3 sensor and phone. Confirmed that they are linked.  - Levemir will be discontinued in 2024 but he states he has about 10 pens on hand. Reminded  that Levemir and long acting insulins should be taken every day.  Will evaluate his Continuous Glucose Monitor report in 7 to 10 days to make changes as needed. Plan to eventually change to [place] since Levemir will to discontinued soon.  - Encouraged him to start [place] today.  Both will be $47 per month and patient will reach coverage gap around May, then cost of each will increase to around $150 / month.  Due to patient having another appointment we did not do [place] medication assistance program but plan to do when follow up on Friday 12/28/2022  Hypertension: not at goal of < 130/80 for CKD + DM - Reviewed long term cardiovascular and renal outcomes of uncontrolled blood pressure - continue to  take lisinopril 40mg  daily, amlodipine 10mg  daily and carvedilol 25mg  twice a day - continue to check blood pressure at home 2 to 3 times per week and record.  - Discussed that Wilder Glade could help with blood pressure a little as well. Will continue to assess blood pressure control.    Hyperlipidemia/ASCVD Risk Reduction: not at goal of LDL < 70 - Recommend to continue atorvastatin 80mg  daily and ezetimibe 10mg  daily - will continue to follow lipids and adherence   Medication Management: - Currently strategy insufficient to maintain appropriate adherence to prescribed medication regimen - Suggested use of weekly pill box to organize medications - Created list of medication, indication, and administration time. Mailed to patient / wife after our appt in January 2024.  -Working with patient on medication costs to improve adherence.  All meds will be $0 for 2024 except Farxiga $47/mo, Januvia $47/mo (until coverage gap) Insulin will be $35/mo all year. - Provided applications for Januvia and Farxiga patient assistance programs after our January 2024 appointment.Plan to try to apply online for Farxiga medication assistance program 12/28/2022   Follow Up Plan: 2 days  Cherre Robins, PharmD Clinical Pharmacist [facility]

## 2022-12-27 ENCOUNTER — Other Ambulatory Visit (INDEPENDENT_AMBULATORY_CARE_PROVIDER_SITE_OTHER): Payer: Medicare Other

## 2022-12-27 ENCOUNTER — Ambulatory Visit (INDEPENDENT_AMBULATORY_CARE_PROVIDER_SITE_OTHER): Payer: Medicare Other | Admitting: Family Medicine

## 2022-12-27 VITALS — BP 148/86 | HR 83

## 2022-12-27 DIAGNOSIS — I10 Essential (primary) hypertension: Secondary | ICD-10-CM | POA: Diagnosis not present

## 2022-12-27 LAB — BASIC METABOLIC PANEL
BUN: 20 mg/dL (ref 6–23)
CO2: 26 mEq/L (ref 19–32)
Calcium: 8.6 mg/dL (ref 8.4–10.5)
Chloride: 104 mEq/L (ref 96–112)
Creatinine, Ser: 2.52 mg/dL — ABNORMAL HIGH (ref 0.40–1.50)
GFR: 28.38 mL/min — ABNORMAL LOW (ref >60.00)
Glucose, Bld: 249 mg/dL — ABNORMAL HIGH (ref 70–99)
Potassium: 4.2 mEq/L (ref 3.5–5.1)
Sodium: 136 mEq/L (ref 135–145)

## 2022-12-27 NOTE — Progress Notes (Signed)
Pt here for Blood pressure check per PCP order.  Pt currently takes: amlodipine 10mg , coreg 25mg , lisinopril 40mg    Pt reports compliance with medication.  BP Readings from Last 3 Encounters:  12/18/22 (!) 179/86  12/10/22 (!) 143/78  11/05/22 (!) 154/84     BP today 905 AM left arm 146/86  HR 82 907 am right arm 158/86 HR 83 911am left arm 150/82  HR 83 913 am right arm 148/86  HR 83   Pt advised per Lovena Le that we are going to refer to blood pressure clinic.  Also advised to get bp cuff for home to check.

## 2022-12-28 ENCOUNTER — Other Ambulatory Visit: Payer: Medicare Other | Admitting: Pharmacist

## 2022-12-28 DIAGNOSIS — E1165 Type 2 diabetes mellitus with hyperglycemia: Secondary | ICD-10-CM

## 2022-12-28 DIAGNOSIS — I2510 Atherosclerotic heart disease of native coronary artery without angina pectoris: Secondary | ICD-10-CM

## 2022-12-28 DIAGNOSIS — E782 Mixed hyperlipidemia: Secondary | ICD-10-CM

## 2022-12-28 DIAGNOSIS — I10 Essential (primary) hypertension: Secondary | ICD-10-CM

## 2022-12-28 MED ORDER — DAPAGLIFLOZIN PROPANEDIOL 10 MG PO TABS: 10.0000 mg | ORAL_TABLET | Freq: Every day | ORAL | 3 refills | Status: DC

## 2022-12-28 NOTE — Progress Notes (Signed)
12/28/2022 Name: Matthew Fisher MRN: [id number] DOB: [birth year]  Chief Complaint  Patient presents with   Medication Management   Diabetes   Hypertension    Matthew Fisher is a 51 y.o. year old male who presented for a telephone visit. Today's phone visit with with patient and his wife.    They were referred to the pharmacist by their PCP for assistance in managing diabetes, hypertension, hyperlipidemia, medication access, and complex medication management.    Subjective:  Medication Access/Adherence Current Pharmacy:  [id number] - [place], [place] [place] [address] Phone: [phone] Fax: [phone]  Patient reports affordability concerns with their medications: Yes  Patient reports access/transportation concerns to their pharmacy: No  Patient reports adherence concerns with their medications:  Yes   cost of Januvia was prohibitive. Cost has improved since year started over.    Diabetes: Current medications:   Levemir 14 units daily - increase by 2 units every 3 to 4 days until blood glucose < 130. Patient has not taken consistently. See Continuous Glucose Monitor report - when he took 10 units of levemir 12/27/2022 prior to office visit - blood glucose dropped to 68.  Januvia 50mg  daily - had not taken in several months due to cost in coverage gap. Just restarted 12/26/2022.  Farxiga 10mg  daily started 12/26/2022  Current glucose readings: See Continuous Glucose Monitor report below.    Hypertension: Current medications: lisinopril 40mg  daily, amlodipine 10mg  daily, carvedilol 25mg  daily  Patient is checking blood pressure at home - recent readings have been in 140's / 80's  BP Readings from Last 3 Encounters:  12/27/22 (!) 148/86  12/18/22 (!) 179/86  12/10/22 (!) 143/78    Hyperlipidemia/ASCVD Risk Reduction Current lipid lowering medications:  atorvastatin 80mg  daily and ezetimibe 10mg  daily    LDL has improved from 167 to 107 (12/2022) since started atorvastatin 80mg  daily.  Medication Management: Current adherence strategy: none.  Patient reports  improving  adherence to medications. Refill history shows some late refills.   Barriers to adherence:  Multiple comorbidities Complex medication regimen Poor knowledge of the illness and medication. Independent pausing, stopping or controlling of the medication. Lack of knowledge and confidence in self-management. Low understanding / confidence in medications benefits.   Recent fill dates:  noted that the following maintenance medications have not been filled regularly Januvia - last refill was 12/13/2022 - 30 days Amlodipine - last refill was 07/15/2022 for 90 days Carvedilol 25mg  - last refill was 12/20/2022 - 45 days Levemir - last refill was 01/27/2022 for 15 mL Atorvastatin - last refill was 03/07/204 - 90 days Ezetimibe 10mg  - last refill was 12/20/2022 - 90 days   Objective:  Continuous Glucose Monitor - Freestyle Libre 3 report:         Lab Results  Component Value Date   HGBA1C 7.8 (H) 12/10/2022    Lab Results  Component Value Date   CREATININE 2.52 (H) 12/27/2022   BUN 20 12/27/2022   NA 136 12/27/2022   K 4.2 12/27/2022   CL 104 12/27/2022   CO2 26 12/27/2022    Lab Results  Component Value Date   CHOL 172 12/10/2022   HDL 44.30 12/10/2022   LDLCALC 107 (H) 12/10/2022   TRIG 102.0 12/10/2022   CHOLHDL 4 12/10/2022    Medications Reviewed Today     Reviewed by Cherre Robins, RPH-CPP (Pharmacist) on 12/28/22 at 0857  Med List Status: <None>  Medication Order Taking? Sig Documenting Provider Last Dose Status Informant  acetaminophen (TYLENOL) 325 MG tablet [id number] Yes Take by mouth. [provider] Taking Active   allopurinol (ZYLOPRIM) 300 MG tablet [id number] Yes Take 1 tablet (300 mg total) by mouth daily. Bo Merino, MD Taking Active   amLODipine (NORVASC) 10 MG  tablet [id number] Yes Take 1 tablet (10 mg total) by mouth daily. Terrilyn Saver, NP Taking Active   aspirin 81 MG chewable tablet [id number] Yes Chew 1 tablet (81 mg total) by mouth daily. Rise Mu, PA-C Taking Active Self  atorvastatin (LIPITOR) 80 MG tablet [id number] Yes Take 1 tablet (80 mg total) by mouth daily. Terrilyn Saver, NP Taking Active   blood glucose meter kit and supplies KIT [id number] Yes Dispense based on patient and insurance preference. Use up to four times daily as directed. Please include lancets, test strips, control solution. Terrilyn Saver, NP Taking Active   carvedilol (COREG) 25 MG tablet [id number] Yes Take 1 tablet (25 mg total) by mouth 2 (two) times daily with a meal. Mosie Lukes, MD Taking Active   cholecalciferol 25 MCG (1000 UT) tablet [id number] Yes Take by mouth. [provider] Taking Active   dapagliflozin propanediol (FARXIGA) 10 MG TABS tablet [id number] Yes Take 1 tablet (10 mg total) by mouth daily. Terrilyn Saver, NP Taking Active   diclofenac Sodium (VOLTAREN) 1 % GEL [id number]  Apply topically. [provider]  Active   ezetimibe (ZETIA) 10 MG tablet [id number] Yes Take 1 tablet (10 mg total) by mouth daily. Mosie Lukes, MD Taking Active   ferrous sulfate 325 (65 FE) MG tablet [id number] Yes Take 1 tablet (325 mg total) by mouth every morning. Terrilyn Saver, NP Taking Active   insulin detemir (LEVEMIR FLEXTOUCH) 100 UNIT/ML FlexPen [id number] Yes Inject 10 Units into the skin at bedtime. Start with 10 units every night. Titrate up by 2 units every 3 days if morning/fasting glucose is higher than 130 consistently. Terrilyn Saver, NP Taking Active   Insulin Pen Needle (PEN NEEDLES) 32G X 4 MM MISC [id number] Yes 1 each by Does not apply route daily. Terrilyn Saver, NP Taking Active   lisinopril (ZESTRIL) 40 MG tablet [id number] Yes Take 1 tablet (40 mg total) by mouth daily. Terrilyn Saver, NP Taking Active   nitroGLYCERIN (NITROSTAT) 0.4  MG SL tablet [id number] Yes Place 1 tablet (0.4 mg total) under the tongue every 5 (five) minutes as needed for chest pain. Terrilyn Saver, NP Taking Active            Med Note (CANTER, KAYLYN D   Tue Jun 12, 2022  9:25 AM) PRN  pantoprazole (PROTONIX) 40 MG tablet [id number] Yes Take 1 tablet (40 mg total) by mouth 2 (two) times daily. Terrilyn Saver, NP Taking Active   sitaGLIPtin (JANUVIA) 50 MG tablet [id number] Yes Take 1 tablet (50 mg total) by mouth daily. Terrilyn Saver, NP Taking Active            Med Note Unk Lightning   Fri Dec 28, 2022  8:56 AM) Patient stated taking 12/26/2022  tamsulosin (FLOMAX) 0.4 MG CAPS capsule [id number]  Take 0.4 mg by mouth. [provider]  Active               Assessment/Plan:   Diabetes: not at goal of A1c < 7.0% but A1c has improved to 7.8 % over the last 6 months. Continuous Glucose  Monitor report showed very high blood glucose until he took Levemir dose x 1 and started [place]. Had hypoglycemia after Levemir dose.  - Reviewed Continuous Glucose Monitor report with patient.  - hold Levemir since starting oral medications to lower blood glucose. Continue  [place].  - Assisted patient with applying for medication assistance program for farxiga on line - approved and should receive delivery in 7 to 10 business days.   - Encouraged patient to complete application for Januvia medication assistance program. This one has to be mailed so he will bring to office whne completed.   Hypertension: not at goal of < 130/80 for CKD + DM but blood pressure had improved when checked yesterday in our office.  - Reviewed long term cardiovascular and renal outcomes of uncontrolled blood pressure - continue to take lisinopril 40mg  daily, amlodipine 10mg  daily and carvedilol 25mg  twice a day - continue to check blood pressure at home 2 to 3 times per week and record.  Wilder Glade could help with blood pressure a little as well. Will  continue to assess blood pressure control.  - PCP sent referral to hypertension clinic yesterday.    Hyperlipidemia/ASCVD Risk Reduction: not at goal of LDL < 70 - Recommend to continue atorvastatin 80mg  daily and ezetimibe 10mg  daily - will continue to follow lipids and adherence   Medication Management: - Currently strategy insufficient to maintain appropriate adherence to prescribed medication regimen - Suggested use of weekly pill box to organize medications. Wife will continue to assist with medication administration.  - Created list of medication, indication, and administration time. Mailed to patient / wife after our appt in January 2024.  - All meds will be $0 for 2024 except Farxiga $47/mo, Januvia $47/mo (until coverage gap) Insulin will be $35/mo all year. Approved for medication assistance program for Farxiag thru 10/08/2023. Patient has application for Januvia medication assistance program.   Follow Up Plan: 2 weeks.   Cherre Robins, PharmD Clinical Pharmacist [facility]

## 2023-01-03 DIAGNOSIS — E113513 Type 2 diabetes mellitus with proliferative diabetic retinopathy with macular edema, bilateral: Secondary | ICD-10-CM | POA: Diagnosis not present

## 2023-01-03 DIAGNOSIS — H35353 Cystoid macular degeneration, bilateral: Secondary | ICD-10-CM | POA: Diagnosis not present

## 2023-01-03 DIAGNOSIS — H35372 Puckering of macula, left eye: Secondary | ICD-10-CM | POA: Diagnosis not present

## 2023-01-09 ENCOUNTER — Ambulatory Visit (INDEPENDENT_AMBULATORY_CARE_PROVIDER_SITE_OTHER): Payer: Medicare Other | Admitting: Pharmacist

## 2023-01-09 DIAGNOSIS — E1122 Type 2 diabetes mellitus with diabetic chronic kidney disease: Secondary | ICD-10-CM

## 2023-01-09 DIAGNOSIS — I10 Essential (primary) hypertension: Secondary | ICD-10-CM

## 2023-01-09 DIAGNOSIS — Z794 Long term (current) use of insulin: Secondary | ICD-10-CM

## 2023-01-09 DIAGNOSIS — I2510 Atherosclerotic heart disease of native coronary artery without angina pectoris: Secondary | ICD-10-CM

## 2023-01-09 MED ORDER — FREESTYLE LIBRE 3 SENSOR MISC: 5 refills | Status: DC

## 2023-01-09 NOTE — Progress Notes (Signed)
01/09/2023 Name: Matthew Fisher MRN: [id number] DOB: 06/28/1971  Chief Complaint  Patient presents with   Medication Management   Hyperlipidemia   Diabetes   Hypertension    Matthew Fisher is a 52 y.o. year old male who presented for a telephone visit. Today's phone visit with with patient and his wife.    They were referred to the pharmacist by their PCP for assistance in managing diabetes, hypertension, hyperlipidemia, medication access, and complex medication management.    Subjective:  Medication Access/Adherence Current Pharmacy:  [facility] [address] [place], [place] [address] Phone: [phone] Fax: [phone]  [facility] [place], [place] [address] [place] [address] Phone: [phone] Fax: [phone]  Patient reports affordability concerns with their medications: Yes  Patient reports access/transportation concerns to their pharmacy: No  Patient reports adherence concerns with their medications:  Yes   cost of Januvia was prohibitive. Cost has improved since Medicare benefits started over at beginning of 2024.     Diabetes: Current medications:   Levemir 14 units daily - increase by 2 units every 3 to 4 days until blood glucose < 130. Patient stopped due to low blood glucose.  See Continuous Glucose Monitor report - when he took 10 units of levemir 12/27/2022 prior to office visit - blood glucose dropped to 68.   Januvia 50mg  daily -restarted 12/26/2022.  Farxiga 10mg  daily started 12/26/2022  Current glucose readings: See Continuous Glucose Monitor report below.    Hypertension: Current medications: lisinopril 40mg  daily, amlodipine 10mg  daily, carvedilol 25mg  daily  Patient is checking blood pressure at home but he is not sure his current blood pressure monitor is accurate. He was planning to purchase a new blood pressure monitor with his [facility]  over-the-counter benefits this month.   BP Readings from Last 3 Encounters:  12/27/22 (!) 148/86  12/18/22 (!) 179/86  12/10/22 (!) 143/78    Hyperlipidemia/ASCVD Risk Reduction Current lipid lowering medications:  atorvastatin 80mg  daily and ezetimibe 10mg  daily   LDL has improved from 167 to 107 (12/2022) since started atorvastatin 80mg  daily.  Medication Management: Current adherence strategy: wife has taken over giving medications. Using weekly pill container.  Patient reports  improving  adherence to medications. Refill history shows some late refills.   Barriers to adherence:  Multiple comorbidities Complex medication regimen Poor knowledge of the illness and medication. Independent pausing, stopping or controlling of the medication. Lack of knowledge and confidence in self-management. Low understanding / confidence in medications benefits.   Recent fill dates:  noted that the following maintenance medications have not been filled regularly Januvia - last refill was 12/13/2022 - 30 days Amlodipine - last refill was 07/15/2022 for 90 days - per wife patient has plenty because he had low adherence late 2023 and early 2024 but endorses that he is taking every day now. Carvedilol 25mg  - last refill was 12/20/2022 - 45 days Levemir - last refill was 01/27/2022 for 15 mL Atorvastatin - last refill was 03/07/204 - 90 days Ezetimibe 10mg  - last refill was 12/20/2022 - 90 days   Objective:  Continuous Glucose Monitor - Freestyle Libre 3 report:             Lab Results  Component Value Date   HGBA1C 7.8 (H) 12/10/2022    Lab Results  Component Value Date   CREATININE 2.52 (H) 12/27/2022  BUN 20 12/27/2022   NA 136 12/27/2022   K 4.2 12/27/2022   CL 104 12/27/2022   CO2 26 12/27/2022    Lab Results  Component Value Date   CHOL 172 12/10/2022   HDL 44.30 12/10/2022   LDLCALC 107 (H) 12/10/2022   TRIG 102.0 12/10/2022   CHOLHDL 4 12/10/2022     Medications Reviewed Today     Reviewed by Cherre Robins, RPH-CPP (Pharmacist) on 01/09/23 at [phone]  Med List Status: <None>   Medication Order Taking? Sig Documenting Provider Last Dose Status Informant  acetaminophen (TYLENOL) 325 MG tablet [id number] Yes Take by mouth. [provider] Taking Active   allopurinol (ZYLOPRIM) 300 MG tablet [id number] Yes Take 1 tablet (300 mg total) by mouth daily. Bo Merino, MD Taking Active   amLODipine (NORVASC) 10 MG tablet [id number] Yes Take 1 tablet (10 mg total) by mouth daily. Terrilyn Saver, NP Taking Active   aspirin 81 MG chewable tablet [id number] Yes Chew 1 tablet (81 mg total) by mouth daily. Rise Mu, PA-C Taking Active Self  atorvastatin (LIPITOR) 80 MG tablet [id number] Yes Take 1 tablet (80 mg total) by mouth daily. Terrilyn Saver, NP Taking Active   blood glucose meter kit and supplies KIT [id number] No Dispense based on patient and insurance preference. Use up to four times daily as directed. Please include lancets, test strips, control solution.  Patient not taking: Reported on 01/09/2023   Terrilyn Saver, NP Not Taking Active   carvedilol (COREG) 25 MG tablet [id number] Yes Take 1 tablet (25 mg total) by mouth 2 (two) times daily with a meal. Mosie Lukes, MD Taking Active   cholecalciferol 25 MCG (1000 UT) tablet [id number] Yes Take by mouth. [provider] Taking Active   dapagliflozin propanediol (FARXIGA) 10 MG TABS tablet [id number] Yes Take 1 tablet (10 mg total) by mouth daily. Mosie Lukes, MD Taking Active   diclofenac Sodium (VOLTAREN) 1 % GEL [id number]  Apply topically. [provider]  Active   ezetimibe (ZETIA) 10 MG tablet [id number] Yes Take 1 tablet (10 mg total) by mouth daily. Mosie Lukes, MD Taking Active   ferrous sulfate 325 (65 FE) MG tablet [id number] Yes Take 1 tablet (325 mg total) by mouth every morning. Terrilyn Saver, NP Taking Active   insulin detemir (LEVEMIR FLEXTOUCH)  100 UNIT/ML FlexPen [id number] No Inject 10 Units into the skin at bedtime. Start with 10 units every night. Titrate up by 2 units every 3 days if morning/fasting glucose is higher than 130 consistently.  Patient not taking: Reported on 01/09/2023   Terrilyn Saver, NP Not Taking Active   Insulin Pen Needle (PEN NEEDLES) 32G X 4 MM MISC [id number] No 1 each by Does not apply route daily.  Patient not taking: Reported on 01/09/2023   Terrilyn Saver, NP Not Taking Active   lisinopril (ZESTRIL) 40 MG tablet [id number] Yes Take 1 tablet (40 mg total) by mouth daily. Terrilyn Saver, NP Taking Active   nitroGLYCERIN (NITROSTAT) 0.4 MG SL tablet [id number] Yes Place 1 tablet (0.4 mg total) under the tongue every 5 (five) minutes as needed for chest pain. Terrilyn Saver, NP Taking Active            Med Note (CANTER, KAYLYN D   Tue Jun 12, 2022  9:25 AM) PRN  pantoprazole (PROTONIX) 40 MG tablet [id number] Yes Take 1 tablet (40 mg total) by mouth 2 (two) times daily. Caleen Jobs  B, NP Taking Active   sitaGLIPtin (JANUVIA) 50 MG tablet [id number] Yes Take 1 tablet (50 mg total) by mouth daily. Terrilyn Saver, NP Taking Active            Med Note Unk Lightning   Fri Dec 28, 2022  8:56 AM) Patient stated taking 12/26/2022  tamsulosin (FLOMAX) 0.4 MG CAPS capsule [id number] Yes Take 0.4 mg by mouth. [provider] Taking Active               Assessment/Plan:   Diabetes: not at goal of A1c < 7.0% but A1c has improved to 7.8 % over the last 6 months. Continuous Glucose Monitor report shows still > 30% of times blood glucose is > 180 - Reviewed Continuous Glucose Monitor report with patient.  - Recommended restart Levemir at lower dose of 6 units daily - Continue Januvia 50mg  daily (based on last Serum Cr, eGFR is right at cut off to adjust [place] to 25mg . Patient to see nephrologist 12/16/2022 - will check labs and adjust Januvia if needed)  - [place] and [place] to check [place] of [place] -  per program, pharmacy is working in Rx and should be shipped soon. - Provided sample of Continuous Glucose Monitor - Libre 3. Will send in Rx to see if covered by his [place].   - Provided another copy of [employer] medication assistance program. Encouraged patient to complete application for Januvia medication assistance program. This one has to be mailed so he will bring to office when completed.  - Discussed GLP agents. Patient declined Ozempic due to his friend having a bad experience with Ozempic. Sent him information about other GLP1s to review. (GLP1s would be used in place of Januvia)   Hypertension: not at goal of < 130/80 for CKD + DM but blood pressure had improved when checked lastin our office.  - Reviewed long term cardiovascular and renal outcomes of uncontrolled blood pressure - continue to take lisinopril 40mg  daily, amlodipine 10mg  daily and carvedilol 25mg  twice a day - continue to check blood pressure at home 2 to 3 times per week and record.  Wilder Glade could help with blood pressure a little as well. Will continue to assess blood pressure control.  - PCP sent referral to hypertension clinic in March 2024.    Hyperlipidemia/ASCVD Risk Reduction: not at goal of LDL < 70 - Recommend to continue atorvastatin 80mg  daily and ezetimibe 10mg  daily - will continue to follow lipids and adherence   Medication Management: - Current strategy shows improved medication adherence.  - Continue to use weekly pill box to organize medications. Wife will continue to assist with medication administration.   - All meds will be $0 for 2024 except Farxiga $47/mo, Januvia $47/mo (until coverage gap) Insulin will be $35/mo all year. Approved for medication assistance program for Farxiag thru 10/08/2023. Patient has application for Januvia medication assistance program.   Follow Up Plan: 2 weeks.   Cherre Robins, PharmD Clinical Pharmacist [facility]

## 2023-01-13 ENCOUNTER — Other Ambulatory Visit: Payer: Self-pay | Admitting: Family Medicine

## 2023-01-13 DIAGNOSIS — I509 Heart failure, unspecified: Secondary | ICD-10-CM

## 2023-01-14 ENCOUNTER — Other Ambulatory Visit: Payer: Self-pay | Admitting: Family Medicine

## 2023-01-14 DIAGNOSIS — E611 Iron deficiency: Secondary | ICD-10-CM

## 2023-01-16 DIAGNOSIS — N184 Chronic kidney disease, stage 4 (severe): Secondary | ICD-10-CM | POA: Diagnosis not present

## 2023-01-16 DIAGNOSIS — M109 Gout, unspecified: Secondary | ICD-10-CM | POA: Diagnosis not present

## 2023-01-16 DIAGNOSIS — I129 Hypertensive chronic kidney disease with stage 1 through stage 4 chronic kidney disease, or unspecified chronic kidney disease: Secondary | ICD-10-CM | POA: Diagnosis not present

## 2023-01-16 DIAGNOSIS — E11649 Type 2 diabetes mellitus with hypoglycemia without coma: Secondary | ICD-10-CM | POA: Diagnosis not present

## 2023-01-17 LAB — PROTEIN / CREATININE RATIO, URINE: Creatinine, Urine: 58.2

## 2023-01-21 ENCOUNTER — Encounter: Payer: Self-pay | Admitting: *Deleted

## 2023-01-23 ENCOUNTER — Ambulatory Visit (INDEPENDENT_AMBULATORY_CARE_PROVIDER_SITE_OTHER): Payer: Medicare Other | Admitting: Pharmacist

## 2023-01-23 DIAGNOSIS — Z794 Long term (current) use of insulin: Secondary | ICD-10-CM

## 2023-01-23 DIAGNOSIS — I10 Essential (primary) hypertension: Secondary | ICD-10-CM

## 2023-01-23 DIAGNOSIS — E1165 Type 2 diabetes mellitus with hyperglycemia: Secondary | ICD-10-CM

## 2023-01-23 MED ORDER — CARVEDILOL 25 MG PO TABS
25.0000 mg | ORAL_TABLET | Freq: Two times a day (BID) | ORAL | 0 refills | Status: DC
Start: 2023-01-23 — End: 2023-09-09

## 2023-01-23 NOTE — Progress Notes (Signed)
01/23/2023 Name: Parks Czajkowski MRN: [id number] DOB: 1970-11-18  No chief complaint on file.   Matthew Fisher is a 52 y.o. year old male who presented for a telephone visit. Today's phone visit with with patient and his wife.    They were referred to the pharmacist by their PCP for assistance in managing diabetes, hypertension, hyperlipidemia, medication access, and complex medication management.    Subjective:  Medication Access/Adherence Current Pharmacy:  [facility] [address] [place], [place] - [address] [place] [address] Phone: [phone] Fax: [phone]  [facility] - [place], [place] - [address] [place] [address] Phone: [phone] Fax: [phone]  Patient reports affordability concerns with their medications: Yes  Patient reports access/transportation concerns to their pharmacy: No  Patient reports adherence concerns with their medications:  Yes   cost of Januvia was prohibitive. Cost has improved since Medicare benefits started over at beginning of 2024.     Diabetes: Current medications:   Levemir 6 units daily. Patient has been taking Levemir daily since dose was lowered. He had previously stopped due to due to low blood glucose when he took 10 units of levemir 12/27/2022 prior to office visit - blood glucose dropped to 68.   Januvia  daily -restarted 12/26/2022.  Farxiga  daily started 12/26/2022  Current glucose readings: See Continuous Glucose Monitor report below.    Discussed GLP agents with patient in past but he declined due to his friend having a bad experience with Ozempic. Sent him information in April 2024 about other GLP1s to review.   Hypertension: Current medications: lisinopril  daily, amlodipine  daily, carvedilol  twice a day  Patient is checking blood pressure at home but he is not sure his current blood pressure monitor is accurate. Reminded he can  purchase a new blood pressure monitor with his [facility] over-the-counter benefits this month.   BP Readings from Last 3 Encounters:  12/27/22 (!) 148/86  12/18/22 (!) 179/86  12/10/22 (!) 143/78    Hyperlipidemia/ASCVD Risk Reduction Current lipid lowering medications:  atorvastatin  daily and ezetimibe  daily   LDL has improved from 167 to 107 (12/2022) since started atorvastatin  daily.  Medication Management: Current adherence strategy: wife has taken over giving medications. Using weekly pill container.  Patient reports  improving  adherence to medications. Refill history shows some late refills.   Barriers to adherence:  Multiple comorbidities Complex medication regimen Poor knowledge of the illness and medication. Independent pausing, stopping or controlling of the medication. Lack of knowledge and confidence in self-management. Low understanding / confidence in medications benefits.   Recent fill dates:  noted that the following maintenance medications have not been filled regularly Januvia - last refill was 01/15/2023 - 30 days Amlodipine - last refill was 07/15/2022 for 90 days - per wife patient has plenty because he had low adherence late 2023 and early 2024 but endorses that he is taking every day now. Carvedilol  - last refill was 12/20/2022 - 45 days Levemir - last refill was 01/27/2022 for 15 mL (per patient he has about 2 full boxes = 10 pens)  Atorvastatin - last refill was 03/07/204 - 90 days Ezetimibe  - last refill was 12/20/2022 - 90 days   Objective:  Continuous Glucose Monitor - Freestyle Libre 3 report:             Lab Results  Component Value Date  HGBA1C 7.8 (H) 12/10/2022    Lab Results  Component Value Date   CREATININE 2.52 (H) 12/27/2022   BUN 20 12/27/2022   NA 136 12/27/2022   K 4.2 12/27/2022   CL 104 12/27/2022   CO2 26 12/27/2022    Lab Results  Component Value Date   CHOL 172  12/10/2022   HDL 44.30 12/10/2022   LDLCALC 107 (H) 12/10/2022   TRIG 102.0 12/10/2022   CHOLHDL 4 12/10/2022    Medications Reviewed Today     Reviewed by Henrene Pastor, RPH-CPP (Pharmacist) on 01/23/23 at [phone]  Med List Status: <None>   Medication Order Taking? Sig Documenting Provider Last Dose Status Informant  acetaminophen (TYLENOL) 325 MG tablet [id number] Yes Take by mouth. [provider] Taking Active   allopurinol (ZYLOPRIM) 300 MG tablet [id number] Yes Take 1 tablet (300 mg total) by mouth daily. Pollyann Savoy, MD Taking Active   amLODipine (NORVASC) 10 MG tablet [id number] Yes Take 1 tablet (10 mg total) by mouth daily. [place] Dana, NP Taking Active   aspirin 81 MG chewable tablet [id number] Yes Chew 1 tablet (81 mg total) by mouth daily. Sondra Barges, PA-C Taking Active Self  atorvastatin (LIPITOR) 80 MG tablet [id number] Yes Take 1 tablet (80 mg total) by mouth daily. [place] Dana, NP Taking Active   blood glucose meter kit and supplies KIT [id number] Yes Dispense based on patient and insurance preference. Use up to four times daily as directed. Please include lancets, test strips, control solution. [place] Dana, NP Taking Active   carvedilol (COREG) 25 MG tablet [id number] Yes Take 1 tablet (25 mg total) by mouth 2 (two) times daily with a meal. Bradd Canary, MD Taking Active   cholecalciferol 25 MCG (1000 UT) tablet [id number] Yes Take by mouth. [provider] Taking Active   Continuous Blood Gluc Sensor (FREESTYLE LIBRE 3 SENSOR) [place] [id number] Yes Place 1 sensor on the skin every 14 days. Use to check glucose continuously (type 2 DM with long term insulin use - E11.22, Z79) Bradd Canary, MD Taking Active   dapagliflozin propanediol (FARXIGA) 10 MG TABS tablet [id number] Yes Take 1 tablet (10 mg total) by mouth daily. Bradd Canary, MD Taking Active   diclofenac Sodium (VOLTAREN) 1 % GEL [id number] Yes Apply topically. [provider]  Taking Active   ezetimibe (ZETIA) 10 MG tablet [id number] Yes Take 1 tablet (10 mg total) by mouth daily. Bradd Canary, MD Taking Active   ferrous sulfate 325 (65 FE) MG tablet [id number] Yes TAKE 1 TABLET BY MOUTH IN THE MORNING [place] Dana, NP Taking Active   insulin detemir (LEVEMIR FLEXTOUCH) 100 UNIT/ML FlexPen [id number] Yes Inject 10 Units into the skin at bedtime. Start with 10 units every night. Titrate up by 2 units every 3 days if morning/fasting glucose is higher than 130 consistently. [place] Dana, NP Taking Active            Med Note Clydie Braun, Vestal Crandall B   Wed Jan 23, 2023  8:51 AM) 8 units daily  Insulin Pen Needle (PEN NEEDLES) 32G X 4 MM MISC [id number] Yes 1 each by Does not apply route daily. [place]borne Dana, NP Taking Active   lisinopril (ZESTRIL) 40 MG tablet [id number] Yes Take 1 tablet (40 mg total) by mouth daily. Clayborne Dana, NP Taking Active   nitroGLYCERIN (NITROSTAT) 0.4 MG SL tablet [id number] Yes Place 1 tablet (0.4 mg total) under the tongue every 5 (five)  minutes as needed for chest pain. Clayborne Dana, NP Taking Active            Med Note (CANTER, KAYLYN D   Tue Jun 12, 2022  9:25 AM) PRN  pantoprazole (PROTONIX) 40 MG tablet [id number] Yes Take 1 tablet by mouth twice daily Clay[place] Dana, NP Taking Active   sitaGLIPtin (JANUVIA) 50 MG tablet [id number] Yes Take 1 tablet (50 mg total) by mouth daily. Clayborne Dana, NP Taking Active            Med Note Haze Justin   Fri Dec 28, 2022  8:56 AM) Patient stated taking 12/26/2022  tamsulosin (FLOMAX) 0.4 MG CAPS capsule [id number] Yes Take 0.4 mg by mouth. [provider] Taking Active               Assessment/Plan:   Diabetes: not at goal of A1c < 7.0% but A1c has improved to 7.8 % over the last 6 months. Continuous Glucose Monitor report shows blood glucose is > 180 over 79% of time.  - Reviewed Continuous Glucose Monitor report with patient.  - Recommended increase Levemir to 8 units daily thru  this Sunday 01/27/2023, if blood glucose in AM still > 130, then increase to 10 units daily - Lower dose of Januvia to 25mg  daily (based on last Serum Cr of 2.52 and estimated eGFR of 28)   - Encouraged patient to complete application for Januvia medication assistance program. This one has to be mailed so he will bring to office when completed.    Hypertension: not at goal of < 130/80 for CKD + DM but blood pressure had improved when checked last in our office.  - Reviewed long term cardiovascular and renal outcomes of uncontrolled blood pressure - continue to take lisinopril 40mg  daily, amlodipine 10mg  daily and carvedilol 25mg  twice a day - continue to check blood pressure at home 2 to 3 times per week and record.  - PCP sent referral to hypertension clinic in March 2024.    Hyperlipidemia/ASCVD Risk Reduction: not at goal of LDL < 70 - Recommend to continue atorvastatin 80mg  daily and ezetimibe 10mg  daily - will continue to follow lipids and adherence   Medication Management: - Current strategy shows improved medication adherence.  - Continue to use weekly pill box to organize medications. Wife will continue to assist with medication administration.   - All meds will be $0 for 2024 except Farxiga $47/mo, Januvia $47/mo (until coverage gap) Insulin will be $35/mo all year. Approved for medication assistance program for Farxiag thru 10/08/2023. Patient has application for Januvia medication assistance program.   Follow Up Plan: 2 to 4 weeks; A1c due in June 2024.   Henrene Pastor, PharmD Clinical Pharmacist [facility]

## 2023-01-29 ENCOUNTER — Encounter: Payer: Self-pay | Admitting: Neurology

## 2023-01-29 LAB — POCT UA - MICROSCOPIC ONLY
Bacteria, U Microscopic: NONE SEEN
RBC, Urine, Miroscopic: NONE SEEN (ref 0–2)

## 2023-01-29 LAB — URINALYSIS, DIPSTICK ONLY
Bilirubin: NEGATIVE
Ketones: NEGATIVE
Nitrite: NEGATIVE
Occult Blood: NEGATIVE
Specific Gravity: 1.01
Urobilinogen, UA: 0.2 E.U./dL
WBC Urine, dipstick: NEGATIVE
pH: 6

## 2023-01-29 LAB — PROTEIN / CREATININE RATIO, URINE: Protein/Creat. Ratio: 854

## 2023-01-29 LAB — PROTEIN,TOTAL,URINE: PROTEIN,TOTAL,URINE: 49.7

## 2023-01-30 MED ORDER — SITAGLIPTIN PHOSPHATE 50 MG PO TABS
25.0000 mg | ORAL_TABLET | Freq: Every day | ORAL | 2 refills | Status: DC
Start: 2023-01-30 — End: 2024-06-22

## 2023-02-07 DIAGNOSIS — E113513 Type 2 diabetes mellitus with proliferative diabetic retinopathy with macular edema, bilateral: Secondary | ICD-10-CM | POA: Diagnosis not present

## 2023-02-07 DIAGNOSIS — H26491 Other secondary cataract, right eye: Secondary | ICD-10-CM | POA: Diagnosis not present

## 2023-02-07 DIAGNOSIS — H2512 Age-related nuclear cataract, left eye: Secondary | ICD-10-CM | POA: Diagnosis not present

## 2023-02-07 DIAGNOSIS — H35372 Puckering of macula, left eye: Secondary | ICD-10-CM | POA: Diagnosis not present

## 2023-02-07 DIAGNOSIS — Z961 Presence of intraocular lens: Secondary | ICD-10-CM | POA: Diagnosis not present

## 2023-02-11 NOTE — Progress Notes (Deleted)
Office Visit Note  Patient: Matthew Fisher             Date of Birth: 03-12-1971           MRN: [id number]             PCP: Clayborne Dana, NP Referring: Clayborne Dana, NP Visit Date: 02/22/2023 Occupation: @GUAROCC @  Subjective:  No chief complaint on file.   History of Present Illness: Matthew Fisher is a 52 y.o. male ***     Activities of Daily Living:  Patient reports morning stiffness for *** {minute/hour:19697}.   Patient {ACTIONS;DENIES/REPORTS:21021675::"Denies"} nocturnal pain.  Difficulty dressing/grooming: {ACTIONS;DENIES/REPORTS:21021675::"Denies"} Difficulty climbing stairs: {ACTIONS;DENIES/REPORTS:21021675::"Denies"} Difficulty getting out of chair: {ACTIONS;DENIES/REPORTS:21021675::"Denies"} Difficulty using hands for taps, buttons, cutlery, and/or writing: {ACTIONS;DENIES/REPORTS:21021675::"Denies"}  No Rheumatology ROS completed.   PMFS History:  Patient Active Problem List   Diagnosis Date Noted   Primary osteoarthritis of both knees 01/09/2022   Gout 12/21/2021   GERD (gastroesophageal reflux disease) 12/21/2021   Arthritis 12/21/2021   Gastroesophageal reflux disease with esophagitis 12/04/2021   Chronic gout of knee 12/04/2021   Mixed hyperlipidemia 12/04/2021   Iron deficiency 12/04/2021   Acute gout due to renal impairment involving left knee 09/28/2021   Synovitis of right knee 09/28/2021   CHF (congestive heart failure) (HCC) 09/11/2019   Nonadherence to medical treatment    Tobacco abuse disorder    CAD (coronary artery disease) 10/18/2017   CKD (chronic kidney disease), 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 NSTEMI 10/2017 s/p multivessel PCI of the OM1, PDA, PLA ostium and mid PLA.   CKD (chronic kidney disease), stage II    GERD (gastroesophageal reflux disease)    Gout    Hypertension    Insulin dependent diabetes mellitus     Family History  Problem Relation Age of Onset   Diabetes Mother    Cancer Father    Healthy Daughter    Healthy Daughter    Healthy Son    Past Surgical History:  Procedure Laterality Date   CORONARY STENT INTERVENTION N/A 10/17/2017   Procedure: CORONARY STENT INTERVENTION;  Surgeon: Corky Crafts, MD;  Location: [facility];  Service: Cardiovascular;  Laterality: N/A;   EYE SURGERY     EYE SURGERY Right 08/2022   laser surgery   FRACTURE SURGERY     right ring finger   KNEE ARTHROSCOPY WITH LATERAL MENISECTOMY Left 01/22/2013   Procedure: LEFT KNEE ARTHROSCOPY DEBRIDEMENT CHONDROPLASTY, LATERAL RELEASE AND partial medial meniscectomy;  Surgeon: Javier Docker, MD;  Location: [facility];  Service: Orthopedics;  Laterality: Left;   KNEE SURGERY     "removed bone"   LEFT HEART CATH N/A 10/17/2017   Procedure: Left Heart Cath;  Surgeon: Corky Crafts, MD;  Location: [facility];  Service: Cardiovascular;  Laterality: N/A;   LEFT HEART CATH AND CORONARY ANGIOGRAPHY N/A 10/15/2017   Procedure: LEFT HEART CATH AND CORONARY ANGIOGRAPHY;  Surgeon: Marykay Lex, MD;  Location: [facility] INVASIVE CV LAB;  Service: Cardiovascular;  Laterality: N/A;   SHOULDER SURGERY Bilateral    Social History  Social History Narrative   Not on file   Immunization History  Administered Date(s) Administered   PFIZER(Purple Top)SARS-COV-2 Vaccination 04/19/2020, 05/10/2020   Tdap 12/12/2004, 05/09/2011     Objective: Vital Signs: There were no vitals taken for this visit.   Physical Exam   Musculoskeletal Exam: ***  CDAI Exam: CDAI Score: -- Patient Global: --; Provider Global: -- Swollen: --; Tender:  -- Joint Exam 02/22/2023   No joint exam has been documented for this visit   There is currently no information documented on the homunculus. Go to the Rheumatology activity and complete the homunculus joint exam.  Investigation: No additional findings.  Imaging: No results found.  Recent Labs: Lab Results  Component Value Date   WBC 5.5 12/18/2022   HGB 11.3 (L) 12/18/2022   PLT 206 12/18/2022   NA 136 12/27/2022   K 4.2 12/27/2022   CL 104 12/27/2022   CO2 26 12/27/2022   GLUCOSE 249 (H) 12/27/2022   BUN 20 12/27/2022   CREATININE 2.52 (H) 12/27/2022   BILITOT 0.5 12/18/2022   ALKPHOS 92 12/10/2022   AST 18 12/18/2022   ALT 15 12/18/2022   PROT 6.5 12/18/2022   ALBUMIN 3.7 12/10/2022   CALCIUM 8.6 12/27/2022   GFRAA 42 (L) 09/12/2019    Speciality Comments: No specialty comments available.  Procedures:  No procedures performed Allergies: Patient has no known allergies.   Assessment / Plan:     Visit Diagnoses: No diagnosis found.  Orders: No orders of the defined types were placed in this encounter.  No orders of the defined types were placed in this encounter.   Face-to-face time spent with patient was *** minutes. Greater than 50% of time was spent in counseling and coordination of care.  Follow-Up Instructions: No follow-ups on file.   Ellen Henri, CMA  Note - This record has been created using Animal nutritionist.  Chart creation errors have been sought, but may not always  have been located. Such creation errors do not reflect on  the standard of medical care.

## 2023-02-12 ENCOUNTER — Telehealth: Payer: Self-pay | Admitting: Family Medicine

## 2023-02-12 NOTE — Telephone Encounter (Signed)
Contacted Idhant Lehrman to schedule their annual wellness visit. Appointment made for 04/04/2023.  Verlee Rossetti; Care Guide Ambulatory Clinical Support [place] [facility] Direct Dial: [phone]

## 2023-02-20 ENCOUNTER — Telehealth: Payer: Self-pay | Admitting: Rheumatology

## 2023-02-20 NOTE — Telephone Encounter (Signed)
Patient's wife Alcario Drought called to see if the appointment scheduled for Friday 02/22/23 is necessary.  She states he was just seen 2 months ago.  If labwork is needed he could have them done in [facility] so he doesn't have to travel to [place].

## 2023-02-20 NOTE — Telephone Encounter (Signed)
I called patient's wife, Alcario Drought, who verbalized understanding, patient will call to schedule appt as needed.

## 2023-02-20 NOTE — Telephone Encounter (Signed)
Okay to postpone follow-up as long as he has not had any signs or symptoms of a gout flare.  Recommend updating uric acid with routine lab work.  If he has lab work at Dr. Jon Gills office he should have uric acid added at that time.

## 2023-02-21 DIAGNOSIS — E113513 Type 2 diabetes mellitus with proliferative diabetic retinopathy with macular edema, bilateral: Secondary | ICD-10-CM | POA: Diagnosis not present

## 2023-02-22 ENCOUNTER — Ambulatory Visit: Payer: Medicare Other | Admitting: Physician Assistant

## 2023-02-22 DIAGNOSIS — M79642 Pain in left hand: Secondary | ICD-10-CM

## 2023-02-22 DIAGNOSIS — I2510 Atherosclerotic heart disease of native coronary artery without angina pectoris: Secondary | ICD-10-CM

## 2023-02-22 DIAGNOSIS — E1165 Type 2 diabetes mellitus with hyperglycemia: Secondary | ICD-10-CM

## 2023-02-22 DIAGNOSIS — Z8719 Personal history of other diseases of the digestive system: Secondary | ICD-10-CM

## 2023-02-22 DIAGNOSIS — M79671 Pain in right foot: Secondary | ICD-10-CM

## 2023-02-22 DIAGNOSIS — M1A09X1 Idiopathic chronic gout, multiple sites, with tophus (tophi): Secondary | ICD-10-CM

## 2023-02-22 DIAGNOSIS — E611 Iron deficiency: Secondary | ICD-10-CM

## 2023-02-22 DIAGNOSIS — M24521 Contracture, right elbow: Secondary | ICD-10-CM

## 2023-02-22 DIAGNOSIS — E782 Mixed hyperlipidemia: Secondary | ICD-10-CM

## 2023-02-22 DIAGNOSIS — I214 Non-ST elevation (NSTEMI) myocardial infarction: Secondary | ICD-10-CM

## 2023-02-22 DIAGNOSIS — M170 Bilateral primary osteoarthritis of knee: Secondary | ICD-10-CM

## 2023-02-22 DIAGNOSIS — N184 Chronic kidney disease, stage 4 (severe): Secondary | ICD-10-CM

## 2023-02-22 DIAGNOSIS — E1343 Other specified diabetes mellitus with diabetic autonomic (poly)neuropathy: Secondary | ICD-10-CM

## 2023-02-22 DIAGNOSIS — I161 Hypertensive emergency: Secondary | ICD-10-CM

## 2023-02-22 DIAGNOSIS — Z720 Tobacco use: Secondary | ICD-10-CM

## 2023-02-27 ENCOUNTER — Ambulatory Visit (HOSPITAL_BASED_OUTPATIENT_CLINIC_OR_DEPARTMENT_OTHER): Payer: Medicare Other | Admitting: Cardiovascular Disease

## 2023-02-27 ENCOUNTER — Encounter (HOSPITAL_BASED_OUTPATIENT_CLINIC_OR_DEPARTMENT_OTHER): Payer: Self-pay | Admitting: Cardiovascular Disease

## 2023-02-27 ENCOUNTER — Other Ambulatory Visit (HOSPITAL_BASED_OUTPATIENT_CLINIC_OR_DEPARTMENT_OTHER): Payer: Self-pay | Admitting: Cardiology

## 2023-02-27 VITALS — BP 138/76 | HR 72 | Ht 68.0 in | Wt 215.8 lb

## 2023-02-27 DIAGNOSIS — I2510 Atherosclerotic heart disease of native coronary artery without angina pectoris: Secondary | ICD-10-CM

## 2023-02-27 DIAGNOSIS — R5383 Other fatigue: Secondary | ICD-10-CM

## 2023-02-27 DIAGNOSIS — R400 Somnolence: Secondary | ICD-10-CM | POA: Diagnosis not present

## 2023-02-27 DIAGNOSIS — Z79899 Other long term (current) drug therapy: Secondary | ICD-10-CM | POA: Diagnosis not present

## 2023-02-27 DIAGNOSIS — I10 Essential (primary) hypertension: Secondary | ICD-10-CM

## 2023-02-27 DIAGNOSIS — E782 Mixed hyperlipidemia: Secondary | ICD-10-CM

## 2023-02-27 MED ORDER — HYDRALAZINE HCL 25 MG PO TABS: 25.0000 mg | ORAL_TABLET | Freq: Two times a day (BID) | ORAL | 3 refills | Status: DC

## 2023-02-27 NOTE — Progress Notes (Signed)
[facility] Initial Assessment:    Date:  02/27/2023   ID:  Matthew Fisher, DOB [birth year], MRN [id number]  PCP:  Clayborne Dana, NP  Cardiologist:  Gypsy Balsam, MD  Nephrologist:  Referring MD: Clayborne Dana, NP   CC: Hypertension  History of Present Illness:    Matthew Fisher is a 52 y.o. male with a hx of hypertension, CAD with NSTEMI 10/2017 s/p multivessel PCI of OM1, PDA, PLA ostium, and mid PLA, GERD, CKD IIIb, diabetes, prior tobacco abuse, and gout, here to establish care in the [facility]. He was seen by Hyman Hopes, NP 12/10/2022 and his blood pressure was 143/78. His lisinopril was increased to 40 mg daily. He presented at his PCP's office 12/27/2022 for a blood pressure check. His blood pressure readings showed: 9:05 AM left arm 146/86, 9:07 AM right arm 158/86, 9:11 AM left arm 150/82, 9:13 AM right arm 148/86. He was compliant with amlodipine 10mg , coreg 25mg , lisinopril 40mg . He was referred to the [facility] and advised to obtain a home BP cuff.    Today, he reports having high blood pressure for 7-8 years and more recently becoming difficult to control. When he checks his readings at home, his blood pressure will be similar to in the office today at 145/88 (138/76 on recheck) but sometimes a little higher. For exercise he is frequently mowing the lawn. He may be short of breath sometimes, but denies anginal symptoms. His shortness of breath has been more noticeable in the past 2 years. After he receives his second wind he is able to continue without significant issues. Sometimes he develops LE swelling lasting for a day or so, then it resolves. He will cook most of his meals at home and will occasionally order out. He doesn't add any salt to his meals. Usually he drinks a lot of water, with a soda every now and then. Every morning he will have 1 cup of coffee. Alcohol consumption is limited to the weekends, up to 6-8  beers. He is a former smoker of cigarettes, probably 1 pack every 3 days at his peak. He is currently using vapes but is considering quitting this as well. He endorses snoring. Sometimes he feels well rested or fatigued in the mornings, dependent on how frequently he needs to wake up to use the restroom. He also affirms daytime somnolence. He was previously set up for a sleep study but he didn't receive any further communication. He denies any palpitations, chest pain, lightheadedness, headaches, syncope, orthopnea, or PND.  Previous antihypertensives:   Past Medical History:  Diagnosis Date   Arthritis    CAD (coronary artery disease)    a. NSTEMI 10/2017 s/p multivessel PCI of the OM1, PDA, PLA ostium and mid PLA.   CKD (chronic kidney disease), stage II    GERD (gastroesophageal reflux disease)    Gout    Hypertension    Insulin dependent diabetes mellitus     Past Surgical History:  Procedure Laterality Date   CORONARY STENT INTERVENTION N/A 10/17/2017   Procedure: CORONARY STENT INTERVENTION;  Surgeon: Corky Crafts, MD;  Location: [facility] INVASIVE CV LAB;  Service: Cardiovascular;  Laterality: N/A;   EYE SURGERY     EYE SURGERY Right 08/2022   laser surgery   FRACTURE SURGERY     right ring finger   KNEE ARTHROSCOPY WITH LATERAL MENISECTOMY Left 01/22/2013   Procedure: LEFT KNEE ARTHROSCOPY DEBRIDEMENT CHONDROPLASTY, LATERAL RELEASE AND partial medial meniscectomy;  Surgeon: Javier Docker, MD;  Location: [facility];  Service: Orthopedics;  Laterality: Left;   KNEE SURGERY     "removed bone"   LEFT HEART CATH N/A 10/17/2017   Procedure: Left Heart Cath;  Surgeon: Corky Crafts, MD;  Location: [facility] INVASIVE CV LAB;  Service: Cardiovascular;  Laterality: N/A;   LEFT HEART CATH AND CORONARY ANGIOGRAPHY N/A 10/15/2017   Procedure: LEFT HEART CATH AND CORONARY ANGIOGRAPHY;  Surgeon: Marykay Lex, MD;  Location: [facility] INVASIVE CV LAB;  Service: Cardiovascular;  Laterality: N/A;    SHOULDER SURGERY Bilateral     Current Medications: Current Meds  Medication Sig   acetaminophen (TYLENOL) 325 MG tablet Take by mouth.   allopurinol (ZYLOPRIM) 300 MG tablet Take 1 tablet (300 mg total) by mouth daily.   amLODipine (NORVASC) 10 MG tablet Take 1 tablet (10 mg total) by mouth daily.   aspirin 81 MG chewable tablet Chew 1 tablet (81 mg total) by mouth daily.   atorvastatin (LIPITOR) 80 MG tablet Take 1 tablet (80 mg total) by mouth daily.   blood glucose meter kit and supplies KIT Dispense based on patient and insurance preference. Use up to four times daily as directed. Please include lancets, test strips, control solution.   carvedilol (COREG) 25 MG tablet Take 1 tablet (25 mg total) by mouth 2 (two) times daily with a meal.   cholecalciferol 25 MCG (1000 UT) tablet Take by mouth.   Continuous Blood Gluc Sensor (FREESTYLE LIBRE 3 SENSOR) MISC Place 1 sensor on the skin every 14 days. Use to check glucose continuously (type 2 DM with long term insulin use - E11.22, Z79)   diclofenac Sodium (VOLTAREN) 1 % GEL Apply topically.   ezetimibe (ZETIA) 10 MG tablet Take 1 tablet (10 mg total) by mouth daily.   ferrous sulfate 325 (65 FE) MG tablet TAKE 1 TABLET BY MOUTH IN THE MORNING   furosemide (LASIX) 40 MG tablet Take 40 mg by mouth daily.   hydrALAZINE (APRESOLINE) 25 MG tablet Take 1 tablet (25 mg total) by mouth in the morning and at bedtime.   insulin detemir (LEVEMIR FLEXTOUCH) 100 UNIT/ML FlexPen Inject 10 Units into the skin at bedtime. Start with 10 units every night. Titrate up by 2 units every 3 days if morning/fasting glucose is higher than 130 consistently.   Insulin Pen Needle (PEN NEEDLES) 32G X 4 MM MISC 1 each by Does not apply route daily.   lisinopril (ZESTRIL) 20 MG tablet Take 20 mg by mouth daily.   nitroGLYCERIN (NITROSTAT) 0.4 MG SL tablet Place 1 tablet (0.4 mg total) under the tongue every 5 (five) minutes as needed for chest pain.   pantoprazole  (PROTONIX) 40 MG tablet Take 1 tablet by mouth twice daily   sitaGLIPtin (JANUVIA) 50 MG tablet Take 0.5 tablets (25 mg total) by mouth daily.   tamsulosin (FLOMAX) 0.4 MG CAPS capsule Take 0.4 mg by mouth.   [DISCONTINUED] dapagliflozin propanediol (FARXIGA) 10 MG TABS tablet Take 1 tablet (10 mg total) by mouth daily.     Allergies:   Patient has no known allergies.   Social History   Socioeconomic History   Marital status: Married    Spouse name: Not on file   Number of children: Not on file   Years of education: Not on file   Highest education level: Not on file  Occupational History   Not on file  Tobacco Use   Smoking status: Former    Packs/day: 0.50  Years: 20.00    Additional pack years: 0.00    Total pack years: 10.00    Types: Cigarettes, Cigars    Quit date: 10/08/2002    Years since quitting: 20.4    Passive exposure: Never   Smokeless tobacco: Never  Vaping Use   Vaping Use: Every day   Substances: Nicotine, Flavoring  Substance and Sexual Activity   Alcohol use: Yes    Comment: occasional   Drug use: No   Sexual activity: Yes  Other Topics Concern   Not on file  Social History Narrative   Not on file   Social Determinants of Health   Financial Resource Strain: Medium Risk (12/28/2022)   Overall Financial Resource Strain (CARDIA)    Difficulty of Paying Living Expenses: Somewhat hard  Food Insecurity: No Food Insecurity (04/02/2022)   Hunger Vital Sign    Worried About Running Out of Food in the Last Year: Never true    Ran Out of Food in the Last Year: Never true  Transportation Needs: No Transportation Needs (04/02/2022)   PRAPARE - Administrator, Civil Service (Medical): No    Lack of Transportation (Non-Medical): No  Physical Activity: Sufficiently Active (04/02/2022)   Exercise Vital Sign    Days of Exercise per Week: 7 days    Minutes of Exercise per Session: 30 min  Stress: No Stress Concern Present (04/02/2022)   [employer] of Occupational Health - Occupational Stress Questionnaire    Feeling of Stress : Not at all  Social Connections: Moderately Integrated (04/02/2022)   Social Connection and Isolation Panel [NHANES]    Frequency of Communication with Friends and Family: More than three times a week    Frequency of Social Gatherings with Friends and Family: More than three times a week    Attends Religious Services: Never    Database administrator or Organizations: Yes    Attends Engineer, structural: More than 4 times per year    Marital Status: Married  Recent Concern: Social Connections - Moderately Isolated (04/02/2022)   Social Connection and Isolation Panel [NHANES]    Frequency of Communication with Friends and Family: More than three times a week    Frequency of Social Gatherings with Friends and Family: More than three times a week    Attends Religious Services: Never    Database administrator or Organizations: No    Attends Engineer, structural: Never    Marital Status: Married     Family History: The patient's family history includes Cancer in his father; Diabetes in his mother; Healthy in his daughter, daughter, and son.  ROS:   Please see the history of present illness.    (+) Shortness of breath (+) LE swelling (+) Snoring (+) Daytime somnolence All other systems reviewed and are negative.  EKGs/Labs/Other Studies Reviewed:    Echo  01/22/2022:  1. Left ventricular ejection fraction, by estimation, is 55 to 60%. The  left ventricle has normal function. The left ventricle has no regional  wall motion abnormalities. There is mild concentric left ventricular  hypertrophy. Left ventricular diastolic  parameters are consistent with Grade I diastolic dysfunction (impaired  relaxation).   2. Right ventricular systolic function is normal. The right ventricular  size is normal. There is normal pulmonary artery systolic pressure.   3. Left atrial size was  moderately dilated.   4. The mitral valve is normal in structure. Trivial mitral valve  regurgitation. No evidence of  mitral stenosis.   5. The aortic valve is tricuspid. Aortic valve regurgitation is not  visualized. No aortic stenosis is present.   6. The inferior vena cava is normal in size with greater than 50%  respiratory variability, suggesting right atrial pressure of 3 mmHg.   Exercise Stress Myoview  01/16/2022:   The study is normal. The study is low risk.   No ST deviation was noted.   LV perfusion is normal.   Left ventricular function is normal. Nuclear stress EF: 57 %. The left ventricular ejection fraction is normal (55-65%). End diastolic cavity size is normal.   Prior study not available for comparison. Low risk stress nuclear study with normal perfusion and normal left ventricular regional and global systolic function.  Coronary Stent Intervention/Left Heart Cath  10/17/2017: Dist LAD lesion is 100% stenosed. Mid LAD lesion is 70% stenosed. Ost 1st Mrg lesion is 80% stenosed. A drug-eluting stent was successfully placed using a STENT SYNERGY DES 2.75X12. Post intervention, there is a 0% residual stenosis. Ost RPDA lesion is 75% stenosed. A drug-eluting stent was successfully placed using a STENT SYNERGY DES 3X16. Post intervention, there is a 0% residual stenosis. Post Atrio-2 lesion is 70% stenosed. Post Atrio-3 lesion is 95% stenosed. A drug-eluting stent was successfully placed using a STENT SYNERGY DES 2.5X12. A second stent, 2.25 x 12 Synergy was placed overlapping. Post intervention, there is a 0% residual stenosis. Kissing balloon angioplasty was done at the ostium of the PDA and the PLA. The ostium of the posterolateral artery was treated Using a BALLOON SAPPHIRE 2.5X12. Post intervention, there is a 40% residual stenosis at the ostium of the PLA. LV end diastolic pressure is normal. There is no aortic valve stenosis. A drug-eluting stent was successfully  placed.   Successful PCI of the OM1, PDA, PLA ostium and mid PLA.     Continue dual antiplatelet therapy for at least one year along with aggressive secondary prevention.     Would consider indefinite clopidogrel given the extent of his disease.   Diagnostic Dominance: Right  Intervention     Left Heart Cath  10/15/2017: Distal RCA bifurcation lesionS: Ost RPDA lesion is 50% stenosed. Post Atrio-1 lesion is 70% stenosed. Post Atrio-2 lesion is 95% stenosed. Ost 1st Mrg lesion is 80% stenosed. Major bifurcating OM branch. Prox Cx to Mid Cx lesion is 50% stenosed. Mid Cx to Dist Cx lesion is 90% stenosed. Prox LAD to Mid LAD lesion is 60% stenosed. Mid LAD lesion is 70% stenosed. Dist LAD lesion is 100% stenosed. Ost 1st Diag to 1st Diag lesion is 90% stenosed. Small moderate caliber major diagonal branch. There is hyperdynamic left ventricular systolic function. The left ventricular ejection fraction is greater than 65% by visual estimate. LV end diastolic pressure is normal. There is no mitral valve regurgitation.   Severe multivessel disease, possibly diabetic in nature.   The LAD itself is probably the least diseased vessel, however distally it is diffusely diseased and occludes. The major OM1 has a focal 70-80% stenosis, and the AV groove circumflex terminates with severe disease in multiple small bifurcations. There is bifurcation disease at the distal RCA into the RPDA and the RPAV with a more severe lesion in the proximal portion of the PADV that is a likely culprit lesion.   Given the extent of his CAD, the patient needs to be considered for bypass surgery is an option with diabetes and multivessel disease.  Unfortunately, targets not great with exception of the PDA,  PL and OM1.  Diagonal and LAD are decent graft targets.   Plan for now will be to admit the patient to the stepdown/ICU unit for maintaining stability with IV nitroglycerin and heparin.  We will discuss with  interventional colleagues and likely consult CT surgery morning. Restart IV nitroglycerin for blood pressure control.  Add carvedilol to his home dose of ACE inhibitor. High-dose statin and aspirin.   If he does go for bypass surgery, he would need to be on Plavix or Brilinta on discharge given this year extent of his disease.   EKG:  EKG is personally reviewed. 02/27/2023: Sinus rhythm. Rate 72 bpm. Nonspecific T wave abnormalities.  Recent Labs: 03/12/2022: TSH 1.74 12/18/2022: ALT 15; Hemoglobin 11.3; Platelets 206 12/27/2022: BUN 20; Creatinine, Ser 2.52; Potassium 4.2; Sodium 136   Recent Lipid Panel    Component Value Date/Time   CHOL 172 12/10/2022 0935   CHOL 101 02/21/2022 0917   TRIG 102.0 12/10/2022 0935   HDL 44.30 12/10/2022 0935   HDL 30 (L) 02/21/2022 0917   CHOLHDL 4 12/10/2022 0935   VLDL 20.4 12/10/2022 0935   LDLCALC 107 (H) 12/10/2022 0935   LDLCALC 41 02/21/2022 0917    Physical Exam:    VS:  BP 138/76 (BP Location: Right Arm, Patient Position: Sitting, Cuff Size: Large)   Pulse 72   Ht 5\' 8"  (1.727 m)   Wt 215 lb 12.8 oz (97.9 kg)   BMI 32.81 kg/m  , BMI Body mass index is 32.81 kg/m. GENERAL:  Well appearing HEENT: Pupils equal round and reactive, fundi not visualized, oral mucosa unremarkable NECK:  No jugular venous distention, waveform within normal limits, carotid upstroke brisk and symmetric, no bruits, no thyromegaly LUNGS:  Clear to auscultation bilaterally HEART:  RRR.  PMI not displaced or sustained,S1 and S2 within normal limits, no S3, no S4, no clicks, no rubs, no murmurs ABD:  Flat, positive bowel sounds normal in frequency in pitch, no bruits, no rebound, no guarding, no midline pulsatile mass, no hepatomegaly, no splenomegaly EXT:  2 plus pulses throughout, no edema, no cyanosis, no clubbing SKIN:  No rashes, no nodules NEURO:  Cranial nerves II through XII grossly intact, motor grossly intact throughout PSYCH:  Cognitively intact,  oriented to person place and time   ASSESSMENT/PLAN:    Mixed hyperlipidemia Lipids are uncontrolled on atorvastatin and Zetia.  Will focus on HTN for today.  Suggest PCSK9 inhibitor or Inclisiran at follow up.  # Resistant HTN: - Continue current medications: lisinopril, carvedilol, and chlorthalidone. - Add hydralazine 25 mg twice a day. - Encourage low sodium diet and use of My Fitness Pal app to track sodium intake. - Monitor blood pressure at home and bring records to follow-up appointments. - Follow up with Baxter Hire (pharmacist) or Luther Parody (nurse practitioner) in one month, and with Dr. Duke Salvia in four months. - Order lab work for hormonal imbalances (renin and aldosterone levels) and thyroid function. - Schedule renal doppler ultrasound to evaluate renal artery stenosis. Lifestyle Modifications: - Encourage continued exercise and activity. - Reinforce the importance of a low sodium diet. - Praise smoking cessation and encourage quitting vaping. - Discuss alcohol consumption and its potential impact on blood pressure.  # Chronic Kidney Disease (Stage 3B): - Monitor kidney function with lab work. - Control blood pressure to prevent further progression. - Continue Nephrology follow up  # Shortness of Breath:  Not lifestyle or exercise limitng.  - Continue monitoring symptoms and report any changes or worsening.  #  Peripheral Edema: - Monitor for worsening or persistent swelling in legs and feet. - Improves with elevation.  No orthopnea or PND  # Snoring: - Order home sleep study to evaluate for sleep apnea. - Follow up with results and discuss treatment options if needed.   Research: Politely declined RPM and AA Heart Studies  Screening for Secondary Hypertension:     02/27/2023    9:25 AM  Causes  Drugs/Herbals Screened     - Comments tries to limit salt, one coffee daily, occasional EtOH, prior tobacco.  +vapes  Sleep Apnea Screened     - Comments snores.  +  daytime somnolence  Thyroid Disease Screened  Hyperaldosteronism Screened     - Comments check renin and aledosterone  Pheochromocytoma N/A  Cushing's Syndrome N/A  Hyperparathyroidism N/A  Coarctation of the Aorta Screened  Compliance Screened    Relevant Labs/Studies:    Latest Ref Rng & Units 12/27/2022    9:39 AM 12/18/2022    9:37 AM 12/10/2022    9:35 AM  Basic Labs  Sodium 135 - 145 mEq/L 136  136  136   Potassium 3.5 - 5.1 mEq/L 4.2  4.4  4.6   Creatinine 0.40 - 1.50 mg/dL 1.61  0.96  0.45        Latest Ref Rng & Units 03/12/2022    3:46 PM 12/04/2021    3:39 PM  Thyroid   TSH 0.35 - 5.50 uIU/mL 1.74  1.09      Disposition:    FU with APP/PharmD in 1 month for the next 3 months.   FU with Raziah Funnell C. Duke Salvia, MD, [facility] in 4 months.  Medication Adjustments/Labs and Tests Ordered: Current medicines are reviewed at length with the patient today.  Concerns regarding medicines are outlined above.   Orders Placed This Encounter  Procedures   Aldosterone + renin activity w/ ratio   TSH   EKG 12-Lead   [place] Sleep Study   VAS US RENAL ARTERY DUPLEX   Meds ordered this encounter  Medications   hydrALAZINE (APRESOLINE) 25 MG tablet    Sig: Take 1 tablet (25 mg total) by mouth in the morning and at bedtime.    Dispense:  270 tablet    Refill:  3   I,Mathew Stumpf,acting as a scribe for Chilton Si, MD.,have documented all relevant documentation on the behalf of Chilton Si, MD,as directed by  Chilton Si, MD while in the presence of Chilton Si, MD.  I, Sim Choquette C. Duke Salvia, MD have reviewed all documentation for this visit.  The documentation of the exam, diagnosis, procedures, and orders on 02/27/2023 are all accurate and complete.   Signed, Chilton Si, MD  02/27/2023 11:40 AM    [facility]

## 2023-02-27 NOTE — Patient Instructions (Addendum)
Medication Instructions:  START- Hydralazine 25 mg by mouth twice a day  *If you need a refill on your cardiac medications before your next appointment, please call your pharmacy*  Lab Work: Renin and Aldosterone and TSH  If you have labs (blood work) drawn today and your tests are completely normal, you will receive your results only by: MyChart Message (if you have MyChart) OR A paper copy in the mail If you have any lab test that is abnormal or we need to change your treatment, we will call you to review the results.  Testing/Procedures: Your physician has recommended that you have a sleep study. This test records several body functions during sleep, including: brain activity, eye movement, oxygen and carbon dioxide blood levels, heart rate and rhythm, breathing rate and rhythm, the flow of air through your mouth and nose, snoring, body muscle movements, and chest and belly movement.  Your physician has requested that you have a renal artery duplex. During this test, an ultrasound is used to evaluate blood flow to the kidneys. Allow one hour for this exam. Do not eat after midnight the day before and avoid carbonated beverages. Take your medications as you usually do.  Follow-Up:  04/01/2023 8:30 am with pharm D at [facility] location   07/02/2023 8:15 am with Dr Duke Salvia at [facility]  Other Instructions MONITOR YOUR BLOOD PRESSURE AND LOG AT HOME TWICE A DAY. BRING YOUR MACHINE AND LOG TO FOLLOW UP IN 1 MONTH

## 2023-02-27 NOTE — Assessment & Plan Note (Signed)
Lipids are uncontrolled on atorvastatin and Zetia.  Will focus on HTN for today.  Suggest PCSK9 inhibitor or Inclisiran at follow up.

## 2023-03-01 ENCOUNTER — Other Ambulatory Visit: Payer: Self-pay | Admitting: Pharmacist

## 2023-03-01 DIAGNOSIS — I10 Essential (primary) hypertension: Secondary | ICD-10-CM

## 2023-03-01 DIAGNOSIS — E782 Mixed hyperlipidemia: Secondary | ICD-10-CM

## 2023-03-06 LAB — TSH: TSH: 1.22 u[IU]/mL (ref 0.450–4.500)

## 2023-03-06 LAB — ALDOSTERONE + RENIN ACTIVITY W/ RATIO
Aldos/Renin Ratio: 0.3 (ref 0.0–30.0)
Aldosterone: 1.1 ng/dL (ref 0.0–30.0)
Renin Activity, Plasma: 3.423 ng/mL/hr (ref 0.167–5.380)

## 2023-03-11 MED ORDER — AMLODIPINE BESYLATE 10 MG PO TABS
10.0000 mg | ORAL_TABLET | Freq: Every day | ORAL | 5 refills | Status: DC
Start: 2023-03-11 — End: 2023-04-04

## 2023-03-11 MED ORDER — ATORVASTATIN CALCIUM 80 MG PO TABS
80.0000 mg | ORAL_TABLET | Freq: Every day | ORAL | Status: DC
Start: 2023-03-11 — End: 2023-09-25

## 2023-03-11 MED ORDER — FUROSEMIDE 40 MG PO TABS: 40.0000 mg | ORAL_TABLET | Freq: Every day | ORAL | 0 refills | Status: DC

## 2023-03-11 NOTE — Progress Notes (Signed)
Unable to reach patient's wife but updated needed maintenance prescriptions.

## 2023-03-15 ENCOUNTER — Encounter (HOSPITAL_BASED_OUTPATIENT_CLINIC_OR_DEPARTMENT_OTHER): Payer: Medicare Other

## 2023-03-15 LAB — TSH

## 2023-03-15 LAB — ALDOSTERONE + RENIN ACTIVITY W/ RATIO: Aldosterone: 1.3 ng/dL (ref 0.0–30.0)

## 2023-03-18 ENCOUNTER — Telehealth (HOSPITAL_BASED_OUTPATIENT_CLINIC_OR_DEPARTMENT_OTHER): Payer: Self-pay | Admitting: Cardiovascular Disease

## 2023-03-18 NOTE — Telephone Encounter (Signed)
Left message for patient to call and discuss reschedule the Renal duplex appointment that was cancelled for 03/15/23

## 2023-03-20 NOTE — Telephone Encounter (Signed)
Left message for patient to call and schedule the Renal Artery duplex ordered by Dr. Duke Salvia

## 2023-03-21 ENCOUNTER — Other Ambulatory Visit: Payer: Self-pay | Admitting: Rheumatology

## 2023-03-21 NOTE — Telephone Encounter (Signed)
Last Fill: 10/16/2022  Labs: 12/18/2022 Patient remains anemic likely secondary to CKD. Creatinine remains elevted-2.20 and GFR is low at 35.Glucose is very elevated at 328.   Uric acid is WNL-5.2.   Next Visit: Due May 2024. Message sent to the front to schedule.   Last Visit: 12/18/2022  DX: Idiopathic chronic gout of multiple sites with tophus   Current Dose per office note 12/18/2022: allopurinol was increased from 200 mg to 300 mg after his last office visit on 10/16/2022   Okay to refill Allopurinol?

## 2023-03-21 NOTE — Telephone Encounter (Signed)
LMOM for patient to reschedule.

## 2023-03-21 NOTE — Telephone Encounter (Signed)
Please schedule patient a follow up visit. Patient due May 2024. Thanks!  

## 2023-03-23 ENCOUNTER — Other Ambulatory Visit: Payer: Self-pay | Admitting: Family Medicine

## 2023-03-27 ENCOUNTER — Telehealth: Payer: Self-pay | Admitting: Family Medicine

## 2023-03-27 MED ORDER — FREESTYLE LIBRE 3 SENSOR MISC: 5 refills | Status: DC

## 2023-03-27 NOTE — Telephone Encounter (Signed)
Pt's wife as some questions about some of his medications and wasn't sure if they needed to speak to pcp or Tammy. She is unsure if he is still to continue taking flomax as she thought it was only for an acute issue back in september. Also he is out of his glucose monitor and needing to get that refilled as well and wasn't sure if this required assistance. She also stated she has been trying to reach Tammy's number but has not been able to get in touch and just wanted to touch base. Please advise.

## 2023-03-27 NOTE — Telephone Encounter (Signed)
Called back. LVM letting patient's wife know per Ladona Ridgel:   as long as he is not having any urinary symptoms currently, he can try stopping the Flomax and see how he does. Follow-up for any signs of urinary retention.   To call with any questions/issues.

## 2023-03-27 NOTE — Telephone Encounter (Signed)
Patient needs refill of Freestyle sensors. Sent to pharmacy.   He was put on Flomax in September after some urinary retention issues through the ER. He has been taking this ever since with no issues. They want to know if he should continue on medication? Please advise.

## 2023-03-28 NOTE — Telephone Encounter (Signed)
Left message with CB# [phone] or [phone].

## 2023-04-01 ENCOUNTER — Encounter: Payer: Self-pay | Admitting: Student

## 2023-04-01 ENCOUNTER — Ambulatory Visit: Payer: Medicare Other | Attending: Cardiology | Admitting: Student

## 2023-04-01 VITALS — BP 124/72 | HR 79

## 2023-04-01 DIAGNOSIS — I10 Essential (primary) hypertension: Secondary | ICD-10-CM | POA: Diagnosis not present

## 2023-04-01 NOTE — Assessment & Plan Note (Signed)
Assessment: BP is controlled in office BP 124/72 mmHg above the goal (<130/80). Takes his BP medications and tolerates them well without any side effects,may forget taking once a week  Does not check BP at home, reports his BP monitor has been validated by PCP Denies SOB, palpitation, chest pain, headaches,or swelling   Plan:  No medication changes  Continue taking  lisinopril 20 mg daily , carvedilol 25 mg twice daily, Norvasc 10 mg , hydralazine 25 mg twice a day. Patient to keep record of BP readings with heart rate and report to [place] at the next visit Patient to see PharmD in 4 weeks for follow up  Follow up lab(s) :none

## 2023-04-01 NOTE — Progress Notes (Signed)
Patient ID: [name]                 DOB: 02-28-1971                      MRN: [id number]      HPI: Matthew Fisher is a 52 y.o. male referred by Dr. Duke Salvia to HTN clinic. PMH is significant for  hypertension, CAD with NSTEMI 10/2017 s/p multivessel PCI of OM1, PDA, PLA ostium, and mid PLA, GERD, CKD IIIb, diabetes, prior tobacco abuse, and gout.  He was seen by Hyman Hopes, NP 12/10/2022 and his blood pressure was 143/78. His lisinopril was increased to 40 mg daily.Patient saw Dr. Duke Salvia on 02/27/2023, BP was elevated, hydralazine 25 mg twice daily was added to lisinopril 40 mg, amlodipine 10 mg and coreg 25 mg twice daily.  Patient presented today for 4 weeks BP follow up. Reports he does not check his BP at home. He took BP almost 2.5 weeks ago he does not remember the reading but recalls it was around  153/89. He does no add salt to his food but necessarily reads each food items labels for sodium content. He uses pill box to track his adherence. Some days he wakes up at noon and forget taking his morning medications. It may happen one a week. He vapes every day. He is going to quit soon. He drinks beers over weekends   Current HTN meds:  lisinopril 20 mg daily , carvedilol 25 mg twice daily, Norvasc 10 mg , hydralazine 25 mg twice a day. BP goal: <130/80  Social History:  Alcohol: 7-10 beers on weekends  Vape - will be quitting soon Diet: low salt home cooked meals   Exercise: walking - 1.5 miles per day every day   Home BP readings:does not check at home recalls one reading from memory  153/89    Wt Readings from Last 3 Encounters:  02/27/23 215 lb 12.8 oz (97.9 kg)  12/18/22 219 lb (99.3 kg)  12/10/22 215 lb 12.8 oz (97.9 kg)   BP Readings from Last 3 Encounters:  04/01/23 124/72  02/27/23 138/76  12/27/22 (!) 148/86   Pulse Readings from Last 3 Encounters:  04/01/23 79  02/27/23 72  12/27/22 83    Renal function: CrCl cannot be calculated (Patient's most recent  lab result is older than the maximum 21 days allowed.).  Past Medical History:  Diagnosis Date   Arthritis    CAD (coronary artery disease)    a. NSTEMI 10/2017 s/p multivessel PCI of the OM1, PDA, PLA ostium and mid PLA.   CKD (chronic kidney disease), stage II    GERD (gastroesophageal reflux disease)    Gout    Hypertension    Insulin dependent diabetes mellitus     Current Outpatient Medications on File Prior to Visit  Medication Sig Dispense Refill   acetaminophen (TYLENOL) 325 MG tablet Take by mouth.     allopurinol (ZYLOPRIM) 300 MG tablet Take 1 tablet by mouth once daily 30 tablet 0   amLODipine (NORVASC) 10 MG tablet Take 1 tablet (10 mg total) by mouth daily. 30 tablet 5   aspirin 81 MG chewable tablet Chew 1 tablet (81 mg total) by mouth daily. 30 tablet 11   atorvastatin (LIPITOR) 80 MG tablet Take 1 tablet (80 mg total) by mouth daily. 90 tablet 01   blood glucose meter kit and supplies KIT Dispense based on patient and insurance preference. Use up to four  times daily as directed. Please include lancets, test strips, control solution. 1 each 0   carvedilol (COREG) 25 MG tablet Take 1 tablet (25 mg total) by mouth 2 (two) times daily with a meal. 180 tablet 0   cholecalciferol 25 MCG (1000 UT) tablet Take by mouth.     Continuous Glucose Sensor (FREESTYLE LIBRE 3 SENSOR) MISC Place 1 sensor on the skin every 14 days. Use to check glucose continuously (type 2 DM with long term insulin use - E11.22, Z79) 2 each 5   diclofenac Sodium (VOLTAREN) 1 % GEL Apply topically.     ezetimibe (ZETIA) 10 MG tablet Take 1 tablet (10 mg total) by mouth daily. 90 tablet 3   ferrous sulfate 325 (65 FE) MG tablet TAKE 1 TABLET BY MOUTH IN THE MORNING 90 tablet 0   furosemide (LASIX) 40 MG tablet Take 1 tablet (40 mg total) by mouth daily. 90 tablet 0   hydrALAZINE (APRESOLINE) 25 MG tablet Take 1 tablet (25 mg total) by mouth in the morning and at bedtime. 270 tablet 3   insulin detemir  (LEVEMIR FLEXTOUCH) 100 UNIT/ML FlexPen Inject 10 Units into the skin at bedtime. Start with 10 units every night. Titrate up by 2 units every 3 days if morning/fasting glucose is higher than 130 consistently. 15 mL 11   Insulin Pen Needle (PEN NEEDLES) 32G X 4 MM MISC 1 each by Does not apply route daily. 100 each 4   lisinopril (ZESTRIL) 20 MG tablet Take 20 mg by mouth daily.     nitroGLYCERIN (NITROSTAT) 0.4 MG SL tablet Place 1 tablet (0.4 mg total) under the tongue every 5 (five) minutes as needed for chest pain. 10 tablet 1   pantoprazole (PROTONIX) 40 MG tablet Take 1 tablet by mouth twice daily 180 tablet 0   sitaGLIPtin (JANUVIA) 50 MG tablet Take 0.5 tablets (25 mg total) by mouth daily. 30 tablet 2   tamsulosin (FLOMAX) 0.4 MG CAPS capsule TAKE 1 CAPSULE BY MOUTH ONCE DAILY AFTER SUPPER 30 capsule 0   No current facility-administered medications on file prior to visit.    No Known Allergies  Blood pressure 124/72, pulse 79, SpO2 99 %.    Hypertension Assessment: BP is controlled in office BP 124/72 mmHg above the goal (<130/80). Takes his BP medications and tolerates them well without any side effects,may forget taking once a week  Does not check BP at home, reports his BP monitor has been validated by PCP Denies SOB, palpitation, chest pain, headaches,or swelling   Plan:  No medication changes  Continue taking  lisinopril 20 mg daily , carvedilol 25 mg twice daily, Norvasc 10 mg , hydralazine 25 mg twice a day. Patient to keep record of BP readings with heart rate and report to [place] at the next visit Patient to see PharmD in 4 weeks for follow up  Follow up lab(s) :none   Thank you  Carmela Hurt, Pharm.D [facility] A Division of [facility] [address], [place], [place] [address]  Phone: [phone]; Fax: [phone]

## 2023-04-01 NOTE — Patient Instructions (Signed)
No medication changes made by your pharmacist Abbie Berling, PharmD at today's visit:     Bring all of your meds, your BP cuff and your record of home blood pressures to your next appointment.    HOW TO TAKE YOUR BLOOD PRESSURE AT HOME  Rest 5 minutes before taking your blood pressure.  Don't smoke or drink caffeinated beverages for at least 30 minutes before. Take your blood pressure before (not after) you eat. Sit comfortably with your back supported and both feet on the floor (don't cross your legs). Elevate your arm to heart level on a table or a desk. Use the proper sized cuff. It should fit smoothly and snugly around your bare upper arm. There should be enough room to slip a fingertip under the cuff. The bottom edge of the cuff should be 1 inch above the crease of the elbow. Ideally, take 3 measurements at one sitting and record the average.  Important lifestyle changes to control high blood pressure  Intervention  Effect on the BP  Lose extra pounds and watch your waistline Weight loss is one of the most effective lifestyle changes for controlling blood pressure. If you're overweight or obese, losing even a small amount of weight can help reduce blood pressure. Blood pressure might go down by about 1 millimeter of mercury (mm Hg) with each kilogram (about 2.2 pounds) of weight lost.  Exercise regularly As a general goal, aim for at least 30 minutes of moderate physical activity every day. Regular physical activity can lower high blood pressure by about 5 to 8 mm Hg.  Eat a healthy diet Eating a diet rich in whole grains, fruits, vegetables, and low-fat dairy products and low in saturated fat and cholesterol. A healthy diet can lower high blood pressure by up to 11 mm Hg.  Reduce salt (sodium) in your diet Even a small reduction of sodium in the diet can improve heart health and reduce high blood pressure by about 5 to 6 mm Hg.  Limit alcohol One drink equals 12 ounces of beer, 5  ounces of wine, or 1.5 ounces of 80-proof liquor.  Limiting alcohol to less than one drink a day for women or two drinks a day for men can help lower blood pressure by about 4 mm Hg.   If you have any questions or concerns please use My Chart to send questions or call the office at [phone]  

## 2023-04-04 ENCOUNTER — Ambulatory Visit (INDEPENDENT_AMBULATORY_CARE_PROVIDER_SITE_OTHER): Payer: Medicare Other | Admitting: *Deleted

## 2023-04-04 ENCOUNTER — Telehealth: Payer: Self-pay | Admitting: Pharmacist

## 2023-04-04 ENCOUNTER — Ambulatory Visit: Payer: Medicare Other

## 2023-04-04 VITALS — BP 129/62 | HR 90

## 2023-04-04 DIAGNOSIS — H35372 Puckering of macula, left eye: Secondary | ICD-10-CM | POA: Diagnosis not present

## 2023-04-04 DIAGNOSIS — E113513 Type 2 diabetes mellitus with proliferative diabetic retinopathy with macular edema, bilateral: Secondary | ICD-10-CM | POA: Diagnosis not present

## 2023-04-04 DIAGNOSIS — Z961 Presence of intraocular lens: Secondary | ICD-10-CM | POA: Diagnosis not present

## 2023-04-04 DIAGNOSIS — Z0000 Encounter for general adult medical examination without abnormal findings: Secondary | ICD-10-CM

## 2023-04-04 DIAGNOSIS — H2512 Age-related nuclear cataract, left eye: Secondary | ICD-10-CM | POA: Diagnosis not present

## 2023-04-04 DIAGNOSIS — H26491 Other secondary cataract, right eye: Secondary | ICD-10-CM | POA: Diagnosis not present

## 2023-04-04 NOTE — Patient Instructions (Signed)
Matthew Fisher , Thank you for taking time to come for your Medicare Wellness Visit. I appreciate your ongoing commitment to your health goals. Please review the following plan we discussed and let me know if I can assist you in the future.   These are the goals we discussed:  Goals   None     This is a list of the screening recommended for you and due dates:  Health Maintenance  Topic Date Due   Hepatitis C Screening  Never done   Zoster (Shingles) Vaccine (1 of 2) Never done   COVID-19 Vaccine (3 - Pfizer risk series) 06/07/2020   DTaP/Tdap/Td vaccine (3 - Td or Tdap) 05/08/2021   Complete foot exam   12/04/2022   Flu Shot  05/09/2023   Eye exam for diabetics  05/24/2023   Hemoglobin A1C  06/12/2023   Yearly kidney function blood test for diabetes  12/27/2023   Yearly kidney health urinalysis for diabetes  01/17/2024   Medicare Annual Wellness Visit  04/03/2024   Colon Cancer Screening  11/24/2031   HIV Screening  Completed   HPV Vaccine  Aged Out     Next appointment: Follow up in one year for your annual wellness visit.  Preventive Care 40-64 Years, Male Preventive care refers to lifestyle choices and visits with your health care provider that can promote health and wellness. What does preventive care include? A yearly physical exam. This is also called an annual well check. Dental exams once or twice a year. Routine eye exams. Ask your health care provider how often you should have your eyes checked. Personal lifestyle choices, including: Daily care of your teeth and gums. Regular physical activity. Eating a healthy diet. Avoiding tobacco and drug use. Limiting alcohol use. Practicing safe sex. Taking low-dose aspirin every day starting at age 9. What happens during an annual well check? The services and screenings done by your health care provider during your annual well check will depend on your age, overall health, lifestyle risk factors, and family history of  disease. Counseling  Your health care provider may ask you questions about your: Alcohol use. Tobacco use. Drug use. Emotional well-being. Home and relationship well-being. Sexual activity. Eating habits. Work and work Astronomer. Screening  You may have the following tests or measurements: Height, weight, and BMI. Blood pressure. Lipid and cholesterol levels. These may be checked every 5 years, or more frequently if you are over 52 years old. Skin check. Lung cancer screening. You may have this screening every year starting at age 68 if you have a 30-pack-year history of smoking and currently smoke or have quit within the past 15 years. Fecal occult blood test (FOBT) of the stool. You may have this test every year starting at age 36. Flexible sigmoidoscopy or colonoscopy. You may have a sigmoidoscopy every 5 years or a colonoscopy every 10 years starting at age 75. Prostate cancer screening. Recommendations will vary depending on your family history and other risks. Hepatitis C blood test. Hepatitis B blood test. Sexually transmitted disease (STD) testing. Diabetes screening. This is done by checking your blood sugar (glucose) after you have not eaten for a while (fasting). You may have this done every 1-3 years. Discuss your test results, treatment options, and if necessary, the need for more tests with your health care provider. Vaccines  Your health care provider may recommend certain vaccines, such as: Influenza vaccine. This is recommended every year. Tetanus, diphtheria, and acellular pertussis (Tdap, Td) vaccine. You may  need a Td booster every 10 years. Zoster vaccine. You may need this after age 73. Pneumococcal 13-valent conjugate (PCV13) vaccine. You may need this if you have certain conditions and have not been vaccinated. Pneumococcal polysaccharide (PPSV23) vaccine. You may need one or two doses if you smoke cigarettes or if you have certain conditions. Talk to your  health care provider about which screenings and vaccines you need and how often you need them. This information is not intended to replace advice given to you by your health care provider. Make sure you discuss any questions you have with your health care provider. Document Released: 10/21/2015 Document Revised: 06/13/2016 Document Reviewed: 07/26/2015 Elsevier Interactive Patient Education  2017 [employer].  Fall Prevention in the Home Falls can cause injuries. They can happen to people of all ages. There are many things you can do to make your home safe and to help prevent falls. What can I do on the outside of my home? Regularly fix the edges of walkways and driveways and fix any cracks. Remove anything that might make you trip as you walk through a door, such as a raised step or threshold. Trim any bushes or trees on the path to your home. Use bright outdoor lighting. Clear any walking paths of anything that might make someone trip, such as rocks or tools. Regularly check to see if handrails are loose or broken. Make sure that both sides of any steps have handrails. Any raised decks and porches should have guardrails on the edges. Have any leaves, snow, or ice cleared regularly. Use sand or salt on walking paths during winter. Clean up any spills in your garage right away. This includes oil or grease spills. What can I do in the bathroom? Use night lights. Install grab bars by the toilet and in the tub and shower. Do not use towel bars as grab bars. Use non-skid mats or decals in the tub or shower. If you need to sit down in the shower, use a plastic, non-slip stool. Keep the floor dry. Clean up any water that spills on the floor as soon as it happens. Remove soap buildup in the tub or shower regularly. Attach bath mats securely with double-sided non-slip rug tape. Do not have throw rugs and other things on the floor that can make you trip. What can I do in the bedroom? Use night  lights. Make sure that you have a light by your bed that is easy to reach. Do not use any sheets or blankets that are too big for your bed. They should not hang down onto the floor. Have a firm chair that has side arms. You can use this for support while you get dressed. Do not have throw rugs and other things on the floor that can make you trip. What can I do in the kitchen? Clean up any spills right away. Avoid walking on wet floors. Keep items that you use a lot in easy-to-reach places. If you need to reach something above you, use a strong step stool that has a grab bar. Keep electrical cords out of the way. Do not use floor polish or wax that makes floors slippery. If you must use wax, use non-skid floor wax. Do not have throw rugs and other things on the floor that can make you trip. What can I do with my stairs? Do not leave any items on the stairs. Make sure that there are handrails on both sides of the stairs and use them.  Fix handrails that are broken or loose. Make sure that handrails are as long as the stairways. Check any carpeting to make sure that it is firmly attached to the stairs. Fix any carpet that is loose or worn. Avoid having throw rugs at the top or bottom of the stairs. If you do have throw rugs, attach them to the floor with carpet tape. Make sure that you have a light switch at the top of the stairs and the bottom of the stairs. If you do not have them, ask someone to add them for you. What else can I do to help prevent falls? Wear shoes that: Do not have high heels. Have rubber bottoms. Are comfortable and fit you well. Are closed at the toe. Do not wear sandals. If you use a stepladder: Make sure that it is fully opened. Do not climb a closed stepladder. Make sure that both sides of the stepladder are locked into place. Ask someone to hold it for you, if possible. Clearly mark and make sure that you can see: Any grab bars or handrails. First and last  steps. Where the edge of each step is. Use tools that help you move around (mobility aids) if they are needed. These include: Canes. Walkers. Scooters. Crutches. Turn on the lights when you go into a dark area. Replace any light bulbs as soon as they burn out. Set up your furniture so you have a clear path. Avoid moving your furniture around. If any of your floors are uneven, fix them. If there are any pets around you, be aware of where they are. Review your medicines with your doctor. Some medicines can make you feel dizzy. This can increase your chance of falling. Ask your doctor what other things that you can do to help prevent falls. This information is not intended to replace advice given to you by your health care provider. Make sure you discuss any questions you have with your health care provider. Document Released: 07/21/2009 Document Revised: 03/01/2016 Document Reviewed: 10/29/2014 Elsevier Interactive Patient Education  2017 [employer].

## 2023-04-04 NOTE — Telephone Encounter (Signed)
Tried to call patient to follow up recent blood glucose readings. Unable to reach patient - LM on VM with CB# [phone] Libre Continuous Glucose Monitor report below (recently he has not worn Continuous Glucose Monitor about 10% of time).

## 2023-04-04 NOTE — Progress Notes (Signed)
Subjective:   Matthew Fisher is a 52 y.o. male who presents for Medicare Annual/Subsequent preventive examination.  Visit Complete: Virtual  I connected with  Matthew Fisher on 04/04/23 by a audio enabled telemedicine application and verified that I am speaking with the correct person using two identifiers.  Patient Location: Home  Provider Location: Office/Clinic  I discussed the limitations of evaluation and management by telemedicine. The patient expressed understanding and agreed to proceed.   Review of Systems     Cardiac Risk Factors include: advanced age (>20men, >17 women);male gender;diabetes mellitus;dyslipidemia;hypertension     Objective:    Today's Vitals   04/04/23 0845  BP: 129/62  Pulse: 90   There is no height or weight on file to calculate BMI.     04/04/2023    8:33 AM 06/16/2022    3:27 PM 06/08/2022    5:44 AM 04/02/2022    3:58 PM 09/25/2021    9:48 PM 09/04/2021   10:19 PM 12/15/2020    5:28 PM  Advanced Directives  Does Patient Have a Medical Advance Directive? No No No No No No No  Would patient like information on creating a medical advance directive? No - Patient declined No - Patient declined No - Patient declined No - Patient declined       Current Medications (verified) Outpatient Encounter Medications as of 04/04/2023  Medication Sig   acetaminophen (TYLENOL) 325 MG tablet Take by mouth.   allopurinol (ZYLOPRIM) 300 MG tablet Take 1 tablet by mouth once daily   amLODipine (NORVASC) 5 MG tablet Take 5 mg by mouth daily.   aspirin 81 MG chewable tablet Chew 1 tablet (81 mg total) by mouth daily.   atorvastatin (LIPITOR) 80 MG tablet Take 1 tablet (80 mg total) by mouth daily.   blood glucose meter kit and supplies KIT Dispense based on patient and insurance preference. Use up to four times daily as directed. Please include lancets, test strips, control solution.   carvedilol (COREG) 25 MG tablet Take 1 tablet (25 mg total) by mouth 2 (two)  times daily with a meal.   cholecalciferol 25 MCG (1000 UT) tablet Take by mouth.   Continuous Glucose Sensor (FREESTYLE LIBRE 3 SENSOR) MISC Place 1 sensor on the skin every 14 days. Use to check glucose continuously (type 2 DM with long term insulin use - E11.22, Z79)   diclofenac Sodium (VOLTAREN) 1 % GEL Apply topically.   ezetimibe (ZETIA) 10 MG tablet Take 1 tablet (10 mg total) by mouth daily.   ferrous sulfate 325 (65 FE) MG tablet TAKE 1 TABLET BY MOUTH IN THE MORNING   furosemide (LASIX) 40 MG tablet Take 1 tablet (40 mg total) by mouth daily.   hydrALAZINE (APRESOLINE) 25 MG tablet Take 1 tablet (25 mg total) by mouth in the morning and at bedtime.   insulin detemir (LEVEMIR FLEXTOUCH) 100 UNIT/ML FlexPen Inject 10 Units into the skin at bedtime. Start with 10 units every night. Titrate up by 2 units every 3 days if morning/fasting glucose is higher than 130 consistently.   Insulin Pen Needle (PEN NEEDLES) 32G X 4 MM MISC 1 each by Does not apply route daily.   lisinopril (ZESTRIL) 40 MG tablet Take 40 mg by mouth daily.   nitroGLYCERIN (NITROSTAT) 0.4 MG SL tablet Place 1 tablet (0.4 mg total) under the tongue every 5 (five) minutes as needed for chest pain.   pantoprazole (PROTONIX) 40 MG tablet Take 1 tablet by mouth twice daily  sitaGLIPtin (JANUVIA) 50 MG tablet Take 0.5 tablets (25 mg total) by mouth daily.   tamsulosin (FLOMAX) 0.4 MG CAPS capsule TAKE 1 CAPSULE BY MOUTH ONCE DAILY AFTER SUPPER (Patient not taking: Reported on 04/04/2023)   [DISCONTINUED] amLODipine (NORVASC) 10 MG tablet Take 1 tablet (10 mg total) by mouth daily.   [DISCONTINUED] lisinopril (ZESTRIL) 20 MG tablet Take 20 mg by mouth daily.   No facility-administered encounter medications on file as of 04/04/2023.    Allergies (verified) Patient has no known allergies.   History: Past Medical History:  Diagnosis Date   Arthritis    CAD (coronary artery disease)    a. NSTEMI 10/2017 s/p multivessel PCI of  the OM1, PDA, PLA ostium and mid PLA.   CKD (chronic kidney disease), stage II    GERD (gastroesophageal reflux disease)    Gout    Hypertension    Insulin dependent diabetes mellitus    Past Surgical History:  Procedure Laterality Date   CORONARY STENT INTERVENTION N/A 10/17/2017   Procedure: CORONARY STENT INTERVENTION;  Surgeon: Corky Crafts, MD;  Location: [facility] INVASIVE CV LAB;  Service: Cardiovascular;  Laterality: N/A;   EYE SURGERY     EYE SURGERY Right 08/2022   laser surgery   FRACTURE SURGERY     right ring finger   KNEE ARTHROSCOPY WITH LATERAL MENISECTOMY Left 01/22/2013   Procedure: LEFT KNEE ARTHROSCOPY DEBRIDEMENT CHONDROPLASTY, LATERAL RELEASE AND partial medial meniscectomy;  Surgeon: Javier Docker, MD;  Location: [facility] ORS;  Service: Orthopedics;  Laterality: Left;   KNEE SURGERY     "removed bone"   LEFT HEART CATH N/A 10/17/2017   Procedure: Left Heart Cath;  Surgeon: Corky Crafts, MD;  Location[facility] INVASIVE CV LAB;  Service: Cardiovascular;  Laterality: N/A;   LEFT HEART CATH AND CORONARY ANGIOGRAPHY N/A 10/15/2017   Procedure: LEFT HEART CATH AND CORONARY ANGIOGRAPHY;  Surgeon: Marykay Lex, MD;  Location: [facility] INVASIVE CV LAB;  Service: Cardiovascular;  Laterality: N/A;   SHOULDER SURGERY Bilateral    Family History  Problem Relation Age of Onset   Diabetes Mother    Cancer Father    Healthy Daughter    Healthy Daughter    Healthy Son    Social History   Socioeconomic History   Marital status: Married    Spouse name: Not on file   Number of children: Not on file   Years of education: Not on file   Highest education level: Not on file  Occupational History   Not on file  Tobacco Use   Smoking status: Former    Packs/day: 0.50    Years: 20.00    Additional pack years: 0.00    Total pack years: 10.00    Types: Cigarettes, Cigars    Quit date: 10/08/2002    Years since quitting: 20.5    Passive exposure: Never   Smokeless tobacco:  Never  Vaping Use   Vaping Use: Every day   Substances: Nicotine, Flavoring  Substance and Sexual Activity   Alcohol use: Yes    Comment: occasional   Drug use: No   Sexual activity: Yes  Other Topics Concern   Not on file  Social History Narrative   Not on file   Social Determinants of Health   Financial Resource Strain: Medium Risk (12/28/2022)   Overall Financial Resource Strain (CARDIA)    Difficulty of Paying Living Expenses: Somewhat hard  Food Insecurity: No Food Insecurity (04/04/2023)   Hunger Vital Sign  Worried About Programme researcher, broadcasting/film/video in the Last Year: Never true    Ran Out of Food in the Last Year: Never true  Transportation Needs: No Transportation Needs (04/04/2023)   PRAPARE - Administrator, Civil Service (Medical): No    Lack of Transportation (Non-Medical): No  Physical Activity: Sufficiently Active (04/04/2023)   Exercise Vital Sign    Days of Exercise per Week: 7 days    Minutes of Exercise per Session: 120 min  Stress: No Stress Concern Present (04/02/2022)   [employer] of Occupational Health - Occupational Stress Questionnaire    Feeling of Stress : Not at all  Social Connections: Moderately Integrated (04/02/2022)   Social Connection and Isolation Panel [NHANES]    Frequency of Communication with Friends and Family: More than three times a week    Frequency of Social Gatherings with Friends and Family: More than three times a week    Attends Religious Services: Never    Database administrator or Organizations: Yes    Attends Engineer, structural: More than 4 times per year    Marital Status: Married  Recent Concern: Social Connections - Moderately Isolated (04/02/2022)   Social Connection and Isolation Panel [NHANES]    Frequency of Communication with Friends and Family: More than three times a week    Frequency of Social Gatherings with Friends and Family: More than three times a week    Attends Religious Services: Never     Database administrator or Organizations: No    Attends Engineer, structural: Never    Marital Status: Married    Tobacco Counseling Counseling given: Not Answered   Clinical Intake:  Pre-visit preparation completed: Yes  Pain : No/denies pain  Nutritional Risks: None Diabetes: Yes CBG done?: No Did pt. bring in CBG monitor from home?: No  How often do you need to have someone help you when you read instructions, pamphlets, or other written materials from your doctor or pharmacy?: 1 - Never  Interpreter Needed?: No  Information entered by :: Donne Anon, CMA   Activities of Daily Living    04/04/2023    8:30 AM  In your present state of health, do you have any difficulty performing the following activities:  Hearing? 0  Vision? 1  Difficulty concentrating or making decisions? 1  Comment "sometimes i forget stuff"  Walking or climbing stairs? 0  Comment knee pain  Dressing or bathing? 0  Doing errands, shopping? 0  Preparing Food and eating ? N  Using the Toilet? N  In the past six months, have you accidently leaked urine? N  Do you have problems with loss of bowel control? N  Managing your Medications? N  Managing your Finances? N  Housekeeping or managing your Housekeeping? N    Patient Care Team: Clayborne Dana, NP as PCP - General (Family Medicine) Georgeanna Lea, MD as PCP - Cardiology (Cardiology) Myra Rude, MD (Inactive) as Consulting Physician (Family Medicine) Pollyann Savoy, MD as Consulting Physician (Rheumatology)  Indicate any recent Medical Services you may have received from other than [facility] providers in the past year (date may be approximate).     Assessment:   This is a routine wellness examination for Matthew Fisher.  Hearing/Vision screen No results found.  Dietary issues and exercise activities discussed:     Goals Addressed   None    Depression Screen    04/04/2023    8:36 AM 12/10/2022  9:01 AM 09/10/2022    11:06 AM 06/12/2022    9:27 AM 04/02/2022    4:00 PM 12/04/2021    2:58 PM  PHQ 2/9 Scores  PHQ - 2 Score 0 0 0 0 0 0    Fall Risk    04/04/2023    8:33 AM 12/10/2022    9:01 AM 09/10/2022   11:06 AM 06/12/2022    9:27 AM 04/02/2022    3:58 PM  Fall Risk   Falls in the past year? 0 0 0 0 0  Number falls in past yr: 0 0 0 0 0  Injury with Fall? 0 0 0 0 0  Risk for fall due to : No Fall Risks No Fall Risks No Fall Risks  No Fall Risks  Follow up Falls evaluation completed Falls evaluation completed Falls evaluation completed Falls evaluation completed Falls evaluation completed    MEDICARE RISK AT HOME:  Medicare Risk at Home - 04/04/23 3086     Any stairs in or around the home? No    If so, are there any without handrails? No    Home free of loose throw rugs in walkways, pet beds, electrical cords, etc? Yes    Adequate lighting in your home to reduce risk of falls? Yes    Life alert? No    Use of a cane, walker or w/c? No    Grab bars in the bathroom? No    Shower chair or bench in shower? No    Elevated toilet seat or a handicapped toilet? No             TIMED UP AND GO:  Was the test performed?  No    Cognitive Function:        04/04/2023    8:38 AM 04/02/2022    4:20 PM  6CIT Screen  What Year? 0 points 0 points  What month? 0 points 0 points  What time? 0 points 0 points  Count back from 20 0 points 0 points  Months in reverse 0 points 0 points  Repeat phrase 0 points 0 points  Total Score 0 points 0 points    Immunizations Immunization History  Administered Date(s) Administered   PFIZER(Purple Top)SARS-COV-2 Vaccination 04/19/2020, 05/10/2020   Tdap 12/12/2004, 05/09/2011    TDAP status: Due, Education has been provided regarding the importance of this vaccine. Advised may receive this vaccine at local pharmacy or Health Dept. Aware to provide a copy of the vaccination record if obtained from local pharmacy or Health Dept. Verbalized acceptance and  understanding.  Flu Vaccine status: Up to date  Pneumococcal vaccine status: Due, Education has been provided regarding the importance of this vaccine. Advised may receive this vaccine at local pharmacy or Health Dept. Aware to provide a copy of the vaccination record if obtained from local pharmacy or Health Dept. Verbalized acceptance and understanding.  Covid-19 vaccine status: Information provided on how to obtain vaccines.   Qualifies for Shingles Vaccine? Yes   Zostavax completed No   Shingrix Completed?: No.    Education has been provided regarding the importance of this vaccine. Patient has been advised to call insurance company to determine out of pocket expense if they have not yet received this vaccine. Advised may also receive vaccine at local pharmacy or Health Dept. Verbalized acceptance and understanding.  Screening Tests Health Maintenance  Topic Date Due   Hepatitis C Screening  Never done   Zoster Vaccines- Shingrix (1 of 2) Never done  COVID-19 Vaccine (3 - Pfizer risk series) 06/07/2020   DTaP/Tdap/Td (3 - Td or Tdap) 05/08/2021   FOOT EXAM  12/04/2022   INFLUENZA VACCINE  05/09/2023   OPHTHALMOLOGY EXAM  05/24/2023   HEMOGLOBIN A1C  06/12/2023   Diabetic kidney evaluation - eGFR measurement  12/27/2023   Diabetic kidney evaluation - Urine ACR  01/17/2024   Medicare Annual Wellness (AWV)  04/03/2024   Colonoscopy  11/24/2031   HIV Screening  Completed   HPV VACCINES  Aged Out    Health Maintenance  Health Maintenance Due  Topic Date Due   Hepatitis C Screening  Never done   Zoster Vaccines- Shingrix (1 of 2) Never done   COVID-19 Vaccine (3 - Pfizer risk series) 06/07/2020   DTaP/Tdap/Td (3 - Td or Tdap) 05/08/2021   FOOT EXAM  12/04/2022    Colorectal cancer screening: Type of screening: Colonoscopy. Completed 11/23/21. Repeat every 10 years  Lung Cancer Screening: (Low Dose CT Chest recommended if Age 14-80 years, 20 pack-year currently smoking OR  have quit w/in 15years.) does not qualify.   Additional Screening:  Hepatitis C Screening: does qualify; Completed N/a  Vision Screening: Recommended annual ophthalmology exams for early detection of glaucoma and other disorders of the eye. Is the patient up to date with their annual eye exam?  Yes  Who is the provider or what is the name of the office in which the patient attends annual eye exams? [facility] If pt is not established with a provider, would they like to be referred to a provider to establish care? No .   Dental Screening: Recommended annual dental exams for proper oral hygiene  Diabetic Foot Exam: Diabetic Foot Exam: Overdue, Pt has been advised about the importance in completing this exam. Pt is scheduled for diabetic foot exam on N/a.  Community Resource Referral / Chronic Care Management: CRR required this visit?  No   CCM required this visit?  No     Plan:     I have personally reviewed and noted the following in the patient's chart:   Medical and social history Use of alcohol, tobacco or illicit drugs  Current medications and supplements including opioid prescriptions. Patient is not currently taking opioid prescriptions. Functional ability and status Nutritional status Physical activity Advanced directives List of other physicians Hospitalizations, surgeries, and ER visits in previous 12 months Vitals Screenings to include cognitive, depression, and falls Referrals and appointments  In addition, I have reviewed and discussed with patient certain preventive protocols, quality metrics, and best practice recommendations. A written personalized care plan for preventive services as well as general preventive health recommendations were provided to patient.     Donne Anon, CMA   04/04/2023   After Visit Summary: (MyChart) Due to this being a telephonic visit, the after visit summary with patients personalized plan was offered to patient via MyChart   Nurse  Notes: None

## 2023-04-10 ENCOUNTER — Other Ambulatory Visit: Payer: Self-pay | Admitting: Pharmacist

## 2023-04-10 DIAGNOSIS — E1165 Type 2 diabetes mellitus with hyperglycemia: Secondary | ICD-10-CM

## 2023-04-10 NOTE — Progress Notes (Signed)
Pharmacy Quality Measure Review  This patient is appearing on a report for being at risk of failing the adherence measure for diabetes medications this calendar year.   Medication: Januvia   Last fill date: 03/21/2023 for 30 day supply Has filled on time but we are trying to get from medication assistance program due to cost. Patient's wife reports she has a letter at home requesting additional information. She will check for letter and get back to me if she has questions.   Medication: Marcelline Deist Last fill date: 12/14/2022 for 30 day supply Patient has not filled Marcelline Deist because he has been approved to get from medication assistance program  Spoke with patient's wife regarding medications and reviewed recent Continuous Glucose Monitor report - see below.  Average blood glucose is still too high.  Mrs. Comolli is not sure if Billie has been taking Levemir as recommended - 8 units daily. If he has been taking Levemir, then recommend increasing by 2 units to 10 units per day. If he has not been taking Levemir, then recommend starting 8 units daily.   Will follow up in 1 month.

## 2023-04-19 ENCOUNTER — Other Ambulatory Visit: Payer: Medicare Other | Admitting: Pharmacist

## 2023-04-19 NOTE — Progress Notes (Signed)
04/19/2023 Name: Matthew Fisher MRN: [id number] DOB: 1971-04-22  Chief Complaint  Patient presents with   Diabetes   Medication Management    adherence    Matthew Fisher is a 52 y.o. year old male who presented for a telephone visit.     They were referred to the pharmacist by their PCP for assistance in managing diabetes, hypertension, hyperlipidemia, medication access, and complex medication management.    Subjective:  Medication Access/Adherence Current Pharmacy:  [facility] [address] [place], [place] - [address] [place] [address] Phone: [phone] Fax: [phone]  [facility] - [place], [place] - [address] [place] [address] Phone: [phone] Fax: [phone]  Patient reports affordability concerns with their medications: Yes  - awaiting application for Januvia; approved for Farxiga medication assistance program.  Patient reports access/transportation concerns to their pharmacy: No  Patient reports adherence concerns with their medications:  Yes       Diabetes: Current medications:   Levemir 8 units daily but patient reports he has not been taking Levemir daily - only takes 15 units as needed when blood glucose is "high".; patient is concerned about getting hypoglycemia so has not been taking Levemir daily.  Januvia 50mg  daily -restarted 12/26/2022.  Farxiga 10mg  daily started 12/26/2022  Current glucose readings: See Continuous Glucose Monitor report below.   Patient's average blood glucose still high with highest blood glucose reading around noon each day.  Noted 1 low on 04/10/2023 - patient report that he too Levemir 15 units on that day due to high blood glucose. Blood glucose dropped to 65 later that night around 9pm.      Discussed GLP agents with patient in past but he declined due to his friend having a bad experience with Ozempic. Sent him information in April 2024 about other  GLP1s to review.   Hypertension: Current medications: lisinopril 40mg  daily, amlodipine 10mg  daily, carvedilol 25mg  twice a day  Patient is checking blood pressure at home. He was not at home during our phone call so he was not able to provide most recent home blood pressure readings.   BP Readings from Last 3 Encounters:  04/04/23 129/62  04/01/23 124/72  02/27/23 138/76    Hyperlipidemia/ASCVD Risk Reduction Current lipid lowering medications:  atorvastatin 80mg  daily and ezetimibe 10mg  daily   LDL has improved from 167 to 107 (12/2022) since started atorvastatin 80mg  daily.  Medication Management: Current adherence strategy: wife has taken over giving medications. Using weekly pill container.  Patient reports  improved  adherence to hypertension medications but he has not been adherent to insulin therapy.  Barriers to adherence:  Multiple comorbidities Complex medication regimen Poor knowledge of the illness and medication. Independent pausing, stopping or controlling of the medication. Lack of knowledge and confidence in self-management. Low understanding / confidence in medications benefits.   Recent fill dates:  noted that the following maintenance medications have not been filled regularly Januvia - last refill was 01/15/2023 - 30 days Amlodipine - last refill was 07/15/2022 for 90 days - per wife patient has plenty because he had low adherence late 2023 and early 2024 but endorses that he is taking every day now. Carvedilol 25mg  - last refill was 12/20/2022 - 45 days Levemir - last refill was 01/27/2022 for 15 mL (per patient he has about 2 full boxes = 10 pens)  Atorvastatin - last refill was 03/07/204 - 90  days Ezetimibe 10mg  - last refill was 12/20/2022 - 90 days   Objective:   Lab Results  Component Value Date   HGBA1C 7.8 (H) 12/10/2022    Lab Results  Component Value Date   CREATININE 2.52 (H) 12/27/2022   BUN 20 12/27/2022   NA 136 12/27/2022   K  4.2 12/27/2022   CL 104 12/27/2022   CO2 26 12/27/2022    Lab Results  Component Value Date   CHOL 172 12/10/2022   HDL 44.30 12/10/2022   LDLCALC 107 (H) 12/10/2022   TRIG 102.0 12/10/2022   CHOLHDL 4 12/10/2022    Medications Reviewed Today     Reviewed by Henrene Pastor, RPH-CPP (Pharmacist) on 04/19/23 at 1351  Med List Status: <None>   Medication Order Taking? Sig Documenting Provider Last Dose Status Informant  acetaminophen (TYLENOL) 325 MG tablet [id number]  Take by mouth. [provider]  Active   allopurinol (ZYLOPRIM) 300 MG tablet [id number] Yes Take 1 tablet by mouth once daily Gearldine Bienenstock, PA-C Taking Active   amLODipine (NORVASC) 10 MG tablet [id number] Yes Take 10 mg by mouth daily. [provider] Taking Active   aspirin 81 MG chewable tablet [id number] Yes Chew 1 tablet (81 mg total) by mouth daily. Sondra Barges, PA-C Taking Active Self  atorvastatin (LIPITOR) 80 MG tablet [id number] Yes Take 1 tablet (80 mg total) by mouth daily. Henrene Pastor, RPH-CPP Taking Active   blood glucose meter kit and supplies KIT [id number] No Dispense based on patient and insurance preference. Use up to four times daily as directed. Please include lancets, test strips, control solution.  Patient not taking: Reported on 04/19/2023   Clayborne Dana, NP Not Taking Active   carvedilol (COREG) 25 MG tablet [id number] Yes Take 1 tablet (25 mg total) by mouth 2 (two) times daily with a meal. Bradd Canary, MD Taking Active   cholecalciferol 25 MCG (1000 UT) tablet [id number] Yes Take by mouth. [provider] Taking Active   Continuous Glucose Sensor (FREESTYLE LIBRE 3 SENSOR) [place] [id number] Yes Place 1 sensor on the skin every 14 days. Use to check glucose continuously (type 2 DM with long term insulin use - E11.22, Z79) Clayborne Dana, NP Taking Active   diclofenac Sodium (VOLTAREN) 1 % GEL [id number]  Apply topically. [provider]  Active   ezetimibe (ZETIA)  10 MG tablet [id number] Yes Take 1 tablet (10 mg total) by mouth daily. Bradd Canary, MD Taking Active   ferrous sulfate 325 (65 FE) MG tablet [id number]  TAKE 1 TABLET BY MOUTH IN THE MORNING Clayborne Dana, NP  Active   furosemide (LASIX) 40 MG tablet [id number] Yes Take 1 tablet (40 mg total) by mouth daily. Henrene Pastor, RPH-CPP Taking Active   hydrALAZINE (APRESOLINE) 25 MG tablet [id number] Yes Take 1 tablet (25 mg total) by mouth in the morning and at bedtime. Chilton Si, MD Taking Active   insulin detemir (LEVEMIR FLEXTOUCH) 100 UNIT/ML FlexPen [id number] No Inject 10 Units into the skin at bedtime. Start with 10 units every night. Titrate up by 2 units every 3 days if morning/fasting glucose is higher than 130 consistently.  Patient not taking: Reported on 04/19/2023   Clayborne Dana, NP Not Taking Active            Med Note Clydie Braun, [place] B   Fri Apr 19, 2023  1:51 PM) Patient reports taking 15 units prn - recommended 5 units daily with plans to  adjust in future.   Insulin Pen Needle (PEN NEEDLES) 32G X 4 MM MISC [id number] Yes 1 each by Does not apply route daily. Clayborne Dana, NP Taking Active   lisinopril (ZESTRIL) 40 MG tablet [id number] Yes Take 40 mg by mouth daily. [provider] Taking Active   nitroGLYCERIN (NITROSTAT) 0.4 MG SL tablet [id number]  Place 1 tablet (0.4 mg total) under the tongue every 5 (five) minutes as needed for chest pain. Clayborne Dana, NP  Active            Med Note (CANTER, KAYLYN D   Tue Jun 12, 2022  9:25 AM) PRN  pantoprazole (PROTONIX) 40 MG tablet [id number]  Take 1 tablet by mouth twice daily Hyman Hopes B, NP  Active   sitaGLIPtin (JANUVIA) 50 MG tablet [id number] Yes Take 0.5 tablets (25 mg total) by mouth daily. Bradd Canary, MD Taking Active   tamsulosin [facility]) 0.4 MG CAPS capsule [id number] No TAKE 1 CAPSULE BY MOUTH ONCE DAILY AFTER SUPPER  Patient not taking: Reported on 04/19/2023   Clayborne Dana, NP Not Taking Active                Assessment/Plan:   Diabetes: not at goal of A1c < 7.0% Continuous Glucose Monitor report shows blood glucose is > 180 over 63% of the time over the last 14 days. (Previously was 79% of time).  - Reviewed Continuous Glucose Monitor report with patient.  - Recommended restart Levemir DAILY - start with 5 units daily. Trying to get patient comfortable with using long acting insulin daily. Plan to transition over the [name] but patient has several pens of Levemir on hand at home and wants to use first.  - Continue Januvia and [place]  Hypertension: Improved - goal < 130/80  CKD + DM  - Reviewed long term cardiovascular and renal outcomes of uncontrolled blood pressure - continue to take lisinopril 40mg  daily, amlodipine 10mg  daily and carvedilol 25mg  twice a day - continue to check blood pressure at home 2 to 3 times per week and record.   Hyperlipidemia/ASCVD Risk Reduction: not at goal of LDL < 70 but has improved recently - Recommend to continue atorvastatin 80mg  daily and ezetimibe 10mg  daily - will continue to follow lipids and adherence   Medication Management: - Current strategy shows improved medication adherence.  - Continue to use weekly pill box to organize medications. Wife will continue to assist with medication administration.   - All meds will be $0 for 2024 except Farxiga $47/mo, Januvia $47/mo (until coverage gap) Insulin will be $35/mo all year. Approved for medication assistance program for Farxiag thru 10/08/2023. Patient has application for Januvia medication assistance program.   Follow Up Plan: 2 to 4 weeks; A1c due in June 2024.   Henrene Pastor, PharmD Clinical Pharmacist [facility]

## 2023-04-21 ENCOUNTER — Other Ambulatory Visit: Payer: Self-pay | Admitting: Family Medicine

## 2023-04-21 DIAGNOSIS — E611 Iron deficiency: Secondary | ICD-10-CM

## 2023-04-22 ENCOUNTER — Telehealth: Payer: Self-pay | Admitting: Cardiology

## 2023-04-22 NOTE — Telephone Encounter (Signed)
Spoke to [place] (Wife) PharmD HTN follow up for 05/01/2023 cancelled will schedule follow up with pharm D later as patient has appointment with Dr. Duke Salvia on July 22,2024

## 2023-04-22 NOTE — Telephone Encounter (Signed)
Wife wants to know if patient can have a telephone visit instead of in-office pharmacist visit on 7/24.

## 2023-04-22 NOTE — Telephone Encounter (Signed)
Pt c/o medication issue:  1. Name of Medication:   Extra Strength Gas X  2. How are you currently taking this medication (dosage and times per day)?   3. Are you having a reaction (difficulty breathing--STAT)?   4. What is your medication issue?   Wife states she has been unable to find Extra Strength Gas X tablets only  Extra Strength Gas X chewable or gel capsules and wants to know if these medications can be used prior to his renal test on 7/22.

## 2023-04-23 ENCOUNTER — Other Ambulatory Visit: Payer: Self-pay

## 2023-04-23 ENCOUNTER — Emergency Department (HOSPITAL_BASED_OUTPATIENT_CLINIC_OR_DEPARTMENT_OTHER)
Admission: EM | Admit: 2023-04-23 | Discharge: 2023-04-23 | Disposition: A | Discharge status: Home / Self Care | Payer: Medicare Other | Attending: Emergency Medicine | Admitting: Emergency Medicine

## 2023-04-23 ENCOUNTER — Encounter (HOSPITAL_BASED_OUTPATIENT_CLINIC_OR_DEPARTMENT_OTHER): Payer: Self-pay | Admitting: Pediatrics

## 2023-04-23 DIAGNOSIS — Z7982 Long term (current) use of aspirin: Secondary | ICD-10-CM | POA: Diagnosis not present

## 2023-04-23 DIAGNOSIS — S199XXA Unspecified injury of neck, initial encounter: Secondary | ICD-10-CM | POA: Diagnosis present

## 2023-04-23 DIAGNOSIS — Y92410 Unspecified street and highway as the place of occurrence of the external cause: Secondary | ICD-10-CM | POA: Diagnosis not present

## 2023-04-23 DIAGNOSIS — S161XXA Strain of muscle, fascia and tendon at neck level, initial encounter: Secondary | ICD-10-CM | POA: Insufficient documentation

## 2023-04-23 DIAGNOSIS — R519 Headache, unspecified: Secondary | ICD-10-CM | POA: Insufficient documentation

## 2023-04-23 MED ORDER — METHOCARBAMOL 500 MG PO TABS: 500.0000 mg | ORAL_TABLET | Freq: Two times a day (BID) | ORAL | 0 refills | Status: DC
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
## 2023-04-23 NOTE — Discharge Instructions (Signed)
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
## 2023-04-29 ENCOUNTER — Ambulatory Visit (INDEPENDENT_AMBULATORY_CARE_PROVIDER_SITE_OTHER): Payer: Medicare Other

## 2023-04-29 DIAGNOSIS — I10 Essential (primary) hypertension: Secondary | ICD-10-CM

## 2023-04-29 DIAGNOSIS — E611 Iron deficiency: Secondary | ICD-10-CM | POA: Diagnosis not present

## 2023-04-29 DIAGNOSIS — I129 Hypertensive chronic kidney disease with stage 1 through stage 4 chronic kidney disease, or unspecified chronic kidney disease: Secondary | ICD-10-CM | POA: Diagnosis not present

## 2023-04-29 DIAGNOSIS — E11649 Type 2 diabetes mellitus with hypoglycemia without coma: Secondary | ICD-10-CM | POA: Diagnosis not present

## 2023-04-29 DIAGNOSIS — M109 Gout, unspecified: Secondary | ICD-10-CM | POA: Diagnosis not present

## 2023-04-29 DIAGNOSIS — N184 Chronic kidney disease, stage 4 (severe): Secondary | ICD-10-CM | POA: Diagnosis not present

## 2023-05-01 ENCOUNTER — Ambulatory Visit: Payer: Medicare Other

## 2023-05-02 ENCOUNTER — Telehealth (HOSPITAL_BASED_OUTPATIENT_CLINIC_OR_DEPARTMENT_OTHER): Payer: Self-pay

## 2023-05-02 NOTE — Telephone Encounter (Signed)
05/02/2023  Call to patient, spoke with his wife (DPR on file). Explained per [employer] is not required for [employer] One sleep study (ref # [id number]).  Provided patient's wife with PIN number for [employer] One sleep study  Mrs. Strength is not sure if patient still has the device.   Instructed Mrs. Ashley Royalty patient should take study within the next few days or he can return the device unopened.  Jim Like MHA RN CCM

## 2023-05-03 ENCOUNTER — Telehealth: Payer: Self-pay | Admitting: Pharmacist
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
## 2023-05-06 NOTE — Telephone Encounter (Signed)
Yes, he'll need an appointment. Please get him to schedule.

## 2023-05-07 NOTE — Telephone Encounter (Signed)
LVM for patient to call back to schedule appt if still having symptoms and interested in scan.

## 2023-05-08 ENCOUNTER — Ambulatory Visit (INDEPENDENT_AMBULATORY_CARE_PROVIDER_SITE_OTHER): Payer: Medicare Other | Admitting: Family Medicine

## 2023-05-08 ENCOUNTER — Other Ambulatory Visit (HOSPITAL_COMMUNITY): Payer: Self-pay

## 2023-05-08 ENCOUNTER — Encounter: Payer: Self-pay | Admitting: Family Medicine

## 2023-05-08 ENCOUNTER — Telehealth: Payer: Self-pay

## 2023-05-08 ENCOUNTER — Ambulatory Visit (HOSPITAL_BASED_OUTPATIENT_CLINIC_OR_DEPARTMENT_OTHER)
Admission: RE | Admit: 2023-05-08 | Discharge: 2023-05-08 | Disposition: A | Discharge status: Home / Self Care | Payer: Medicare Other | Source: Ambulatory Visit | Attending: Family Medicine | Admitting: Family Medicine

## 2023-05-08 DIAGNOSIS — R519 Headache, unspecified: Secondary | ICD-10-CM | POA: Diagnosis not present
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
## 2023-05-08 NOTE — Telephone Encounter (Signed)
-----   Message from Henrene Pastor sent at 05/03/2023  3:46 PM EDT ----- Please mail another [employer]ck / [employer] application to patient / patient's wife.

## 2023-05-08 NOTE — Telephone Encounter (Signed)
Submitted application for JANUVIA to [facility] for patient assistance via online portal.   Phone: [phone]

## 2023-05-09 NOTE — Telephone Encounter (Signed)
Received notification from [facility] regarding approval for JANUVIA. Patient assistance approved from 05/08/23 to 05/07/24.  Medication will ship to patients home.  Phone: [phone]

## 2023-05-30 DIAGNOSIS — H35372 Puckering of macula, left eye: Secondary | ICD-10-CM | POA: Diagnosis not present

## 2023-05-30 DIAGNOSIS — E113513 Type 2 diabetes mellitus with proliferative diabetic retinopathy with macular edema, bilateral: Secondary | ICD-10-CM | POA: Diagnosis not present

## 2023-05-30 LAB — HM DIABETES EYE EXAM

## 2023-06-05 ENCOUNTER — Ambulatory Visit: Payer: Medicare Other | Admitting: Pharmacist

## 2023-06-05 DIAGNOSIS — E782 Mixed hyperlipidemia: Secondary | ICD-10-CM

## 2023-06-05 DIAGNOSIS — I10 Essential (primary) hypertension: Secondary | ICD-10-CM

## 2023-06-05 DIAGNOSIS — I2510 Atherosclerotic heart disease of native coronary artery without angina pectoris: Secondary | ICD-10-CM

## 2023-06-05 DIAGNOSIS — E1165 Type 2 diabetes mellitus with hyperglycemia: Secondary | ICD-10-CM

## 2023-06-05 NOTE — Progress Notes (Signed)
06/06/2023 Name: Matthew Fisher MRN: [id number] DOB: 04/09/1971  Chief Complaint  Patient presents with   Medication Management   Diabetes   Hypertension    Matthew Fisher is a 52 y.o. year old male who presented for a telephone visit.  Spoke with both patient and his wife.    They were referred to the pharmacist by their PCP for assistance in managing diabetes, hypertension, hyperlipidemia, medication access, and complex medication management.    Subjective:  Medication Access/Adherence Current Pharmacy:  [facility] [address] [place], [place] - [address] [place] [address] Phone: [phone] Fax: [phone]  [facility] [place], [place] - [address] [place] [address] Phone: [phone] Fax: [phone]  Patient reports affordability concerns with their medications: Yes  - has been approved to get [place] and Januvia from medication assistance program thru 10/08/2023 Patient reports access/transportation concerns to their pharmacy: No  Patient reports adherence concerns with their medications:  Yes       Diabetes: Current medications:   Levemir 5 units daily  Januvia 50mg  daily -restarted 12/26/2022.  Farxiga 10mg  daily started 12/26/2022  Current glucose readings: None to report. Patient has not been wearing Continuous Glucose Monitor sensor in anticipation of getting MRI but he plans to restart using soon.    Discussed GLP agents with patient in past but he declined due to his friend having a bad experience with Ozempic. Sent him information in April 2024 about other GLP1s to review.   Hypertension: Current medications: lisinopril 40mg  daily, amlodipine 10mg  daily, carvedilol 25mg  twice a day Has hydralazine 25mg  twice a day on his med list but per patient and his wife he has not been taking. Wife was not aware he was still suppose to take hydralazine.   Patient is checking blood pressure at  home. He reports blood glucose at home has been 150's / 70's.   BP Readings from Last 3 Encounters:  05/08/23 (!) 144/71  04/23/23 (!) 161/89  04/04/23 129/62    Hyperlipidemia/ASCVD Risk Reduction Current lipid lowering medications:  atorvastatin 80mg  daily and ezetimibe 10mg  daily   LDL has improved from 167 to 107 (12/2022) since started atorvastatin 80mg  daily.  Medication Management: Current adherence strategy: wife has taken over giving medications. Using weekly pill container.  Patient reports  improved  adherence to hypertension medications but still not 100% compliant with medication regimen.   Barriers to adherence:  Multiple comorbidities Complex medication regimen Poor knowledge of the illness and medication. Independent pausing, stopping or controlling of the medication. Lack of knowledge and confidence in self-management. Low understanding / confidence in medications benefits.   Recent fill dates:  noted that the following maintenance medications have not been filled regularly Januvia - last refill was 05/02/2023 - 30 days; He has received medication assistance program delivery of Matthew Fisher recently and was for 90 DS.  Amlodipine - last refill was 04/14/2023 for 30 days- per wife had low adherence late 2023 and early 2024 but endorses that he is taking every day now. Carvedilol 25mg  - last refill was 01/30/2023 for 90 DS.  Levemir - last refill was 01/27/2022 for 15 mL (per patient he has about 2 full boxes = 9 pens)  Atorvastatin - last refill was 03/20/2023 - 90 days Ezetimibe 10mg  - last refill was 03/17/2023 - 90 days   Objective:   Lab Results  Component Value Date   HGBA1C  7.8 (H) 12/10/2022    Lab Results  Component Value Date   CREATININE 2.52 (H) 12/27/2022   BUN 20 12/27/2022   NA 136 12/27/2022   K 4.2 12/27/2022   CL 104 12/27/2022   CO2 26 12/27/2022    Lab Results  Component Value Date   CHOL 172 12/10/2022   HDL 44.30 12/10/2022    LDLCALC 107 (H) 12/10/2022   TRIG 102.0 12/10/2022   CHOLHDL 4 12/10/2022    Medications Reviewed Today   Medications were not reviewed in this encounter       Assessment/Plan:   Diabetes: not at goal of A1c < 7.0% .  - Restart using Continuous Glucose Monitor   - Recommended contnue Levemir DAILY - start with 5 units daily. Trying to get patient comfortable with using long acting insulin daily. Plan to transition over the [name] but patient has several pens of Levemir on hand at home and wants to use first.  - Continue Januvia and [place]  Hypertension: Not at goal - goal blood pressure < 130/80  CKD + DM  - Reviewed long term cardiovascular and renal outcomes of uncontrolled blood pressure - continue to take lisinopril 40mg  daily, amlodipine 10mg  daily and carvedilol 25mg  twice a day - Restart hydralazine 25mg  twice a day - patient already had refill at [facility].  - continue to check blood pressure at home 2 to 3 times per week and record.   Hyperlipidemia/ASCVD Risk Reduction: not at goal of LDL < 70 but has improved recently - Recommend to continue atorvastatin 80mg  daily and ezetimibe 10mg  daily - will continue to follow lipids and adherence   Medication Management: - Current strategy shows improved medication adherence.  - Continue to use weekly pill box to organize medications. Wife will continue to assist with medication administration.   - All meds will be $0 for 2024 except Farxiga $47/mo, Januvia $47/mo (until coverage gap) Insulin will be $35/mo all year. Approved for medication assistance program for [name] thru 10/08/2023. .  - Coordinated with [facility] to fill need medications - allopurinol, furosemide, carvedilol, hydralazine.   Updated Rx for furosemide.  Meds ordered this encounter  Medications   furosemide (LASIX) 40 MG tablet    Sig: Take 1 tablet (40 mg total) by mouth daily.    Dispense:  90 tablet    Refill:  0    Follow Up Plan: 3 to 4  weeks  Henrene Pastor, PharmD Clinical Pharmacist [facility]

## 2023-06-06 MED ORDER — FUROSEMIDE 40 MG PO TABS: 40.0000 mg | ORAL_TABLET | Freq: Every day | ORAL | 0 refills | Status: DC

## 2023-06-24 ENCOUNTER — Ambulatory Visit: Payer: Medicare Other | Admitting: Pharmacist

## 2023-06-24 DIAGNOSIS — I10 Essential (primary) hypertension: Secondary | ICD-10-CM

## 2023-06-24 DIAGNOSIS — E782 Mixed hyperlipidemia: Secondary | ICD-10-CM

## 2023-06-24 DIAGNOSIS — E1165 Type 2 diabetes mellitus with hyperglycemia: Secondary | ICD-10-CM

## 2023-06-24 NOTE — Progress Notes (Signed)
06/24/2023 Name: Matthew Fisher MRN: [id number] DOB: [birth year]  Chief Complaint  Patient presents with   Diabetes   Hypertension    Matthew Fisher is a 52 y.o. year old male who presented for a telephone visit.  Spoke with both patient and his wife.    They were referred to the pharmacist by their PCP for assistance in managing diabetes, hypertension, hyperlipidemia, medication access, and complex medication management.    Subjective:  Medication Access/Adherence Current Pharmacy:  [facility] [address] [place], [place] - [address] [place] [address] Phone: [phone] Fax: [phone]  [facility] - [place], [place] - [address] [place] [address] Phone: [phone] Fax: [phone]  Patient reports affordability concerns with their medications: Yes  - has been approved to get [place] and Januvia from medication assistance program thru 10/08/2023 Patient reports access/transportation concerns to their pharmacy: No  Patient reports adherence concerns with their medications:  Yes       Diabetes: Current medications:   Levemir 5 units daily  Januvia 50mg  daily -restarted 12/26/2022.  Farxiga 10mg  daily started 12/26/2022  Current glucose readings - has restarted using Continuous Glucose Monitor recently. See recent report below.        Discussed GLP agents with patient in past but he declined due to his friend having a bad experience with Ozempic. Sent him information in April 2024 about other GLP1s to review.   Hypertension: Current medications: lisinopril 40mg  daily per med list but per patient's wife he is taking only 20mg  daily, amlodipine 10mg  daily per med list but per patient's wife he is only taking 5mg  daily, carvedilol 25mg  twice a day and hydralazine 25mg  twice a day  Reviewed last visit with Dr Duke Salvia from 02/27/2023 - she lowered dose of lisinopril from 40mg  to 20mg  daily. Per her  med list he was still to take amlodipine 10mg  daily.  04/01/2023 - appt with PharmD for hypertension at cardiology office. Lists lisinopril 20mg  daily and amlodipine 10mg  daily.  04/19/2023 -  I had noted patient's med list had amlodipine 5mg  and updated to 10mg  daily    Patient has not checked blood pressure at home recently. He does have blood pressure cuff at home but his wife is not sure it is accurate.   BP Readings from Last 3 Encounters:  05/08/23 (!) 144/71  04/23/23 (!) 161/89  04/04/23 129/62    Hyperlipidemia/ASCVD Risk Reduction Current lipid lowering medications:  atorvastatin 80mg  daily and ezetimibe 10mg  daily   LDL has improved from 167 to 107 (12/2022) since started atorvastatin 80mg  daily.  Medication Management: Current adherence strategy: wife has taken over giving medications. Using weekly pill container.  Patient reports     adherence to hypertension medications is better but there has been confusion recently regarding the correct regimen for his blood pressure medications.   Barriers to adherence:  Multiple comorbidities Complex medication regimen Poor knowledge of the illness and medication. Independent pausing, stopping or controlling of the medication. Lack of knowledge and confidence in self-management. Low understanding / confidence in medications benefits.   Recent fill dates:  noted that the following maintenance medications have not been filled regularly Januvia - He has received medication assistance program delivery of Januiva for 90 DS.  Amlodipine - last refill was 06/18/2023 for 30 days. Carvedilol 25mg  - last refill was 06/07/2023 for 90 DS.  Levemir - last refill was 01/27/2022 for  15 mL (per patient he has about 2 full boxes = 9 pens)  Atorvastatin - last refill was 03/20/2023 - 90 days Ezetimibe 10mg  - last refill was 06/12/2023 - 90 days   Objective:   Lab Results  Component Value Date   HGBA1C 7.8 (H) 12/10/2022   BP Readings from  Last 3 Encounters:  05/08/23 (!) 144/71  04/23/23 (!) 161/89  04/04/23 129/62     Lab Results  Component Value Date   CREATININE 2.52 (H) 12/27/2022   BUN 20 12/27/2022   NA 136 12/27/2022   K 4.2 12/27/2022   CL 104 12/27/2022   CO2 26 12/27/2022    Lab Results  Component Value Date   CHOL 172 12/10/2022   HDL 44.30 12/10/2022   LDLCALC 107 (H) 12/10/2022   TRIG 102.0 12/10/2022   CHOLHDL 4 12/10/2022    Medications Reviewed Today     Reviewed by Henrene Pastor, RPH-CPP (Pharmacist) on 06/24/23 at 1346  Med List Status: <None>   Medication Order Taking? Sig Documenting Provider Last Dose Status Informant  acetaminophen (TYLENOL) 325 MG tablet [id number]  Take by mouth. [provider]  Active   allopurinol (ZYLOPRIM) 300 MG tablet [id number] Yes Take 1 tablet by mouth once daily Gearldine Bienenstock, PA-C Taking Active   amLODipine (NORVASC) 10 MG tablet [id number] Yes Take 5 mg by mouth daily. [provider] Taking Active            Med Note Marius Ditch Jun 24, 2023 11:57 AM) Per wife pt is taking only 5mg  daily   aspirin 81 MG chewable tablet [id number] Yes Chew 1 tablet (81 mg total) by mouth daily. Sondra Barges, PA-C Taking Active Self  atorvastatin (LIPITOR) 80 MG tablet [id number] Yes Take 1 tablet (80 mg total) by mouth daily. Henrene Pastor, RPH-CPP Taking Active   blood glucose meter kit and supplies KIT [id number]  Dispense based on patient and insurance preference. Use up to four times daily as directed. Please include lancets, test strips, control solution. Clayborne Dana, NP  Active   carvedilol (COREG) 25 MG tablet [id number] Yes Take 1 tablet (25 mg total) by mouth 2 (two) times daily with a meal. Bradd Canary, MD Taking Active   cholecalciferol 25 MCG (1000 UT) tablet [id number] Yes Take by mouth. [provider] Taking Active   Continuous Glucose Sensor (FREESTYLE LIBRE 3 SENSOR) [place] [id number] Yes Place 1 sensor on the skin every  14 days. Use to check glucose continuously (type 2 DM with long term insulin use - E11.22, Z79) Clayborne Dana, NP Taking Active   dapagliflozin propanediol (FARXIGA) 10 MG TABS tablet [id number] Yes Take 10 mg by mouth daily. [provider] Taking Active            Med Note Marius Ditch Jun 24, 2023  8:30 AM) [place] for [place] and [place] thru 10/08/2023  diclofenac Sodium (VOLTAREN) 1 % GEL [id number]  Apply topically. [provider]  Active   ezetimibe (ZETIA) 10 MG tablet [id number] Yes Take 1 tablet (10 mg total) by mouth daily. Bradd Canary, MD Taking Active   FEROSUL [id number] Fe) MG tablet [id number] Yes TAKE 1 TABLET BY MOUTH IN THE MORNING Clayborne Dana, NP Taking Active   furosemide (LASIX) 40 MG tablet [id number] Yes Take 1 tablet (40 mg total) by mouth daily. Bradd Canary, MD Taking Active   hydrALAZINE (APRESOLINE) 25 MG tablet  [id number] Yes Take 1 tablet (25 mg total) by mouth in the morning and at bedtime. Chilton Si, MD Taking Active   insulin detemir (LEVEMIR FLEXTOUCH) 100 UNIT/ML FlexPen [id number] Yes Inject 10 Units into the skin at bedtime. Start with 10 units every night. Titrate up by 2 units every 3 days if morning/fasting glucose is higher than 130 consistently. Clayborne Dana, NP Taking Active            Med Note Clydie Braun, [place] B   Fri Apr 19, 2023  1:51 PM) Patient reports taking 15 units prn - recommended 5 units daily with plans to adjust in future.   Insulin Pen Needle (PEN NEEDLES) 32G X 4 MM MISC [id number] Yes 1 each by Does not apply route daily. Clayborne Dana, NP Taking Active   lisinopril (ZESTRIL) 40 MG tablet [id number] Yes Take 20 mg by mouth daily. [provider] Taking Active            Med Note Marius Ditch Jun 24, 2023 12:05 PM) Per cardio 03/2023 dose was lowered to 20mg  daily  methocarbamol (ROBAXIN) 500 MG tablet [id number]  Take 1 tablet (500 mg total) by mouth 2 (two) times daily. Prosperi, Christian H,  PA-C  Active   nitroGLYCERIN (NITROSTAT) 0.4 MG SL tablet [id number]  Place 1 tablet (0.4 mg total) under the tongue every 5 (five) minutes as needed for chest pain. Clayborne Dana, NP  Active            Med Note (CANTER, KAYLYN D   Tue Jun 12, 2022  9:25 AM) PRN  pantoprazole (PROTONIX) 40 MG tablet [id number] Yes Take 1 tablet by mouth twice daily Clayborne Dana, NP Taking Active   sitaGLIPtin (JANUVIA) 50 MG tablet [id number] Yes Take 0.5 tablets (25 mg total) by mouth daily. Bradd Canary, MD Taking Active            Med Note Clydie Braun, Glenna Durand   Wed Jun 05, 2023 11:53 AM) Getting from [employer] medication assistance program 04/2023 thru 09/2023  tamsulosin (FLOMAX) 0.4 MG CAPS capsule [id number]  TAKE 1 CAPSULE BY MOUTH ONCE DAILY AFTER SUPPER  Patient not taking: Reported on 04/19/2023   Clayborne Dana, NP  Active               Assessment/Plan:   Diabetes: not at goal of A1c < 7.0% .  - Continue using Continuous Glucose Monitor to check blood glucose at least 3 times per day. - Recommended contnue Levemir DAILY - start with 5 units daily. Still trying to get patient comfortable with using long acting insulin daily. Plan to transition over the [name] but patient still has several pens of Levemir on hand at home and wants to use first.  - Continue Januvia and [place]  Hypertension: Not at goal - goal blood pressure < 130/80  CKD + DM  - Reviewed long term cardiovascular and renal outcomes of uncontrolled blood pressure - continue to take lisinopril 20mg  daily, amlodipine 10mg  daily, carvedilol 25mg  twice a day and hydralazine 25mg  twice a day - restart checking blood pressure at home daily and record for upcoming appointment with Dr Duke Salvia. Patient to bring in blood pressure cuff to check accuracy at 09/24 appointment with Dr Duke Salvia. If his blood pressure cuff is not validated, then he should be able to purchase another with his 4th quarter over-the-counter benefits with [facility].    Hyperlipidemia/ASCVD Risk Reduction: not  at goal of LDL < 70 but has improved recently - Recommend to continue atorvastatin 80mg  daily and ezetimibe 10mg  daily. Reminded that atorvastatin refill is past due - per wife he had some from earlier in 2024 when he was not taking per instructions  - will continue to follow lipids and adherence   Medication Management: - Current strategy shows improved medication adherence.  - Continue to use weekly pill box to organize medications. Wife will continue to assist with medication administration.   - All meds will be $0 for 2024 except Farxiga $47/mo, Januvia $47/mo (until coverage gap) Insulin will be $35/mo all year. Approved for medication assistance program for [name] thru 10/08/2023. .    Follow Up Plan: Follow up in 4 weeks.   Henrene Pastor, PharmD Clinical Pharmacist  Primary Care [facility]

## 2023-07-02 ENCOUNTER — Encounter (HOSPITAL_BASED_OUTPATIENT_CLINIC_OR_DEPARTMENT_OTHER): Payer: Medicare Other | Admitting: Cardiovascular Disease

## 2023-07-04 DIAGNOSIS — E113513 Type 2 diabetes mellitus with proliferative diabetic retinopathy with macular edema, bilateral: Secondary | ICD-10-CM | POA: Diagnosis not present

## 2023-07-04 DIAGNOSIS — H35372 Puckering of macula, left eye: Secondary | ICD-10-CM | POA: Diagnosis not present

## 2023-07-08 ENCOUNTER — Other Ambulatory Visit: Payer: Self-pay | Admitting: Pharmacist

## 2023-07-08 MED ORDER — DAPAGLIFLOZIN PROPANEDIOL 10 MG PO TABS: 10.0000 mg | ORAL_TABLET | Freq: Every day | ORAL | 1 refills | Status: DC

## 2023-07-08 NOTE — Telephone Encounter (Signed)
Patient's wife states she has been getting texts from [facility] and Me regarding re-enrollment and also asking for an updated prescription. Mrs. Cortese can submit re-enrollment using the link on the text message.  Sent in updated Rx to [facility]

## 2023-07-10 ENCOUNTER — Other Ambulatory Visit: Payer: Self-pay | Admitting: Physician Assistant

## 2023-07-17 ENCOUNTER — Other Ambulatory Visit: Payer: Self-pay | Admitting: Family Medicine

## 2023-07-17 DIAGNOSIS — E611 Iron deficiency: Secondary | ICD-10-CM

## 2023-07-19 ENCOUNTER — Telehealth: Payer: Self-pay | Admitting: Pharmacist

## 2023-07-19 ENCOUNTER — Encounter: Payer: Self-pay | Admitting: Pharmacist

## 2023-07-19 NOTE — Telephone Encounter (Signed)
Pt's wife came in office wanting to know if pharmacist was able to request for pt a complete kit for BP (BP machine with cuffs) check for pt to have at home, pt's spouse stated it was mentioned during pt's visit with pharmacist. Pt would like to have update about the equipment, please advise, Pt tel [phone].

## 2023-07-19 NOTE — Progress Notes (Signed)
07/19/2023 Name: Matthew Fisher MRN: [id number] DOB: [birth year]  Chief Complaint  Patient presents with   Hypertension   Diabetes    Matthew Fisher is a 52 y.o. year old male. Today I spoke with patient's wife, Matthew Fisher    They were referred to the pharmacist by their PCP for assistance in managing diabetes, hypertension, hyperlipidemia, medication access, and complex medication management.    Subjective:  Medication Access/Adherence Current Pharmacy:  [facility] [address] [place], [place] - [address] [place] [address] Phone: [phone] Fax: [phone]  [facility] - [place], [place] - [address] [place] [address] Phone: [phone] Fax: [phone]  Patient reports affordability concerns with their medications: Yes  - has been approved to get [place] and Januvia from medication assistance program thru 10/08/2023 Patient reports access/transportation concerns to their pharmacy: No  Patient reports adherence concerns with their medications:  Yes       Diabetes: Current medications:   Levemir 5 units daily  Januvia 50mg  daily -restarted 12/26/2022.  Farxiga 10mg  daily started 12/26/2022  Discussed GLP agents with patient in past but he declined due to his friend having a bad experience with Ozempic. Sent him information in April 2024 about other GLP1s to review.   Uses Libre 3 Continuous Glucose Monitor system. The last sensor he uses fell off after 3 days. He has also been having difficulty getting the [place] 3 sensors at his pharmacy due to backorder issues.  CGM Documentation:  Report date 06/25/2023 to 07/08/2023 % Time CGM is active: 16 (goal >=70%) Average Glucose: 191 mg/dL Glucose Management Indicator: unable to determine  Glucose Variability: 20.2 (goal <36%) Time in Range:  - Time above range >250: 9% (typical goal: <5%) - Time above range 181-250: 43% (typical goal <20%) -  Time in range 70-180 48% (typical goal >=70%) - Time below range 54-69: 0% (typical goal <4%) - Time below range: 0% (typical goal <1%)  Observed blood glucose trends: Not enough data from this report to adequately determine trends but looks like blood glucose is still elevated after meals.           Hypertension: Current medications: lisinopril 40mg  daily per med list but per patient's wife he is taking only 20mg  daily, amlodipine 10mg  daily. carvedilol 25mg  twice a day and hydralazine 25mg  twice a day   Patient has not checked blood pressure at home recently. He had a blood pressure cuff at home but has not been accurate. He was suppose to bring in his monitor to check against clinic monitor when he saw Dr Duke Salvia 07/03/2023, but he had to rescheduled appointment and new appt is set for December 2024 .  He would like to purchase another but he usually needs all his over-the-counter benefits each quarter for purchasing Vitamin D, Voltaren / diclofenac gel, aspirin and acetaminophen. He also reports he does not have $40 to purchase on his own.    BP Readings from Last 3 Encounters:  05/08/23 (!) 144/71  04/23/23 (!) 161/89  04/04/23 129/62    Hyperlipidemia/ASCVD Risk Reduction Current lipid lowering medications:  atorvastatin 80mg  daily and ezetimibe 10mg  daily   LDL has improved from 167 to 107 (12/2022) since started atorvastatin 80mg  daily.  Medication Management: Current adherence strategy: wife has taken over giving medications. Using weekly pill container.   Barriers to adherence:  Multiple comorbidities Complex medication regimen Poor knowledge of the  illness and medication. Independent pausing, stopping or controlling of the medication. Lack of knowledge and confidence in self-management. Low understanding / confidence in medications benefits.   Recent fill dates:  noted that the following maintenance medications have not been filled regularly Januvia - He has  received medication assistance program delivery of Januiva for 90 DS.  Amlodipine - last refill was 06/18/2023 for 30 days. Carvedilol 25mg  - last refill was 06/07/2023 for 90 DS.  Levemir - last refill was 01/27/2022 for 15 mL (per patient he has about 2 full boxes = 9 pens)  Atorvastatin - last refill was 06/24/2023 - 90 days Lisinopril - last refill was 07/19/2023 for 90 day supply Ezetimibe 10mg  - last refill was 06/12/2023 - 90 days   Objective:   Lab Results  Component Value Date   HGBA1C 7.8 (H) 12/10/2022   BP Readings from Last 3 Encounters:  05/08/23 (!) 144/71  04/23/23 (!) 161/89  04/04/23 129/62     Lab Results  Component Value Date   CREATININE 2.52 (H) 12/27/2022   BUN 20 12/27/2022   NA 136 12/27/2022   K 4.2 12/27/2022   CL 104 12/27/2022   CO2 26 12/27/2022    Lab Results  Component Value Date   CHOL 172 12/10/2022   HDL 44.30 12/10/2022   LDLCALC 107 (H) 12/10/2022   TRIG 102.0 12/10/2022   CHOLHDL 4 12/10/2022    Medications Reviewed Today     Reviewed by Henrene Pastor, RPH-CPP (Pharmacist) on 07/19/23 at 1521  Med List Status: <None>   Medication Order Taking? Sig Documenting Provider Last Dose Status Informant  acetaminophen (TYLENOL) 325 MG tablet [id number] No Take by mouth. [provider] Taking Active   allopurinol (ZYLOPRIM) 300 MG tablet [id number] No Take 1 tablet by mouth once daily Gearldine Bienenstock, PA-C Taking Active   amLODipine (NORVASC) 10 MG tablet [id number] No Take 5 mg by mouth daily. [provider] Taking Active            Med Note Marius Ditch Jun 24, 2023 11:57 AM) Per wife pt is taking only 5mg  daily   aspirin 81 MG chewable tablet [id number] No Chew 1 tablet (81 mg total) by mouth daily. Sondra Barges, PA-C Taking Active Self  atorvastatin (LIPITOR) 80 MG tablet [id number] No Take 1 tablet (80 mg total) by mouth daily. Henrene Pastor, RPH-CPP Taking Active   blood glucose meter kit and supplies KIT  [id number] No Dispense based on patient and insurance preference. Use up to four times daily as directed. Please include lancets, test strips, control solution. Clayborne Dana, NP Taking Active   carvedilol (COREG) 25 MG tablet [id number] No Take 1 tablet (25 mg total) by mouth 2 (two) times daily with a meal. Bradd Canary, MD Taking Active   cholecalciferol 25 MCG (1000 UT) tablet [id number] No Take by mouth. [provider] Taking Active   Continuous Glucose Sensor (FREESTYLE LIBRE 3 SENSOR) [place] [id number] No Place 1 sensor on the skin every 14 days. Use to check glucose continuously (type 2 DM with long term insulin use - E11.22, Z79) Clayborne Dana, NP Taking Active   dapagliflozin propanediol (FARXIGA) 10 MG TABS tablet [id number]  Take 1 tablet (10 mg total) by mouth daily. Clayborne Dana, NP  Active   diclofenac Sodium (VOLTAREN) 1 % GEL [id number] No Apply topically. [provider] Taking Active   ezetimibe (ZETIA) 10 MG tablet [id number] No Take 1 tablet (  10 mg total) by mouth daily. Bradd Canary, MD Taking Active   FEROSUL 325 [phone]) MG tablet [id number]  TAKE 1 TABLET BY MOUTH IN THE MORNING Clayborne Dana, NP  Active   furosemide (LASIX) 40 MG tablet [id number] No Take 1 tablet (40 mg total) by mouth daily. Bradd Canary, MD Taking Active   hydrALAZINE (APRESOLINE) 25 MG tablet [id number] No Take 1 tablet (25 mg total) by mouth in the morning and at bedtime. Chilton Si, MD Taking Active   insulin detemir (LEVEMIR FLEXTOUCH) 100 UNIT/ML FlexPen [id number] No Inject 10 Units into the skin at bedtime. Start with 10 units every night. Titrate up by 2 units every 3 days if morning/fasting glucose is higher than 130 consistently. Clayborne Dana, NP Taking Active            Med Note Clydie Braun, [place] B   Fri Apr 19, 2023  1:51 PM) Patient reports taking 15 units prn - recommended 5 units daily with plans to adjust in future.   Insulin Pen Needle (PEN NEEDLES) 32G X 4 MM MISC  [id number] No 1 each by Does not apply route daily. Clayborne Dana, NP Taking Active   lisinopril (ZESTRIL) 40 MG tablet [id number]  Take 1 tablet by mouth once daily Clayborne Dana, NP  Active   methocarbamol (ROBAXIN) 500 MG tablet [id number] No Take 1 tablet (500 mg total) by mouth 2 (two) times daily. Prosperi, Christian H, PA-C Taking Active   nitroGLYCERIN (NITROSTAT) 0.4 MG SL tablet [id number] No Place 1 tablet (0.4 mg total) under the tongue every 5 (five) minutes as needed for chest pain. Clayborne Dana, NP Taking Active            Med Note (CANTER, KAYLYN D   Tue Jun 12, 2022  9:25 AM) PRN  pantoprazole (PROTONIX) 40 MG tablet [id number]  Take 1 tablet by mouth twice daily Hyman Hopes B, NP  Active   sitaGLIPtin (JANUVIA) 50 MG tablet [id number] No Take 0.5 tablets (25 mg total) by mouth daily. Bradd Canary, MD Taking Active            Med Note Clydie Braun, Glenna Durand   Wed Jun 05, 2023 11:53 AM) Getting from [employer] medication assistance program 04/2023 thru 09/2023  tamsulosin (FLOMAX) 0.4 MG CAPS capsule [id number] No TAKE 1 CAPSULE BY MOUTH ONCE DAILY AFTER SUPPER  Patient not taking: Reported on 04/19/2023   Clayborne Dana, NP Not Taking Active               Assessment/Plan:   Diabetes: not at goal of A1c < 7.0% .  - Restart using Continuous Glucose Monitor to check blood glucose at least 3 times per day. Verified that [facility] has [place] 3 sensors in stock. Will have PCP send in prescription.  - Recommended start Levemir DAILY - start with 5 units daily. Still trying to get patient comfortable with using long acting insulin daily. Plan to transition over the [name] but patient still has several pens of Levemir on hand at home and wants to use first.  - Continue Januvia and [place]. Provided application for 2025 for medication assistance programs for these medications.   Hypertension: Not at goal - goal blood pressure < 130/80  CKD + DM  - Reviewed long term cardiovascular  and renal outcomes of uncontrolled blood pressure - continue to take lisinopril 20mg  daily, amlodipine 10mg  daily, carvedilol 25mg  twice a day and hydralazine 25mg  twice a  day - restart checking blood pressure at home daily and record    Mr. Pilson has a diagnosis of hypertension and it is medically necessary for them to have access to a home device to monitor blood pressure.  The patient does not have readily available insurance access to a device and cannot afford to purchase a device at this time.  The patient has been counseled that they do not need to continue to receive services from [facility] to receive a device.  The patient will be given a device free of charge if approved by PCP. Will leave blood pressure cuff at front desk for pick up next week.    Hyperlipidemia/ASCVD Risk Reduction: not at goal - LDL < 70 but has improved recently with improved medication adherence.  - Recommend to continue atorvastatin 80mg  daily and ezetimibe 10mg  daily.  If next LDL still > 70 then could consider addition of PCSK9. - will continue to follow lipids and medication adherence   Medication Management: - Current strategy shows improved medication adherence.  - Continue to use weekly pill box to organize medications. Wife will continue to assist with medication administration.   - All meds $0 for 2024 except Farxiga $47/mo, Januvia $47/mo (until coverage gap) Insulin will be $35/mo all year. Approved for medication assistance program for [name] thru 10/08/2023. .    Follow Up Plan:  4 weeks.   Henrene Pastor, PharmD Clinical Pharmacist [facility]

## 2023-07-19 NOTE — Telephone Encounter (Signed)
Opened in error

## 2023-07-19 NOTE — Addendum Note (Signed)
Addended by: Henrene Pastor B on: 07/19/2023 03:52 PM   Modules accepted: Orders

## 2023-07-19 NOTE — Telephone Encounter (Signed)
The patients has a diagnosis of hypertension and it is medically necessary for them to have access to a home device to monitor blood pressure.  The patient does not have readily available insurance access to a device and cannot afford to purchase a device at this time.  The patient has been counseled that they do not need to continue to receive services from [facility] to receive a device.  The patient will be given a device free of charge. Forwarding prescription to be approved by PCP.   Also having trouble getting Libre 3 sensors from his usual pharmacy. [facility] has in stock. Will have PCP send in prescription for [facility] 3 sensor.

## 2023-07-22 MED ORDER — BLOOD PRESSURE CUFF MISC: Status: AC

## 2023-07-22 MED ORDER — FREESTYLE LIBRE 3 SENSOR MISC
5 refills | Status: DC
  Filled 2023-07-22: qty 2, 28d supply, fill #0

## 2023-07-23 ENCOUNTER — Other Ambulatory Visit (HOSPITAL_BASED_OUTPATIENT_CLINIC_OR_DEPARTMENT_OTHER): Payer: Self-pay

## 2023-07-24 ENCOUNTER — Telehealth: Payer: Self-pay | Admitting: Pharmacist

## 2023-07-24 ENCOUNTER — Encounter: Payer: Medicare Other | Admitting: Pharmacist

## 2023-07-24 NOTE — Telephone Encounter (Signed)
Unsuccessful outreach to patient for hypertension and DM follow up. LM on VM of patient's phone and his wife, Erica's phone. CB# [phone]

## 2023-07-24 NOTE — Progress Notes (Signed)
Opened in error - patient did not answer for phone visit.  This encounter was created in error - please disregard.

## 2023-07-29 ENCOUNTER — Other Ambulatory Visit (HOSPITAL_BASED_OUTPATIENT_CLINIC_OR_DEPARTMENT_OTHER): Payer: Self-pay

## 2023-07-29 ENCOUNTER — Other Ambulatory Visit: Payer: Self-pay | Admitting: Neurology

## 2023-07-29 DIAGNOSIS — E1165 Type 2 diabetes mellitus with hyperglycemia: Secondary | ICD-10-CM

## 2023-07-29 MED ORDER — FREESTYLE LIBRE 3 PLUS SENSOR MISC: 1 refills | Status: DC

## 2023-07-31 ENCOUNTER — Other Ambulatory Visit (HOSPITAL_BASED_OUTPATIENT_CLINIC_OR_DEPARTMENT_OTHER): Payer: Self-pay

## 2023-07-31 ENCOUNTER — Telehealth: Payer: Self-pay | Admitting: Family Medicine

## 2023-07-31 NOTE — Telephone Encounter (Signed)
Pt's wife dropped off paperwork. Placed paperwork in your tray

## 2023-08-05 ENCOUNTER — Ambulatory Visit (INDEPENDENT_AMBULATORY_CARE_PROVIDER_SITE_OTHER): Payer: Medicare Other | Admitting: Family Medicine

## 2023-08-05 ENCOUNTER — Encounter: Payer: Self-pay | Admitting: Family Medicine

## 2023-08-05 ENCOUNTER — Ambulatory Visit (HOSPITAL_BASED_OUTPATIENT_CLINIC_OR_DEPARTMENT_OTHER)
Admission: RE | Admit: 2023-08-05 | Discharge: 2023-08-05 | Disposition: A | Discharge status: Home / Self Care | Payer: Medicare Other | Source: Ambulatory Visit | Attending: Family Medicine | Admitting: Family Medicine

## 2023-08-05 ENCOUNTER — Other Ambulatory Visit (HOSPITAL_BASED_OUTPATIENT_CLINIC_OR_DEPARTMENT_OTHER): Payer: Self-pay

## 2023-08-05 VITALS — BP 162/84 | HR 94 | Ht 68.0 in | Wt 228.0 lb

## 2023-08-05 DIAGNOSIS — S61412A Laceration without foreign body of left hand, initial encounter: Secondary | ICD-10-CM | POA: Insufficient documentation

## 2023-08-05 DIAGNOSIS — Z23 Encounter for immunization: Secondary | ICD-10-CM | POA: Diagnosis not present

## 2023-08-05 MED ORDER — DOXYCYCLINE HYCLATE 100 MG PO TABS
100.0000 mg | ORAL_TABLET | Freq: Two times a day (BID) | ORAL | 0 refills | Status: DC
  Filled 2023-08-05: qty 14, 7d supply, fill #0

## 2023-08-05 NOTE — Progress Notes (Signed)
Acute Office Visit  Subjective:     Patient ID: Chuong Vonbank, male    DOB: December 11, 1970, 52 y.o.   MRN: [id number]  Chief Complaint  Patient presents with   Laceration     Patient is in today for left hand wound. He is here today with his wife.     Discussed the use of AI scribe software for clinical note transcription with the patient, who gave verbal consent to proceed.  History of Present Illness   The patient, with an unspecified medical history, presents with a hand wound sustained approximately two weeks ago while smashing a soda can. The patient reports persistent pain, rating it as a constant 7 out of 10, with spikes up to 10. The hand is noted to be swollen, and the patient is able to wiggle all fingers and thumb. The wound has been draining, but not as much as the patient expected. The patient also reports a sensation of heat around the wound. The patient has not been on any recent antibiotics and denies any fever. The patient also reports a history of hypertension and has not been consistent with blood pressure medication.         ROS All review of systems negative except what is listed in the HPI      Objective:    BP (!) 162/84   Pulse 94   Ht 5\' 8"  (1.727 m)   Wt 228 lb (103.4 kg)   SpO2 100%   BMI 34.67 kg/m    Physical Exam Vitals reviewed.  Constitutional:      Appearance: Normal appearance.  Skin:    General: Skin is warm and dry.     Comments: See wound, purulent drainage, induration   Neurological:     Mental Status: He is alert and oriented to person, place, and time.  Psychiatric:        Behavior: Behavior normal.       No results found for any visits on 08/05/23.      Assessment & Plan:   Problem List Items Addressed This Visit   None Visit Diagnoses     Laceration of left hand, foreign body presence unspecified, initial encounter    -  Primary   Relevant Medications   doxycycline (VIBRA-TABS) 100 MG tablet   Other  Relevant Orders   Tdap vaccine greater than or equal to 7yo IM (Completed)   DG Hand Complete Left   WOUND CULTURE         Hand Laceration Approximately 88-week-old hand laceration with signs of infection including pain, swelling, and purulent drainage. No loss of function or sensation. -Order hand X-ray to rule out foreign body. -Start Doxycycline for suspected infection. -Obtain wound culture to guide antibiotic therapy. -Advise warm soapy water washes twice daily, application of Neosporin, and gauze dressing. -Updated Tdap today    Hypertension Blood pressure elevated but improved from previous readings. Patient admits to non-adherence with antihypertensive medication. -Advise patient to take antihypertensive medication as prescribed. -Check blood pressure at next visit.          Meds ordered this encounter  Medications   doxycycline (VIBRA-TABS) 100 MG tablet    Sig: Take 1 tablet (100 mg total) by mouth 2 (two) times daily for 7 days.    Dispense:  14 tablet    Refill:  0    Order Specific Question:   Supervising Provider    Answer:   Danise Edge A [[id number]]    Return for  Friday or Monday for wound check .  Clayborne Dana, NP

## 2023-08-07 ENCOUNTER — Encounter: Payer: Self-pay | Admitting: Pharmacist

## 2023-08-07 LAB — WOUND CULTURE
MICRO NUMBER:: 15655536
SPECIMEN QUALITY:: ADEQUATE

## 2023-08-07 NOTE — Progress Notes (Signed)
08/07/2023 Name: Matthew Fisher MRN: [id number] DOB: 01-08-1971  Chief Complaint  Patient presents with   Medication Access    Matthew Fisher is a 52 y.o. year old male. Today I spoke with patient's wife, Suliman Brisco    They were referred to the pharmacist by their PCP for assistance in managing medication access.    Subjective:  Medication Access/Adherence Current Pharmacy:  [facility] [address] [place], [place] - [address] [place] [address] Phone: [phone] Fax: [phone]  [facility] - [place], [place] - [address] [place] [address] Phone: [phone] Fax: [phone]  [facility] [place] [facility] [address] [place] [address] Phone: [phone] Fax: [phone]  [facility] [address] [place], [place] - [address] [place] [address] Phone: [phone] Fax: [phone]  Patient reports affordability concerns with their medications: Yes  - has been approved to get [place] and Januvia from medication assistance program thru 10/08/2023.  I provided patient with applications for 2025 medication assistance program for [place] and he has returned them to our office.   Patient reports access/transportation concerns to their pharmacy: No  Patient reports adherence concerns with their medications:  Yes       Diabetes: Current medications:   Levemir 5 units daily  Januvia 50mg  daily -restarted 12/26/2022.  Marcelline Deist 10mg  daily -  started 12/26/2022  Hypertension: Current medications: lisinopril 40mg  daily per med list but per patient's wife he is taking only 20mg  daily, amlodipine 10mg  daily. carvedilol 25mg  twice a day and hydralazine 25mg  twice a day  Provided patient with home blood pressure cuff 1 or 2 weeks ago. Noted that blood pressure was elevated at last OV with Hyman Hopes , NP on  08/05/2023 - this was an acute visit for laceration to hand but patient also stated that he had not been taking his blood pressure medications as prescribed.   BP Readings from Last 3 Encounters:  08/05/23 (!) 162/84  05/08/23 (!) 144/71  04/23/23 (!) 161/89     Medication Management: Current adherence strategy: wife has taken over giving medications. Using weekly pill container - adherence better but not optimal yet.   Barriers to adherence:  Multiple comorbidities Complex medication regimen Poor knowledge of the illness and medication. Independent pausing, stopping or controlling of the medication. Lack of knowledge and confidence in self-management. Low understanding / confidence in medications benefits.   Recent fill dates:  noted that the following maintenance medications have not been filled regularly Januvia - He has received medication assistance program delivery of Januiva for 90 DS.  Amlodipine - last refill was 06/18/2023 for 30 days. Carvedilol 25mg  - last refill was 06/07/2023 for 90 DS.  Levemir - last refill was 01/27/2022 for 15 mL (per patient he has about 2 full boxes = 9 pens)  Atorvastatin - last refill was 06/24/2023 - 90 days Lisinopril - last refill was 07/19/2023 for 90 day supply Ezetimibe 10mg  - last refill was 06/12/2023 - 90 days   Objective:   Lab Results  Component Value Date   HGBA1C 7.8 (H) 12/10/2022   BP Readings from Last 3 Encounters:  08/05/23 (!) 162/84  05/08/23 (!) 144/71  04/23/23 (!) 161/89     Lab Results  Component Value Date   CREATININE 2.52 (H) 12/27/2022   BUN 20 12/27/2022  NA 136 12/27/2022   K 4.2 12/27/2022   CL 104 12/27/2022   CO2 26 12/27/2022    Lab Results  Component Value Date   CHOL 172 12/10/2022   HDL 44.30 12/10/2022   LDLCALC 107 (H) 12/10/2022   TRIG 102.0 12/10/2022   CHOLHDL 4 12/10/2022    Medications Reviewed Today     Reviewed by Henrene Pastor, RPH-CPP (Pharmacist) on 08/07/23 at  0945  Med List Status: <None>   Medication Order Taking? Sig Documenting Provider Last Dose Status Informant  acetaminophen (TYLENOL) 325 MG tablet [id number] No Take by mouth. [provider] Taking Active   allopurinol (ZYLOPRIM) 300 MG tablet [id number] No Take 1 tablet by mouth once daily Gearldine Bienenstock, PA-C Taking Active   amLODipine (NORVASC) 10 MG tablet [id number] No Take 5 mg by mouth daily. [provider] Taking Active            Med Note Marius Ditch Jun 24, 2023 11:57 AM) Per wife pt is taking only 5mg  daily   aspirin 81 MG chewable tablet [id number] No Chew 1 tablet (81 mg total) by mouth daily. Sondra Barges, PA-C Taking Active Self  atorvastatin (LIPITOR) 80 MG tablet [id number] No Take 1 tablet (80 mg total) by mouth daily. Henrene Pastor, RPH-CPP Taking Active   blood glucose meter kit and supplies KIT [id number] No Dispense based on patient and insurance preference. Use up to four times daily as directed. Please include lancets, test strips, control solution. Clayborne Dana, NP Taking Active   Blood Pressure Monitoring (BLOOD PRESSURE CUFF) MISC [id number]  Use to check blood pressure daily Hyman Hopes B, NP  Active   carvedilol (COREG) 25 MG tablet [id number] No Take 1 tablet (25 mg total) by mouth 2 (two) times daily with a meal. Bradd Canary, MD Taking Active   cholecalciferol 25 MCG (1000 UT) tablet [id number] No Take by mouth. [provider] Taking Active   Continuous Glucose Sensor (FREESTYLE LIBRE 3 PLUS SENSOR) MISC [id number]  Change sensor every 15 days, to check blood sugars continuously Hyman Hopes B, NP  Active   dapagliflozin propanediol (FARXIGA) 10 MG TABS tablet [id number]  Take 1 tablet (10 mg total) by mouth daily. Clayborne Dana, NP  Active            Med Note Clydie Braun, [place] B   Fri Jul 19, 2023  3:21 PM) [place] thru 10/08/2023  diclofenac Sodium (VOLTAREN) 1 % GEL [id number] No Apply topically. [provider] Taking Active   doxycycline (VIBRA-TABS) 100 MG tablet [id number]  Take 1 tablet (100 mg total) by mouth 2 (two) times daily for 7 days. Clayborne Dana, NP  Active   ezetimibe (ZETIA) 10 MG tablet [id number] No Take 1 tablet (10 mg total) by mouth daily. Bradd Canary, MD Taking Active   FEROSUL 325 [phone]) MG tablet [id number]  TAKE 1 TABLET BY MOUTH IN THE MORNING Clayborne Dana, NP  Active   furosemide (LASIX) 40 MG tablet [id number] No Take 1 tablet (40 mg total) by mouth daily. Bradd Canary, MD Taking Active   hydrALAZINE (APRESOLINE) 25 MG tablet [id number] No Take 1 tablet (25 mg total) by mouth in the morning and at bedtime. Chilton Si, MD Taking Active   insulin detemir (LEVEMIR FLEXTOUCH) 100 UNIT/ML FlexPen [id number] No Inject 10 Units into the skin at bedtime. Start with 10 units every night. Titrate up  by 2 units every 3 days if morning/fasting glucose is higher than 130 consistently. Clayborne Dana, NP Taking Active            Med Note Clydie Braun, [place] B   Fri Apr 19, 2023  1:51 PM) Patient reports taking 15 units prn - recommended 5 units daily with plans to adjust in future.   Insulin Pen Needle (PEN NEEDLES) 32G X 4 MM MISC [id number] No 1 each by Does not apply route daily. Clayborne Dana, NP Taking Active   lisinopril (ZESTRIL) 40 MG tablet [id number]  Take 1 tablet by mouth once daily  Patient taking differently: Take 20 mg by mouth daily.   Clayborne Dana, NP  Active   methocarbamol (ROBAXIN) 500 MG tablet [id number] No Take 1 tablet (500 mg total) by mouth 2 (two) times daily. Prosperi, Christian H, PA-C Taking Active   nitroGLYCERIN (NITROSTAT) 0.4 MG SL tablet [id number] No Place 1 tablet (0.4 mg total) under the tongue every 5 (five) minutes as needed for chest pain. Clayborne Dana, NP Taking Active            Med Note (CANTER, KAYLYN D   Tue Jun 12, 2022  9:25 AM) PRN  pantoprazole (PROTONIX) 40 MG tablet [id number]  Take 1 tablet by mouth twice daily Hyman Hopes B, NP  Active   sitaGLIPtin (JANUVIA) 50 MG tablet [id number] No Take 0.5 tablets (25 mg total) by mouth daily. Bradd Canary, MD Taking Active            Med Note Clydie Braun, Glenna Durand   Wed Jun 05, 2023 11:53 AM) Getting from [employer] medication assistance program 04/2023 thru 09/2023  tamsulosin (FLOMAX) 0.4 MG CAPS capsule [id number] No TAKE 1 CAPSULE BY MOUTH ONCE DAILY AFTER SUPPER  Patient not taking: Reported on 04/19/2023   Clayborne Dana, NP Not Taking Active               Assessment/Plan:   Diabetes: not at goal of A1c < 7.0% .  - Working on 2025 application for medication assistance program for [place].   Hypertension: Not at goal - goal blood pressure < 130/80  CKD + DM  - Unable to reach patient or his wife today.  - Left message on VM to use blood pressure cuff provided to check blood pressure at home over the next few days. Record and bring to next office visit with PCP.  - Also reminded patient importance of taking all blood pressure medications as prescribed.    Medication Management: - Current strategy shows improved medication adherence but not optimal yet.  - Continue to use weekly pill box to organize medications. Wife will continue to assist with medication administration.   - All meds $0 for 2024 except Farxiga $47/mo, Januvia $47/mo (until coverage gap) Insulin will be $35/mo all year. Approved for medication assistance program for [name] thru 10/08/2023. - Reviewed and completed provider portion of 2025 medication assistance program for [employer] / Januvia and [place] and [place] / Farxiga. Forwarded to PCP to review and sign. [employer] / [employer]ma [employer] will need to be mailed when completed and can fax [place] application.    Follow Up Plan:  4 weeks.   Henrene Pastor, PharmD Clinical Pharmacist [facility]

## 2023-08-07 NOTE — Telephone Encounter (Signed)
Paperwork received - see documentation note about medication assistance program applications for 2025.

## 2023-08-09 ENCOUNTER — Encounter: Payer: Self-pay | Admitting: Family Medicine

## 2023-08-09 ENCOUNTER — Ambulatory Visit (INDEPENDENT_AMBULATORY_CARE_PROVIDER_SITE_OTHER): Payer: Medicare Other | Admitting: Family Medicine

## 2023-08-09 ENCOUNTER — Other Ambulatory Visit (HOSPITAL_BASED_OUTPATIENT_CLINIC_OR_DEPARTMENT_OTHER): Payer: Self-pay

## 2023-08-09 VITALS — BP 156/78 | HR 80 | Temp 97.9°F | Ht 68.0 in | Wt 223.0 lb

## 2023-08-09 DIAGNOSIS — L089 Local infection of the skin and subcutaneous tissue, unspecified: Secondary | ICD-10-CM

## 2023-08-09 DIAGNOSIS — M109 Gout, unspecified: Secondary | ICD-10-CM

## 2023-08-09 DIAGNOSIS — T148XXA Other injury of unspecified body region, initial encounter: Secondary | ICD-10-CM | POA: Diagnosis not present

## 2023-08-09 DIAGNOSIS — I10 Essential (primary) hypertension: Secondary | ICD-10-CM

## 2023-08-09 LAB — BASIC METABOLIC PANEL
BUN: 30 mg/dL — ABNORMAL HIGH (ref 6–23)
CO2: 28 meq/L (ref 19–32)
Calcium: 8.5 mg/dL (ref 8.4–10.5)
Chloride: 101 meq/L (ref 96–112)
Creatinine, Ser: 3.03 mg/dL — ABNORMAL HIGH (ref 0.40–1.50)
GFR: 22.65 mL/min — ABNORMAL LOW (ref >60.00)
Glucose, Bld: 189 mg/dL — ABNORMAL HIGH (ref 70–99)
Potassium: 4.3 meq/L (ref 3.5–5.1)
Sodium: 137 meq/L (ref 135–145)

## 2023-08-09 MED ORDER — AMOXICILLIN-POT CLAVULANATE 500-125 MG PO TABS: 1.0000 | ORAL_TABLET | Freq: Two times a day (BID) | ORAL | 0 refills | Status: DC

## 2023-08-09 NOTE — Assessment & Plan Note (Signed)
Blood pressure remains elevated. Unclear if patient is on Lisinopril 20mg  or 40mg . -Advise patient to take Lisinopril 40mg  daily. -Check blood pressure in 1 week.

## 2023-08-09 NOTE — Progress Notes (Signed)
Acute Office Visit  Subjective:     Patient ID: Matthew Fisher, male    DOB: 1971/08/06, 52 y.o.   MRN: [id number]  Chief Complaint  Patient presents with   Wound Check    HPI Patient is in today for wound check  Discussed the use of AI scribe software for clinical note transcription with the patient, who gave verbal consent to proceed.  History of Present Illness   The patient, with a history of kidney disease and hypertension, presented for a follow-up visit regarding a wound infection. They reported that the wound had drained significantly since starting doxycycline, but noted a persistent hard area around the wound site. The patient was unsure if this was due to an infection.  The patient also reported a flare-up of gout in the hand, which was being managed with allopurinol. They were taking their blood pressure medication as prescribed, but the blood pressure remained high. The patient was unsure of the exact dosage of their lisinopril medication, but believed it to be 20 mg instead of 40 mg.  The patient was also experiencing issues with their kidney function, but the exact status was unclear as the last check was approximately two months prior by nephrologist, with records not readily available. The patient was unable to access their lab results online, and the most recent labs available to  review were from March.  The patient denied any systemic symptoms such as fevers, chills, or body aches, and reported that all symptoms were localized to the hand. They were applying warm compresses to the wound, which seemed to have improved the condition. The patient reported no significant drainage from the wound at the time of the visit, but noted some drainage upon removing the bandage.          ROS All review of systems negative except what is listed in the HPI      Objective:    BP (!) 156/78   Pulse 80   Temp 97.9 F (36.6 C) (Oral)   Ht 5\' 8"  (1.727 m)   Wt 223 lb  (101.2 kg)   SpO2 97%   BMI 33.91 kg/m    Physical Exam Vitals reviewed.  Constitutional:      Appearance: Normal appearance.  Cardiovascular:     Rate and Rhythm: Normal rate and regular rhythm.  Skin:    Comments: See picture of wound Left 1st finger MCP with erythema, warmth, inflammation, tenderness consistent with gout  Neurological:     Mental Status: He is alert and oriented to person, place, and time.      Results for orders placed or performed in visit on 08/09/23  Basic metabolic panel  Result Value Ref Range   Sodium 137 135 - 145 mEq/L   Potassium 4.3 3.5 - 5.1 mEq/L   Chloride 101 96 - 112 mEq/L   CO2 28 19 - 32 mEq/L   Glucose, Bld 189 (H) 70 - 99 mg/dL   BUN 30 (H) 6 - 23 mg/dL   Creatinine, Ser 6.44 (H) 0.40 - 1.50 mg/dL   GFR 03.47 (L) >42.59 mL/min   Calcium 8.5 8.4 - 10.5 mg/dL   Estimated Creatinine Clearance: 32.5 mL/min (A) (by C-G formula based on SCr of 3.03 mg/dL (H)).      Assessment & Plan:   Problem List Items Addressed This Visit       Active Problems   Hypertension (Chronic)    Blood pressure remains elevated. Unclear if patient is on Lisinopril  20mg  or 40mg . -Advise patient to take Lisinopril 40mg  daily. -Check blood pressure in 1 week.      Gout    Flare noted in hand. Currently on Allopurinol. -Consider additional gout medication based on renal function results. -update: labs back with CrCl >30. Considered trial of colchicine but contraindicated with coreg especially with renal impairment. Hesitant to add prednisone given uncontrolled HTN. Will refer to sports medicine to see if injection would be an option.       Other Visit Diagnoses     Wound infection    -  Primary Wound culture positive for Streptococcus species. Currently on Doxycycline with some improvement but area remains hard and inflamed. -Order renal function tests to guide antibiotic dosing. -Plan to switch to Augmentin - dosing based on renal  function. -Update: CrCl 32.5 - will do slightly lower dose of Augmentin given borderline CrCl   Relevant Orders   Basic metabolic panel (Completed)       Follow-up in 1 week to reassess cellulitis, gout, and hypertension.        Meds ordered this encounter  Medications   amoxicillin-clavulanate (AUGMENTIN) 500-125 MG tablet    Sig: Take 1 tablet by mouth 2 (two) times daily for 7 days.    Dispense:  14 tablet    Refill:  0    Order Specific Question:   Supervising Provider    Answer:   Danise Edge A [[id number]]    Return in about 1 week (around 08/16/2023) for wound check, htn follow-up.  Clayborne Dana, NP

## 2023-08-09 NOTE — Assessment & Plan Note (Signed)
Flare noted in hand. Currently on Allopurinol. -Consider additional gout medication based on renal function results. -update: labs back with CrCl >30. Short trial of colchicine for gout management.

## 2023-08-14 ENCOUNTER — Ambulatory Visit: Payer: Medicare Other | Admitting: Sports Medicine

## 2023-08-14 NOTE — Progress Notes (Signed)
08/14/2023 - patient returned applications for [employer] medication assistance program for [place] and [place] Program for [place]. Applications were reviewed and signed by PCP. [employer] application was mailed today. A copy will be scanned into patient records. [place] application was faxed and will be scanned to patient's record.

## 2023-08-16 ENCOUNTER — Encounter: Payer: Self-pay | Admitting: Family Medicine

## 2023-08-16 ENCOUNTER — Ambulatory Visit (INDEPENDENT_AMBULATORY_CARE_PROVIDER_SITE_OTHER): Payer: Medicare Other | Admitting: Family Medicine

## 2023-08-16 VITALS — BP 130/77 | HR 98 | Ht 68.0 in | Wt 222.0 lb

## 2023-08-16 DIAGNOSIS — I10 Essential (primary) hypertension: Secondary | ICD-10-CM | POA: Diagnosis not present

## 2023-08-16 DIAGNOSIS — Z5189 Encounter for other specified aftercare: Secondary | ICD-10-CM | POA: Diagnosis not present

## 2023-08-16 NOTE — Progress Notes (Signed)
   Acute Office Visit  Subjective:     Patient ID: Matthew Fisher, male    DOB: 1970-12-08, 52 y.o.   MRN: [id number]  Chief Complaint  Patient presents with   Medical Management of Chronic Issues    HPI Patient is in today for wound check.   Discussed the use of AI scribe software for clinical note transcription with the patient, who gave verbal consent to proceed.  History of Present Illness   The patient, with a history of gout and hypertension, presented for a follow-up wound check after changing doxycycline to Augmentin based on culture results. He reported significant improvement in his condition, with the previously inflamed area now completely closed, albeit with a small hard spot in the middle.  He had been monitoring his blood pressure at home, noting some high readings initially but a general trend of improvement over time. The patient was preparing for a trip to the [place] and had been busy with packing.        ROS All review of systems negative except what is listed in the HPI      Objective:    BP 130/77   Pulse 98   Ht 5\' 8"  (1.727 m)   Wt 222 lb (100.7 kg)   SpO2 99%   BMI 33.75 kg/m    Physical Exam Vitals reviewed.  Constitutional:      Appearance: Normal appearance.  Musculoskeletal:     Comments: Wound much improved, small area of induration remains - see picture  Neurological:     Mental Status: He is alert and oriented to person, place, and time.  Psychiatric:        Mood and Affect: Mood normal.        Behavior: Behavior normal.        Thought Content: Thought content normal.        Judgment: Judgment normal.     No results found for any visits on 08/16/23.      Assessment & Plan:   Problem List Items Addressed This Visit       Active Problems   Hypertension (Chronic)   Other Visit Diagnoses     Visit for wound check    -  Primary      Significant improvement with completion of antibiotics.Monitor for new/worsening  symptoms.      BP much improved. Continue current regimen and lifestyle measures     No orders of the defined types were placed in this encounter.   Return in about 3 months (around 11/16/2023) for routine follow-up.  Clayborne Dana, NP

## 2023-09-02 ENCOUNTER — Ambulatory Visit: Payer: Medicare Other | Admitting: Pharmacist

## 2023-09-02 ENCOUNTER — Encounter: Payer: Self-pay | Admitting: Pharmacist

## 2023-09-02 DIAGNOSIS — N182 Chronic kidney disease, stage 2 (mild): Secondary | ICD-10-CM

## 2023-09-02 DIAGNOSIS — E1165 Type 2 diabetes mellitus with hyperglycemia: Secondary | ICD-10-CM

## 2023-09-02 DIAGNOSIS — I10 Essential (primary) hypertension: Secondary | ICD-10-CM

## 2023-09-02 MED ORDER — DAPAGLIFLOZIN PROPANEDIOL 10 MG PO TABS: 10.0000 mg | ORAL_TABLET | Freq: Every day | ORAL | 1 refills | Status: DC

## 2023-09-02 NOTE — Progress Notes (Signed)
09/02/2023 Name: Matthew Fisher MRN: [id number] DOB: 07/26/1971  Chief Complaint  Patient presents with   Diabetes   Hypertension    Matthew Fisher is a 52 y.o. year old male. Today I spoke with patient's wife, Matthew Fisher    They were referred to the pharmacist by their PCP for assistance in managing medication access.    Subjective:  Medication Access/Adherence Current Pharmacy:  [facility] [address] [place], [place] - [address] [place] [address] Phone: [phone] Fax: [phone]  [facility] - [place], [place] - [address] [place] [address] Phone: [phone] Fax: [phone]  [facility] [facility] [address] [place] [address] Phone: [phone] Fax: [phone]  [facility] [address] [place], [place] - [address] [place] [address] Phone: [phone] Fax: [phone]  Patient reports affordability concerns with their medications: Yes  - has been approved to get [place] and Januvia from medication assistance program thru 10/08/2023. We have applied for assistance for 2025 for both [place] and Januvia.  Patient reports access/transportation concerns to their pharmacy: No  Patient reports adherence concerns with their medications:  Yes       Diabetes: Current medications:   Levemir 8 units daily (per patient taking about every other day Januvia 50mg  - take 0.5 tablet = 25mg  daily  Farxiga 10mg  daily   Patient reports blood glucose usually 150 to 200. He has been using Continuous Glucose Monitor Libre sensor but he had to use a different phone and is not connected to our practice.   Hypertension: Current medications: lisinopril 40mg  daily,amlodipine 10mg  daily. carvedilol 25mg  twice a day and hydralazine 25mg  twice a day  Provided patient with home blood pressure cuff last month.   Patient has been checking blood pressure about twice a week.  Lowest blood pressure - 127/ 75 Highest blood pressure - 141/82   BP Readings from Last 3 Encounters:  08/16/23 130/77  08/09/23 (!) 156/78  08/05/23 (!) 162/84     Medication Management: Current adherence strategy: wife has taken over giving medications. Using weekly pill container - adherence better but not optimal yet.   Barriers to adherence:  Multiple comorbidities Complex medication regimen Poor knowledge of the illness and medication. Independent pausing, stopping or controlling of the medication. Lack of knowledge and confidence in self-management. Low understanding / confidence in medications benefits.   Recent fill dates:   Matthew Fisher - He has received medication assistance program delivery of Januiva for 90 DS.  Amlodipine - last refill was 08/12/2023 for 30 days. Carvedilol 25mg  - last refill was 06/07/2023 for 90 DS.  Levemir - last refill was 01/27/2022 for 15 mL (per patient he has about 7 pens remaining)  Atorvastatin - last refill was 06/24/2023 - 90 days Lisinopril - last refill was 07/19/2023 for 90 day supply Ezetimibe 10mg  - last refill was 06/12/2023 - 90 days   Objective:   Lab Results  Component Value Date   HGBA1C 7.8 (H) 12/10/2022   BP Readings from Last 3 Encounters:  08/16/23 130/77  08/09/23 (!) 156/78  08/05/23 (!) 162/84     Lab Results  Component Value Date   CREATININE 3.03 (H) 08/09/2023   BUN 30 (H) 08/09/2023   NA 137 08/09/2023   K 4.3 08/09/2023   CL 101 08/09/2023   CO2 28 08/09/2023  Lab Results  Component Value Date   CHOL 172 12/10/2022   HDL 44.30 12/10/2022   LDLCALC 107 (H) 12/10/2022   TRIG 102.0 12/10/2022   CHOLHDL 4 12/10/2022    Medications Reviewed Today   Medications were not reviewed in this encounter       Assessment/Plan:   Diabetes: not at goal of A1c < 7.0% .  - [place] - patient  has been approved to receive Matthew Fisher thry 10/07/2024 - sent updated Rx to [facility] - Spoke with [employer] medication assistance program - they have received application for Januvia. They sent patient attestation. He will need to sign and return. Representative recommended patient not return attestation until after 09/08/2023 so that he will be considered for 2025.  - Consider GLP1 in the future (Matthew Fisher or Matthew Fisher - would replace Januvia)  - Recommended patient increase Levemir by 1 unit every 2 to 3 days until FBG is < 150.  - Continue Farxiga 10mg  daily and Januvia 25mg  daily (taking 0.5 tablet of 50mg )   - Forwarded instructions on how to reconnect Continuous Glucose Monitor with our practice in MyChart.   Hypertension: Last office blood pressure and home blood pressure has been at goal - goal blood pressure < 130/80  CKD + DM  - continue current blood pressure medications    Medication Management: - Current strategy shows improved medication adherence but not optimal yet.  - Continue to use weekly pill box to organize medications. Wife will continue to assist with medication administration.  - reminded that carvedilol, atorvastatin, ezetimibe, furosemide should be due soon.   Follow Up Plan:  4 to 8  weeks.   Henrene Pastor, PharmD Clinical Pharmacist [facility]

## 2023-09-08 ENCOUNTER — Other Ambulatory Visit: Payer: Self-pay | Admitting: Family Medicine

## 2023-09-08 DIAGNOSIS — I10 Essential (primary) hypertension: Secondary | ICD-10-CM

## 2023-09-09 ENCOUNTER — Other Ambulatory Visit: Payer: Self-pay | Admitting: Pharmacist

## 2023-09-11 ENCOUNTER — Encounter (HOSPITAL_BASED_OUTPATIENT_CLINIC_OR_DEPARTMENT_OTHER): Payer: Medicare Other | Admitting: Cardiovascular Disease

## 2023-09-13 NOTE — Telephone Encounter (Signed)
Care team updated and abstracted eye exam notes.

## 2023-09-24 ENCOUNTER — Other Ambulatory Visit: Payer: Self-pay | Admitting: Pharmacist

## 2023-09-24 DIAGNOSIS — E782 Mixed hyperlipidemia: Secondary | ICD-10-CM

## 2023-09-30 ENCOUNTER — Telehealth: Payer: Self-pay

## 2023-09-30 NOTE — Telephone Encounter (Signed)
PA initiated via Covermymeds; KEY: [id number]. Awaiting determination.

## 2023-10-03 DIAGNOSIS — H2512 Age-related nuclear cataract, left eye: Secondary | ICD-10-CM | POA: Diagnosis not present

## 2023-10-03 DIAGNOSIS — H35353 Cystoid macular degeneration, bilateral: Secondary | ICD-10-CM | POA: Diagnosis not present

## 2023-10-03 DIAGNOSIS — E113513 Type 2 diabetes mellitus with proliferative diabetic retinopathy with macular edema, bilateral: Secondary | ICD-10-CM | POA: Diagnosis not present

## 2023-10-03 DIAGNOSIS — Z961 Presence of intraocular lens: Secondary | ICD-10-CM | POA: Diagnosis not present

## 2023-10-07 NOTE — Telephone Encounter (Signed)
PA approved.   Request Reference Number: [id number]. FREESTY LIBR KIT 2 SENSOR is approved through 10/07/2024. Your patient may now fill this prescription and it will be covered.

## 2023-10-16 ENCOUNTER — Other Ambulatory Visit: Payer: Self-pay | Admitting: Family Medicine

## 2023-10-16 DIAGNOSIS — E611 Iron deficiency: Secondary | ICD-10-CM

## 2023-10-24 DIAGNOSIS — H35372 Puckering of macula, left eye: Secondary | ICD-10-CM | POA: Diagnosis not present

## 2023-10-24 DIAGNOSIS — H2512 Age-related nuclear cataract, left eye: Secondary | ICD-10-CM | POA: Diagnosis not present

## 2023-10-24 DIAGNOSIS — E113513 Type 2 diabetes mellitus with proliferative diabetic retinopathy with macular edema, bilateral: Secondary | ICD-10-CM | POA: Diagnosis not present

## 2023-10-24 DIAGNOSIS — Z961 Presence of intraocular lens: Secondary | ICD-10-CM | POA: Diagnosis not present

## 2023-11-06 ENCOUNTER — Ambulatory Visit (INDEPENDENT_AMBULATORY_CARE_PROVIDER_SITE_OTHER): Payer: Medicare Other | Admitting: Pharmacist

## 2023-11-06 DIAGNOSIS — E1165 Type 2 diabetes mellitus with hyperglycemia: Secondary | ICD-10-CM

## 2023-11-06 DIAGNOSIS — I10 Essential (primary) hypertension: Secondary | ICD-10-CM

## 2023-11-06 NOTE — Progress Notes (Cosign Needed Addendum)
11/06/2023 Name: Matthew Fisher MRN: [id number] DOB: Aug 19, 1971  Chief Complaint  Patient presents with   Diabetes   Medication Management    Matthew Fisher is a 53 y.o. year old male. Today I spoke with patient's wife, Matthew Fisher    They were referred to the pharmacist by their PCP for assistance in managing medication access.    Subjective:  Medication Access/Adherence Current Pharmacy:  [facility] [address] [place], [place] - [address] [place] [address] Phone: [phone] Fax: [phone]  [facility] - [place], [place] - [address] [place] [address] Phone: [phone] Fax: [phone]  [facility] [place] - [facility] [address] [place] [address] Phone: [phone] Fax: [phone]  [facility] [address] [place], [place] - [address] [place] [address] Phone: [phone] Fax: [phone]  Patient reports affordability concerns with their medications: Yes  - has been approved to get [place] and Januvia from medication assistance program thru 10/07/2024.  Last RF of Januvia was 09/23/2024 for #90 of 50mg  - take is actually taking 0.5 tablet = 25mg  due to change in renal function.  Patient reports access/transportation concerns to their pharmacy: No  Patient reports adherence concerns with their medications:  Yes       Diabetes: Current medications:   Levemir 8 units daily Januvia 50mg  - take 0.5 tablet = 25mg  daily  Farxiga 10mg  daily   Was using Continuous Glucose Monitor Libre 3 but patient reports he has had difficulty with his phone logging him out of [place] app and other apps.  Recent report is below - 10/20/2023 to 11/02/2023      CGM Documentation:  Days Worn: 14 (recommend 14 days) % Time CGM is active: 100 (goal >=70%) Average Glucose: 197 mg/dL Glucose Management Indicator:  8.0% Glucose Variability: 23.5 (goal <36%) Time in Range:  - Time above range >250: 13% (typical goal: <5%) - Time above range 181-250: 48% (typical goal <20%) - Time in range 70-180 38% (typical goal >=70%) - Time below range 54-69: 0% (typical goal <4%) - Time below range: 0% (typical goal <1%)   Hypertension: Current medications: lisinopril 40mg  daily, amlodipine 10mg  daily. carvedilol 25mg  twice a day and hydralazine 25mg  twice a day  Provided patient with home blood pressure cuff in 2024 but he reports he has not been checking blood pressure recently. He is due to see Matthew Fisher 11/08/2023  BP Readings from Last 3 Encounters:  08/16/23 130/77  08/09/23 (!) 156/78  08/05/23 (!) 162/84     Medication Management: Current adherence strategy: wife has taken over giving medications. Using weekly pill container - adherence better.  There were a few medications that Matthew Fisher was not sure if he was still taking - ezetimibe, carvedilol and furosemide. I tried to contact his wife to verify but had to leave a message on her VM.   Barriers to adherence:  Multiple comorbidities Complex medication regimen Poor knowledge of the illness and medication. Independent pausing, stopping or controlling of the medication. Lack of knowledge and confidence in self-management. Low understanding / confidence in medications benefits.   Recent fill dates:   Alma Friendly - He has received medication assistance program delivery of Januiva for 90 DS on 09/24/2023 Amlodipine - last refill was 08/12/2023 for 30 days. Carvedilol 25mg  - last refill was 06/07/2023 for 90 DS.  Levemir - last refill was 01/27/2022 for 15 mL (per patient he has about 5 pens remaining)  Atorvastatin - last refill was 09/26/2023- 90 days Lisinopril - last refill was 10/18/2023 for 90 day supply Ezetimibe 10mg  - last refill was 06/12/2023 - 90 days  Health Maintenance - patient had questions about his last colonoscopy. He states  he had two very close together and was not sure why. It appears that he had colonoscopy at [facility] 07/[facility]. An adenomatous polyp was removed and GI wanted to recheck in 4 months to review area of removal, I am assuming to make sure no regrowth. Patient has another colonoscopy 11/2021. All looked clear and it was recommended he have another colonoscopy in 3 years.    Objective:   Lab Results  Component Value Date   HGBA1C 7.8 (H) 12/10/2022   BP Readings from Last 3 Encounters:  08/16/23 130/77  08/09/23 (!) 156/78  08/05/23 (!) 162/84     Lab Results  Component Value Date   CREATININE 3.03 (H) 08/09/2023   BUN 30 (H) 08/09/2023   NA 137 08/09/2023   K 4.3 08/09/2023   CL 101 08/09/2023   CO2 28 08/09/2023    Lab Results  Component Value Date   CHOL 172 12/10/2022   HDL 44.30 12/10/2022   LDLCALC 107 (H) 12/10/2022   TRIG 102.0 12/10/2022   CHOLHDL 4 12/10/2022    Medications Reviewed Today     Reviewed by Henrene Pastor, RPH-CPP (Pharmacist) on 11/06/23 at 1334  Med List Status: <None>   Medication Order Taking? Sig Documenting Provider Last Dose Status Informant  acetaminophen (TYLENOL) 325 MG tablet [id number]  Take by mouth. [provider]  Active   allopurinol (ZYLOPRIM) 300 MG tablet [id number]  Take 1 tablet by mouth once daily  Patient not taking: Reported on 09/02/2023   Gearldine Bienenstock, PA-C  Active   amLODipine (NORVASC) 10 MG tablet [id number]  Take 1 tablet by mouth once daily Bradd Canary, MD  Active   aspirin 81 MG chewable tablet [id number]  Chew 1 tablet (81 mg total) by mouth daily. Sondra Barges, PA-C  Active Self  atorvastatin (LIPITOR) 80 MG tablet [id number]  Take 1 tablet by mouth once daily Zola Button, Yvonne R, DO  Active   blood glucose meter kit and supplies KIT [id number]  Dispense based on patient and insurance preference. Use up to four times daily as directed. Please include lancets, test strips, control solution. Clayborne Dana, NP  Active   Blood Pressure Monitoring (BLOOD PRESSURE CUFF) MISC [id number]  Use to check blood pressure daily Hyman Hopes B, NP  Active   carvedilol (COREG) 25 MG tablet [id number]  TAKE 1 TABLET BY MOUTH TWICE DAILY WITH A MEAL Bradd Canary, MD  Active   cholecalciferol 25 MCG (1000 UT) tablet [id number]  Take by mouth. [provider]  Active   Continuous Glucose Sensor (FREESTYLE LIBRE 3 PLUS SENSOR) MISC [id number]  Change sensor every 15 days, to check blood sugars continuously Hyman Hopes B, NP  Active   dapagliflozin propanediol (FARXIGA) 10 MG TABS tablet [id number]  Take 1 tablet (10 mg total) by mouth daily. Clayborne Dana, NP  Active            Med Note Clydie Braun, Denym Rahimi B   Wed Nov 06, 2023  1:33 PM) Approved to receive from [facility] Program thru 10/07/2024  diclofenac Sodium (VOLTAREN) 1 % GEL [id number]  Apply  topically. [provider]  Active   ezetimibe (ZETIA) 10 MG tablet [id number]  Take 1 tablet (10 mg total) by mouth daily. Bradd Canary, MD  Active   ferrous sulfate (FEROSUL) 325 (65 FE) MG tablet [id number]  Take 1 tablet (325 mg total) by mouth every morning. Clayborne Dana, NP  Active   furosemide (LASIX) 40 MG tablet [id number]  Take 1 tablet by mouth once daily Bradd Canary, MD  Active   hydrALAZINE (APRESOLINE) 25 MG tablet [id number]  Take 1 tablet (25 mg total) by mouth in the morning and at bedtime. Chilton Si, MD  Active   insulin detemir (LEVEMIR FLEXTOUCH) 100 UNIT/ML FlexPen [id number]  Inject 10 Units into the skin at bedtime. Start with 10 units every night. Titrate up by 2 units every 3 days if morning/fasting glucose is higher than 130 consistently.  Patient taking differently: Inject 8 Units into the skin at bedtime. Start with 10 units every night. Titrate up by 2 units every 3 days if morning/fasting glucose is higher than 130 consistently.   Clayborne Dana, NP  Active            Med Note Clydie Braun, Ameri Cahoon B   Mon Sep 02, 2023  2:08  PM)    Insulin Pen Needle (PEN NEEDLES) 32G X 4 MM MISC [id number]  1 each by Does not apply route daily. Clayborne Dana, NP  Active   lisinopril (ZESTRIL) 40 MG tablet [id number]  Take 1 tablet (40 mg total) by mouth daily. Clayborne Dana, NP  Active   methocarbamol (ROBAXIN) 500 MG tablet [id number]  Take 1 tablet (500 mg total) by mouth 2 (two) times daily. Prosperi, Christian H, PA-C  Active   nitroGLYCERIN (NITROSTAT) 0.4 MG SL tablet [id number]  Place 1 tablet (0.4 mg total) under the tongue every 5 (five) minutes as needed for chest pain.  Patient not taking: Reported on 08/16/2023   Clayborne Dana, NP  Active            Med Note (CANTER, [facility] D   Tue Jun 12, 2022  9:25 AM) PRN  pantoprazole (PROTONIX) 40 MG tablet [id number]  Take 1 tablet (40 mg total) by mouth 2 (two) times daily. Clayborne Dana, NP  Active   sitaGLIPtin (JANUVIA) 50 MG tablet [id number]  Take 0.5 tablets (25 mg total) by mouth daily. Bradd Canary, MD  Active            Med Note Clydie Braun, [place] B   Wed Jun 05, 2023 11:53 AM) Getting from [employer] medication assistance program 04/2023 thru 09/2023  tamsulosin (FLOMAX) 0.4 MG CAPS capsule [id number]  TAKE 1 CAPSULE BY MOUTH ONCE DAILY AFTER SUPPER Clayborne Dana, NP  Active               Assessment/Plan:   Diabetes: not at goal of A1c < 7.0% .  - patient is to check with his wife about medication supply and which medications might need refills.  - Recommended patient increase Levemir by 1 unit every 2 to 3 days until FBG is < 150.  - Continue Farxiga 10mg  daily and Januvia 25mg  daily (taking 0.5 tablet of 50mg )   - Spoke with [employer] medication assistance program - He has been approved to receive Januvia for 2025. Per representative he should have at least 45 days left of Januiva.  - Consider GLP1 in the future (Ozempic or Mounjaro - would replace Januvia)   Hypertension: Last  office blood pressure and home blood pressure has been at goal - goal blood pressure < 130/80   CKD + DM  - continue current blood pressure medications  - Patient will follow up with Matthew Fisher 11/08/2023   Medication Management: - Current strategy shows improved medication adherence but not optimal yet.  - Continue to use weekly pill box to organize medications. Wife will continue to assist with medication administration.  - reminded patient to have his wife review medications for needed refills.    Health Maintenance:  - discussed past colonoscopies and reminded patient that GI has recommended next colonoscopy 3 years after his last which was in 11/2021.   Follow Up Plan:  4 weeks.   Henrene Pastor, PharmD Clinical Pharmacist [facility]

## 2023-11-07 DIAGNOSIS — E113513 Type 2 diabetes mellitus with proliferative diabetic retinopathy with macular edema, bilateral: Secondary | ICD-10-CM | POA: Diagnosis not present

## 2023-11-07 DIAGNOSIS — H35353 Cystoid macular degeneration, bilateral: Secondary | ICD-10-CM | POA: Diagnosis not present

## 2023-11-07 DIAGNOSIS — H35372 Puckering of macula, left eye: Secondary | ICD-10-CM | POA: Diagnosis not present

## 2023-11-08 ENCOUNTER — Encounter (HOSPITAL_BASED_OUTPATIENT_CLINIC_OR_DEPARTMENT_OTHER): Payer: Medicare Other | Admitting: Cardiovascular Disease

## 2023-11-11 ENCOUNTER — Ambulatory Visit (INDEPENDENT_AMBULATORY_CARE_PROVIDER_SITE_OTHER): Payer: Medicare Other | Admitting: Family Medicine

## 2023-11-11 ENCOUNTER — Encounter: Payer: Self-pay | Admitting: Family Medicine

## 2023-11-11 VITALS — BP 147/74 | HR 80 | Ht 68.0 in | Wt 225.0 lb

## 2023-11-11 DIAGNOSIS — E1165 Type 2 diabetes mellitus with hyperglycemia: Secondary | ICD-10-CM | POA: Diagnosis not present

## 2023-11-11 DIAGNOSIS — K5900 Constipation, unspecified: Secondary | ICD-10-CM | POA: Diagnosis not present

## 2023-11-11 DIAGNOSIS — I10 Essential (primary) hypertension: Secondary | ICD-10-CM

## 2023-11-11 DIAGNOSIS — Z7984 Long term (current) use of oral hypoglycemic drugs: Secondary | ICD-10-CM

## 2023-11-11 DIAGNOSIS — R142 Eructation: Secondary | ICD-10-CM

## 2023-11-11 MED ORDER — ESOMEPRAZOLE MAGNESIUM 20 MG PO CPDR: 20.0000 mg | DELAYED_RELEASE_CAPSULE | Freq: Two times a day (BID) | ORAL | 3 refills | Status: AC

## 2023-11-11 NOTE — Patient Instructions (Signed)
Switching Protonix to Nexium to see if this makes a difference Try a daily fiber supplement like Benefiber or Metamucil to help prevent constipation If not improving, let me know. Also try getting in with your GI doctor.   BP elevated - nurse visit in 2 weeks.

## 2023-11-11 NOTE — Assessment & Plan Note (Signed)
Blood pressure reading of 147/74 during visit.  -Schedule nurse visit in two weeks to recheck blood pressure.

## 2023-11-11 NOTE — Assessment & Plan Note (Signed)
Lab Results  Component Value Date   HGBA1C 7.8 (H) 12/10/2022  -Patient reports blood sugars are up and down. Following with our pharmacist for med management.  -Check A1C today. -Regular follow-up visit in three months to monitor diabetes control.

## 2023-11-11 NOTE — Progress Notes (Signed)
Acute Office Visit  Subjective:     Patient ID: Matthew Fisher, male    DOB: [birth year], 53 y.o.   MRN: [id number]  Chief Complaint  Patient presents with   Gastroesophageal Reflux     Patient is in today for burping.   Discussed the use of AI scribe software for clinical note transcription with the patient, who gave verbal consent to proceed.  History of Present Illness   The patient presents with burping and constipation.  The patient experiences significant burping, particularly in the mornings, with a sulfur-like odor. They have been taking Protonix twice daily for the past six to seven years, once before breakfast and once before supper, but continue to experience these symptoms. No heartburn, acid reflux, vomiting, or abdominal pain is associated with the burping.  They also experience constipation, sometimes going four to five days without a bowel movement. Bowel movements are occasionally dry and hard, although there was a recent episode of diarrhea after eating ham without bread. They do not take any fiber supplements and sometimes feel bloated, particularly when constipated. He tries to stay well hydrated with water.  They recall having a colonoscopy in October 2023, during which a polyp was removed. They were advised to return in three years for follow-up. No blood in stool or nausea has been noticed.        ROS All review of systems negative except what is listed in the HPI      Objective:    BP (!) 147/74   Pulse 80   Ht 5\' 8"  (1.727 m)   Wt 225 lb (102.1 kg)   SpO2 100%   BMI 34.21 kg/m    Physical Exam Vitals reviewed.  Constitutional:      Appearance: Normal appearance.  Cardiovascular:     Rate and Rhythm: Normal rate and regular rhythm.  Pulmonary:     Effort: Pulmonary effort is normal.  Abdominal:     General: There is no distension.     Palpations: There is no mass.     Tenderness: There is no abdominal tenderness. There is no guarding  or rebound.  Skin:    General: Skin is warm and dry.  Neurological:     Mental Status: He is alert and oriented to person, place, and time.  Psychiatric:        Mood and Affect: Mood normal.        Behavior: Behavior normal.        Thought Content: Thought content normal.        Judgment: Judgment normal.     No results found for any visits on 11/11/23.      Assessment & Plan:   Problem List Items Addressed This Visit       Active Problems   Type 2 diabetes mellitus with hyperglycemia, without long-term current use of insulin (HCC) - Primary (Chronic)   Lab Results  Component Value Date   HGBA1C 7.8 (H) 12/10/2022  -Patient reports blood sugars are up and down. Following with our pharmacist for med management.  -Check A1C today. -Regular follow-up visit in three months to monitor diabetes control.       Relevant Orders   Hemoglobin A1c   Comprehensive metabolic panel   Hypertension (Chronic)   Blood pressure reading of 147/74 during visit.  -Schedule nurse visit in two weeks to recheck blood pressure.      Other Visit Diagnoses       Burping     Reports  of frequent, foul-smelling burping. No associated heartburn or reflux symptoms. Currently on Protonix twice daily before meals for several years. -Switch from Protonix to Nexium, to be taken 30 minutes to an hour before meals. -Follow-up with your GI if symptoms persist -Could consider metoclopramide for possible gastroparesis if not improving (he does have a history of gastroparesis in 2019 per chart review). Last BM today was normal. No severe symptoms requiring imaging today.    Relevant Medications   esomeprazole (NEXIUM) 20 MG capsule     Constipation, unspecified constipation type     Reports of infrequent bowel movements, sometimes going four to five days without a bowel movement. No associated pain or blood in stool. -Start daily Metamucil or Benefiber to promote regular bowel movements. -Use MiraLAX if no  bowel movement for more than two days.   Patient aware of signs/symptoms requiring further/urgent evaluation.        Meds ordered this encounter  Medications   esomeprazole (NEXIUM) 20 MG capsule    Sig: Take 1 capsule (20 mg total) by mouth 2 (two) times daily before a meal.    Dispense:  60 capsule    Refill:  3    Supervising Provider:   Danise Edge A [[id number]]    Return in about 2 weeks (around 11/25/2023) for BP check with nurse; routine follow-up 3 months .  Clayborne Dana, NP

## 2023-11-12 ENCOUNTER — Encounter: Payer: Self-pay | Admitting: Family Medicine

## 2023-11-12 LAB — COMPREHENSIVE METABOLIC PANEL
ALT: 14 U/L (ref 0–53)
AST: 19 U/L (ref 0–37)
Albumin: 4 g/dL (ref 3.5–5.2)
Alkaline Phosphatase: 102 U/L (ref 39–117)
BUN: 40 mg/dL — ABNORMAL HIGH (ref 6–23)
CO2: 23 meq/L (ref 19–32)
Calcium: 8.1 mg/dL — ABNORMAL LOW (ref 8.4–10.5)
Chloride: 104 meq/L (ref 96–112)
Creatinine, Ser: 3.19 mg/dL — ABNORMAL HIGH (ref 0.40–1.50)
GFR: 21.26 mL/min — ABNORMAL LOW (ref >60.00)
Glucose, Bld: 251 mg/dL — ABNORMAL HIGH (ref 70–99)
Potassium: 4.7 meq/L (ref 3.5–5.1)
Sodium: 137 meq/L (ref 135–145)
Total Bilirubin: 0.4 mg/dL (ref 0.2–1.2)
Total Protein: 6.7 g/dL (ref 6.0–8.3)

## 2023-11-12 LAB — HEMOGLOBIN A1C: Hgb A1c MFr Bld: 8.2 % — ABNORMAL HIGH (ref 4.6–6.5)

## 2023-11-12 NOTE — Progress Notes (Signed)
*  please send copy of labs to [facility]  A1c has gone up a little bit. Increase your insulin by 2 units every 3 days if fasting glucose is above 150. Keep next appointment with Tammy to review glucose trends and make further adjustments.   Kidney function is a little below baseline We will send nephrology a copy, but you also need to schedule a follow-up with them. Stay hydrated.  Calcium is low - I will order a recheck - schedule a lab appointment for around the time you come in for BP check in 1-2 weeks.

## 2023-11-12 NOTE — Addendum Note (Signed)
Addended by: Hyman Hopes B on: 11/12/2023 01:26 PM   Modules accepted: Orders

## 2023-11-22 ENCOUNTER — Other Ambulatory Visit: Payer: Self-pay | Admitting: Family Medicine

## 2023-11-25 ENCOUNTER — Other Ambulatory Visit: Payer: Self-pay | Admitting: Family Medicine

## 2023-11-25 ENCOUNTER — Ambulatory Visit: Payer: Self-pay | Admitting: Family Medicine

## 2023-11-25 NOTE — Telephone Encounter (Signed)
 Okay to send

## 2023-11-25 NOTE — Telephone Encounter (Signed)
This was filled for 30 days in June of last year. Should he still be taking this?

## 2023-11-25 NOTE — Telephone Encounter (Signed)
 Sent!

## 2023-11-25 NOTE — Telephone Encounter (Signed)
  Chief Complaint: urinary retention Symptoms: urinary retention Frequency: x couple of weeks Pertinent Negatives: Patient's wife denies blood in urine, painful urination, fever. Disposition: [] ED /[] Urgent Care (no appt availability in office) / [x] Appointment(In office/virtual)/ []  [facility]/ [] Home Care/ [x] Refused Recommended Disposition /[] [facility]/ []  Follow-up with PCP Additional Notes: Patient's wife called in earlier today for flomax refill. Patient last had medication refilled June 2024. Wife states patient has been complaining of urinary retention for the past couple of weeks and they are not interested in an office visit at this time, she would just like the prescription sent.   Reason for Disposition  All other urine symptoms  Answer Assessment - Initial Assessment Questions 1. SYMPTOM: "What's the main symptom you're concerned about?" (e.g., frequency, incontinence)     Urinary retention.  2. ONSET: "When did the  symptoms  start?"     Wife states he mentioned it a couple of weeks ago. She states he has had issues in the past over a year ago and was placed on the flomax, and was told he could come off the medication and to call back for a refill if he became symptomatic again.  3. PAIN: "Is there any pain?" If Yes, ask: "How bad is it?" (Scale: 1-10; mild, moderate, severe)     Denies.  4. CAUSE: "What do you think is causing the symptoms?"     Urinary retention he was seen by a urologist in the past, wife is unsure why or what was causing it.  5. OTHER SYMPTOMS: "Do you have any other symptoms?" (e.g., blood in urine, fever, flank pain, pain with urination)     Denies.  Protocols used: Urinary Symptoms-A-AH

## 2023-11-25 NOTE — Telephone Encounter (Signed)
Copied from CRM [id number]. Topic: Clinical - Medication Refill >> Nov 25, 2023  1:40 PM Corin V wrote: Most Recent Primary Care Visit:  Provider: Clayborne Dana  Department: [facility]  Visit Type: OFFICE VISIT  Date: 11/11/2023  Medication: tamsulosin (FLOMAX) 0.4 MG CAPS capsule  Has the patient contacted their pharmacy? Yes (Agent: If no, request that the patient contact the pharmacy for the refill. If patient does not wish to contact the pharmacy document the reason why and proceed with request.) (Agent: If yes, when and what did the pharmacy advise?)  Is this the correct pharmacy for this prescription? Yes If no, delete pharmacy and type the correct one.  This is the patient's preferred pharmacy:  [id number] - [place], [place] - [address] [place] [address] Phone: [phone] Fax: [phone]  [facility] - [place], [place] - [address] [place] [address] Phone: [phone] Fax: [phone]  [facility] [place] - [facility] [address] [place] [address] Phone: [phone] Fax: [phone]  [facility] [address] [place], [place] - [address] [place] [address] Phone: [phone] Fax: [phone]   Has the prescription been filled recently? No  Is the patient out of the medication? No  Has the patient been seen for an appointment in the last year OR does the patient have an upcoming appointment? Yes  Can we respond through MyChart? No  Agent: Please be advised that Rx refills may take up to 3 business days. We ask that you follow-up with your pharmacy.

## 2023-11-26 ENCOUNTER — Telehealth: Payer: Self-pay | Admitting: *Deleted

## 2023-11-26 NOTE — Telephone Encounter (Signed)
Called to rescheduled lab and nv appointment.  Spoke with pt wife and she stated that they will call back to reschedule they would like to see what the weather is like.

## 2023-11-27 ENCOUNTER — Ambulatory Visit: Payer: Medicare Other

## 2023-11-27 ENCOUNTER — Other Ambulatory Visit: Payer: Medicare Other

## 2023-12-03 DIAGNOSIS — R110 Nausea: Secondary | ICD-10-CM | POA: Diagnosis not present

## 2023-12-03 DIAGNOSIS — R1012 Left upper quadrant pain: Secondary | ICD-10-CM | POA: Diagnosis not present

## 2023-12-03 DIAGNOSIS — Z743 Need for continuous supervision: Secondary | ICD-10-CM | POA: Diagnosis not present

## 2023-12-03 DIAGNOSIS — R0902 Hypoxemia: Secondary | ICD-10-CM | POA: Diagnosis not present

## 2023-12-03 DIAGNOSIS — I2510 Atherosclerotic heart disease of native coronary artery without angina pectoris: Secondary | ICD-10-CM | POA: Diagnosis not present

## 2023-12-03 DIAGNOSIS — R079 Chest pain, unspecified: Secondary | ICD-10-CM | POA: Diagnosis not present

## 2023-12-03 DIAGNOSIS — R1013 Epigastric pain: Secondary | ICD-10-CM | POA: Diagnosis not present

## 2023-12-03 DIAGNOSIS — K5900 Constipation, unspecified: Secondary | ICD-10-CM | POA: Diagnosis not present

## 2023-12-03 DIAGNOSIS — N281 Cyst of kidney, acquired: Secondary | ICD-10-CM | POA: Diagnosis not present

## 2023-12-03 DIAGNOSIS — Z20822 Contact with and (suspected) exposure to covid-19: Secondary | ICD-10-CM | POA: Diagnosis not present

## 2023-12-03 DIAGNOSIS — R0789 Other chest pain: Secondary | ICD-10-CM | POA: Diagnosis not present

## 2023-12-04 ENCOUNTER — Ambulatory Visit (INDEPENDENT_AMBULATORY_CARE_PROVIDER_SITE_OTHER): Payer: Medicare Other | Admitting: Pharmacist

## 2023-12-04 ENCOUNTER — Other Ambulatory Visit: Payer: Medicare Other

## 2023-12-04 ENCOUNTER — Ambulatory Visit (INDEPENDENT_AMBULATORY_CARE_PROVIDER_SITE_OTHER): Payer: Medicare Other | Admitting: Family Medicine

## 2023-12-04 VITALS — BP 110/62 | HR 76

## 2023-12-04 DIAGNOSIS — I10 Essential (primary) hypertension: Secondary | ICD-10-CM | POA: Diagnosis not present

## 2023-12-04 DIAGNOSIS — R9431 Abnormal electrocardiogram [ECG] [EKG]: Secondary | ICD-10-CM | POA: Diagnosis not present

## 2023-12-04 DIAGNOSIS — E1165 Type 2 diabetes mellitus with hyperglycemia: Secondary | ICD-10-CM

## 2023-12-04 NOTE — Progress Notes (Signed)
 12/04/2023 Name: Matthew Fisher MRN: [id number] DOB: Nov 07, 1970  Chief Complaint  Patient presents with   Diabetes   Medication Management    Matthew Fisher is a 53 y.o. year old male. Today I spoke with patient and his wife, Matthew Fisher    They were referred to the pharmacist by their PCP for assistance in managing diabetes, hypertension, and medication access.    Subjective:  Medication Access/Adherence Current Pharmacy:  [facility] [address] [place], [place] - [address] [place] [address] Phone: [phone] Fax: [phone]  [facility] - [place], [place] - [address] [place] [address] Phone: [phone] Fax: [phone]  [facility] [place] [facility] [address] [place] [address] Phone: [phone] Fax: [phone]  [facility] [address] [place], [place] - [address] [place] [address] Phone: [phone] Fax: [phone]  Patient reports affordability concerns with their medications: Yes  - has been approved to get [place] and Januvia from medication assistance program thru 10/08/2023. We have applied for assistance for 2025 for both [place] and Januvia.  Patient reports access/transportation concerns to their pharmacy: No  Patient reports adherence concerns with their medications:  Yes       Diabetes: Current medications:   Levemir 8 to 12 units daily (per patient taking every day but varies amount he takes because he states he is not always able to see the numbers on the pen) Januvia 50mg  - take 0.5 tablet = 25mg  daily but patient has taking 50mg  daily Farxiga 10mg  daily   He has been using Continuous Glucose Monitor Libre sensor but last date I can see in [place] is from 11/28/2023. He reports that the app keep kicking him out.   Date of Report: 11/15/2023 to 11/28/2023  % Time CGM is  active: 73% Average Glucose: 207 mg/dL Glucose Management Indicator: 8.3%  Glucose Variability: 21.1% (goal <36%) Time in Goal:  - Time in range 70-180: 32% - Time above range: 68% - Time below range: 0% Observed patterns: Highest in middle of day but generally blood glucose is above goal most of day. No lows noted.     Hypertension: Current medications: lisinopril 40mg  daily,amlodipine 10mg  daily. carvedilol 25mg  twice a day and hydralazine 25mg  twice a day  Provided patient with home blood pressure cuff last month.  Patient has been checking blood pressure about twice a week.    BP Readings from Last 3 Encounters:  12/04/23 110/62  11/11/23 (!) 147/74  08/16/23 130/77     Medication Management: Current adherence strategy: wife has taken over giving medications. Using weekly pill container - adherence has improved  Patient reports today that he has been taking esomeprazole 20mg  twice a day and then he was told to also take omeprazole recently. I could not find any documentation in his chart regarding Prilosec or omeprazole.   Barriers to adherence:  Multiple comorbidities Complex medication regimen Poor knowledge of the illness and medication. Independent pausing, stopping or controlling of the medication. Lack of knowledge and confidence in self-management. Low understanding / confidence in medications benefits.   Recent fill dates:   [facility] Friendly - He has received medication assistance program delivery of Januiva for 90 DS.  Amlodipine - last refill was 09/11/2023 for 90 days. Carvedilol 25mg  - last refill was 06/07/2023 for 90 DS - per patient he  has plenty of this on hand Levemir - last refill was 01/27/2022 for 15 mL (per patient he has about 4 or 5 pens on hand)  Atorvastatin - last refill was 06/24/2023 - 90 days - per patient and his wife they still have this on hand at home.  Lisinopril - last refill was 10/18/2023 for 90 day supply Ezetimibe 10mg  - last refill  was 06/12/2023 - 90 days (per patient and wife he has this medication at home and refill not needed at this time)    Objective:   Lab Results  Component Value Date   HGBA1C 8.2 (H) 11/11/2023   BP Readings from Last 3 Encounters:  12/04/23 110/62  11/11/23 (!) 147/74  08/16/23 130/77     Lab Results  Component Value Date   CREATININE 3.19 (H) 11/11/2023   BUN 40 (H) 11/11/2023   NA 137 11/11/2023   K 4.7 11/11/2023   CL 104 11/11/2023   CO2 23 11/11/2023    Lab Results  Component Value Date   CHOL 172 12/10/2022   HDL 44.30 12/10/2022   LDLCALC 107 (H) 12/10/2022   TRIG 102.0 12/10/2022   CHOLHDL 4 12/10/2022    Medications Reviewed Today     Reviewed by Henrene Pastor, RPH-CPP (Pharmacist) on 12/04/23 at 1431  Med List Status: <None>   Medication Order Taking? Sig Documenting Provider Last Dose Status Informant  acetaminophen (TYLENOL) 325 MG tablet [id number]  Take by mouth. [provider]  Active   allopurinol (ZYLOPRIM) 300 MG tablet [id number] Yes Take 1 tablet by mouth once daily Gearldine Bienenstock, PA-C Taking Active   amLODipine (NORVASC) 10 MG tablet [id number] Yes Take 1 tablet by mouth once daily Bradd Canary, MD Taking Active   aspirin 81 MG chewable tablet [id number]  Chew 1 tablet (81 mg total) by mouth daily. Sondra Barges, PA-C  Active Self  atorvastatin (LIPITOR) 80 MG tablet [id number] Yes Take 1 tablet by mouth once daily Zola Button, Grayling Congress, DO Taking Active   blood glucose meter kit and supplies KIT [id number]  Dispense based on patient and insurance preference. Use up to four times daily as directed. Please include lancets, test strips, control solution. Clayborne Dana, NP  Active   Blood Pressure Monitoring (BLOOD PRESSURE CUFF) MISC [id number]  Use to check blood pressure daily Hyman Hopes B, NP  Active   carvedilol (COREG) 25 MG tablet [id number] Yes TAKE 1 TABLET BY MOUTH TWICE DAILY WITH A MEAL Bradd Canary, MD Taking Active    cholecalciferol 25 MCG (1000 UT) tablet [id number]  Take by mouth. [provider]  Active   Continuous Glucose Sensor (FREESTYLE LIBRE 3 PLUS SENSOR) MISC [id number] Yes Change sensor every 15 days, to check blood sugars continuously Clayborne Dana, NP Taking Active   dapagliflozin propanediol (FARXIGA) 10 MG TABS tablet [id number] Yes Take 1 tablet (10 mg total) by mouth daily. Clayborne Dana, NP Taking Active            Med Note [facility], Rozanne Heumann B   Wed Nov 06, 2023  1:33 PM) Approved to receive from [facility] and Me Program thru 10/07/2024  diclofenac Sodium (VOLTAREN) 1 % GEL [id number]  Apply topically. [provider]  Active   esomeprazole (NEXIUM) 20 MG capsule [id number] Yes Take 1 capsule (20 mg total) by mouth 2 (two) times daily before a meal. Clayborne Dana, NP Taking Active   ezetimibe (ZETIA) 10 MG tablet [id number] Yes Take 1 tablet (  10 mg total) by mouth daily. Bradd Canary, MD Taking Active   ferrous sulfate (FEROSUL) 325 (65 FE) MG tablet [id number] Yes Take 1 tablet (325 mg total) by mouth every morning. Clayborne Dana, NP Taking Active   furosemide (LASIX) 40 MG tablet [id number] Yes Take 1 tablet by mouth once daily Bradd Canary, MD Taking Active   hydrALAZINE (APRESOLINE) 25 MG tablet [id number] Yes Take 1 tablet (25 mg total) by mouth in the morning and at bedtime. Chilton Si, MD Taking Active   insulin detemir (LEVEMIR FLEXTOUCH) 100 UNIT/ML FlexPen [id number]  Inject 10 Units into the skin at bedtime. Start with 10 units every night. Titrate up by 2 units every 3 days if morning/fasting glucose is higher than 130 consistently.  Patient taking differently: Inject 12 Units into the skin at bedtime. Start with 10 units every night. Titrate up by 2 units every 3 days if morning/fasting glucose is higher than 130 consistently.   Clayborne Dana, NP  Active            Med Note Clydie Braun, Faye Strohman B   Mon Sep 02, 2023  2:08 PM)    Insulin Pen Needle (PEN NEEDLES) 32G X 4  MM MISC [id number] Yes 1 each by Does not apply route daily. Clayborne Dana, NP Taking Active   lisinopril (ZESTRIL) 40 MG tablet [id number] Yes Take 1 tablet (40 mg total) by mouth daily. Clayborne Dana, NP Taking Active   nitroGLYCERIN (NITROSTAT) 0.4 MG SL tablet [id number]  Place 1 tablet (0.4 mg total) under the tongue every 5 (five) minutes as needed for chest pain. Clayborne Dana, NP  Active            Med Note (CANTER, KAYLYN D   Tue Jun 12, 2022  9:25 AM) PRN  sitaGLIPtin (JANUVIA) 50 MG tablet [id number] Yes Take 0.5 tablets (25 mg total) by mouth daily.  Patient taking differently: Take 50 mg by mouth daily.   Bradd Canary, MD Taking Active            Med Note Clydie Braun, Glenna Durand   Wed Jun 05, 2023 11:53 AM) Getting from [employer] medication assistance program 04/2023 thru 09/2023  tamsulosin (FLOMAX) 0.4 MG CAPS capsule [id number] Yes TAKE 1 CAPSULE BY MOUTH ONCE DAILY AFTER SUPPER Clayborne Dana, NP Taking Active               Assessment/Plan:   Diabetes: not at goal of A1c < 7.0% .  - Decreased dose of Januvia to 25mg  daily (adjusted based on last eGFR) - updated Rx at [employer] [facility] medication assistance program  - Consider GLP1 in the future (Ozempic or Mounjaro - would replace Januvia)  - Recommended patient increase Levemir to 12 units a day - he will eventually transition to [name] once he uses current suppy of Levemir - Continue Farxiga 10mg  daily   - Recommended patient [employer] 3 app and reinstall - I think this will correct issues with the app. He will also reconnect to our office.   Hypertension: Today's blood pressure was at goal - goal blood pressure < 130/80  CKD + DM  - continue current blood pressure medications - lisinopril, amlodipine and hydralazine  Medication Management: Current strategy shows improved medication adherence  - Continue to use weekly pill box to organize medications. Wife will continue to assist with medication administration.  - reminded that  carvedilol, atorvastatin, ezetimibe, furosemide should be due soon. Per wife they have  these meds at home from past months when he was not taking as prescribed.  - Recommended patient hold omeprazole / Prilosec and only take esomeprazole / Nexium 20mg  twice a day. If he needs additional therapy for reflux he could add  famotidine / Pepcid 10mg  daily or 20mg  every OTHER day (since CrCl is <55mL/min) but will double check with PCP about Pepcid use.   Follow Up Plan:  4 to 8  weeks.   Henrene Pastor, PharmD Clinical Pharmacist [facility]

## 2023-12-04 NOTE — Progress Notes (Signed)
 BP Readings from Last 3 Encounters:  11/11/23 (!) 147/74  08/16/23 130/77  08/09/23 (!) 156/78    Pt here for Blood pressure check per Hyman Hopes  Pt currently takes: Lisinopril 40 mg, Amlodipine 10 mg, Carvedilol 25 mg    Pt reports compliance with medication.  BP today @ = 110/62 HR = 76  Pt advised per Hyman Hopes, continue current regimen as is. Follow up appointment was made. Will check blood work at this time.

## 2023-12-09 ENCOUNTER — Telehealth: Payer: Self-pay | Admitting: *Deleted

## 2023-12-09 NOTE — Telephone Encounter (Signed)
 Patient was identified as falling into the [place] Measure - Diabetes.   Patient was: Appointment scheduled with primary care provider in the next 30 days.

## 2023-12-14 ENCOUNTER — Other Ambulatory Visit: Payer: Self-pay | Admitting: Family Medicine

## 2023-12-16 ENCOUNTER — Ambulatory Visit (INDEPENDENT_AMBULATORY_CARE_PROVIDER_SITE_OTHER): Payer: Medicare Other | Admitting: Family Medicine

## 2023-12-16 ENCOUNTER — Encounter: Payer: Self-pay | Admitting: Family Medicine

## 2023-12-16 VITALS — BP 111/57 | HR 82 | Ht 68.0 in | Wt 224.0 lb

## 2023-12-16 DIAGNOSIS — K219 Gastro-esophageal reflux disease without esophagitis: Secondary | ICD-10-CM | POA: Diagnosis not present

## 2023-12-16 DIAGNOSIS — Z09 Encounter for follow-up examination after completed treatment for conditions other than malignant neoplasm: Secondary | ICD-10-CM

## 2023-12-16 NOTE — Progress Notes (Signed)
 Established Patient Office Visit  Subjective   Patient ID: Matthew Fisher, male    DOB: 1971/05/29  Age: 53 y.o. MRN: [id number]  Chief Complaint  Patient presents with   Medical Management of Chronic Issues    HPI   Recent hospitalization/ED visit Location: [facility] [place] [facility] Admission date: 12/03/23 Discharge date: 12/04/23 ED obs, not admitted  CC: chest pain, abdominal pain, nausea, vomiting, diarrhea after dinner Diagnosis: GI etiology suspected, gastritis vs ulcer Workup: EKG (no ischemic changes), labs unremarkable, CXR negative, CT abd/pelvis negative, Treatment: EMS gave ASA 324 mg and IV Zofran; ED gave GI cocktail (resolution of symptoms)    Discussed the use of AI scribe software for clinical note transcription with the patient, who gave verbal consent to proceed.  History of Present Illness The patient, with a history of heart attack, presents for follow-up after a recent emergency room visit for chest and abdominal pain.  On February 25th, he visited the emergency room for chest pain, abdominal pain, nausea, vomiting, and possibly diarrhea. The pain was primarily epigastric, sore, and tender. He received a GI cocktail and nausea medication, which he found unpleasant. Extensive workup including EKG, blood work, x-ray, and CT scan were all normal, and he was discharged home.  He has a history of a heart attack in 2019, during which he experienced severe vomiting. He recalls a similar episode about six years ago, shortly after his heart attack, which led to a visit to [facility] in [place]. At that time, it was thought to be related to gastritis or an ulcer, with symptoms of clear and foamy emesis.  Since the ER visit, he has had no recurrence of symptoms and recently returned from a trip to [place] for [place]ike Week, feeling great. He mentions his blood pressure is well-controlled and he is doing well on all his medications. He is currently taking esomeprazole for  acid reflux.   He mentions irregular bowel movements and occasionally uses fiber supplements like Metamucil or Benefiber, though not consistently. No other gastrointestinal symptoms since the ER visit.        ROS All review of systems negative except what is listed in the HPI    Objective:     BP (!) 111/57   Pulse 82   Ht 5\' 8"  (1.727 m)   Wt 224 lb (101.6 kg)   SpO2 99%   BMI 34.06 kg/m    Physical Exam Vitals reviewed.  Constitutional:      Appearance: Normal appearance. He is obese.  Cardiovascular:     Rate and Rhythm: Normal rate and regular rhythm.  Pulmonary:     Effort: Pulmonary effort is normal.  Abdominal:     General: There is no distension.     Palpations: There is no mass.     Tenderness: There is no abdominal tenderness. There is no guarding or rebound.  Skin:    General: Skin is warm and dry.  Neurological:     Mental Status: He is alert and oriented to person, place, and time.  Psychiatric:        Mood and Affect: Mood normal.        Behavior: Behavior normal.        Thought Content: Thought content normal.        Judgment: Judgment normal.      No results found for any visits on 12/16/23.    The ASCVD Risk score (Arnett DK, et al., 2019) failed to calculate for the following  reasons:   Risk score cannot be calculated because patient has a medical history suggesting prior/existing ASCVD    Assessment & Plan:   Problem List Items Addressed This Visit       Active Problems   GERD (gastroesophageal reflux disease) - Primary   Other Visit Diagnoses       Hospital discharge follow-up           Assessment & Plan Epigastric Pain Recent episode of epigastric pain with nausea, vomiting, and diarrhea. Workup in the ER including EKG, blood work, x-ray, and CT were all normal. Symptoms resolved and patient has been asymptomatic since. Likely gastritis or peptic ulcer disease. -Continue current regimen of esomeprazole. -Avoid high  fat, greasy, and high acid foods. -Do not lie down immediately after eating -Monitor for new/worsening symptoms and follow-up as needed.      Return if symptoms worsen or fail to improve.    Clayborne Dana, NP

## 2023-12-18 ENCOUNTER — Other Ambulatory Visit: Payer: Self-pay | Admitting: Neurology

## 2023-12-18 DIAGNOSIS — N184 Chronic kidney disease, stage 4 (severe): Secondary | ICD-10-CM | POA: Diagnosis not present

## 2023-12-18 DIAGNOSIS — M109 Gout, unspecified: Secondary | ICD-10-CM | POA: Diagnosis not present

## 2023-12-18 DIAGNOSIS — I129 Hypertensive chronic kidney disease with stage 1 through stage 4 chronic kidney disease, or unspecified chronic kidney disease: Secondary | ICD-10-CM | POA: Diagnosis not present

## 2023-12-18 DIAGNOSIS — E11649 Type 2 diabetes mellitus with hypoglycemia without coma: Secondary | ICD-10-CM | POA: Diagnosis not present

## 2023-12-18 MED ORDER — DAPAGLIFLOZIN PROPANEDIOL 10 MG PO TABS: 10.0000 mg | ORAL_TABLET | Freq: Every day | ORAL | 1 refills | Status: DC

## 2023-12-19 ENCOUNTER — Other Ambulatory Visit: Payer: Self-pay | Admitting: Family Medicine

## 2023-12-20 LAB — BASIC METABOLIC PANEL
BUN: 33 — AB (ref 4–21)
CO2: 20 (ref 13–22)
Chloride: 105 (ref 99–108)
Creatinine: 2.7 — AB (ref 0.6–1.3)
Glucose: 145
Potassium: 4.7 meq/L (ref 3.5–5.1)
Sodium: 138 (ref 137–147)

## 2023-12-20 LAB — COMPREHENSIVE METABOLIC PANEL
Albumin: 4.1 (ref 3.5–5.0)
Calcium: 8.4 — AB (ref 8.7–10.7)
eGFR: 27

## 2023-12-20 LAB — HEMOGLOBIN A1C
Hemoglobin A1C: 7.9
PTH: 123

## 2023-12-20 LAB — IRON,TIBC AND FERRITIN PANEL
%SAT: 27
Ferritin: 331
Iron: 74
TIBC: 274
UIBC: 200

## 2023-12-20 LAB — CBC AND DIFFERENTIAL: Hemoglobin: 10.4 — AB (ref 13.5–17.5)

## 2023-12-23 ENCOUNTER — Other Ambulatory Visit: Payer: Self-pay | Admitting: Family Medicine

## 2023-12-25 ENCOUNTER — Encounter: Payer: Self-pay | Admitting: Neurology

## 2023-12-25 LAB — CREATININE, URINE, RANDOM

## 2023-12-25 LAB — PHOSPHORUS: Phosphorus: 3.6

## 2023-12-25 LAB — PROTEIN,TOTAL,URINE: PROTEIN,TOTAL,URINE: 134.5

## 2023-12-25 LAB — PROTEIN / CREATININE RATIO, URINE: Protein Creatinine Ratio: 1133

## 2023-12-25 LAB — PTH, INTACT: PTH, Intact: 123

## 2023-12-31 ENCOUNTER — Encounter: Payer: Self-pay | Admitting: Nephrology

## 2024-01-02 DIAGNOSIS — H2512 Age-related nuclear cataract, left eye: Secondary | ICD-10-CM | POA: Diagnosis not present

## 2024-01-02 DIAGNOSIS — E113513 Type 2 diabetes mellitus with proliferative diabetic retinopathy with macular edema, bilateral: Secondary | ICD-10-CM | POA: Diagnosis not present

## 2024-01-02 DIAGNOSIS — H35372 Puckering of macula, left eye: Secondary | ICD-10-CM | POA: Diagnosis not present

## 2024-01-02 DIAGNOSIS — Z961 Presence of intraocular lens: Secondary | ICD-10-CM | POA: Diagnosis not present

## 2024-01-04 LAB — MICROALBUMIN / CREATININE URINE RATIO: Microalb Creat Ratio: 1541

## 2024-01-06 ENCOUNTER — Encounter (HOSPITAL_BASED_OUTPATIENT_CLINIC_OR_DEPARTMENT_OTHER): Payer: Self-pay

## 2024-01-07 ENCOUNTER — Ambulatory Visit (HOSPITAL_BASED_OUTPATIENT_CLINIC_OR_DEPARTMENT_OTHER): Payer: Medicare Other | Admitting: Cardiovascular Disease

## 2024-01-07 ENCOUNTER — Encounter (HOSPITAL_BASED_OUTPATIENT_CLINIC_OR_DEPARTMENT_OTHER): Payer: Self-pay | Admitting: Cardiovascular Disease

## 2024-01-07 VITALS — BP 135/69 | HR 83 | Ht 68.0 in | Wt 225.9 lb

## 2024-01-07 DIAGNOSIS — N182 Chronic kidney disease, stage 2 (mild): Secondary | ICD-10-CM

## 2024-01-07 DIAGNOSIS — E1165 Type 2 diabetes mellitus with hyperglycemia: Secondary | ICD-10-CM

## 2024-01-07 DIAGNOSIS — I214 Non-ST elevation (NSTEMI) myocardial infarction: Secondary | ICD-10-CM | POA: Diagnosis not present

## 2024-01-07 DIAGNOSIS — E782 Mixed hyperlipidemia: Secondary | ICD-10-CM

## 2024-01-07 DIAGNOSIS — Z720 Tobacco use: Secondary | ICD-10-CM

## 2024-01-07 DIAGNOSIS — I2510 Atherosclerotic heart disease of native coronary artery without angina pectoris: Secondary | ICD-10-CM

## 2024-01-07 DIAGNOSIS — I10 Essential (primary) hypertension: Secondary | ICD-10-CM

## 2024-01-07 DIAGNOSIS — R079 Chest pain, unspecified: Secondary | ICD-10-CM | POA: Diagnosis not present

## 2024-01-07 NOTE — Patient Instructions (Addendum)
 Medication Instructions:  Your physician recommends that you continue on your current medications as directed. Please refer to the Current Medication list given to you today.   Labwork: FASTING LIPIDS SOON   Testing/Procedures: Your physician has requested that you have a lexiscan myoview. For further information please visit [email or web address] Please follow instruction sheet, as given.  Follow-Up: 6 MONTHS WITH DR [place] OR Ronn Melena NP   If you need a refill on your cardiac medications before your next appointment, please call your pharmacy.  You have been referred to ENDOCRINOLOGY  IF YOU DO NOT HEAR FROM THEM IN 2 WEEKS YOU CAN CALL THEM DIRECTLY AT THE HIGHLIGHTED NUMBER

## 2024-01-07 NOTE — Progress Notes (Signed)
 [facility] Initial Assessment:    Date:  01/07/2024   ID:  Matthew Fisher, DOB [birth year], MRN [id number]  PCP:  Clayborne Dana, NP  Cardiologist:  Gypsy Balsam, MD   Referring MD: Clayborne Dana, NP   CC: Hypertension  History of Present Illness:    Matthew Fisher is a 53 y.o. male with a hx of hypertension, CAD with NSTEMI 10/2017 s/p multivessel PCI of OM1, PDA, PLA ostium, and mid PLA, GERD, CKD IIIb, diabetes, prior tobacco abuse, and gout, here to establish care in the [facility]. He was seen by Hyman Hopes, NP 12/10/2022 and his blood pressure was 143/78. His lisinopril was increased to 40 mg daily. He presented at his PCP's office 12/27/2022 for a blood pressure check. His blood pressure readings showed: 9:05 AM left arm 146/86, 9:07 AM right arm 158/86, 9:11 AM left arm 150/82, 9:13 AM right arm 148/86. He was compliant with amlodipine 10mg , coreg 25mg , lisinopril 40mg . He was referred to the [facility] and advised to obtain a home BP cuff.    At his visit 02/2023 his BP at home was running in the 140s/80s.  Hydralazine was added.  He was referred for a home sleep study that was not performed.    Discussed the use of AI scribe software for clinical note transcription with the patient, who gave verbal consent to proceed. History of Present Illness Matthew Fisher experiences shortness of breath when walking up stairs or inclines, which he attributes to his knee pain. He engages in some physical activity, such as weed eating, but not much cardio. No chest pain, palpitations, or dizziness during these episodes. He has a history of a heart attack in 2019 and has had a stress test and echocardiogram in April 2023, which were normal. He is currently on multiple medications for blood pressure management, including amlodipine, carvedilol, hydralazine, and lisinopril. His blood pressure readings at home have been low, and he is diligent about  taking his medications.  He had an ER visit due to an episode where he felt sick, had to have a bowel movement, and felt like he was going to pass out. During this episode, he experienced coughing and spitting up foamy white material. His blood pressure and blood sugar were checked and found to be normal, but an EKG showed some concern.  He had a similar issue many years ago that was attributable to GERD.  He has been managing his diabetes with Farxiga, insulin, and Januvia, and his A1c was recently 7.9, which is an improvement from a previous higher level. He also takes atorvastatin and Zetia for cholesterol management.  He reports experiencing chronic knee pain since 1991, which began after an unknown injury. He describes the pain as severe and states that it affects his ability to be active, contributing to difficulty in managing his weight and chronic conditions. He has a history of a complete tear of the medial and lateral meniscus, confirmed by MRI in 2023, and was advised to consider surgery, but his A1c was too high at the time. He has not seen an orthopedic specialist recently.  Previous antihypertensives:   Past Medical History:  Diagnosis Date   Arthritis    CAD (coronary artery disease)    a. NSTEMI 10/2017 s/p multivessel PCI of the OM1, PDA, PLA ostium and mid PLA.   CKD (chronic kidney disease), stage II    GERD (gastroesophageal reflux disease)    Gout    Hypertension  Insulin dependent diabetes mellitus     Past Surgical History:  Procedure Laterality Date   CORONARY STENT INTERVENTION N/A 10/17/2017   Procedure: CORONARY STENT INTERVENTION;  Surgeon: Corky Crafts, MD;  Location: [facility] INVASIVE CV LAB;  Service: Cardiovascular;  Laterality: N/A;   EYE SURGERY     EYE SURGERY Right 08/2022   laser surgery   FRACTURE SURGERY     right ring finger   KNEE ARTHROSCOPY WITH LATERAL MENISECTOMY Left 01/22/2013   Procedure: LEFT KNEE ARTHROSCOPY DEBRIDEMENT  CHONDROPLASTY, LATERAL RELEASE AND partial medial meniscectomy;  Surgeon: Javier Docker, MD;  Location: [facility];  Service: Orthopedics;  Laterality: Left;   KNEE SURGERY     "removed bone"   LEFT HEART CATH N/A 10/17/2017   Procedure: Left Heart Cath;  Surgeon: Corky Crafts, MD;  Location: [facility] INVASIVE CV LAB;  Service: Cardiovascular;  Laterality: N/A;   LEFT HEART CATH AND CORONARY ANGIOGRAPHY N/A 10/15/2017   Procedure: LEFT HEART CATH AND CORONARY ANGIOGRAPHY;  Surgeon: Marykay Lex, MD;  Location: [facility] INVASIVE CV LAB;  Service: Cardiovascular;  Laterality: N/A;   SHOULDER SURGERY Bilateral     Current Medications: Current Meds  Medication Sig   acetaminophen (TYLENOL) 325 MG tablet Take by mouth.   allopurinol (ZYLOPRIM) 300 MG tablet Take 1 tablet by mouth once daily   amLODipine (NORVASC) 10 MG tablet Take 1 tablet by mouth once daily   aspirin 81 MG chewable tablet Chew 1 tablet (81 mg total) by mouth daily.   atorvastatin (LIPITOR) 80 MG tablet Take 1 tablet by mouth once daily   blood glucose meter kit and supplies KIT Dispense based on patient and insurance preference. Use up to four times daily as directed. Please include lancets, test strips, control solution.   Blood Pressure Monitoring (BLOOD PRESSURE CUFF) MISC Use to check blood pressure daily   carvedilol (COREG) 25 MG tablet TAKE 1 TABLET BY MOUTH TWICE DAILY WITH A MEAL   cholecalciferol 25 MCG (1000 UT) tablet Take by mouth.   Continuous Glucose Sensor (FREESTYLE LIBRE 3 PLUS SENSOR) MISC Change sensor every 15 days, to check blood sugars continuously   dapagliflozin propanediol (FARXIGA) 10 MG TABS tablet Take 1 tablet (10 mg total) by mouth daily.   diclofenac Sodium (VOLTAREN) 1 % GEL Apply topically.   esomeprazole (NEXIUM) 20 MG capsule Take 1 capsule (20 mg total) by mouth 2 (two) times daily before a meal.   ezetimibe (ZETIA) 10 MG tablet Take 1 tablet (10 mg total) by mouth daily.   ferrous sulfate  (FEROSUL) 325 (65 FE) MG tablet Take 1 tablet (325 mg total) by mouth every morning.   furosemide (LASIX) 40 MG tablet Take 1 tablet by mouth once daily   insulin detemir (LEVEMIR FLEXTOUCH) 100 UNIT/ML FlexPen Inject 10 Units into the skin at bedtime. Start with 10 units every night. Titrate up by 2 units every 3 days if morning/fasting glucose is higher than 130 consistently. (Patient taking differently: Inject 12 Units into the skin at bedtime. Start with 10 units every night. Titrate up by 2 units every 3 days if morning/fasting glucose is higher than 130 consistently.)   Insulin Pen Needle (PEN NEEDLES) 32G X 4 MM MISC 1 each by Does not apply route daily.   lisinopril (ZESTRIL) 40 MG tablet Take 1 tablet (40 mg total) by mouth daily.   nitroGLYCERIN (NITROSTAT) 0.4 MG SL tablet Place 1 tablet (0.4 mg total) under the tongue every 5 (five) minutes  as needed for chest pain.   sitaGLIPtin (JANUVIA) 50 MG tablet Take 0.5 tablets (25 mg total) by mouth daily. (Patient taking differently: Take 50 mg by mouth daily.)   tamsulosin (FLOMAX) 0.4 MG CAPS capsule TAKE 1 CAPSULE BY MOUTH ONCE DAILY AFTER SUPPER    Allergies:   Patient has no known allergies.   Social History   Socioeconomic History   Marital status: Married    Spouse name: Not on file   Number of children: Not on file   Years of education: Not on file   Highest education level: Not on file  Occupational History   Not on file  Tobacco Use   Smoking status: Former    Current packs/day: 0.00    Average packs/day: 0.5 packs/day for 20.0 years (10.0 ttl pk-yrs)    Types: Cigarettes, Cigars    Start date: 10/08/1982    Quit date: 10/08/2002    Years since quitting: 21.2    Passive exposure: Never   Smokeless tobacco: Never  Vaping Use   Vaping status: Every Day   Substances: Nicotine, Flavoring  Substance and Sexual Activity   Alcohol use: Yes    Comment: occasional   Drug use: No   Sexual activity: Yes  Other Topics Concern    Not on file  Social History Narrative   Not on file   Social Drivers of Health   Financial Resource Strain: Medium Risk (12/28/2022)   Overall Financial Resource Strain (CARDIA)    Difficulty of Paying Living Expenses: Somewhat hard  Food Insecurity: No Food Insecurity (04/04/2023)   Hunger Vital Sign    Worried About Running Out of Food in the Last Year: Never true    Ran Out of Food in the Last Year: Never true  Transportation Needs: No Transportation Needs (04/04/2023)   PRAPARE - Administrator, Civil Service (Medical): No    Lack of Transportation (Non-Medical): No  Physical Activity: Sufficiently Active (04/04/2023)   Exercise Vital Sign    Days of Exercise per Week: 7 days    Minutes of Exercise per Session: 120 min  Stress: No Stress Concern Present (04/02/2022)   [employer] of Occupational Health - Occupational Stress Questionnaire    Feeling of Stress : Not at all  Social Connections: Moderately Integrated (04/02/2022)   Social Connection and Isolation Panel [NHANES]    Frequency of Communication with Friends and Family: More than three times a week    Frequency of Social Gatherings with Friends and Family: More than three times a week    Attends Religious Services: Never    Database administrator or Organizations: Yes    Attends Engineer, structural: More than 4 times per year    Marital Status: Married  Recent Concern: Social Connections - Moderately Isolated (04/02/2022)   Social Connection and Isolation Panel [NHANES]    Frequency of Communication with Friends and Family: More than three times a week    Frequency of Social Gatherings with Friends and Family: More than three times a week    Attends Religious Services: Never    Database administrator or Organizations: No    Attends Engineer, structural: Never    Marital Status: Married     Family History: The patient's family history includes Cancer in his father; Diabetes in his  mother; Healthy in his daughter, daughter, and son.  ROS:   Please see the history of present illness.    (+) Shortness of  breath (+) LE swelling (+) Snoring (+) Daytime somnolence All other systems reviewed and are negative.  EKGs/Labs/Other Studies Reviewed:    Echo  01/22/2022:  1. Left ventricular ejection fraction, by estimation, is 55 to 60%. The  left ventricle has normal function. The left ventricle has no regional  wall motion abnormalities. There is mild concentric left ventricular  hypertrophy. Left ventricular diastolic  parameters are consistent with Grade I diastolic dysfunction (impaired  relaxation).   2. Right ventricular systolic function is normal. The right ventricular  size is normal. There is normal pulmonary artery systolic pressure.   3. Left atrial size was moderately dilated.   4. The mitral valve is normal in structure. Trivial mitral valve  regurgitation. No evidence of mitral stenosis.   5. The aortic valve is tricuspid. Aortic valve regurgitation is not  visualized. No aortic stenosis is present.   6. The inferior vena cava is normal in size with greater than 50%  respiratory variability, suggesting right atrial pressure of 3 mmHg.   Exercise Stress Myoview  01/16/2022:   The study is normal. The study is low risk.   No ST deviation was noted.   LV perfusion is normal.   Left ventricular function is normal. Nuclear stress EF: 57 %. The left ventricular ejection fraction is normal (55-65%). End diastolic cavity size is normal.   Prior study not available for comparison. Low risk stress nuclear study with normal perfusion and normal left ventricular regional and global systolic function.  Coronary Stent Intervention/Left Heart Cath  10/17/2017: Dist LAD lesion is 100% stenosed. Mid LAD lesion is 70% stenosed. Ost 1st Mrg lesion is 80% stenosed. A drug-eluting stent was successfully placed using a STENT SYNERGY DES 2.75X12. Post intervention, there is  a 0% residual stenosis. Ost RPDA lesion is 75% stenosed. A drug-eluting stent was successfully placed using a STENT SYNERGY DES 3X16. Post intervention, there is a 0% residual stenosis. Post Atrio-2 lesion is 70% stenosed. Post Atrio-3 lesion is 95% stenosed. A drug-eluting stent was successfully placed using a STENT SYNERGY DES 2.5X12. A second stent, 2.25 x 12 Synergy was placed overlapping. Post intervention, there is a 0% residual stenosis. Kissing balloon angioplasty was done at the ostium of the PDA and the PLA. The ostium of the posterolateral artery was treated Using a BALLOON SAPPHIRE 2.5X12. Post intervention, there is a 40% residual stenosis at the ostium of the PLA. LV end diastolic pressure is normal. There is no aortic valve stenosis. A drug-eluting stent was successfully placed.   Successful PCI of the OM1, PDA, PLA ostium and mid PLA.     Continue dual antiplatelet therapy for at least one year along with aggressive secondary prevention.     Would consider indefinite clopidogrel given the extent of his disease.   Diagnostic Dominance: Right  Intervention     Left Heart Cath  10/15/2017: Distal RCA bifurcation lesionS: Ost RPDA lesion is 50% stenosed. Post Atrio-1 lesion is 70% stenosed. Post Atrio-2 lesion is 95% stenosed. Ost 1st Mrg lesion is 80% stenosed. Major bifurcating OM branch. Prox Cx to Mid Cx lesion is 50% stenosed. Mid Cx to Dist Cx lesion is 90% stenosed. Prox LAD to Mid LAD lesion is 60% stenosed. Mid LAD lesion is 70% stenosed. Dist LAD lesion is 100% stenosed. Ost 1st Diag to 1st Diag lesion is 90% stenosed. Small moderate caliber major diagonal branch. There is hyperdynamic left ventricular systolic function. The left ventricular ejection fraction is greater than 65% by visual estimate.  LV end diastolic pressure is normal. There is no mitral valve regurgitation.   Severe multivessel disease, possibly diabetic in nature.   The LAD itself is  probably the least diseased vessel, however distally it is diffusely diseased and occludes. The major OM1 has a focal 70-80% stenosis, and the AV groove circumflex terminates with severe disease in multiple small bifurcations. There is bifurcation disease at the distal RCA into the RPDA and the RPAV with a more severe lesion in the proximal portion of the PADV that is a likely culprit lesion.   Given the extent of his CAD, the patient needs to be considered for bypass surgery is an option with diabetes and multivessel disease.  Unfortunately, targets not great with exception of the PDA, PL and OM1.  Diagonal and LAD are decent graft targets.   Plan for now will be to admit the patient to the stepdown/ICU unit for maintaining stability with IV nitroglycerin and heparin.  We will discuss with interventional colleagues and likely consult CT surgery morning. Restart IV nitroglycerin for blood pressure control.  Add carvedilol to his home dose of ACE inhibitor. High-dose statin and aspirin.   If he does go for bypass surgery, he would need to be on Plavix or Brilinta on discharge given this year extent of his disease.   EKG:  EKG is personally reviewed. 02/27/2023: Sinus rhythm. Rate 72 bpm. Nonspecific T wave abnormalities.  Recent Labs: 02/27/2023: TSH CANCELED 11/11/2023: ALT 14 12/20/2023: BUN 33; Creatinine 2.7; Hemoglobin 10.4; Potassium 4.7; Sodium 138   Recent Lipid Panel    Component Value Date/Time   CHOL 172 12/10/2022 0935   CHOL 101 02/21/2022 0917   TRIG 102.0 12/10/2022 0935   HDL 44.30 12/10/2022 0935   HDL 30 (L) 02/21/2022 0917   CHOLHDL 4 12/10/2022 0935   VLDL 20.4 12/10/2022 0935   LDLCALC 107 (H) 12/10/2022 0935   LDLCALC 41 02/21/2022 0917    Physical Exam:    VS:  BP 135/69 (BP Location: Left Arm, Patient Position: Sitting, Cuff Size: Normal)   Pulse 83   Ht 5\' 8"  (1.727 m)   Wt 225 lb 14.4 oz (102.5 kg)   SpO2 100%   BMI 34.35 kg/m  , BMI Body mass index is  34.35 kg/m. GENERAL:  Well appearing HEENT: Pupils equal round and reactive, fundi not visualized, oral mucosa unremarkable NECK:  No jugular venous distention, waveform within normal limits, carotid upstroke brisk and symmetric, no bruits, no thyromegaly LUNGS:  Clear to auscultation bilaterally HEART:  RRR.  PMI not displaced or sustained,S1 and S2 within normal limits, no S3, no S4, no clicks, no rubs, no murmurs ABD:  Flat, positive bowel sounds normal in frequency in pitch, no bruits, no rebound, no guarding, no midline pulsatile mass, no hepatomegaly, no splenomegaly EXT:  2 plus pulses throughout, no edema, no cyanosis, no clubbing SKIN:  No rashes, no nodules NEURO:  Cranial nerves II through XII grossly intact, motor grossly intact throughout PSYCH:  Cognitively intact, oriented to person place and time   ASSESSMENT/PLAN:    Assessment and Plan Assessment & Plan # Resistant Hypertension Hypertension managed with amlodipine, carvedilol, hydralazine, and lisinopril. Recent blood pressure readings indicate good control. - Continue current antihypertensive regimen.  Continue amlodipine, carvedilol, hydralazine and lisinopril  # Cardiac Evaluation Post-Syncope Recent syncope episode associated with coughing, suggestive of cough syncope. Previous cardiac evaluations, including stress test and echocardiogram in April 2023, were normal. History of myocardial infarction in 2019. Recent ER visit  raised concerns, but no significant findings. Nuclear stress test planned for further cardiac evaluation. - Schedule nuclear stress test to assess cardiac function.  # Knee Pain due to Meniscal Tear Chronic knee pain since 1991 due to complete tears of the medial and lateral meniscus, limiting physical activity and impacting diabetes and hypertension management. Previous surgical intervention discussions were deferred due to high A1c levels. Current A1c of 7.9 permits surgical consideration. -  Contact orthopedic specialist for follow-up regarding knee pain and potential surgical intervention.  # Type 2 Diabetes Mellitus Type 2 diabetes with improved A1c of 7.9, managed with Farxiga, Januvia, and insulin. Current A1c level permits surgical intervention if needed. Diabetes management is crucial to protect renal function due to hyperglycemia-induced stress. Referral to endocrinology suggested for optimized management. - Refer to endocrinologist for diabetes management.  # Chronic Kidney Disease 3b Chronic kidney disease managed with medications beneficial for renal function and diabetes. Control of hypertension and hyperglycemia is essential to prevent further renal damage. - Continue current medication regimen including Farxiga and lisinopril. - Continue f/u with Nephrology  # Hyperlipidemia Cholesterol levels not at target for a diabetic patient, increasing cardiovascular risk. Managed with atorvastatin and Zetia. Fasting lipid panel needed to assess current levels and adjust treatment. - Order fasting lipid panel at [facility] or the clinic's third floor lab.  # General Health Maintenance Routine health maintenance includes monitoring cholesterol levels and diabetes management to reduce cardiovascular risk. - Ensure regular follow-up for diabetes and cholesterol management.     Research: Politely declined RPM and AA Heart Studies  Screening for Secondary Hypertension:     02/27/2023    9:25 AM  Causes  Drugs/Herbals Screened     - Comments tries to limit salt, one coffee daily, occasional EtOH, prior tobacco.  +vapes  Sleep Apnea Screened     - Comments snores.  + daytime somnolence  Thyroid Disease Screened  Hyperaldosteronism Screened     - Comments check renin and aledosterone  Pheochromocytoma N/A  Cushing's Syndrome N/A  Hyperparathyroidism N/A  Coarctation of the Aorta Screened  Compliance Screened    Relevant Labs/Studies:    Latest Ref Rng & Units 12/20/2023    12:00 AM 11/11/2023    3:41 PM 08/09/2023   11:20 AM  Basic Labs  Sodium 137 - 147 138     137  137   Potassium 3.5 - 5.1 mEq/L 4.7     4.7  4.3   Creatinine 0.6 - 1.3 2.7     3.19  3.03      This result is from an external source.       Latest Ref Rng & Units 02/27/2023   10:41 AM 02/27/2023   10:14 AM  Thyroid   TSH uIU/mL CANCELED  1.220      Disposition:    FU with APP/PharmD in 1 month for the next 3 months.   FU with Nichael Ehly C. Duke Salvia, MD, [facility] in 4 months.  Medication Adjustments/Labs and Tests Ordered: Current medicines are reviewed at length with the patient today.  Concerns regarding medicines are outlined above.   Orders Placed This Encounter  Procedures   Lipid panel   Ambulatory referral to Endocrinology   Cardiac Stress Test: Informed Consent Details: Physician/Practitioner Attestation; Transcribe to consent form and obtain patient signature   MYOCARDIAL PERFUSION IMAGING   No orders of the defined types were placed in this encounter.    Signed, Chilton Si, MD  01/07/2024 8:27 PM    [facility]

## 2024-01-09 ENCOUNTER — Encounter (HOSPITAL_COMMUNITY): Payer: Self-pay

## 2024-01-15 ENCOUNTER — Encounter: Payer: Self-pay | Admitting: Neurology

## 2024-01-16 ENCOUNTER — Ambulatory Visit (HOSPITAL_COMMUNITY): Attending: Cardiology

## 2024-01-16 DIAGNOSIS — R079 Chest pain, unspecified: Secondary | ICD-10-CM | POA: Diagnosis not present

## 2024-01-16 LAB — MYOCARDIAL PERFUSION IMAGING
LV dias vol: 117 mL (ref 62–150)
LV sys vol: 53 mL
Nuc Stress EF: 55 %
Peak HR: 84 {beats}/min
Rest HR: 71 {beats}/min
Rest Nuclear Isotope Dose: 10.4 mCi
SDS: 0
SRS: 0
SSS: 0
ST Depression (mm): 0 mm
Stress Nuclear Isotope Dose: 32.5 mCi
TID: 1.08

## 2024-01-16 MED ORDER — TECHNETIUM TC 99M TETROFOSMIN IV KIT
10.4000 | PACK | Freq: Once | INTRAVENOUS | Status: AC | PRN
  Administered 2024-01-16: 10.4 via INTRAVENOUS

## 2024-01-16 MED ORDER — REGADENOSON 0.4 MG/5ML IV SOLN
0.4000 mg | Freq: Once | INTRAVENOUS | Status: AC
  Administered 2024-01-16: 0.4 mg via INTRAVENOUS

## 2024-01-16 MED ORDER — TECHNETIUM TC 99M TETROFOSMIN IV KIT
32.5000 | PACK | Freq: Once | INTRAVENOUS | Status: AC | PRN
  Administered 2024-01-16: 32.5 via INTRAVENOUS

## 2024-01-22 ENCOUNTER — Other Ambulatory Visit: Payer: Self-pay | Admitting: Family Medicine

## 2024-02-10 ENCOUNTER — Encounter: Payer: Self-pay | Admitting: Family Medicine

## 2024-02-10 ENCOUNTER — Ambulatory Visit (INDEPENDENT_AMBULATORY_CARE_PROVIDER_SITE_OTHER): Payer: Medicare Other | Admitting: Family Medicine

## 2024-02-10 VITALS — BP 131/70 | HR 80 | Ht 68.0 in | Wt 222.0 lb

## 2024-02-10 DIAGNOSIS — I509 Heart failure, unspecified: Secondary | ICD-10-CM | POA: Diagnosis not present

## 2024-02-10 DIAGNOSIS — E782 Mixed hyperlipidemia: Secondary | ICD-10-CM | POA: Diagnosis not present

## 2024-02-10 DIAGNOSIS — N182 Chronic kidney disease, stage 2 (mild): Secondary | ICD-10-CM | POA: Diagnosis not present

## 2024-02-10 DIAGNOSIS — E1343 Other specified diabetes mellitus with diabetic autonomic (poly)neuropathy: Secondary | ICD-10-CM

## 2024-02-10 DIAGNOSIS — E1165 Type 2 diabetes mellitus with hyperglycemia: Secondary | ICD-10-CM | POA: Diagnosis not present

## 2024-02-10 DIAGNOSIS — I10 Essential (primary) hypertension: Secondary | ICD-10-CM

## 2024-02-10 DIAGNOSIS — N184 Chronic kidney disease, stage 4 (severe): Secondary | ICD-10-CM | POA: Diagnosis not present

## 2024-02-10 NOTE — Assessment & Plan Note (Signed)
-  Reviewed most recent lipid panel -Medication management: restart the Lipitor  80 mg daily, Zetia  10 mg daily -Diet low in saturated fat -Regular exercise - at least 30 minutes, 5 times per week

## 2024-02-10 NOTE — Progress Notes (Signed)
 Established Patient Office Visit  Subjective   Patient ID: Matthew Fisher, male    DOB: Jul 29, 1971  Age: 53 y.o. MRN: [id number]  Chief Complaint  Patient presents with   Medical Management of Chronic Issues   Diabetes    HPI    Discussed the use of AI scribe software for clinical note transcription with the patient, who gave verbal consent to proceed.  History of Present Illness Matthew Fisher is a 53 year old male with diabetes and hypertension who presents for a regular follow-up visit.  His last A1c check in March was 7.9, showing some improvement. He is currently using insulin , having reduced his dose from 14 to 10 units due to episodes of hypoglycemia. He monitors his blood glucose with a Libre device but experiences issues with the app. He also uses a backup kit for finger pricking. His blood glucose levels have been fluctuating.  He experienced a gout flare-up in his right knee about 9-10 days ago, which he attributes to eating shrimp. The knee remains stiff but is improving. No other recent flares.  He mentions a past near-syncope episode, which he associates with acid reflux. This occurred while eating, followed by a sudden pain and pressure, leading to a fear of a heart attack. He has undergone a stress test recently.  He experiences back pain during activities like cutting grass or weed eating, which resolves after sitting.     Diabetes: - Checking glucose at home: [name] - Medications: Levemir  10 units daily, Farxiga  10 mg daily, Januvia  50 mg daily - Compliance: good - Diet: trying for low carb, heart healthy - Exercise: walking, active lifestyle - Eye exam: UTD - Foot exam:  - Microalbumin: UTD - Denies symptoms of hypoglycemia, polyuria, polydipsia, numbness extremities, foot ulcers/trauma, wounds that are not healing, medication side effects  Lab Results  Component Value Date   HGBA1C 7.9 12/20/2023    Hypertension, Hx NSTEMI, CHF, HLD: Following  with [facility], Dr Theodis Fiscal. - Medications: amlodipine  10 mg daily, atorvastatin  80 mg daily, carvedilol  25 mg BID, ezetimibe  10 mg daily, furosemide  40 mg daily, hydralazine  25 mg BID, lisinopril  40 mg daily - Compliance: good - Checking BP at home: no - Denies any SOB, recurrent headaches, CP, vision changes, LE edema, dizziness, palpitations, or medication side effects. - Diet: low carb, heart healthy  - Exercise: walking - Recently had a near syncopal episode with chest discomfort which he believes was indigestion - stress test was done 01/16/24: normal, low risk, no ST deviation, LV perfusion normal, no evidence of ischemia/infarct, EF 55%   CKD: - Following with [facility]        ROS All review of systems negative except what is listed in the HPI    Objective:     BP 131/70   Pulse 80   Ht 5\' 8"  (1.727 m)   Wt 222 lb (100.7 kg)   SpO2 100%   BMI 33.75 kg/m    Physical Exam Vitals reviewed.  Constitutional:      Appearance: Normal appearance. He is obese.  Cardiovascular:     Rate and Rhythm: Normal rate and regular rhythm.  Pulmonary:     Effort: Pulmonary effort is normal.  Abdominal:     General: There is no distension.     Palpations: There is no mass.     Tenderness: There is no abdominal tenderness. There is no guarding or rebound.  Skin:    General: Skin is warm and dry.  Neurological:     Mental Status: He is alert and oriented to person, place, and time.  Psychiatric:        Mood and Affect: Mood normal.        Behavior: Behavior normal.        Thought Content: Thought content normal.        Judgment: Judgment normal.      No results found for any visits on 02/10/24.    The ASCVD Risk score (Arnett DK, et al., 2019) failed to calculate for the following reasons:   Risk score cannot be calculated because patient has a medical history suggesting prior/existing ASCVD    Assessment & Plan:   Problem List Items Addressed This  Visit       Active Problems   Type 2 diabetes mellitus with hyperglycemia, without long-term current use of insulin  (HCC) - Primary (Chronic)   Lab Results  Component Value Date   HGBA1C 7.8 (H) 12/10/2022  -Patient reports blood sugars are improving. Following with our pharmacist for med management.  -Too early to recheck labs today  -Regular follow-up visit in three months to monitor diabetes control.  -Continue Levemir , Farxiga , and Januvia       Hypertension (Chronic)   Blood pressure is at goal for age and co-morbidities.   Recommendations: continue current meds - BP goal <130/80 - monitor and log blood pressures at home - check around the same time each day in a relaxed setting - Limit salt to <2000 mg/day - Follow DASH eating plan (heart healthy diet) - limit alcohol to 2 standard drinks per day for men and 1 per day for women - avoid tobacco products - get at least 2 hours of regular aerobic exercise weekly Patient aware of signs/symptoms requiring further/urgent evaluation. Labs recently stable. Recheck at next follow-up.      Gastroparesis due to secondary diabetes (HCC) (Chronic)   No symptoms currently.       CHF (congestive heart failure) (HCC) (Chronic)   No signs of fluid overload today. Continue current meds.      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 No new symptoms. Avoid nephrotoxic drugs.               Return in about 2 months (around 04/11/2024) for routine follow-up, A1c.    [name], NP

## 2024-02-10 NOTE — Assessment & Plan Note (Signed)
 Following with [facility]. No new symptoms. Avoid nephrotoxic drugs.

## 2024-02-10 NOTE — Assessment & Plan Note (Signed)
 Blood pressure is at goal for age and co-morbidities.   Recommendations: continue current meds - BP goal <130/80 - monitor and log blood pressures at home - check around the same time each day in a relaxed setting - Limit salt to <2000 mg/day - Follow DASH eating plan (heart healthy diet) - limit alcohol to 2 standard drinks per day for men and 1 per day for women - avoid tobacco products - get at least 2 hours of regular aerobic exercise weekly Patient aware of signs/symptoms requiring further/urgent evaluation. Labs recently stable. Recheck at next follow-up.

## 2024-02-10 NOTE — Assessment & Plan Note (Signed)
No symptoms currently.

## 2024-02-10 NOTE — Assessment & Plan Note (Signed)
 Lab Results  Component Value Date   HGBA1C 7.8 (H) 12/10/2022  -Patient reports blood sugars are improving. Following with our pharmacist for med management.  -Too early to recheck labs today  -Regular follow-up visit in three months to monitor diabetes control.  -Continue Levemir , Farxiga , and Januvia 

## 2024-02-10 NOTE — Assessment & Plan Note (Signed)
 No signs of fluid overload today. Continue current meds.

## 2024-02-11 LAB — LIPID PANEL
Chol/HDL Ratio: 3.1 ratio (ref 0.0–5.0)
Cholesterol, Total: 98 mg/dL — ABNORMAL LOW (ref 100–199)
HDL: 32 mg/dL — ABNORMAL LOW (ref >39)
LDL Chol Calc (NIH): 45 mg/dL (ref 0–99)
Triglycerides: 115 mg/dL (ref 0–149)
VLDL Cholesterol Cal: 21 mg/dL (ref 5–40)

## 2024-02-12 ENCOUNTER — Encounter (HOSPITAL_BASED_OUTPATIENT_CLINIC_OR_DEPARTMENT_OTHER): Payer: Self-pay | Admitting: Cardiovascular Disease

## 2024-02-13 ENCOUNTER — Telehealth: Payer: Self-pay | Admitting: Family Medicine

## 2024-02-13 NOTE — Telephone Encounter (Signed)
 Copied from CRM [id number]. Topic: Medicare AWV >> Feb 13, 2024 10:27 AM Juliana Ocean wrote: Reason for CRM: LVM 02/13/2024 to schedule AWV. Please schedule Virtual or Telehealth visits ONLY.   Rosalee Collins; Care Guide Ambulatory Clinical Support [place] [facility] Direct Dial: [phone]

## 2024-02-14 ENCOUNTER — Other Ambulatory Visit (HOSPITAL_BASED_OUTPATIENT_CLINIC_OR_DEPARTMENT_OTHER): Payer: Self-pay

## 2024-02-14 DIAGNOSIS — R142 Eructation: Secondary | ICD-10-CM | POA: Diagnosis not present

## 2024-02-14 DIAGNOSIS — Z860100 Personal history of colon polyps, unspecified: Secondary | ICD-10-CM | POA: Diagnosis not present

## 2024-02-14 DIAGNOSIS — K219 Gastro-esophageal reflux disease without esophagitis: Secondary | ICD-10-CM | POA: Diagnosis not present

## 2024-02-14 DIAGNOSIS — K5909 Other constipation: Secondary | ICD-10-CM | POA: Diagnosis not present

## 2024-02-15 ENCOUNTER — Encounter (HOSPITAL_BASED_OUTPATIENT_CLINIC_OR_DEPARTMENT_OTHER): Payer: Self-pay | Admitting: Cardiovascular Disease

## 2024-02-18 DIAGNOSIS — N184 Chronic kidney disease, stage 4 (severe): Secondary | ICD-10-CM | POA: Diagnosis not present

## 2024-02-18 DIAGNOSIS — M109 Gout, unspecified: Secondary | ICD-10-CM | POA: Diagnosis not present

## 2024-02-18 DIAGNOSIS — I129 Hypertensive chronic kidney disease with stage 1 through stage 4 chronic kidney disease, or unspecified chronic kidney disease: Secondary | ICD-10-CM | POA: Diagnosis not present

## 2024-02-18 DIAGNOSIS — E11649 Type 2 diabetes mellitus with hypoglycemia without coma: Secondary | ICD-10-CM | POA: Diagnosis not present

## 2024-02-20 LAB — IRON,TIBC AND FERRITIN PANEL
%SAT: 34
Ferritin: 310
Iron: 93
TIBC: 273
UIBC: 180

## 2024-02-20 LAB — PROTEIN / CREATININE RATIO, URINE: Creatinine, Urine: 41.8

## 2024-02-20 LAB — COMPREHENSIVE METABOLIC PANEL WITH GFR
Albumin: 4.1 (ref 3.5–5.0)
Calcium: 8.4 — AB (ref 8.7–10.7)
eGFR: 23

## 2024-02-20 LAB — BASIC METABOLIC PANEL WITH GFR
BUN: 38 — AB (ref 4–21)
CO2: 20 (ref 13–22)
Chloride: 103 (ref 99–108)
Creatinine: 3.1 — AB (ref 0.6–1.3)
Glucose: 200
Potassium: 5.2 meq/L — AB (ref 3.5–5.1)
Sodium: 138 (ref 137–147)

## 2024-02-20 LAB — CBC AND DIFFERENTIAL
Hemoglobin: 10.4 — AB (ref 13.5–17.5)
PTH: 109

## 2024-02-25 ENCOUNTER — Other Ambulatory Visit: Payer: Self-pay | Admitting: Family Medicine

## 2024-02-27 ENCOUNTER — Encounter: Payer: Self-pay | Admitting: Neurology

## 2024-02-27 LAB — PTH, INTACT: PTH, Intact: 109

## 2024-02-27 LAB — URIC ACID: Uric Acid: 6.8

## 2024-03-17 ENCOUNTER — Ambulatory Visit

## 2024-03-18 DIAGNOSIS — K219 Gastro-esophageal reflux disease without esophagitis: Secondary | ICD-10-CM | POA: Diagnosis not present

## 2024-03-18 DIAGNOSIS — K2100 Gastro-esophageal reflux disease with esophagitis, without bleeding: Secondary | ICD-10-CM | POA: Diagnosis not present

## 2024-03-18 DIAGNOSIS — K208 Other esophagitis without bleeding: Secondary | ICD-10-CM | POA: Diagnosis not present

## 2024-03-18 DIAGNOSIS — R140 Abdominal distension (gaseous): Secondary | ICD-10-CM | POA: Diagnosis not present

## 2024-03-23 ENCOUNTER — Other Ambulatory Visit: Payer: Self-pay | Admitting: Family Medicine

## 2024-03-23 DIAGNOSIS — E782 Mixed hyperlipidemia: Secondary | ICD-10-CM

## 2024-04-02 ENCOUNTER — Other Ambulatory Visit (HOSPITAL_BASED_OUTPATIENT_CLINIC_OR_DEPARTMENT_OTHER): Payer: Self-pay | Admitting: Cardiovascular Disease

## 2024-04-02 DIAGNOSIS — E113513 Type 2 diabetes mellitus with proliferative diabetic retinopathy with macular edema, bilateral: Secondary | ICD-10-CM | POA: Diagnosis not present

## 2024-04-02 DIAGNOSIS — Z961 Presence of intraocular lens: Secondary | ICD-10-CM | POA: Diagnosis not present

## 2024-04-02 DIAGNOSIS — H2512 Age-related nuclear cataract, left eye: Secondary | ICD-10-CM | POA: Diagnosis not present

## 2024-04-02 DIAGNOSIS — H35372 Puckering of macula, left eye: Secondary | ICD-10-CM | POA: Diagnosis not present

## 2024-04-02 LAB — HM DIABETES EYE EXAM

## 2024-04-03 ENCOUNTER — Telehealth: Payer: Self-pay | Admitting: Neurology

## 2024-04-03 NOTE — Telephone Encounter (Signed)
 Copied from CRM [id number]. Topic: Clinical - Lab/Test Results >> Apr 03, 2024  9:02 AM Carlatta H wrote: Reason for CRM: Calling about abnormal lab results//Please call back Rosaline at [phone]//    Called back, House calls  [facility] test - abnormal, mailed. Wanted to confirm we received results from March. Confirmed received.

## 2024-04-08 ENCOUNTER — Other Ambulatory Visit: Payer: Self-pay | Admitting: Family Medicine

## 2024-04-08 DIAGNOSIS — E611 Iron deficiency: Secondary | ICD-10-CM

## 2024-04-13 ENCOUNTER — Encounter: Payer: Self-pay | Admitting: Family Medicine

## 2024-04-13 ENCOUNTER — Ambulatory Visit (INDEPENDENT_AMBULATORY_CARE_PROVIDER_SITE_OTHER): Admitting: Family Medicine

## 2024-04-13 VITALS — BP 134/74 | HR 85 | Ht 68.0 in | Wt 224.0 lb

## 2024-04-13 DIAGNOSIS — Z7984 Long term (current) use of oral hypoglycemic drugs: Secondary | ICD-10-CM

## 2024-04-13 DIAGNOSIS — E782 Mixed hyperlipidemia: Secondary | ICD-10-CM | POA: Diagnosis not present

## 2024-04-13 DIAGNOSIS — Z1159 Encounter for screening for other viral diseases: Secondary | ICD-10-CM | POA: Diagnosis not present

## 2024-04-13 DIAGNOSIS — N184 Chronic kidney disease, stage 4 (severe): Secondary | ICD-10-CM | POA: Diagnosis not present

## 2024-04-13 DIAGNOSIS — I509 Heart failure, unspecified: Secondary | ICD-10-CM | POA: Diagnosis not present

## 2024-04-13 DIAGNOSIS — E1165 Type 2 diabetes mellitus with hyperglycemia: Secondary | ICD-10-CM

## 2024-04-13 DIAGNOSIS — I10 Essential (primary) hypertension: Secondary | ICD-10-CM | POA: Diagnosis not present

## 2024-04-13 LAB — BASIC METABOLIC PANEL WITH GFR
BUN: 25 mg/dL — ABNORMAL HIGH (ref 6–23)
CO2: 25 meq/L (ref 19–32)
Calcium: 8.6 mg/dL (ref 8.4–10.5)
Chloride: 104 meq/L (ref 96–112)
Creatinine, Ser: 2.46 mg/dL — ABNORMAL HIGH (ref 0.40–1.50)
GFR: 28.95 mL/min — ABNORMAL LOW (ref >60.00)
Glucose, Bld: 237 mg/dL — ABNORMAL HIGH (ref 70–99)
Potassium: 5 meq/L (ref 3.5–5.1)
Sodium: 136 meq/L (ref 135–145)

## 2024-04-13 LAB — HEMOGLOBIN A1C: Hgb A1c MFr Bld: 8.1 % — ABNORMAL HIGH (ref 4.6–6.5)

## 2024-04-13 NOTE — Assessment & Plan Note (Signed)
 No signs of fluid overload today. Continue current meds.

## 2024-04-13 NOTE — Assessment & Plan Note (Signed)
 Lab Results  Component Value Date   HGBA1C 7.8 (H) 12/10/2022  -Regular follow-up visit in three months to monitor diabetes control.  -Continue Levemir , Farxiga , and Januvia  -Recheck A1c today

## 2024-04-13 NOTE — Assessment & Plan Note (Signed)
 Following with cardiology. Continue current meds and heart healthy lifestyle.

## 2024-04-13 NOTE — Progress Notes (Signed)
 Established Patient Office Visit  Subjective   Patient ID: Matthew Fisher, male    DOB: 1971/08/01  Age: 53 y.o. MRN: [id number]  Chief Complaint  Patient presents with   Medical Management of Chronic Issues   Diabetes    HPI   Patient is here for routine follow-up, chronic disease management. Due for A1c check. Reports he has been doing well, no acute concerns.   Diabetes: - Checking glucose at home: no, needs to pick up [facility] and restart - Medications: Levemir  10 units daily, Farxiga  10 mg daily, Januvia  25 mg daily - Compliance: good - Diet: trying for low carb, heart healthy - Exercise: walking, active lifestyle - Eye exam: UTD - Foot exam: today - Microalbumin: UTD - Denies symptoms of hypoglycemia, polyuria, polydipsia, numbness extremities, foot ulcers/trauma, wounds that are not healing, medication side effects  Lab Results  Component Value Date   HGBA1C 7.9 12/20/2023    Hypertension, Hx NSTEMI, CHF, HLD: Following with [facility], Dr Raford. - Medications: amlodipine  10 mg daily, atorvastatin  80 mg daily, carvedilol  25 mg BID, ezetimibe  10 mg daily, furosemide  40 mg daily, hydralazine  25 mg BID, lisinopril  40 mg daily - Compliance: good - Checking BP at home: no - Denies any SOB, recurrent headaches, CP, vision changes, LE edema, dizziness, palpitations, or medication side effects. - Diet: low carb, heart healthy  - Exercise: walking - Spring 2025 had a near syncopal episode with chest discomfort which he believes was indigestion - stress test was done 01/16/24: normal, low risk, no ST deviation, LV perfusion normal, no evidence of ischemia/infarct, EF 55%   CKD: - Following with [facility], just saw them in May        ROS All review of systems negative except what is listed in the HPI    Objective:     BP 134/74   Pulse 85   Ht 5' 8 (1.727 m)   Wt 224 lb (101.6 kg)   SpO2 100%   BMI 34.06 kg/m    Physical Exam Vitals  reviewed.  Constitutional:      Appearance: Normal appearance. He is obese.  Cardiovascular:     Rate and Rhythm: Normal rate and regular rhythm.  Pulmonary:     Effort: Pulmonary effort is normal.  Abdominal:     General: There is no distension.     Palpations: There is no mass.     Tenderness: There is no abdominal tenderness. There is no guarding or rebound.  Skin:    General: Skin is warm and dry.  Neurological:     Mental Status: He is alert and oriented to person, place, and time.  Psychiatric:        Mood and Affect: Mood normal.        Behavior: Behavior normal.        Thought Content: Thought content normal.        Judgment: Judgment normal.    Diabetic foot exam was performed.  No deformities or other abnormal visual findings.  Posterior tibialis and dorsalis pulse intact bilaterally.  Intact to touch and monofilament testing bilaterally.  Sensation: reports mildly decreased on left plantar foot, but still intact   No results found for any visits on 04/13/24.    The ASCVD Risk score (Arnett DK, et al., 2019) failed to calculate for the following reasons:   Risk score cannot be calculated because patient has a medical history suggesting prior/existing ASCVD    Assessment & Plan:  Problem List Items Addressed This Visit       Active Problems   Type 2 diabetes mellitus with hyperglycemia, without long-term current use of insulin  (HCC) - Primary (Chronic)   Lab Results  Component Value Date   HGBA1C 7.8 (H) 12/10/2022  -Regular follow-up visit in three months to monitor diabetes control.  -Continue Levemir , Farxiga , and Januvia  -Recheck A1c today      Relevant Orders   Hemoglobin A1c   Basic metabolic panel with GFR   Hypertension (Chronic)   Blood pressure is at goal for age and co-morbidities.   Recommendations: continue current meds - BP goal <130/80 - monitor and log blood pressures at home - check around the same time each day in a relaxed  setting - Limit salt to <2000 mg/day - Follow DASH eating plan (heart healthy diet) - limit alcohol to 2 standard drinks per day for men and 1 per day for women - avoid tobacco products - get at least 2 hours of regular aerobic exercise weekly Patient aware of signs/symptoms requiring further/urgent evaluation. Due for cardiology follow-up in September      CHF (congestive heart failure) (HCC) (Chronic)   No signs of fluid overload today. Continue current meds.      Mixed hyperlipidemia (Chronic)   Following with cardiology. Continue current meds and heart healthy lifestyle.       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 No new symptoms. Avoid nephrotoxic drugs.      Other Visit Diagnoses       Encounter for hepatitis C screening test for low risk patient       Relevant Orders   Hepatitis C antibody          Return in about 3 months (around 07/14/2024) for chronic disease management; schedule AWV.    Waddell KATHEE Mon, NP

## 2024-04-13 NOTE — Assessment & Plan Note (Signed)
 Following with [facility]. No new symptoms. Avoid nephrotoxic drugs.

## 2024-04-13 NOTE — Assessment & Plan Note (Signed)
 Blood pressure is at goal for age and co-morbidities.   Recommendations: continue current meds - BP goal <130/80 - monitor and log blood pressures at home - check around the same time each day in a relaxed setting - Limit salt to <2000 mg/day - Follow DASH eating plan (heart healthy diet) - limit alcohol to 2 standard drinks per day for men and 1 per day for women - avoid tobacco products - get at least 2 hours of regular aerobic exercise weekly Patient aware of signs/symptoms requiring further/urgent evaluation. Due for cardiology follow-up in September

## 2024-04-14 ENCOUNTER — Ambulatory Visit: Payer: Self-pay | Admitting: Family Medicine

## 2024-04-14 LAB — HEPATITIS C ANTIBODY: Hepatitis C Ab: NONREACTIVE

## 2024-04-15 ENCOUNTER — Other Ambulatory Visit: Payer: Self-pay | Admitting: Neurology

## 2024-04-15 MED ORDER — DAPAGLIFLOZIN PROPANEDIOL 10 MG PO TABS: 10.0000 mg | ORAL_TABLET | Freq: Every day | ORAL | 1 refills | Status: AC

## 2024-04-16 DIAGNOSIS — I129 Hypertensive chronic kidney disease with stage 1 through stage 4 chronic kidney disease, or unspecified chronic kidney disease: Secondary | ICD-10-CM | POA: Diagnosis not present

## 2024-04-16 DIAGNOSIS — E11649 Type 2 diabetes mellitus with hypoglycemia without coma: Secondary | ICD-10-CM | POA: Diagnosis not present

## 2024-04-16 DIAGNOSIS — N184 Chronic kidney disease, stage 4 (severe): Secondary | ICD-10-CM | POA: Diagnosis not present

## 2024-04-16 DIAGNOSIS — M109 Gout, unspecified: Secondary | ICD-10-CM | POA: Diagnosis not present

## 2024-04-17 LAB — PROTEIN / CREATININE RATIO, URINE: Creatinine, Urine: 53.3

## 2024-04-20 ENCOUNTER — Encounter: Payer: Self-pay | Admitting: Neurology

## 2024-04-20 LAB — PROTEIN / CREATININE RATIO, URINE: Protein/Creat. Ratio: 1319

## 2024-04-20 LAB — PROTEIN,TOTAL,URINE: PROTEIN,TOTAL,URINE: 70.3

## 2024-05-26 ENCOUNTER — Ambulatory Visit (INDEPENDENT_AMBULATORY_CARE_PROVIDER_SITE_OTHER)

## 2024-05-26 VITALS — Ht 68.0 in | Wt 224.0 lb

## 2024-05-26 DIAGNOSIS — Z0000 Encounter for general adult medical examination without abnormal findings: Secondary | ICD-10-CM

## 2024-05-26 NOTE — Patient Instructions (Addendum)
 Matthew Fisher , Thank you for taking time out of your busy schedule to complete your Annual Wellness Visit with me. I enjoyed our conversation and look forward to speaking with you again next year. I, as well as your care team,  appreciate your ongoing commitment to your health goals. Please review the following plan we discussed and let me know if I can assist you in the future. Your Game plan/ To Do List    Referrals: If you haven't heard from the office you've been referred to, please reach out to them at the phone provided.   Follow up Visits: We will see or speak with you next year for your Next Medicare AWV with our clinical staff 06/01/25 @ 3:10p  Have you seen your provider in the last 6 months (3 months if uncontrolled diabetes)?   Clinician Recommendations:  Aim for 30 minutes of exercise or brisk walking, 6-8 glasses of water, and 5 servings of fruits and vegetables each day.       This is a list of the screenings recommended for you:  Health Maintenance  Topic Date Due   Pneumococcal Vaccine for age over 47 (1 of 2 - PCV) Never done   Zoster (Shingles) Vaccine (1 of 2) Never done   COVID-19 Vaccine (3 - Pfizer risk series) 06/07/2020   Flu Shot  05/08/2024   Hepatitis B Vaccine (1 of 3 - 19+ 3-dose series) 04/09/2025*   Eye exam for diabetics  05/29/2024   Hemoglobin A1C  10/14/2024   Yearly kidney health urinalysis for diabetes  01/03/2025   Yearly kidney function blood test for diabetes  04/13/2025   Complete foot exam   04/13/2025   Medicare Annual Wellness Visit  05/26/2025   Colon Cancer Screening  11/24/2031   DTaP/Tdap/Td vaccine (4 - Td or Tdap) 08/04/2033   Hepatitis C Screening  Completed   HIV Screening  Completed   HPV Vaccine  Aged Out   Meningitis B Vaccine  Aged Out  *Topic was postponed. The date shown is not the original due date.    Advanced directives: (Declined) Advance directive discussed with you today. Even though you declined this today, please  call our office should you change your mind, and we can give you the proper paperwork for you to fill out. Advance Care Planning is important because it:  [x]  Makes sure you receive the medical care that is consistent with your values, goals, and preferences  [x]  It provides guidance to your family and loved ones and reduces their decisional burden about whether or not they are making the right decisions based on your wishes.  Follow the link provided in your after visit summary or read over the paperwork we have mailed to you to help you started getting your Advance Directives in place. If you need assistance in completing these, please reach out to us  so that we can help you!  See attachments for Preventive Care and Fall Prevention Tips.

## 2024-05-26 NOTE — Progress Notes (Signed)
 Subjective:   Matthew Fisher is a 53 y.o. who presents for a Medicare Wellness preventive visit.  As a reminder, Annual Wellness Visits don't include a physical exam, and some assessments may be limited, especially if this visit is performed virtually. We may recommend an in-person follow-up visit with your provider if needed.  Visit Complete: Virtual I connected with  Matthew Fisher on 05/26/24 by a audio enabled telemedicine application and verified that I am speaking with the correct person using two identifiers.  Patient Location: Home  Provider Location: Home Office  I discussed the limitations of evaluation and management by telemedicine. The patient expressed understanding and agreed to proceed.  Vital Signs: Because this visit was a virtual/telehealth visit, some criteria may be missing or patient reported. Any vitals not documented were not able to be obtained and vitals that have been documented are patient reported.    Persons Participating in Visit: Patient assisted by Wife.  AWV Questionnaire: No: Patient Medicare AWV questionnaire was not completed prior to this visit.  Cardiac Risk Factors include: advanced age (>9men, >68 women);male gender;diabetes mellitus;hypertension     Objective:    Today's Vitals   05/26/24 1518  Weight: 224 lb (101.6 kg)  Height: 5' 8 (1.727 m)   Body mass index is 34.06 kg/m.     05/26/2024    3:28 PM 04/23/2023    1:09 PM 04/04/2023    8:33 AM 06/16/2022    3:27 PM 06/08/2022    5:44 AM 04/02/2022    3:58 PM 09/25/2021    9:48 PM  Advanced Directives  Does Patient Have a Medical Advance Directive? No No No No No No No  Would patient like information on creating a medical advance directive? No - Patient declined  No - Patient declined No - Patient declined No - Patient declined No - Patient declined     Current Medications (verified) Outpatient Encounter Medications as of 05/26/2024  Medication Sig   acetaminophen  (TYLENOL )  325 MG tablet Take by mouth.   allopurinol  (ZYLOPRIM ) 300 MG tablet Take 1 tablet by mouth once daily   amLODipine  (NORVASC ) 10 MG tablet Take 1 tablet (10 mg total) by mouth daily.   aspirin  81 MG chewable tablet Chew 1 tablet (81 mg total) by mouth daily.   atorvastatin  (LIPITOR ) 80 MG tablet Take 1 tablet (80 mg total) by mouth daily.   blood glucose meter kit and supplies KIT Dispense based on patient and insurance preference. Use up to four times daily as directed. Please include lancets, test strips, control solution.   Blood Pressure Monitoring (BLOOD PRESSURE CUFF) MISC Use to check blood pressure daily   carvedilol  (COREG ) 25 MG tablet TAKE 1 TABLET BY MOUTH TWICE DAILY WITH A MEAL   cholecalciferol 25 MCG (1000 UT) tablet Take by mouth.   Continuous Glucose Sensor (FREESTYLE LIBRE 3 PLUS SENSOR) MISC Change sensor every 15 days, to check blood sugars continuously   dapagliflozin  propanediol (FARXIGA ) 10 MG TABS tablet Take 1 tablet (10 mg total) by mouth daily.   diclofenac  Sodium (VOLTAREN ) 1 % GEL Apply topically.   esomeprazole  (NEXIUM ) 20 MG capsule Take 1 capsule (20 mg total) by mouth 2 (two) times daily before a meal.   ezetimibe  (ZETIA ) 10 MG tablet Take 1 tablet (10 mg total) by mouth daily.   FEROSUL 325 (65 Fe) MG tablet TAKE 1 TABLET BY MOUTH IN THE MORNING   furosemide  (LASIX ) 40 MG tablet Take 1 tablet by mouth once daily  hydrALAZINE  (APRESOLINE ) 25 MG tablet TAKE 1 TABLET BY MOUTH IN THE MORNING AND AT BEDTIME   insulin  detemir (LEVEMIR  FLEXTOUCH) 100 UNIT/ML FlexPen Inject 10 Units into the skin at bedtime. Start with 10 units every night. Titrate up by 2 units every 3 days if morning/fasting glucose is higher than 130 consistently. (Patient taking differently: Inject 12 Units into the skin at bedtime. Start with 10 units every night. Titrate up by 2 units every 3 days if morning/fasting glucose is higher than 130 consistently.)   Insulin  Pen Needle (PEN NEEDLES) 32G X 4  MM MISC 1 each by Does not apply route daily.   lisinopril  (ZESTRIL ) 40 MG tablet Take 1 tablet by mouth once daily   nitroGLYCERIN  (NITROSTAT ) 0.4 MG SL tablet Place 1 tablet (0.4 mg total) under the tongue every 5 (five) minutes as needed for chest pain.   sitaGLIPtin  (JANUVIA ) 50 MG tablet Take 0.5 tablets (25 mg total) by mouth daily. (Patient taking differently: Take 50 mg by mouth daily.)   tamsulosin  (FLOMAX ) 0.4 MG CAPS capsule Take 1 capsule (0.4 mg total) by mouth daily after supper.   No facility-administered encounter medications on file as of 05/26/2024.    Allergies (verified) Patient has no known allergies.   History: Past Medical History:  Diagnosis Date   Arthritis    CAD (coronary artery disease)    a. NSTEMI 10/2017 s/p multivessel PCI of the OM1, PDA, PLA ostium and mid PLA.   CKD (chronic kidney disease), stage II    GERD (gastroesophageal reflux disease)    Gout    Hypertension    Insulin  dependent diabetes mellitus    Past Surgical History:  Procedure Laterality Date   CORONARY STENT INTERVENTION N/A 10/17/2017   Procedure: CORONARY STENT INTERVENTION;  Surgeon: Dann Candyce RAMAN, MD;  Location: [facility] INVASIVE CV LAB;  Service: Cardiovascular;  Laterality: N/A;   EYE SURGERY     EYE SURGERY Right 08/2022   laser surgery   FRACTURE SURGERY     right ring finger   KNEE ARTHROSCOPY WITH LATERAL MENISECTOMY Left 01/22/2013   Procedure: LEFT KNEE ARTHROSCOPY DEBRIDEMENT CHONDROPLASTY, LATERAL RELEASE AND partial medial meniscectomy;  Surgeon: Reyes JAYSON Billing, MD;  Location: [facility] OR[facility];  Service: Orthopedics;  Laterality: Left;   KNEE SURGERY     removed bone   LEFT HEART CATH N/A 10/17/2017   Procedure: Left Heart Cath;  Surgeon: Dann Candyce RAMAN, MD;  [facility]: [facility] INVASIVE CV LAB;  Service: Cardiovascular;  Laterality: N/A;   LEFT HEART CATH AND CORONARY ANGIOGRAPHY N/A 10/15/2017   Procedure: LEFT HEART CATH AND CORONARY ANGIOGRAPHY;  Surgeon: Anner Alm ORN, MD;  Location: [facility] INVASIVE CV LAB;  Service: Cardiovascular;  Laterality: N/A;   SHOULDER SURGERY Bilateral    Family History  Problem Relation Age of Onset   Diabetes Mother    Cancer Father    Healthy Daughter    Healthy Daughter    Healthy Son    Social History   Socioeconomic History   Marital status: Married    Spouse name: Not on file   Number of children: Not on file   Years of education: Not on file   Highest education level: Not on file  Occupational History   Not on file  Tobacco Use   Smoking status: Former    Current packs/day: 0.00    Average packs/day: 0.5 packs/day for 20.0 years (10.0 ttl pk-yrs)    Types: Cigarettes, Cigars    Start date: 10/08/1982  Quit date: 10/08/2002    Years since quitting: 21.6    Passive exposure: Never   Smokeless tobacco: Never  Vaping Use   Vaping status: Every Day   Substances: Nicotine, Flavoring  Substance and Sexual Activity   Alcohol use: Yes    Comment: occasional   Drug use: No   Sexual activity: Yes  Other Topics Concern   Not on file  Social History Narrative   Not on file   Social Drivers of Health   Financial Resource Strain: Low Risk  (05/26/2024)   Overall Financial Resource Strain (CARDIA)    Difficulty of Paying Living Expenses: Not hard at all  Food Insecurity: No Food Insecurity (05/26/2024)   Hunger Vital Sign    Worried About Running Out of Food in the Last Year: Never true    Ran Out of Food in the Last Year: Never true  Transportation Needs: No Transportation Needs (05/26/2024)   PRAPARE - Administrator, Civil Service (Medical): No    Lack of Transportation (Non-Medical): No  Physical Activity: Sufficiently Active (05/26/2024)   Exercise Vital Sign    Days of Exercise per Week: 4 days    Minutes of Exercise per Session: 120 min  Stress: No Stress Concern Present (05/26/2024)   [employer] of Occupational Health - Occupational Stress Questionnaire    Feeling of Stress:  Not at all  Social Connections: Moderately Isolated (05/26/2024)   Social Connection and Isolation Panel    Frequency of Communication with Friends and Family: More than three times a week    Frequency of Social Gatherings with Friends and Family: More than three times a week    Attends Religious Services: Never    Database administrator or Organizations: No    Attends Engineer, structural: Never    Marital Status: Married    Tobacco Counseling Counseling given: Not Answered    Clinical Intake:  Pre-visit preparation completed: Yes  Pain : No/denies pain     BMI - recorded: 34.06 Nutritional Status: BMI > 30  Obese Nutritional Risks: None Diabetes: Yes CBG done?: Yes (CBG 208 Per patient) CBG resulted in Enter/ Edit results?: Yes Did pt. bring in CBG monitor from home?: No  Lab Results  Component Value Date   HGBA1C 8.1 (H) 04/13/2024   HGBA1C 7.9 12/20/2023   HGBA1C 8.2 (H) 11/11/2023     How often do you need to have someone help you when you read instructions, pamphlets, or other written materials from your doctor or pharmacy?: 1 - Never  Interpreter Needed?: No  Information entered by :: Rojelio Blush LPN   Activities of Daily Living     05/26/2024    3:27 PM  In your present state of health, do you have any difficulty performing the following activities:  Hearing? 0  Vision? 0  Difficulty concentrating or making decisions? 0  Walking or climbing stairs? 0  Dressing or bathing? 0  Doing errands, shopping? 0  Preparing Food and eating ? N  Using the Toilet? N  In the past six months, have you accidently leaked urine? N  Do you have problems with loss of bowel control? N  Managing your Medications? N  Managing your Finances? N  Housekeeping or managing your Housekeeping? N    Patient Care Team: Almarie Waddell NOVAK, NP as PCP - General (Family Medicine) Bernie Lamar PARAS, MD as PCP - Cardiology (Cardiology) Chick Venetia BRAVO, MD as Consulting  Physician (Family Medicine) Deveshwar,  Maya, MD as Consulting Physician (Rheumatology) Festus Ginnie Rei, MD as Referring Physician (Ophthalmology)  I have updated your Care Teams any recent Medical Services you may have received from other providers in the past year.     Assessment:   This is a routine wellness examination for Matthew Fisher.  Hearing/Vision screen Hearing Screening - Comments:: Denies hearing difficulties   Vision Screening - Comments:: Wears rx glasses - up to date with routine eye exams with  [facility]   Goals Addressed               This Visit's Progress     Increase physical activity (pt-stated)        Remain active.       Depression Screen     05/26/2024    3:26 PM 04/04/2023    8:36 AM 12/10/2022    9:01 AM 09/10/2022   11:06 AM 06/12/2022    9:27 AM 04/02/2022    4:00 PM 12/04/2021    2:58 PM  PHQ 2/9 Scores  PHQ - 2 Score 0 0 0 0 0 0 0    Fall Risk     05/26/2024    3:27 PM 04/04/2023    8:33 AM 12/10/2022    9:01 AM 09/10/2022   11:06 AM 06/12/2022    9:27 AM  Fall Risk   Falls in the past year? 0 0 0 0 0  Number falls in past yr: 0 0 0 0 0  Injury with Fall? 0 0 0 0 0  Risk for fall due to : No Fall Risks No Fall Risks No Fall Risks No Fall Risks   Follow up Falls evaluation completed Falls evaluation completed Falls evaluation completed Falls evaluation completed  Falls evaluation completed      Data saved with a previous flowsheet row definition    MEDICARE RISK AT HOME:  Medicare Risk at Home Any stairs in or around the home?: No If so, are there any without handrails?: No Home free of loose throw rugs in walkways, pet beds, electrical cords, etc?: Yes Adequate lighting in your home to reduce risk of falls?: Yes Life alert?: No Use of a cane, walker or w/c?: No Grab bars in the bathroom?: No Shower chair or bench in shower?: No Elevated toilet seat or a handicapped toilet?: No  TIMED UP AND GO:  Was the test performed?   No  Cognitive Function: 6CIT completed        05/26/2024    3:28 PM 04/04/2023    8:38 AM 04/02/2022    4:20 PM  6CIT Screen  What Year? 0 points 0 points 0 points  What month? 0 points 0 points 0 points  What time? 0 points 0 points 0 points  Count back from 20 0 points 0 points 0 points  Months in reverse 0 points 0 points 0 points  Repeat phrase 0 points 0 points 0 points  Total Score 0 points 0 points 0 points    Immunizations Immunization History  Administered Date(s) Administered   PFIZER(Purple Top)SARS-COV-2 Vaccination 04/19/2020, 05/10/2020   Tdap 12/12/2004, 05/09/2011, 08/05/2023    Screening Tests Health Maintenance  Topic Date Due   Pneumococcal Vaccine: 50+ Years (1 of 2 - PCV) Never done   Zoster Vaccines- Shingrix (1 of 2) Never done   COVID-19 Vaccine (3 - Pfizer risk series) 06/07/2020   INFLUENZA VACCINE  05/08/2024   Hepatitis B Vaccines 19-59 Average Risk (1 of 3 - 19+ 3-dose series)  04/09/2025 (Originally 09/01/1990)   OPHTHALMOLOGY EXAM  05/29/2024   HEMOGLOBIN A1C  10/14/2024   Diabetic kidney evaluation - Urine ACR  01/03/2025   Diabetic kidney evaluation - eGFR measurement  04/13/2025   FOOT EXAM  04/13/2025   Medicare Annual Wellness (AWV)  05/26/2025   Colonoscopy  11/24/2031   DTaP/Tdap/Td (4 - Td or Tdap) 08/04/2033   Hepatitis C Screening  Completed   HIV Screening  Completed   HPV VACCINES  Aged Out   Meningococcal B Vaccine  Aged Out    Health Maintenance  Health Maintenance Due  Topic Date Due   Pneumococcal Vaccine: 50+ Years (1 of 2 - PCV) Never done   Zoster Vaccines- Shingrix (1 of 2) Never done   COVID-19 Vaccine (3 - Pfizer risk series) 06/07/2020   INFLUENZA VACCINE  05/08/2024   Health Maintenance Items Addressed:   Additional Screening:  Vision Screening: Recommended annual ophthalmology exams for early detection of glaucoma and other disorders of the eye. Would you like a referral to an eye doctor? No     Dental Screening: Recommended annual dental exams for proper oral hygiene  Community Resource Referral / Chronic Care Management: CRR required this visit?  No   CCM required this visit?  No   Plan:    I have personally reviewed and noted the following in the patient's chart:   Medical and social history Use of alcohol, tobacco or illicit drugs  Current medications and supplements including opioid prescriptions. Patient is not currently taking opioid prescriptions. Functional ability and status Nutritional status Physical activity Advanced directives List of other physicians Hospitalizations, surgeries, and ER visits in previous 12 months Vitals Screenings to include cognitive, depression, and falls Referrals and appointments  In addition, I have reviewed and discussed with patient certain preventive protocols, quality metrics, and best practice recommendations. A written personalized care plan for preventive services as well as general preventive health recommendations were provided to patient.   Rojelio LELON Blush, LPN   [id number]   After Visit Summary: (MyChart) Due to this being a telephonic visit, the after visit summary with patients personalized plan was offered to patient via MyChart   Notes: Nothing significant to report at this time.

## 2024-06-19 ENCOUNTER — Other Ambulatory Visit: Payer: Self-pay | Admitting: Family Medicine

## 2024-06-19 DIAGNOSIS — E782 Mixed hyperlipidemia: Secondary | ICD-10-CM

## 2024-06-22 ENCOUNTER — Encounter: Payer: Self-pay | Admitting: Endocrinology

## 2024-06-22 ENCOUNTER — Ambulatory Visit: Admitting: Endocrinology

## 2024-06-22 DIAGNOSIS — Z794 Long term (current) use of insulin: Secondary | ICD-10-CM

## 2024-06-22 DIAGNOSIS — E1169 Type 2 diabetes mellitus with other specified complication: Secondary | ICD-10-CM

## 2024-06-22 DIAGNOSIS — E1165 Type 2 diabetes mellitus with hyperglycemia: Secondary | ICD-10-CM

## 2024-06-22 MED ORDER — SITAGLIPTIN PHOSPHATE 25 MG PO TABS
25.0000 mg | ORAL_TABLET | Freq: Every day | ORAL | 3 refills | Status: AC
Start: 2024-06-22 — End: 2025-06-22

## 2024-06-22 MED ORDER — FREESTYLE LIBRE 3 PLUS SENSOR MISC: 3 refills | Status: AC

## 2024-06-22 MED ORDER — LANTUS SOLOSTAR 100 UNIT/ML ~~LOC~~ SOPN: 15.0000 [IU] | PEN_INJECTOR | Freq: Every day | SUBCUTANEOUS | 4 refills | Status: AC

## 2024-06-22 NOTE — Patient Instructions (Signed)
 Diabetes regimen:  Increase lantus  to 15 units daily. Januvia  25mg  daily. Farxiga  10mg  daily.  Ask with your retina specialist , if you can take GLP1R agonist like Ozempic based on retinopathy.

## 2024-06-22 NOTE — Progress Notes (Unsigned)
 Outpatient Endocrinology Note [place] Fergus Throne, MD   Patient's Name: Matthew Fisher    DOB: 16-Sep-1971    MRN: [id number]                                                    REASON OF VISIT: New consult for type 2 diabetes mellitus  REFERRING PROVIDER: Raford Riggs, MD  PCP: Almarie Waddell NOVAK, NP  HISTORY OF PRESENT ILLNESS:   Evangelos Paulino is a 53 y.o. old male with past medical history listed below, is here for new consult for type 2 diabetes mellitus.   Pertinent Diabetes History: Patient is referred to endocrinology for evaluation and management of uncontrolled type 2 diabetes mellitus.  Initial consult on June 22, 2024.  Patient was diagnosed with type 2 diabetes mellitus in ~ 2010.  He has been on insulin  therapy from 2013.  He has uncontrolled diabetes mellitus with hemoglobin A1c mostly in the range of 8 to 9% range.  Hemoglobin A1c was 8.1% in July 2025.  History of DKA or diabetes related hospitalizations: none  Previous diabetes education: ?   Family h/o diabetes mellitus: mother had type 2 Diabetes.   No personal history of pancreatitis and / or family history of medullary thyroid  carcinoma or MEN 2B syndrome.   Chronic Diabetes Complications : Retinopathy: yes, proliferative DM with macular edema. Last ophthalmology exam was done on every 3-4 months, following with ophthalmology regularly.  Nephropathy: CKD IV, on ACE/ARB / lisinopril  ( on hold) , farxiga , following with nephrology.  Peripheral neuropathy: yes Coronary artery disease: yes Stroke: no  Relevant comorbidities and cardiovascular risk factors: Obesity: yes Body mass index is 34.12 kg/m.  Hypertension: Yes  Hyperlipidemia : Yes, on statin   Current / Home Diabetic regimen includes:  Levemir  10 units at bedtime Farxiga  10 mg daily. Januvia  25 mg daily.  Prior diabetic medications: Metfromin stopped due to CKD  Glycemic data:   Patient has freestyle libre 3+ CGM however has not been  seen for more than 3 months as he was on vacation.  No glucose data to review.  Hypoglycemia: Patient has no hypoglycemic episodes. Patient has hypoglycemia awareness.  Factors modifying glucose control: 1.  Diabetic diet assessment: 3 meals a day.  Usually diet soda.  2.  Staying active or exercising:   3.  Medication compliance: compliant all of the time.  Interval history  Patient presented to establish endocrinology care for uncontrolled type 2 diabetes mellitus.  He has complaints of numbness and ting of the feet.  He has diabetic retinopathy regularly following with retina specialist.  REVIEW OF SYSTEMS As per history of present illness.   PAST MEDICAL HISTORY: Past Medical History:  Diagnosis Date   Arthritis    CAD (coronary artery disease)    a. NSTEMI 10/2017 s/p multivessel PCI of the OM1, PDA, PLA ostium and mid PLA.   CKD (chronic kidney disease), stage II    GERD (gastroesophageal reflux disease)    Gout    Hypertension    Insulin  dependent diabetes mellitus     PAST SURGICAL HISTORY: Past Surgical History:  Procedure Laterality Date   CORONARY STENT INTERVENTION N/A 10/17/2017   Procedure: CORONARY STENT INTERVENTION;  Surgeon: Dann Candyce RAMAN, MD;  Location: [facility];  Service: Cardiovascular;  Laterality: N/A;   EYE SURGERY  EYE SURGERY Right 08/2022   laser surgery   FRACTURE SURGERY     right ring finger   KNEE ARTHROSCOPY WITH LATERAL MENISECTOMY Left 01/22/2013   Procedure: LEFT KNEE ARTHROSCOPY DEBRIDEMENT CHONDROPLASTY, LATERAL RELEASE AND partial medial meniscectomy;  Surgeon: Reyes JAYSON Billing, MD;  Location: [facility];  Service: Orthopedics;  Laterality: Left;   KNEE SURGERY     removed bone   LEFT HEART CATH N/A 10/17/2017   Procedure: Left Heart Cath;  Surgeon: Dann Candyce RAMAN, MD;  Location: [facility] INVASIVE CV LAB;  Service: Cardiovascular;  Laterality: N/A;   LEFT HEART CATH AND CORONARY ANGIOGRAPHY N/A 10/15/2017    Procedure: LEFT HEART CATH AND CORONARY ANGIOGRAPHY;  Surgeon: Anner Alm ORN, MD;  Location: [facility] INVASIVE CV LAB;  Service: Cardiovascular;  Laterality: N/A;   SHOULDER SURGERY Bilateral     ALLERGIES: No Known Allergies  FAMILY HISTORY:  Family History  Problem Relation Age of Onset   Diabetes Mother    Cancer Father    Healthy Daughter    Healthy Daughter    Healthy Son     SOCIAL HISTORY: Social History   Socioeconomic History   Marital status: Married    Spouse name: Not on file   Number of children: Not on file   Years of education: Not on file   Highest education level: Not on file  Occupational History   Not on file  Tobacco Use   Smoking status: Former    Current packs/day: 0.00    Average packs/day: 0.5 packs/day for 20.0 years (10.0 ttl pk-yrs)    Types: Cigarettes, Cigars    Start date: 10/08/1982    Quit date: 10/08/2002    Years since quitting: 21.7    Passive exposure: Never   Smokeless tobacco: Never  Vaping Use   Vaping status: Every Day   Substances: Nicotine, Flavoring  Substance and Sexual Activity   Alcohol use: Yes    Comment: occasional   Drug use: No   Sexual activity: Yes  Other Topics Concern   Not on file  Social History Narrative   Not on file   Social Drivers of Health   Financial Resource Strain: Low Risk  (05/26/2024)   Overall Financial Resource Strain (CARDIA)    Difficulty of Paying Living Expenses: Not hard at all  Food Insecurity: No Food Insecurity (05/26/2024)   Hunger Vital Sign    Worried About Running Out of Food in the Last Year: Never true    Ran Out of Food in the Last Year: Never true  Transportation Needs: No Transportation Needs (05/26/2024)   PRAPARE - Administrator, Civil Service (Medical): No    Lack of Transportation (Non-Medical): No  Physical Activity: Sufficiently Active (05/26/2024)   Exercise Vital Sign    Days of Exercise per Week: 4 days    Minutes of Exercise per Session: 120 min   Stress: No Stress Concern Present (05/26/2024)   [employer] of Occupational Health - Occupational Stress Questionnaire    Feeling of Stress: Not at all  Social Connections: Moderately Isolated (05/26/2024)   Social Connection and Isolation Panel    Frequency of Communication with Friends and Family: More than three times a week    Frequency of Social Gatherings with Friends and Family: More than three times a week    Attends Religious Services: Never    Database administrator or Organizations: No    Attends Banker Meetings: Never    Marital  Status: Married    MEDICATIONS:  Current Outpatient Medications  Medication Sig Dispense Refill   acetaminophen  (TYLENOL ) 325 MG tablet Take by mouth.     allopurinol  (ZYLOPRIM ) 300 MG tablet Take 1 tablet by mouth once daily 30 tablet 0   amLODipine  (NORVASC ) 10 MG tablet Take 1 tablet by mouth once daily 90 tablet 1   aspirin  81 MG chewable tablet Chew 1 tablet (81 mg total) by mouth daily. 30 tablet 11   atorvastatin  (LIPITOR ) 80 MG tablet Take 1 tablet by mouth once daily 90 tablet 1   blood glucose meter kit and supplies KIT Dispense based on patient and insurance preference. Use up to four times daily as directed. Please include lancets, test strips, control solution. 1 each 0   Blood Pressure Monitoring (BLOOD PRESSURE CUFF) MISC Use to check blood pressure daily     carvedilol  (COREG ) 25 MG tablet TAKE 1 TABLET BY MOUTH TWICE DAILY WITH A MEAL 180 tablet 0   cholecalciferol 25 MCG (1000 UT) tablet Take by mouth.     dapagliflozin  propanediol (FARXIGA ) 10 MG TABS tablet Take 1 tablet (10 mg total) by mouth daily. 90 tablet 1   diclofenac  Sodium (VOLTAREN ) 1 % GEL Apply topically.     esomeprazole  (NEXIUM ) 20 MG capsule Take 1 capsule (20 mg total) by mouth 2 (two) times daily before a meal. 60 capsule 3   ezetimibe  (ZETIA ) 10 MG tablet Take 1 tablet (10 mg total) by mouth daily. 90 tablet 3   FEROSUL 325 (65 Fe) MG tablet  TAKE 1 TABLET BY MOUTH IN THE MORNING 90 tablet 0   furosemide  (LASIX ) 40 MG tablet Take 1 tablet by mouth once daily 90 tablet 0   hydrALAZINE  (APRESOLINE ) 25 MG tablet TAKE 1 TABLET BY MOUTH IN THE MORNING AND AT BEDTIME 180 tablet 3   insulin  detemir (LEVEMIR  FLEXTOUCH) 100 UNIT/ML FlexPen Inject 10 Units into the skin at bedtime. Start with 10 units every night. Titrate up by 2 units every 3 days if morning/fasting glucose is higher than 130 consistently. 15 mL 11   insulin  glargine (LANTUS  SOLOSTAR) 100 UNIT/ML Solostar Pen Inject 15 Units into the skin daily. 15 mL 4   Insulin  Pen Needle (PEN NEEDLES) 32G X 4 MM MISC 1 each by Does not apply route daily. 100 each 4   lisinopril  (ZESTRIL ) 40 MG tablet Take 1 tablet by mouth once daily 90 tablet 0   nitroGLYCERIN  (NITROSTAT ) 0.4 MG SL tablet Place 1 tablet (0.4 mg total) under the tongue every 5 (five) minutes as needed for chest pain. 10 tablet 1   tamsulosin  (FLOMAX ) 0.4 MG CAPS capsule Take 1 capsule (0.4 mg total) by mouth daily after supper. 90 capsule 0   Continuous Glucose Sensor (FREESTYLE LIBRE 3 PLUS SENSOR) MISC Change sensor every 15 days, to check blood sugars continuously 6 each 3   sitaGLIPtin  (JANUVIA ) 25 MG tablet Take 1 tablet (25 mg total) by mouth daily. 90 tablet 3   No current facility-administered medications for this visit.    PHYSICAL EXAM: Vitals:   06/22/24 1526 06/22/24 1527  BP: (!) 156/82 (!) 146/80  Pulse: 94   Resp: 20   SpO2: 99%   Weight: 224 lb 6.4 oz (101.8 kg)   Height: 5' 8 (1.727 m)    Body mass index is 34.12 kg/m.  Wt Readings from Last 3 Encounters:  06/22/24 224 lb 6.4 oz (101.8 kg)  05/26/24 224 lb (101.6 kg)  04/13/24 224 lb (  101.6 kg)    General: Well developed, well nourished male in no apparent distress.  HEENT: AT/[place], no external lesions.  Eyes: Conjunctiva clear and no icterus. Neck: Neck supple  Lungs: Respirations not labored Neurologic: Alert, oriented, normal  speech Extremities / Skin: Dry. No sores or rashes noted.  Psychiatric: Does not appear depressed or anxious  Diabetic Foot Exam - Simple   No data filed    LABS Reviewed Lab Results  Component Value Date   HGBA1C 8.1 (H) 04/13/2024   HGBA1C 7.9 12/20/2023   HGBA1C 8.2 (H) 11/11/2023   No results found for: FRUCTOSAMINE Lab Results  Component Value Date   CHOL 98 (L) 02/10/2024   HDL 32 (L) 02/10/2024   LDLCALC 45 02/10/2024   TRIG 115 02/10/2024   CHOLHDL 3.1 02/10/2024   Lab Results  Component Value Date   MICRALBCREAT 1,541 01/04/2024   MICRALBCREAT 2,148 10/27/2022   Lab Results  Component Value Date   CREATININE 2.46 (H) 04/13/2024   Lab Results  Component Value Date   GFR 28.95 (L) 04/13/2024    ASSESSMENT / PLAN  1. Type 2 diabetes mellitus with other specified complication, with long-term current use of insulin  (HCC)     Diabetes Mellitus type 2, complicated by proliferative diabetic retinopathy, neuropathy, coronary artery disease and CKD. - Diabetic status / severity: Uncontrolled.  Lab Results  Component Value Date   HGBA1C 8.1 (H) 04/13/2024    - Hemoglobin A1c goal : <6.5%  Discussed about type 2 diabetes mellitus and potential chronic diabetic complications.  Discussed about importance of controlling blood sugar to prevent and to prevent worsening of diabetic complications.  He has limited options for antidiabetic medication due to CKD 4 and proliferative diabetic retinopathy.  He has proliferative type retinopathy, following regularly with retinal specialist.  I will avoid GLP-1 receptor agonist at this time until cleared by [facility]illard by ophthalmology.  Patient is asked to talk with retina specialist/ophthalmology when he has follow-up if he can take GLP-1 receptor agonist medication for example Ozempic.  - Medications: See below  I) he is currently taking Levemir , changed to Lantus  and increased from 10 to 15 units daily. II) continue  Januvia  25 mg daily.  Renally dosed. III) continue Farxiga  10 mg daily.  No glucose data available to review.  - Home glucose testing: Continue CGM, he has freestyle libre 3 sensor.  Check blood sugar as needed.  - Discussed/ Gave Hypoglycemia treatment plan.  # Consult : Recommended diabetic educator/dietitian visit, patient declined.  # Annual urine for microalbuminuria/ creatinine ratio, + microalbuminuria currently, CKD 4.  Following with nephrology.  Used to be on lisinopril , was discontinued previously.  Currently on Farxiga . Last  Lab Results  Component Value Date   MICRALBCREAT 1,541 01/04/2024    # Foot check nightly / neuropathy.  # He has proliferative diabetic retinopathy, regular follow-up with retinal specialist and ophthalmology.   - Diet: Make healthy diabetic food choices, discussed in detail about diet plan, portion control, limiting carbohydrates and avoiding frequent snacks. - Life style / activity / exercise: Discussed.  2. Blood pressure  -  BP Readings from Last 1 Encounters:  06/22/24 (!) 146/80    - Control is in target.  - No change in current plans.  3. Lipid status / Hyperlipidemia - Last  Lab Results  Component Value Date   LDLCALC 45 02/10/2024   - Continue atorvastatin  80 mg daily.  Managed by PCP.  Diagnoses and all orders for this  visit:  Type 2 diabetes mellitus with other specified complication, with long-term current use of insulin  (HCC) -     sitaGLIPtin  (JANUVIA ) 25 MG tablet; Take 1 tablet (25 mg total) by mouth daily. -     insulin  glargine (LANTUS  SOLOSTAR) 100 UNIT/ML Solostar Pen; Inject 15 Units into the skin daily. -     Continuous Glucose Sensor (FREESTYLE LIBRE 3 PLUS SENSOR) MISC; Change sensor every 15 days, to check blood sugars continuously    DISPOSITION Follow up in clinic in 6 to 8 weeks suggested.   All questions answered and patient verbalized understanding of the plan.  [place] Haroon Shatto, MD [facility]  Endocrinology [facility] [address] [place], [address] [place], [place] [address] Phone # [phone]  At least part of this note was generated using voice recognition software. Inadvertent word errors may have occurred, which were not recognized during the proofreading process.

## 2024-06-23 ENCOUNTER — Encounter: Payer: Self-pay | Admitting: Endocrinology

## 2024-06-29 ENCOUNTER — Encounter (HOSPITAL_BASED_OUTPATIENT_CLINIC_OR_DEPARTMENT_OTHER): Payer: Self-pay | Admitting: Cardiovascular Disease

## 2024-06-29 ENCOUNTER — Ambulatory Visit (HOSPITAL_BASED_OUTPATIENT_CLINIC_OR_DEPARTMENT_OTHER): Admitting: Cardiovascular Disease

## 2024-06-29 VITALS — BP 160/82 | HR 88 | Resp 17 | Ht 68.0 in | Wt 223.0 lb

## 2024-06-29 DIAGNOSIS — Z720 Tobacco use: Secondary | ICD-10-CM

## 2024-06-29 DIAGNOSIS — I10 Essential (primary) hypertension: Secondary | ICD-10-CM

## 2024-06-29 DIAGNOSIS — I2510 Atherosclerotic heart disease of native coronary artery without angina pectoris: Secondary | ICD-10-CM | POA: Diagnosis not present

## 2024-06-29 DIAGNOSIS — I214 Non-ST elevation (NSTEMI) myocardial infarction: Secondary | ICD-10-CM

## 2024-06-29 DIAGNOSIS — E782 Mixed hyperlipidemia: Secondary | ICD-10-CM

## 2024-06-29 MED ORDER — NEBIVOLOL HCL 20 MG PO TABS: 20.0000 mg | ORAL_TABLET | Freq: Every day | ORAL | 3 refills | Status: AC

## 2024-06-29 NOTE — Patient Instructions (Addendum)
 Medication Instructions:  STOP CARVEDILOL    START NEBIVOLOL  20 MG DAILY   Labwork: NONE   Testing/Procedures: NONE  Follow-Up: 09/30/2024 10:30 am with [name]   If you need a refill on your cardiac medications before your next appointment, please call your pharmacy.

## 2024-06-29 NOTE — Progress Notes (Signed)
 [facility] Initial Assessment:    Date:  06/29/2024   ID:  Calvyn Kurtzman, DOB Jun 25, 1971, MRN [id number]  PCP:  Almarie Waddell NOVAK, NP  Cardiologist:  Lamar Fitch, MD   Referring MD: Almarie Waddell NOVAK, NP   CC: Hypertension  History of Present Illness:    Sanchez Hemmer is a 53 y.o. male with a hx of hypertension, CAD with NSTEMI 10/2017 s/p multivessel PCI of OM1, PDA, PLA ostium, and mid PLA, GERD, CKD IIIb, diabetes, prior tobacco abuse, and gout, here to establish care in the [facility]. He was seen by Waddell Almarie, NP 12/10/2022 and his blood pressure was 143/78. His lisinopril  was increased to 40 mg daily. He presented at his PCP's office 12/27/2022 for a blood pressure check. His blood pressure readings showed: 9:05 AM left arm 146/86, 9:07 AM right arm 158/86, 9:11 AM left arm 150/82, 9:13 AM right arm 148/86. He was compliant with amlodipine  10mg , coreg  25mg , lisinopril  40mg . He was referred to the [facility] and advised to obtain a home BP cuff.    At his visit 02/2023 his BP at home was running in the 140s/80s.  Hydralazine  was added.  He was referred for a home sleep study that was not performed.  He has a history of a heart attack in 2019 and has had a stress test and echocardiogram in April 2023, which were normal.  At his visit 01/2024 BP was slgithly above goal on amlodipine , carvedilol , hydralazine  and lisinopril .  He reported recurrent cough syncope.  He was referred for Lexiscan  Myoview  01/2024 which revealed LVEF 55% and no ischemia. Labs were negative for hyperaldosteronism in 2024.  Thyroid  function was normal.   Discussed the use of AI scribe software for clinical note transcription with the patient, who gave verbal consent to proceed. History of Present Illness Mr. Hammitt experiences inconsistent medication adherence due to side effects, particularly fatigue and sluggishness, which he attributes to carvedilol . This  affects his ability to work, leading him to skip doses and take his medications only three to four times a week, depending on his workload and assistance from coworkers.  His current medications include carvedilol , lisinopril , and Farxiga . He notes variable blood pressure readings at home, with a recent reading of 160/82 mmHg and a previous reading of 180/unknown, which later decreased. He struggles with the side effects of his medications.  A kidney doctor, possibly from [facility], previously adjusted his medication regimen by stopping amlodipine  and hydralazine , leaving him on carvedilol  and lisinopril  for blood pressure management. He is unsure which medication causes his fatigue, leading to his decision to sometimes not take any medications at all.  He remains active, although he does not specify the type of exercise. He did not take his medication on the day of the visit, as he left the house early. He expresses frustration with the number of pills he has to take, stating, 'That's too many pills.'  He feels tired and sluggish when taking medications, particularly carvedilol .  Previous antihypertensives:   Past Medical History:  Diagnosis Date   Arthritis    CAD (coronary artery disease)    a. NSTEMI 10/2017 s/p multivessel PCI of the OM1, PDA, PLA ostium and mid PLA.   CKD (chronic kidney disease), stage II    GERD (gastroesophageal reflux disease)    Gout    Hypertension    Insulin  dependent diabetes mellitus     Past Surgical History:  Procedure Laterality Date   CORONARY  STENT INTERVENTION N/A 10/17/2017   Procedure: CORONARY STENT INTERVENTION;  Surgeon: Dann Candyce RAMAN, MD;  Location: [facility] INVASIVE CV LAB;  Service: Cardiovascular;  Laterality: N/A;   EYE SURGERY     EYE SURGERY Right 08/2022   laser surgery   FRACTURE SURGERY     right ring finger   KNEE ARTHROSCOPY WITH LATERAL MENISECTOMY Left 01/22/2013   Procedure: LEFT KNEE ARTHROSCOPY DEBRIDEMENT  CHONDROPLASTY, LATERAL RELEASE AND partial medial meniscectomy;  Surgeon: Reyes JAYSON Billing, MD;  Location: [facility] ORS;  Service: Orthopedics;  Laterality: Left;   KNEE SURGERY     removed bone   LEFT HEART CATH N/A 10/17/2017   Procedure: Left Heart Cath;  Surgeon: Dann Candyce RAMAN, MD;  Location: [facility] INVASIVE CV LAB;  Service: Cardiovascular;  Laterality: N/A;   LEFT HEART CATH AND CORONARY ANGIOGRAPHY N/A 10/15/2017   Procedure: LEFT HEART CATH AND CORONARY ANGIOGRAPHY;  Surgeon: Anner Alm ORN, MD;  Location[facility] INVASIVE CV LAB;  Service: Cardiovascular;  Laterality: N/A;   SHOULDER SURGERY Bilateral     Current Medications: Current Meds  Medication Sig   acetaminophen  (TYLENOL ) 325 MG tablet Take by mouth.   allopurinol  (ZYLOPRIM ) 300 MG tablet Take 1 tablet by mouth once daily   aspirin  81 MG chewable tablet Chew 1 tablet (81 mg total) by mouth daily.   atorvastatin  (LIPITOR ) 80 MG tablet Take 1 tablet by mouth once daily   cholecalciferol 25 MCG (1000 UT) tablet Take by mouth.   dapagliflozin  propanediol (FARXIGA ) 10 MG TABS tablet Take 1 tablet (10 mg total) by mouth daily.   diclofenac  Sodium (VOLTAREN ) 1 % GEL Apply topically.   esomeprazole  (NEXIUM ) 20 MG capsule Take 1 capsule (20 mg total) by mouth 2 (two) times daily before a meal.   ezetimibe  (ZETIA ) 10 MG tablet Take 1 tablet (10 mg total) by mouth daily.   FEROSUL 325 (65 Fe) MG tablet TAKE 1 TABLET BY MOUTH IN THE MORNING   furosemide  (LASIX ) 40 MG tablet Take 1 tablet by mouth once daily   insulin  detemir (LEVEMIR  FLEXTOUCH) 100 UNIT/ML FlexPen Inject 10 Units into the skin at bedtime. Start with 10 units every night. Titrate up by 2 units every 3 days if morning/fasting glucose is higher than 130 consistently. (Patient taking differently: Inject 10 Units into the skin at bedtime. Start with 15 units every night. Titrate up by 2 units every 3 days if morning/fasting glucose is higher than 130 consistently.)   Insulin  Pen  Needle (PEN NEEDLES) 32G X 4 MM MISC 1 each by Does not apply route daily.   lisinopril  (ZESTRIL ) 40 MG tablet Take 1 tablet by mouth once daily   Nebivolol  HCl 20 MG TABS Take 1 tablet (20 mg total) by mouth daily.   nitroGLYCERIN  (NITROSTAT ) 0.4 MG SL tablet Place 1 tablet (0.4 mg total) under the tongue every 5 (five) minutes as needed for chest pain.   sitaGLIPtin  (JANUVIA ) 25 MG tablet Take 1 tablet (25 mg total) by mouth daily.   tamsulosin  (FLOMAX ) 0.4 MG CAPS capsule Take 1 capsule (0.4 mg total) by mouth daily after supper.   [DISCONTINUED] amLODipine  (NORVASC ) 10 MG tablet Take 1 tablet by mouth once daily   [DISCONTINUED] carvedilol  (COREG ) 25 MG tablet TAKE 1 TABLET BY MOUTH TWICE DAILY WITH A MEAL   [DISCONTINUED] hydrALAZINE  (APRESOLINE ) 25 MG tablet TAKE 1 TABLET BY MOUTH IN THE MORNING AND AT BEDTIME    Allergies:   Patient has no known allergies.   Social  History   Socioeconomic History   Marital status: Married    Spouse name: Not on file   Number of children: 4   Years of education: Not on file   Highest education level: Not on file  Occupational History   Not on file  Tobacco Use   Smoking status: Former    Current packs/day: 0.00    Average packs/day: 0.5 packs/day for 20.0 years (10.0 ttl pk-yrs)    Types: Cigarettes, Cigars    Start date: 10/08/1982    Quit date: 10/08/2002    Years since quitting: 21.7    Passive exposure: Never   Smokeless tobacco: Never  Vaping Use   Vaping status: Every Day   Substances: Nicotine, Flavoring  Substance and Sexual Activity   Alcohol use: Yes    Comment: occasional   Drug use: No   Sexual activity: Yes  Other Topics Concern   Not on file  Social History Narrative   Not on file   Social Drivers of Health   Financial Resource Strain: Low Risk  (05/26/2024)   Overall Financial Resource Strain (CARDIA)    Difficulty of Paying Living Expenses: Not hard at all  Food Insecurity: No Food Insecurity (05/26/2024)   Hunger  Vital Sign    Worried About Running Out of Food in the Last Year: Never true    Ran Out of Food in the Last Year: Never true  Transportation Needs: No Transportation Needs (05/26/2024)   PRAPARE - Administrator, Civil Service (Medical): No    Lack of Transportation (Non-Medical): No  Physical Activity: Sufficiently Active (05/26/2024)   Exercise Vital Sign    Days of Exercise per Week: 4 days    Minutes of Exercise per Session: 120 min  Stress: No Stress Concern Present (05/26/2024)   [employer] of Occupational Health - Occupational Stress Questionnaire    Feeling of Stress: Not at all  Social Connections: Moderately Isolated (05/26/2024)   Social Connection and Isolation Panel    Frequency of Communication with Friends and Family: More than three times a week    Frequency of Social Gatherings with Friends and Family: More than three times a week    Attends Religious Services: Never    Database administrator or Organizations: No    Attends Engineer, structural: Never    Marital Status: Married     Family History: The patient's family history includes Cancer in his father; Diabetes in his mother; Healthy in his daughter, daughter, and son.  ROS:   Please see the history of present illness.    (+) Shortness of breath (+) LE swelling (+) Snoring (+) Daytime somnolence All other systems reviewed and are negative.  EKGs/Labs/Other Studies Reviewed:    Echo  01/22/2022:  1. Left ventricular ejection fraction, by estimation, is 55 to 60%. The  left ventricle has normal function. The left ventricle has no regional  wall motion abnormalities. There is mild concentric left ventricular  hypertrophy. Left ventricular diastolic  parameters are consistent with Grade I diastolic dysfunction (impaired  relaxation).   2. Right ventricular systolic function is normal. The right ventricular  size is normal. There is normal pulmonary artery systolic pressure.   3.  Left atrial size was moderately dilated.   4. The mitral valve is normal in structure. Trivial mitral valve  regurgitation. No evidence of mitral stenosis.   5. The aortic valve is tricuspid. Aortic valve regurgitation is not  visualized. No aortic stenosis is  present.   6. The inferior vena cava is normal in size with greater than 50%  respiratory variability, suggesting right atrial pressure of 3 mmHg.   Exercise Stress Myoview   01/16/2022:   The study is normal. The study is low risk.   No ST deviation was noted.   LV perfusion is normal.   Left ventricular function is normal. Nuclear stress EF: 57 %. The left ventricular ejection fraction is normal (55-65%). End diastolic cavity size is normal.   Prior study not available for comparison. Low risk stress nuclear study with normal perfusion and normal left ventricular regional and global systolic function.  Coronary Stent Intervention/Left Heart Cath  10/17/2017: Dist LAD lesion is 100% stenosed. Mid LAD lesion is 70% stenosed. Ost 1st Mrg lesion is 80% stenosed. A drug-eluting stent was successfully placed using a STENT SYNERGY DES 2.75X12. Post intervention, there is a 0% residual stenosis. Ost RPDA lesion is 75% stenosed. A drug-eluting stent was successfully placed using a STENT SYNERGY DES 3X16. Post intervention, there is a 0% residual stenosis. Post Atrio-2 lesion is 70% stenosed. Post Atrio-3 lesion is 95% stenosed. A drug-eluting stent was successfully placed using a STENT SYNERGY DES 2.5X12. A second stent, 2.25 x 12 Synergy was placed overlapping. Post intervention, there is a 0% residual stenosis. Kissing balloon angioplasty was done at the ostium of the PDA and the PLA. The ostium of the posterolateral artery was treated Using a BALLOON SAPPHIRE 2.5X12. Post intervention, there is a 40% residual stenosis at the ostium of the PLA. LV end diastolic pressure is normal. There is no aortic valve stenosis. A drug-eluting  stent was successfully placed.   Successful PCI of the OM1, PDA, PLA ostium and mid PLA.     Continue dual antiplatelet therapy for at least one year along with aggressive secondary prevention.     Would consider indefinite clopidogrel  given the extent of his disease.   Diagnostic Dominance: Right  Intervention     Left Heart Cath  10/15/2017: Distal RCA bifurcation lesionS: Ost RPDA lesion is 50% stenosed. Post Atrio-1 lesion is 70% stenosed. Post Atrio-2 lesion is 95% stenosed. Ost 1st Mrg lesion is 80% stenosed. Major bifurcating OM branch. Prox Cx to Mid Cx lesion is 50% stenosed. Mid Cx to Dist Cx lesion is 90% stenosed. Prox LAD to Mid LAD lesion is 60% stenosed. Mid LAD lesion is 70% stenosed. Dist LAD lesion is 100% stenosed. Ost 1st Diag to 1st Diag lesion is 90% stenosed. Small moderate caliber major diagonal branch. There is hyperdynamic left ventricular systolic function. The left ventricular ejection fraction is greater than 65% by visual estimate. LV end diastolic pressure is normal. There is no mitral valve regurgitation.   Severe multivessel disease, possibly diabetic in nature.   The LAD itself is probably the least diseased vessel, however distally it is diffusely diseased and occludes. The major OM1 has a focal 70-80% stenosis, and the AV groove circumflex terminates with severe disease in multiple small bifurcations. There is bifurcation disease at the distal RCA into the RPDA and the RPAV with a more severe lesion in the proximal portion of the PADV that is a likely culprit lesion.   Given the extent of his CAD, the patient needs to be considered for bypass surgery is an option with diabetes and multivessel disease.  Unfortunately, targets not great with exception of the PDA, PL and OM1.  Diagonal and LAD are decent graft targets.   Plan for now will be to admit the  patient to the stepdown/ICU unit for maintaining stability with IV nitroglycerin  and heparin .  We  will discuss with interventional colleagues and likely consult CT surgery morning. Restart IV nitroglycerin  for blood pressure control.  Add carvedilol  to his home dose of ACE inhibitor. High-dose statin and aspirin .   If he does go for bypass surgery, he would need to be on Plavix  or Brilinta  on discharge given this year extent of his disease.   EKG:  EKG is personally reviewed. 02/27/2023: Sinus rhythm. Rate 72 bpm. Nonspecific T wave abnormalities.  Recent Labs: 11/11/2023: ALT 14 02/20/2024: Hemoglobin 10.4 04/13/2024: BUN 25; Creatinine, Ser 2.46; Potassium 5.0; Sodium 136   Recent Lipid Panel    Component Value Date/Time   CHOL 98 (L) 02/10/2024 0842   TRIG 115 02/10/2024 0842   HDL 32 (L) 02/10/2024 0842   CHOLHDL 3.1 02/10/2024 0842   CHOLHDL 4 12/10/2022 0935   VLDL 20.4 12/10/2022 0935   LDLCALC 45 02/10/2024 0842    Physical Exam:    VS:  BP (!) 160/82 (BP Location: Left Arm, Patient Position: Sitting, Cuff Size: Large)   Pulse 88   Resp 17   Ht 5' 8 (1.727 m)   Wt 223 lb (101.2 kg)   SpO2 99%   BMI 33.91 kg/m  , BMI Body mass index is 33.91 kg/m. GENERAL:  Well appearing HEENT: Pupils equal round and reactive, fundi not visualized, oral mucosa unremarkable NECK:  No jugular venous distention, waveform within normal limits, carotid upstroke brisk and symmetric, no bruits, no thyromegaly LUNGS:  Clear to auscultation bilaterally HEART:  RRR.  PMI not displaced or sustained,S1 and S2 within normal limits, no S3, no S4, no clicks, no rubs, no murmurs ABD:  Flat, positive bowel sounds normal in frequency in pitch, no bruits, no rebound, no guarding, no midline pulsatile mass, no hepatomegaly, no splenomegaly EXT:  2 plus pulses throughout, no edema, no cyanosis, no clubbing SKIN:  No rashes, no nodules NEURO:  Cranial nerves II through XII grossly intact, motor grossly intact throughout PSYCH:  Cognitively intact, oriented to person place and  time   ASSESSMENT/PLAN:    Assessment & Plan # Atherosclerotic heart disease of native coronary artery with myocardial infarction # Hyperlipidemia:  Myocardial infarction requires beta-blocker therapy to reduce cardiac events and prevent heart failure. Current beta-blocker (carvedilol ) may cause fatigue, affecting adherence. - Switch carvedilol  to nebivolol  (Bystolic ) to reduce fatigue and improve adherence. - Continue lisinopril  for cardiac protection. - Continue aspirin  and atorvastatin   # Resistant hypertension Hypertension management is challenging due to medication side effects causing fatigue. Blood pressure readings are inconsistent, with recent reading of 160/82 mmHg. Current regimen includes carvedilol  and lisinopril , but adherence is inconsistent due to side effects.  Amlodipine  and hydralazine  were discontinued to increase the likelihood of him taking his medication consistently.  We had a frank discussion about the importance of taking medications as prescribed.   - Switch carvedilol  to nebivolol  (Bystolic ) to reduce fatigue. - Continue lisinopril  for blood pressure management. - Monitor blood pressure regularly at home. - Reassess blood pressure control in a couple of months.  # Chronic kidney disease IV:  Chronic kidney disease management is complicated by hypertension. Blood pressure control is crucial to slow progression. Current regimen includes lisinopril  and Farxiga , which aids in slowing kidney disease progression. - Continue lisinopril  for kidney protection. - Continue Farxiga  to slow progression of kidney disease. - BP control as above - Continue nephrology f/u  # Hyperlipidemia; Continue rosuvastatin.  Screening for Secondary Hypertension:     02/27/2023    9:25 AM  Causes  Drugs/Herbals Screened     - Comments tries to limit salt, one coffee daily, occasional EtOH, prior tobacco.  +vapes  Sleep Apnea Screened     - Comments snores.  + daytime somnolence   Thyroid  Disease Screened  Hyperaldosteronism Screened     - Comments check renin and aledosterone  Pheochromocytoma N/A  Cushing's Syndrome N/A  Hyperparathyroidism N/A  Coarctation of the Aorta Screened  Compliance Screened    Relevant Labs/Studies:    Latest Ref Rng & Units 04/13/2024    8:51 AM 02/20/2024   12:00 AM 12/20/2023   12:00 AM  Basic Labs  Sodium 135 - 145 mEq/L 136  138     138      Potassium 3.5 - 5.1 mEq/L 5.0  5.2     4.7      Creatinine 0.40 - 1.50 mg/dL 7.53  3.1     2.7         This result is from an external source.       Latest Ref Rng & Units 02/27/2023   10:41 AM 02/27/2023   10:14 AM  Thyroid    TSH uIU/mL CANCELED  1.220      Disposition:    FU with APP/PharmD in 1 month for the next 3 months.   FU with Verna Hamon C. Raford, MD, [facility] in 4 months.  Medication Adjustments/Labs and Tests Ordered: Current medicines are reviewed at length with the patient today.  Concerns regarding medicines are outlined above.   No orders of the defined types were placed in this encounter.  Meds ordered this encounter  Medications   Nebivolol  HCl 20 MG TABS    Sig: Take 1 tablet (20 mg total) by mouth daily.    Dispense:  90 tablet    Refill:  3    D/C CARVEDILOL      Signed, Annabella Raford, MD  06/29/2024 6:40 PM    [facility]

## 2024-07-09 ENCOUNTER — Other Ambulatory Visit: Payer: Self-pay | Admitting: Family Medicine

## 2024-07-09 DIAGNOSIS — E611 Iron deficiency: Secondary | ICD-10-CM

## 2024-07-11 ENCOUNTER — Encounter (HOSPITAL_BASED_OUTPATIENT_CLINIC_OR_DEPARTMENT_OTHER): Payer: Self-pay | Admitting: Emergency Medicine

## 2024-07-11 ENCOUNTER — Emergency Department (HOSPITAL_BASED_OUTPATIENT_CLINIC_OR_DEPARTMENT_OTHER)
Admission: EM | Admit: 2024-07-11 | Discharge: 2024-07-11 | Disposition: A | Discharge status: Home / Self Care | Attending: Emergency Medicine | Admitting: Emergency Medicine

## 2024-07-11 ENCOUNTER — Other Ambulatory Visit: Payer: Self-pay

## 2024-07-11 DIAGNOSIS — M25561 Pain in right knee: Secondary | ICD-10-CM | POA: Diagnosis present

## 2024-07-11 DIAGNOSIS — Z7982 Long term (current) use of aspirin: Secondary | ICD-10-CM | POA: Insufficient documentation

## 2024-07-11 DIAGNOSIS — M109 Gout, unspecified: Secondary | ICD-10-CM | POA: Diagnosis not present

## 2024-07-11 DIAGNOSIS — M10061 Idiopathic gout, right knee: Secondary | ICD-10-CM | POA: Diagnosis not present

## 2024-07-11 DIAGNOSIS — Z794 Long term (current) use of insulin: Secondary | ICD-10-CM | POA: Diagnosis not present

## 2024-07-11 MED ORDER — PREDNISONE 10 MG PO TABS: 20.0000 mg | ORAL_TABLET | Freq: Two times a day (BID) | ORAL | 0 refills | Status: DC

## 2024-07-11 MED ORDER — PREDNISONE 20 MG PO TABS
40.0000 mg | ORAL_TABLET | Freq: Once | ORAL | Status: AC
Start: 2024-07-11 — End: 2024-07-11
  Administered 2024-07-11: 40 mg via ORAL
  Filled 2024-07-11: qty 2

## 2024-07-11 MED ORDER — OXYCODONE-ACETAMINOPHEN 5-325 MG PO TABS: 1.0000 | ORAL_TABLET | Freq: Four times a day (QID) | ORAL | 0 refills | Status: AC | PRN

## 2024-07-11 MED ORDER — HYDROMORPHONE HCL 1 MG/ML IJ SOLN
2.0000 mg | Freq: Once | INTRAMUSCULAR | Status: AC
  Administered 2024-07-11: 2 mg via INTRAMUSCULAR
  Filled 2024-07-11: qty 2

## 2024-07-11 NOTE — Discharge Instructions (Signed)
Begin taking prednisone as prescribed. ? ?Begin taking Percocet as prescribed as needed for pain.   ? ?Follow-up with primary doctor if not improving in the next few days. ?

## 2024-07-11 NOTE — ED Notes (Signed)
RN provided AVS using Teachback Method. Patient verbalizes understanding of Discharge Instructions. Opportunity for Questioning and Answers were provided by RN. Patient Discharged from ED in wheelchair to home with family. 

## 2024-07-11 NOTE — ED Provider Notes (Signed)
 [place] [facility] Provider Note   CSN: [id number] Arrival date & time: 07/11/24  0320     Patient presents with: Knee Pain   Matthew Fisher is a 53 y.o. male.   Patient is a 53 year old male with history of gout presenting with complaints of severe right knee pain.  This started yesterday in the absence of any injury or trauma.  This feels similar to prior episodes of gout, only worse.  No fevers or chills.  He describes severe pain with any range of motion.  No alleviating factors.       Prior to Admission medications   Medication Sig Start Date End Date Taking? Authorizing Provider  acetaminophen  (TYLENOL ) 325 MG tablet Take by mouth. 09/14/19   [provider]  allopurinol  (ZYLOPRIM ) 300 MG tablet Take 1 tablet by mouth once daily 03/21/23   Cheryl Waddell HERO, PA-C  aspirin  81 MG chewable tablet Chew 1 tablet (81 mg total) by mouth daily. 10/20/17   Dunn, Bernardino HERO, PA-C  atorvastatin  (LIPITOR ) 80 MG tablet Take 1 tablet by mouth once daily 06/19/24   Almarie Waddell NOVAK, NP  blood glucose meter kit and supplies KIT Dispense based on patient and insurance preference. Use up to four times daily as directed. Please include lancets, test strips, control solution. 09/10/22   Almarie Waddell NOVAK, NP  Blood Pressure Monitoring (BLOOD PRESSURE CUFF) MISC Use to check blood pressure daily 07/22/23   Almarie Waddell NOVAK, NP  cholecalciferol 25 MCG (1000 UT) tablet Take by mouth. 01/25/21   [provider]  Continuous Glucose Sensor (FREESTYLE LIBRE 3 PLUS SENSOR) MISC Change sensor every 15 days, to check blood sugars continuously 06/22/24   Thapa, [place], MD  dapagliflozin  propanediol (FARXIGA ) 10 MG TABS tablet Take 1 tablet (10 mg total) by mouth daily. 04/15/24   Almarie Waddell NOVAK, NP  diclofenac  Sodium (VOLTAREN ) 1 % GEL Apply topically. 11/11/20   [provider]  esomeprazole  (NEXIUM ) 20 MG capsule Take 1 capsule (20 mg total) by mouth 2 (two) times  daily before a meal. 11/11/23   Almarie Waddell NOVAK, NP  ezetimibe  (ZETIA ) 10 MG tablet Take 1 tablet (10 mg total) by mouth daily. 12/19/22   Domenica Harlene LABOR, MD  FEROSUL 325 (65 Fe) MG tablet TAKE 1 TABLET BY MOUTH IN THE MORNING 07/09/24   Beck, Taylor B, NP  furosemide  (LASIX ) 40 MG tablet Take 1 tablet by mouth once daily 09/09/23   Domenica Harlene LABOR, MD  insulin  detemir (LEVEMIR  FLEXTOUCH) 100 UNIT/ML FlexPen Inject 10 Units into the skin at bedtime. Start with 10 units every night. Titrate up by 2 units every 3 days if morning/fasting glucose is higher than 130 consistently. Patient taking differently: Inject 10 Units into the skin at bedtime. Start with 15 units every night. Titrate up by 2 units every 3 days if morning/fasting glucose is higher than 130 consistently. 12/07/21   Almarie Waddell NOVAK, NP  insulin  glargine (LANTUS  SOLOSTAR) 100 UNIT/ML Solostar Pen Inject 15 Units into the skin daily. 06/22/24   Thapa, [place], MD  Insulin  Pen Needle (PEN NEEDLES) 32G X 4 MM MISC 1 each by Does not apply route daily. 12/07/21   Almarie Waddell NOVAK, NP  lisinopril  (ZESTRIL ) 40 MG tablet Take 1 tablet by mouth once daily 07/09/24   Beck, Taylor B, NP  Nebivolol  HCl 20 MG TABS Take 1 tablet (20 mg total) by mouth daily. 06/29/24   Raford Riggs, MD  nitroGLYCERIN  (NITROSTAT )  0.4 MG SL tablet Place 1 tablet (0.4 mg total) under the tongue every 5 (five) minutes as needed for chest pain. 03/12/22 08/02/24  Almarie Waddell NOVAK, NP  sitaGLIPtin  (JANUVIA ) 25 MG tablet Take 1 tablet (25 mg total) by mouth daily. 06/22/24 06/22/25  Thapa, [place], MD  tamsulosin  (FLOMAX ) 0.4 MG CAPS capsule Take 1 capsule (0.4 mg total) by mouth daily after supper. 02/25/24   Almarie Waddell NOVAK, NP    Allergies: Patient has no known allergies.    Review of Systems  All other systems reviewed and are negative.   Updated Vital Signs BP (!) 160/83   Pulse 90   Temp 98 F (36.7 C)   Resp 20   Ht 5' 8 (1.727 m)   Wt 101.2 kg   SpO2 100%   BMI 33.91 kg/m    Physical Exam Vitals and nursing note reviewed.  Constitutional:      Appearance: Normal appearance.  Pulmonary:     Effort: Pulmonary effort is normal.  Musculoskeletal:     Comments: The right knee appears grossly normal.  There is no palpable large effusion.  It is slightly warm to the touch.  There is severe pain with any range of motion.  Skin:    General: Skin is warm and dry.  Neurological:     Mental Status: He is alert.     (all labs ordered are listed, but only abnormal results are displayed) Labs Reviewed - No data to display  EKG: None  Radiology: No results found.   Procedures   Medications Ordered in the ED  HYDROmorphone  (DILAUDID ) injection 2 mg (2 mg Intramuscular Given 07/11/24 0346)  predniSONE  (DELTASONE ) tablet 40 mg (40 mg Oral Given 07/11/24 0354)                                    Medical Decision Making Risk Prescription drug management.   Patient presenting with severe right knee pain due to a flareup of gout.  He arrives with stable vital signs and is afebrile.  Patient has been given IM Dilaudid  along with 40 of prednisone  and seems to be feeling better.  He will be discharged with prednisone  and Percocet.  Patient is to follow-up with primary doctor if not improving.  I suspected a septic joint, but highly doubt this possibility.     Final diagnoses:  None    ED Discharge Orders     None          Geroldine Berg, MD 07/11/24 [phone]

## 2024-07-11 NOTE — ED Triage Notes (Signed)
 Pt c/o right knee pain since yesterday, thinks that it is gout.

## 2024-07-21 DIAGNOSIS — N184 Chronic kidney disease, stage 4 (severe): Secondary | ICD-10-CM | POA: Diagnosis not present

## 2024-07-21 DIAGNOSIS — M109 Gout, unspecified: Secondary | ICD-10-CM | POA: Diagnosis not present

## 2024-07-21 DIAGNOSIS — E1129 Type 2 diabetes mellitus with other diabetic kidney complication: Secondary | ICD-10-CM | POA: Diagnosis not present

## 2024-07-21 DIAGNOSIS — N2581 Secondary hyperparathyroidism of renal origin: Secondary | ICD-10-CM | POA: Diagnosis not present

## 2024-07-21 DIAGNOSIS — E1122 Type 2 diabetes mellitus with diabetic chronic kidney disease: Secondary | ICD-10-CM | POA: Diagnosis not present

## 2024-07-21 DIAGNOSIS — I129 Hypertensive chronic kidney disease with stage 1 through stage 4 chronic kidney disease, or unspecified chronic kidney disease: Secondary | ICD-10-CM | POA: Diagnosis not present

## 2024-07-21 DIAGNOSIS — R809 Proteinuria, unspecified: Secondary | ICD-10-CM | POA: Diagnosis not present

## 2024-07-22 ENCOUNTER — Telehealth: Payer: Self-pay

## 2024-07-22 NOTE — Telephone Encounter (Signed)
 Patient was identified as falling into the [place] Measure - Diabetes.   Patient was: Requires a call back at a later time. Voicemail not available. Mychart message sent to pt requesting to schedule appointment.

## 2024-07-31 ENCOUNTER — Telehealth: Payer: Self-pay | Admitting: Pharmacist

## 2024-07-31 ENCOUNTER — Telehealth: Payer: Self-pay

## 2024-07-31 NOTE — Telephone Encounter (Signed)
 Received fax from [facility] and [employer] program that patient was [place]ally [place] for [place] program for 2026. Patient needs to call [place] and Provide update consent for the program. He is receiving Farxiga  from [place] [employer] [place].  Provided number for [place] to patient's wife. [phone]

## 2024-07-31 NOTE — Telephone Encounter (Signed)
 Also contacted [employer] patient assistance program to verify they have correct Januvia  dose of 25mg  daily. They do and will ship out next refill to patient.  He will be due to re-enroll. Program is sending him a new application for 2026. Will have Med Assist Team follow up to make sure application is completed.

## 2024-07-31 NOTE — Telephone Encounter (Signed)
 PAP: Patient assistance application for Farxiga  through [employer] ([place]) has been mailed to pt's home address on file. Provider portion of application will be faxed to provider's office. Patient spouse will consent for 2026 with az&me via telephone.

## 2024-08-03 NOTE — Telephone Encounter (Signed)
 PAP: Application for Marcelline Deist has been submitted to [employer] (AZ&[place]), via fax

## 2024-08-05 ENCOUNTER — Encounter: Payer: Self-pay | Admitting: Endocrinology

## 2024-08-05 ENCOUNTER — Ambulatory Visit: Admitting: Endocrinology

## 2024-08-05 ENCOUNTER — Ambulatory Visit: Payer: Self-pay | Admitting: Endocrinology

## 2024-08-05 VITALS — BP 152/80 | HR 74 | Resp 20 | Ht 68.0 in | Wt 220.0 lb

## 2024-08-05 DIAGNOSIS — Z794 Long term (current) use of insulin: Secondary | ICD-10-CM | POA: Diagnosis not present

## 2024-08-05 DIAGNOSIS — E1169 Type 2 diabetes mellitus with other specified complication: Secondary | ICD-10-CM | POA: Diagnosis not present

## 2024-08-05 LAB — POCT GLYCOSYLATED HEMOGLOBIN (HGB A1C): Hemoglobin A1C: 7.7 % — AB (ref 4.0–5.6)

## 2024-08-05 NOTE — Progress Notes (Signed)
 Outpatient Endocrinology Note Matthew Baumgardner, MD   Patient's Name: Matthew Fisher    DOB: January 05, 1971    MRN: [id number]                                                    REASON OF VISIT: Follow-up for type 2 diabetes mellitus  REFERRING PROVIDER: Raford Riggs, MD  PCP: Almarie Waddell NOVAK, NP  HISTORY OF PRESENT ILLNESS:   Matthew Fisher is a 53 y.o. old male with past medical history listed below, is here for follow-up for type 2 diabetes mellitus.   Pertinent Diabetes History: Patient is referred to endocrinology for evaluation and management of uncontrolled type 2 diabetes mellitus.  Initial consult on June 22, 2024.  Patient was diagnosed with type 2 diabetes mellitus in ~ 2010.  He has been on insulin  therapy from 2013.  He has uncontrolled diabetes mellitus with hemoglobin A1c mostly in the range of 8 to 9% range.  Hemoglobin A1c was 8.1% in July 2025.  History of DKA or diabetes related hospitalizations: none  Previous diabetes education: ?   Family h/o diabetes mellitus: mother had type 2 Diabetes.   No personal history of pancreatitis and / or family history of medullary thyroid  carcinoma or MEN 2B syndrome.   Chronic Diabetes Complications : Retinopathy: yes, proliferative DM with macular edema. Last ophthalmology exam was done on every 3-4 months, following with ophthalmology regularly.  Nephropathy: CKD IV, on ACE/ARB / lisinopril  ( on hold) , farxiga , following with nephrology.  Peripheral neuropathy: yes Coronary artery disease: yes Stroke: no  Relevant comorbidities and cardiovascular risk factors: Obesity: yes Body mass index is 33.45 kg/m.  Hypertension: Yes  Hyperlipidemia : Yes, on statin   Current / Home Diabetic regimen includes:  Lantus  14 units at bedtime.  Not fully compliant taking 2-3 times a week. Farxiga  10 mg daily. Januvia  25 mg daily.  Prior diabetic medications: Metfromin stopped due to CKD  Glycemic data:   Patient has  freestyle libre 3+ CGM, not using for the last week, restarted today.  Not much glucose data to review.  Blood sugar in the afternoon today in 200s.  Hypoglycemia: Patient has no hypoglycemic episodes. Patient has hypoglycemia awareness.  Factors modifying glucose control: 1.  Diabetic diet assessment: 3 meals a day.  Usually diet soda.  2.  Staying active or exercising:   3.  Medication compliance: compliant all of the time.  Interval history  Hemoglobin A1c improved to 7.7% today.  Diabetes regimen is reviewed and noted above.  Patient reports he is not fully compliant with Lantus /insulin  taking about 2-3 times a week.  He reports he also drinks beer on some of the weekends, has noticed normal blood sugar without taking insulin  and other diabetic medication.  Discussed about limiting or quitting alcohol intake.  Discussed that it is okay not to take insulin  when having low blood sugar or normal blood sugar and not eating well however advised to take insulin  every day without missing around same time.  No other complaints today.  REVIEW OF SYSTEMS As per history of present illness.   PAST MEDICAL HISTORY: Past Medical History:  Diagnosis Date   Arthritis    CAD (coronary artery disease)    a. NSTEMI 10/2017 s/p multivessel PCI of the OM1, PDA, PLA ostium and mid PLA.  CKD (chronic kidney disease), stage II    GERD (gastroesophageal reflux disease)    Gout    Hypertension    Insulin  dependent diabetes mellitus     PAST SURGICAL HISTORY: Past Surgical History:  Procedure Laterality Date   CORONARY STENT INTERVENTION N/A 10/17/2017   Procedure: CORONARY STENT INTERVENTION;  Surgeon: Dann Candyce RAMAN, MD;  Location: [facility] INVASIVE CV LAB;  Service: Cardiovascular;  Laterality: N/A;   EYE SURGERY     EYE SURGERY Right 08/2022   laser surgery   FRACTURE SURGERY     right ring finger   KNEE ARTHROSCOPY WITH LATERAL MENISECTOMY Left 01/22/2013   Procedure: LEFT KNEE ARTHROSCOPY  DEBRIDEMENT CHONDROPLASTY, LATERAL RELEASE AND partial medial meniscectomy;  Surgeon: Reyes JAYSON Billing, MD;  Location: [facility] ORS;  Service: Orthopedics;  Laterality: Left;   KNEE SURGERY     removed bone   LEFT HEART CATH N/A 10/17/2017   Procedure: Left Heart Cath;  Surgeon: Dann Candyce RAMAN, MD;  [facility] INVASIVE CV LAB;  Service: Cardiovascular;  Laterality: N/A;   LEFT HEART CATH AND CORONARY ANGIOGRAPHY N/A 10/15/2017   Procedure: LEFT HEART CATH AND CORONARY ANGIOGRAPHY;  Surgeon: Anner Alm ORN, MD;  Location: [facility] INVASIVE CV LAB;  Service: Cardiovascular;  Laterality: N/A;   SHOULDER SURGERY Bilateral     ALLERGIES: No Known Allergies  FAMILY HISTORY:  Family History  Problem Relation Age of Onset   Diabetes Mother    Cancer Father    Healthy Daughter    Healthy Daughter    Healthy Son     SOCIAL HISTORY: Social History   Socioeconomic History   Marital status: Married    Spouse name: Not on file   Number of children: 4   Years of education: Not on file   Highest education level: Not on file  Occupational History   Not on file  Tobacco Use   Smoking status: Former    Current packs/day: 0.00    Average packs/day: 0.5 packs/day for 20.0 years (10.0 ttl pk-yrs)    Types: Cigarettes, Cigars    Start date: 10/08/1982    Quit date: 10/08/2002    Years since quitting: 21.8    Passive exposure: Never   Smokeless tobacco: Never  Vaping Use   Vaping status: Every Day   Substances: Nicotine, Flavoring  Substance and Sexual Activity   Alcohol use: Yes    Comment: occasional   Drug use: No   Sexual activity: Yes  Other Topics Concern   Not on file  Social History Narrative   Not on file   Social Drivers of Health   Financial Resource Strain: Low Risk  (05/26/2024)   Overall Financial Resource Strain (CARDIA)    Difficulty of Paying Living Expenses: Not hard at all  Food Insecurity: No Food Insecurity (05/26/2024)   Hunger Vital Sign    Worried About  Running Out of Food in the Last Year: Never true    Ran Out of Food in the Last Year: Never true  Transportation Needs: No Transportation Needs (05/26/2024)   PRAPARE - Administrator, Civil Service (Medical): No    Lack of Transportation (Non-Medical): No  Physical Activity: Sufficiently Active (05/26/2024)   Exercise Vital Sign    Days of Exercise per Week: 4 days    Minutes of Exercise per Session: 120 min  Stress: No Stress Concern Present (05/26/2024)   [employer] of Occupational Health - Occupational Stress Questionnaire    Feeling of Stress:  Not at all  Social Connections: Moderately Isolated (05/26/2024)   Social Connection and Isolation Panel    Frequency of Communication with Friends and Family: More than three times a week    Frequency of Social Gatherings with Friends and Family: More than three times a week    Attends Religious Services: Never    Database Administrator or Organizations: No    Attends Banker Meetings: Never    Marital Status: Married    MEDICATIONS:  Current Outpatient Medications  Medication Sig Dispense Refill   acetaminophen  (TYLENOL ) 325 MG tablet Take by mouth.     allopurinol  (ZYLOPRIM ) 300 MG tablet Take 1 tablet by mouth once daily 30 tablet 0   aspirin  81 MG chewable tablet Chew 1 tablet (81 mg total) by mouth daily. 30 tablet 11   atorvastatin  (LIPITOR ) 80 MG tablet Take 1 tablet by mouth once daily 90 tablet 1   blood glucose meter kit and supplies KIT Dispense based on patient and insurance preference. Use up to four times daily as directed. Please include lancets, test strips, control solution. 1 each 0   Blood Pressure Monitoring (BLOOD PRESSURE CUFF) MISC Use to check blood pressure daily     cholecalciferol 25 MCG (1000 UT) tablet Take by mouth.     Continuous Glucose Sensor (FREESTYLE LIBRE 3 PLUS SENSOR) MISC Change sensor every 15 days, to check blood sugars continuously 6 each 3   dapagliflozin  propanediol  (FARXIGA ) 10 MG TABS tablet Take 1 tablet (10 mg total) by mouth daily. 90 tablet 1   diclofenac  Sodium (VOLTAREN ) 1 % GEL Apply topically.     esomeprazole  (NEXIUM ) 20 MG capsule Take 1 capsule (20 mg total) by mouth 2 (two) times daily before a meal. 60 capsule 3   ezetimibe  (ZETIA ) 10 MG tablet Take 1 tablet (10 mg total) by mouth daily. 90 tablet 3   FEROSUL 325 (65 Fe) MG tablet TAKE 1 TABLET BY MOUTH IN THE MORNING 90 tablet 0   furosemide  (LASIX ) 40 MG tablet Take 1 tablet by mouth once daily 90 tablet 0   insulin  detemir (LEVEMIR  FLEXTOUCH) 100 UNIT/ML FlexPen Inject 10 Units into the skin at bedtime. Start with 10 units every night. Titrate up by 2 units every 3 days if morning/fasting glucose is higher than 130 consistently. (Patient taking differently: Inject 10 Units into the skin at bedtime. Start with 15 units every night. Titrate up by 2 units every 3 days if morning/fasting glucose is higher than 130 consistently.) 15 mL 11   insulin  glargine (LANTUS  SOLOSTAR) 100 UNIT/ML Solostar Pen Inject 15 Units into the skin daily. 15 mL 4   Insulin  Pen Needle (PEN NEEDLES) 32G X 4 MM MISC 1 each by Does not apply route daily. 100 each 4   lisinopril  (ZESTRIL ) 40 MG tablet Take 1 tablet by mouth once daily 90 tablet 0   Nebivolol  HCl 20 MG TABS Take 1 tablet (20 mg total) by mouth daily. 90 tablet 3   nitroGLYCERIN  (NITROSTAT ) 0.4 MG SL tablet Place 1 tablet (0.4 mg total) under the tongue every 5 (five) minutes as needed for chest pain. 10 tablet 1   oxyCODONE -acetaminophen  (PERCOCET) 5-325 MG tablet Take 1-2 tablets by mouth every 6 (six) hours as needed. 20 tablet 0   predniSONE  (DELTASONE ) 10 MG tablet Take 2 tablets (20 mg total) by mouth 2 (two) times daily. 28 tablet 0   sitaGLIPtin  (JANUVIA ) 25 MG tablet Take 1 tablet (25 mg total)  by mouth daily. 90 tablet 3   tamsulosin  (FLOMAX ) 0.4 MG CAPS capsule Take 1 capsule (0.4 mg total) by mouth daily after supper. 90 capsule 0   No current  facility-administered medications for this visit.    PHYSICAL EXAM: Vitals:   08/05/24 1621  BP: (!) 152/80  Pulse: 74  Resp: 20  SpO2: 99%  Weight: 220 lb (99.8 kg)  Height: 5' 8 (1.727 m)    Body mass index is 33.45 kg/m.  Wt Readings from Last 3 Encounters:  08/05/24 220 lb (99.8 kg)  07/11/24 223 lb (101.2 kg)  06/29/24 223 lb (101.2 kg)    General: Well developed, well nourished male in no apparent distress.  HEENT: AT/[place], no external lesions.  Eyes: Conjunctiva clear and no icterus. Neck: Neck supple  Lungs: Respirations not labored Neurologic: Alert, oriented, normal speech Extremities / Skin: Dry.   Psychiatric: Does not appear depressed or anxious  Diabetic Foot Exam - Simple   No data filed    LABS Reviewed Lab Results  Component Value Date   HGBA1C 7.7 (A) 08/05/2024   HGBA1C 8.1 (H) 04/13/2024   HGBA1C 7.9 12/20/2023   No results found for: FRUCTOSAMINE Lab Results  Component Value Date   CHOL 98 (L) 02/10/2024   HDL 32 (L) 02/10/2024   LDLCALC 45 02/10/2024   TRIG 115 02/10/2024   CHOLHDL 3.1 02/10/2024   Lab Results  Component Value Date   MICRALBCREAT 1,541 01/04/2024   MICRALBCREAT 2,148 10/27/2022   Lab Results  Component Value Date   CREATININE 2.46 (H) 04/13/2024   Lab Results  Component Value Date   GFR 28.95 (L) 04/13/2024    ASSESSMENT / PLAN  1. Type 2 diabetes mellitus with other specified complication, with long-term current use of insulin  (HCC)     Diabetes Mellitus type 2, complicated by proliferative diabetic retinopathy, neuropathy, coronary artery disease and CKD. - Diabetic status / severity: Uncontrolled.  Slowly improving.  Lab Results  Component Value Date   HGBA1C 7.7 (A) 08/05/2024    - Hemoglobin A1c goal : <6.5%  Discussed about type 2 diabetes mellitus and potential chronic diabetic complications.  Discussed about importance of controlling blood sugar to prevent and to prevent worsening of  diabetic complications.  He has limited options for antidiabetic medication due to CKD 4 and proliferative diabetic retinopathy.  He has proliferative type retinopathy, following regularly with retinal specialist.  I will avoid GLP-1 receptor agonist at this time until cleared by lillard by ophthalmology.  Patient is asked to talk with retina specialist/ophthalmology when he has follow-up if he can take GLP-1 receptor agonist medication for example Ozempic.   - Medications: See below  I) continue Lantus  14 units daily.  Asked to take around same time every day.  Discussed about compliance with it. II) continue Januvia  25 mg daily.  Renally dosed. III) continue Farxiga  10 mg daily.  No glucose data available to review.  - Home glucose testing: Continue CGM, he has freestyle libre 3 sensor.  Check blood sugar as needed.  - Discussed/ Gave Hypoglycemia treatment plan.  # Consult : Recommended diabetic educator/dietitian visit, patient declined.  # Annual urine for microalbuminuria/ creatinine ratio, + microalbuminuria currently, CKD 4.  Following with nephrology.  Used to be on lisinopril , was discontinued previously.  Currently on Farxiga . Last  Lab Results  Component Value Date   MICRALBCREAT 1,541 01/04/2024    # Foot check nightly / neuropathy.  # He has proliferative diabetic retinopathy, regular  follow-up with retinal specialist and ophthalmology.   - Diet: Make healthy diabetic food choices, discussed in detail about diet plan, portion control, limiting carbohydrates and avoiding frequent snacks. - Life style / activity / exercise: Discussed.  2. Blood pressure  -  BP Readings from Last 1 Encounters:  08/05/24 (!) 152/80    - Control is not in target. Asymptomatic.  Did not blood pressure medicine today.  Advised for compliance. - No change in current plans.  3. Lipid status / Hyperlipidemia - Last  Lab Results  Component Value Date   LDLCALC 45 02/10/2024   -  Continue atorvastatin  80 mg daily.  Managed by PCP.  Diagnoses and all orders for this visit:  Type 2 diabetes mellitus with other specified complication, with long-term current use of insulin  (HCC) -     POCT glycosylated hemoglobin (Hb A1C)     DISPOSITION Follow up in clinic in 3 months suggested.   All questions answered and patient verbalized understanding of the plan.  Brittlyn Cloe, MD [facility] Endocrinology [facility] [address] [place], [address] [place], [place] [address] Phone # [phone]  At least part of this note was generated using voice recognition software. Inadvertent word errors may have occurred, which were not recognized during the proofreading process.

## 2024-08-10 ENCOUNTER — Encounter: Payer: Self-pay | Admitting: Family Medicine

## 2024-09-27 ENCOUNTER — Other Ambulatory Visit: Payer: Self-pay | Admitting: Family Medicine

## 2024-09-27 DIAGNOSIS — E611 Iron deficiency: Secondary | ICD-10-CM

## 2024-09-30 ENCOUNTER — Ambulatory Visit (HOSPITAL_BASED_OUTPATIENT_CLINIC_OR_DEPARTMENT_OTHER): Admitting: Family

## 2024-09-30 ENCOUNTER — Encounter (HOSPITAL_BASED_OUTPATIENT_CLINIC_OR_DEPARTMENT_OTHER): Payer: Self-pay | Admitting: Family

## 2024-09-30 VITALS — BP 140/82 | HR 81 | Ht 68.0 in | Wt 225.8 lb

## 2024-09-30 DIAGNOSIS — I1A0 Resistant hypertension: Secondary | ICD-10-CM | POA: Diagnosis not present

## 2024-09-30 DIAGNOSIS — I2510 Atherosclerotic heart disease of native coronary artery without angina pectoris: Secondary | ICD-10-CM | POA: Diagnosis not present

## 2024-09-30 DIAGNOSIS — E782 Mixed hyperlipidemia: Secondary | ICD-10-CM

## 2024-09-30 NOTE — Patient Instructions (Addendum)
 Medication Instructions:   Check your pill bottles at home Furosemide  (Lasix ). We suspect you are not taking any more, but if you are please let us  know. If you are not taking, you do not need to resume.    Follow-Up: Please follow up in 3 months in [facility] with Dr. Raford, Reche Finder, NP or Allean Mink PharmD    Special Instructions:    Bring pill bottles and blood pressure cuff to next office visit.   Check BP at home once per day and we will call you in one week to see what your readings are.  Tips to Measure your Blood Pressure Correctly  Here's what you can do to ensure a correct reading:  Don't drink a caffeinated beverage or smoke during the 30 minutes before the test.  Sit quietly for five minutes before the test begins.  During the measurement, sit in a chair with your feet on the floor and your arm supported so your elbow is at about heart level.  The inflatable part of the cuff should completely cover at least 80% of your upper arm, and the cuff should be placed on bare skin, not over a shirt.  Don't talk during the measurement.  Have your blood pressure measured twice, with a brief break in between. If the readings are different by 5 points or more, have it done a third time.  Blood pressure categories  Blood pressure category SYSTOLIC (upper number)  DIASTOLIC (lower number)  Normal Less than 120 mm Hg and Less than 80 mm Hg  Elevated 120-129 mm Hg and Less than 80 mm Hg  High blood pressure: Stage 1 hypertension 130-139 mm Hg or 80-89 mm Hg  High blood pressure: Stage 2 hypertension 140 mm Hg or higher or 90 mm Hg or higher  Hypertensive crisis (consult your doctor immediately) Higher than 180 mm Hg and/or Higher than 120 mm Hg  Source: American Heart Association and American Stroke Association. For more on getting your blood pressure under control, buy Controlling Your Blood Pressure, a Special Health Report from [facility].   Blood  Pressure Log   Date   Time  Blood Pressure  Position  Example: Nov 1 9 AM 124/78 sitting

## 2024-09-30 NOTE — Progress Notes (Signed)
 "  Advanced Hypertension Clinic Assessment:    Date:  09/30/2024   ID:  Dream Nodal, DOB 11-Mar-1971, MRN [id number]  PCP:  Almarie Waddell NOVAK, NP  Cardiologist:  Lamar Fitch, MD  Nephrologist:  Referring MD: Almarie Waddell NOVAK, NP   CC: Hypertension  History of Present Illness:    Matthew Fisher is a 53 y.o. male with a hx of hypertension for CAD with NSTEMI in 10/2017 s/p PCI of OM1, PDA, PLA ostium, mid PLA, GERD, CKD 3B, diabetes, prior tobacco use, gout.   Previously been on regimen of amlodipine  10 mg daily, carvedilol  25 mg twice daily, lisinopril  40 mg daily.  Hydralazine  later added.  Compliance has been an issue.  He previously has voiced concerns regarding number of pills as well as fatigue and dizziness which he attributed to his antihypertensive regimen.   At visit 06/29/24 with Dr. Raford Carvedilol  was changed to Nebivolol  20mg  daily to improve BP and fatigue.  Lisinopril  40 mg daily was continued.  Hydralazine , amlodipine  previously discontinued to increase medication adherence.  Presents today for follow-up with his wife.  Reports occasional midsternal chest pain. Which goes away on its own. It occurs at random and feels likea  cramp. Has never had to take his nitroglycerin .  Reassurance provided atypical for angina. Reports dyspnea with more than usual activity which is stable from previous. No dyspnea with usual activity or at rest. No orthopnea. Isolated of PND some time age per his report. Left ankle will swell on occasion, no appreciable edema on exam.Since changing from Carvedilol  to Nebivolol  reports energy level is somewhat improved.  He has a upper arm BP cuff at home but has not checked since last clinic visit.  Previous antihypertensives: Amlodipine  Hydralazine   Past Medical History:  Diagnosis Date   Arthritis    CAD (coronary artery disease)    a. NSTEMI 10/2017 s/p multivessel PCI of the OM1, PDA, PLA ostium and mid PLA.   CKD (chronic kidney  disease), stage II    GERD (gastroesophageal reflux disease)    Gout    Hypertension    Insulin  dependent diabetes mellitus     Past Surgical History:  Procedure Laterality Date   CORONARY STENT INTERVENTION N/A 10/17/2017   Procedure: CORONARY STENT INTERVENTION;  Surgeon: Dann Candyce RAMAN, MD;  Location: [facility] INVASIVE CV LAB;  Service: Cardiovascular;  Laterality: N/A;   EYE SURGERY     EYE SURGERY Right 08/2022   laser surgery   FRACTURE SURGERY     right ring finger   KNEE ARTHROSCOPY WITH LATERAL MENISECTOMY Left 01/22/2013   Procedure: LEFT KNEE ARTHROSCOPY DEBRIDEMENT CHONDROPLASTY, LATERAL RELEASE AND partial medial meniscectomy;  Surgeon: Reyes JAYSON Billing, MD;  Location: [facility] OR[facility];  Service: Orthopedics;  Laterality: Left;   KNEE SURGERY     removed bone   LEFT HEART CATH N/A 10/17/2017   Procedure: Left Heart Cath;  Surgeon: Dann Candyce RAMAN, MD;  Location: [facility] INVASIVE CV LAB;  Service: Cardiovascular;  Laterality: N/A;   LEFT HEART CATH AND CORONARY ANGIOGRAPHY N/A 10/15/2017   Procedure: LEFT HEART CATH AND CORONARY ANGIOGRAPHY;  Surgeon: Anner Alm ORN, MD;  Location: [facility] INVASIVE CV LAB;  Service: Cardiovascular;  Laterality: N/A;   SHOULDER SURGERY Bilateral     Current Medications: Active Medications[1]   Allergies:   Patient has no known allergies.   Social History   Socioeconomic History   Marital status: Married    Spouse name: Not on file   Number of children: 4  Years of education: Not on file   Highest education level: Not on file  Occupational History   Not on file  Tobacco Use   Smoking status: Former    Current packs/day: 0.00    Average packs/day: 0.5 packs/day for 20.0 years (10.0 ttl pk-yrs)    Types: Cigarettes, Cigars    Start date: 10/08/1982    Quit date: 10/08/2002    Years since quitting: 21.9    Passive exposure: Never   Smokeless tobacco: Never  Vaping Use   Vaping status: Every Day   Substances: Nicotine, Flavoring   Substance and Sexual Activity   Alcohol use: Yes    Comment: occasional   Drug use: No   Sexual activity: Yes  Other Topics Concern   Not on file  Social History Narrative   Not on file   Social Drivers of Health   Tobacco Use: Medium Risk (09/30/2024)   Patient History    Smoking Tobacco Use: Former    Smokeless Tobacco Use: Never    Passive Exposure: Never  Physicist, Medical Strain: Low Risk (05/26/2024)   Overall Financial Resource Strain (CARDIA)    Difficulty of Paying Living Expenses: Not hard at all  Food Insecurity: No Food Insecurity (05/26/2024)   Epic    Worried About Programme Researcher, Broadcasting/film/video in the Last Year: Never true    Ran Out of Food in the Last Year: Never true  Transportation Needs: No Transportation Needs (05/26/2024)   Epic    Lack of Transportation (Medical): No    Lack of Transportation (Non-Medical): No  Physical Activity: Sufficiently Active (05/26/2024)   Exercise Vital Sign    Days of Exercise per Week: 4 days    Minutes of Exercise per Session: 120 min  Stress: No Stress Concern Present (05/26/2024)   [employer] of Occupational Health - Occupational Stress Questionnaire    Feeling of Stress: Not at all  Social Connections: Moderately Isolated (05/26/2024)   Social Connection and Isolation Panel    Frequency of Communication with Friends and Family: More than three times a week    Frequency of Social Gatherings with Friends and Family: More than three times a week    Attends Religious Services: Never    Database Administrator or Organizations: No    Attends Banker Meetings: Never    Marital Status: Married  Depression (PHQ2-9): Low Risk (05/26/2024)   Depression (PHQ2-9)    PHQ-2 Score: 0  Alcohol Screen: Low Risk (05/26/2024)   Alcohol Screen    Last Alcohol Screening Score (AUDIT): 0  Housing: Unknown (05/26/2024)   Epic    Unable to Pay for Housing in the Last Year: No    Number of Times Moved in the Last Year: Not on file     Homeless in the Last Year: No  Utilities: Not At Risk (05/26/2024)   Epic    Threatened with loss of utilities: No  Health Literacy: Adequate Health Literacy (05/26/2024)   B1300 Health Literacy    Frequency of need for help with medical instructions: Never     Family History: The patient's family history includes Cancer in his father; Diabetes in his mother; Healthy in his daughter, daughter, and son.  ROS:   Please see the history of present illness.     All other systems reviewed and are negative.  EKGs/Labs/Other Studies Reviewed:    EKG Interpretation Date/Time:  Wednesday September 30 2024 10:44:27 EST Ventricular Rate:  82 PR Interval:  164  QRS Duration:  100 QT Interval:  380 QTC Calculation: 443 R Axis:   -2  Text Interpretation: Normal sinus rhythm Left ventricular hypertrophy Stable TWI V6, nonspecific T wave abnormality. Confirmed by Vannie Mora ([id number]) on 09/30/2024 11:20:20 AM    Cardiac Studies & Procedures   ______________________________________________________________________________________________ CARDIAC CATHETERIZATION  CARDIAC CATHETERIZATION 10/17/2017  Conclusion  Dist LAD lesion is 100% stenosed.  Mid LAD lesion is 70% stenosed.  Ost 1st Mrg lesion is 80% stenosed.  A drug-eluting stent was successfully placed using a STENT SYNERGY DES 2.75X12.  Post intervention, there is a 0% residual stenosis.  Ost RPDA lesion is 75% stenosed.  A drug-eluting stent was successfully placed using a STENT SYNERGY DES 3X16.  Post intervention, there is a 0% residual stenosis.  Post Atrio-2 lesion is 70% stenosed.  Post Atrio-3 lesion is 95% stenosed.  A drug-eluting stent was successfully placed using a STENT SYNERGY DES 2.5X12. A second stent, 2.25 x 12 Synergy was placed overlapping.  Post intervention, there is a 0% residual stenosis.  Kissing balloon angioplasty was done at the ostium of the PDA and the PLA.  The ostium of the  posterolateral artery was treated Using a BALLOON SAPPHIRE 2.5X12.  Post intervention, there is a 40% residual stenosis at the ostium of the PLA.  LV end diastolic pressure is normal.  There is no aortic valve stenosis.  A drug-eluting stent was successfully placed.  Successful PCI of the OM1, PDA, PLA ostium and mid PLA.  Continue dual antiplatelet therapy for at least one year along with aggressive secondary prevention.  Would consider indefinite clopidogrel  given the extent of his disease.  Findings Coronary Findings Diagnostic  Dominance: Right  Left Anterior Descending Prox LAD to Mid LAD lesion is 60% stenosed. The lesion is located at the bend. Mid LAD lesion is 70% stenosed. The lesion is located at the bend and eccentric. Dist LAD lesion is 100% stenosed. The lesion is chronically occluded. The vessel just seems to taper down and stop off prior to reaching the apex.  First Diagonal Branch Vessel is moderate in size. Ost 1st Diag to 1st Diag lesion is 90% stenosed.  Lateral First Diagonal Branch Vessel is small in size. There is severe disease in the vessel.  Second Diagonal Branch Vessel is small in size.  Third Diagonal Branch Vessel is small in size.  Left Circumflex Vessel is large. There is moderate diffuse disease throughout the vessel. There is severe focal disease in the vessel. Prox Cx to Mid Cx lesion is 50% stenosed. The lesion is located at the major branch. Mid Cx to Dist Cx lesion is 90% stenosed. The lesion is segmental. Diffuse, irregular  First Obtuse Marginal Branch Vessel is large in size. The distal circumflex terminates as a small caliber posterolateral branch. Prior to bifurcating, there is a small caliber side branch just after the lesion. Ost 1st Mrg lesion is 80% stenosed. The lesion is located proximal to the major branch, discrete and concentric.  Lateral First Obtuse Marginal Branch Vessel is moderate in size. There is moderate   diffuse disease in the vessel. This moderate caliber branch was off one proximal branchand then bifurcates into 2 small distal branches.  Second Obtuse Marginal Branch Vessel is small in size. There is moderate  diffuse disease in the vessel.  Right Coronary Artery  Acute Marginal Branch Vessel is small in size. There is moderate disease in the vessel.  Right Posterior Descending Artery Ost RPDA lesion is 75% stenosed. The  lesion is tubular and eccentric.  Right Posterior Atrioventricular Artery Vessel is moderate in size. Post Atrio-1 lesion is 70% stenosed. The lesion is tubular and eccentric. Post Atrio-2 lesion is 70% stenosed. Post Atrio-3 lesion is 95% stenosed. Quite likely The lesion is eccentric and irregular.  Intervention  Ost 1st Mrg lesion Stent Lesion crossed with guidewire using a WIRE ASAHI PROWATER 180CM. Pre-stent angioplasty was performed using a BALLOON SAPPHIRE 2.5X12. A drug-eluting stent was successfully placed using a STENT SYNERGY DES 2.75X12. Stent strut is well apposed. Post-stent angioplasty was performed using a BALLOON SAPPHIRE [place] 3.0X8. Post-Intervention Lesion Assessment There is a 0% residual stenosis post intervention.  Ost RPDA lesion Stent Lesion crossed with guidewire using a WIRE ASAHI PROWATER 180CM. Pre-stent angioplasty was not performed. A drug-eluting stent was successfully placed using a STENT SYNERGY DES 3X16. Stent strut is well apposed. Post-stent angioplasty was performed using a BALLOON SAPPHIRE [place] 3.0X8. PTCA of the PL was done with the 2.5 balloon before nad after PDA stent placement.  THen, final kissing balloon inflation was performed.  After the bifurcation treatment was performed, the second PLA stent was placed for the proximal edge dissection. Post-Intervention Lesion Assessment There is a 0% residual stenosis post intervention.  RPAV-1 lesion Angioplasty using a BALLOON SAPPHIRE 2.5X12. Post-Intervention Lesion  Assessment There is a 40% residual stenosis post intervention.  RPAV-2 lesion Stent Lesion crossed with guidewire using a WIRE ASAHI PROWATER 180CM. Pre-stent angioplasty was not performed. A drug-eluting stent was successfully placed. Stent strut is well apposed. Stent overlaps previously placed stent. Post-stent angioplasty was not performed. Post-Intervention Lesion Assessment The intervention was successful. Pre-interventional TIMI flow is 3. Post-intervention TIMI flow is 3. No complications occurred at this lesion. There is a 0% residual stenosis post intervention.  RPAV-3 lesion Stent Lesion crossed with guidewire using a WIRE HI TORQ [place] 190CM. Pre-stent angioplasty was performed using a BALLOON SAPPHIRE 2.5X12. A drug-eluting stent was successfully placed using a STENT SYNERGY DES 2.5X12. Stent strut is well apposed. Post-stent angioplasty was not performed. There was a proximal stent edge dissection.  This was treated with an overlapping stent, 2.25 x 12 Synergy. Post-Intervention Lesion Assessment There is a 0% residual stenosis post intervention.   CARDIAC CATHETERIZATION 10/15/2017  Conclusion Images from the original result were not included.   Distal RCA bifurcation lesionS: Ost RPDA lesion is 50% stenosed. Post Atrio-1 lesion is 70% stenosed. Post Atrio-2 lesion is 95% stenosed.  Ost 1st Mrg lesion is 80% stenosed. Major bifurcating OM branch.  Prox Cx to Mid Cx lesion is 50% stenosed. Mid Cx to Dist Cx lesion is 90% stenosed.  Prox LAD to Mid LAD lesion is 60% stenosed. Mid LAD lesion is 70% stenosed. Dist LAD lesion is 100% stenosed.  Ost 1st Diag to 1st Diag lesion is 90% stenosed. Small moderate caliber major diagonal branch.  There is hyperdynamic left ventricular systolic function. The left ventricular ejection fraction is greater than 65% by visual estimate.  LV end diastolic pressure is normal.  There is no mitral valve regurgitation.  Severe multivessel  disease, possibly diabetic in nature. The LAD itself is probably the least diseased vessel, however distally it is diffusely diseased and occludes. The major OM1 has a focal 70-80% stenosis, and the AV groove circumflex terminates with severe disease in multiple small bifurcations. There is bifurcation disease at the distal RCA into the RPDA and the RPAV with a more severe lesion in the proximal portion of the PADV that is a likely  culprit lesion.  Given the extent of his CAD, the patient needs to be considered for bypass surgery is an option with diabetes and multivessel disease.  Unfortunately, targets not great with exception of the PDA, PL and OM1.  Diagonal and LAD are decent graft targets.  Plan for now will be to admit the patient to the stepdown/ICU unit for maintaining stability with IV nitroglycerin  and heparin .  We will discuss with interventional colleagues and likely consult CT surgery morning. Restart IV nitroglycerin  for blood pressure control.  Add carvedilol  to his home dose of ACE inhibitor. High-dose statin and aspirin .  If he does go for bypass surgery, he would need to be on Plavix  or Brilinta  on discharge given this year extent of his disease.   Alm Clay, M.D., M.S. Interventional Cardiologist  Pager # [phone] Phone # [phone] [address] [place], [place] [address]  Findings Coronary Findings Diagnostic  Dominance: Right  Left Anterior Descending Prox LAD to Mid LAD lesion is 60% stenosed. The lesion is located at the bend. Mid LAD lesion is 70% stenosed. The lesion is located at the bend and eccentric. Dist LAD lesion is 100% stenosed. The lesion is chronically occluded. The vessel just seems to taper down and stop off prior to reaching the apex.  First Diagonal Branch Vessel is moderate in size. Ost 1st Diag to 1st Diag lesion is 90% stenosed.  Lateral First Diagonal Branch Vessel is small in size. There is severe disease in the  vessel.  Second Diagonal Branch Vessel is small in size.  Third Diagonal Branch Vessel is small in size.  Left Circumflex Vessel is large. There is moderate diffuse disease throughout the vessel. There is severe focal disease in the vessel. Prox Cx to Mid Cx lesion is 50% stenosed. The lesion is located at the major branch. Mid Cx to Dist Cx lesion is 90% stenosed. The lesion is segmental. Diffuse, irregular  First Obtuse Marginal Branch Vessel is large in size. The distal circumflex terminates as a small caliber posterolateral branch. Prior to bifurcating, there is a small caliber side branch just after the lesion. Ost 1st Mrg lesion is 80% stenosed. The lesion is located proximal to the major branch, discrete and concentric.  Lateral First Obtuse Marginal Branch Vessel is moderate in size. There is moderate  diffuse disease in the vessel. This moderate caliber branch was off one proximal branchand then bifurcates into 2 small distal branches.  Second Obtuse Marginal Branch Vessel is small in size. There is moderate  diffuse disease in the vessel.  Right Coronary Artery  Acute Marginal Branch Vessel is small in size. There is moderate disease in the vessel.  Right Posterior Descending Artery Ost RPDA lesion is 50% stenosed. The lesion is tubular and eccentric.  Right Posterior Atrioventricular Artery Vessel is moderate in size. Post Atrio-1 lesion is 70% stenosed. The lesion is tubular and eccentric. Post Atrio-2 lesion is 95% stenosed. Quite likely The lesion is eccentric and irregular.  Intervention  No interventions have been documented.   STRESS TESTS  MYOCARDIAL PERFUSION IMAGING 01/16/2024  Interpretation Summary   The study is normal. The study is low risk.   No ST deviation was noted.   LV perfusion is normal. There is no evidence of ischemia. There is no evidence of infarction.   Left ventricular function is normal. Nuclear stress EF: 55%. The left  ventricular ejection fraction is normal (55-65%). End diastolic cavity size is normal. End systolic cavity size is normal.  Prior study available for comparison from 01/16/2022. No changes compared to prior study.   ECHOCARDIOGRAM  ECHOCARDIOGRAM COMPLETE 01/22/2022  Narrative ECHOCARDIOGRAM REPORT    Patient Name:   [facility] Date of Exam: 01/22/2022 Medical Rec #:  [id number]       Height:       68.0 in Accession #:    [id number]      Weight:       217.0 lb Date of Birth:  June 20, 1971      BSA:          2.116 m Patient Age:    50 years        BP:           151/76 mmHg Patient Gender: M               HR:           73 bpm. Exam Location:  Inpatient  Procedure: 2D Echo, Cardiac Doppler, Color Doppler and Strain Analysis  Indications:    Dyspnea  History:        Patient has prior history of Echocardiogram examinations, most recent 09/11/2019. CAD and Previous Myocardial Infarction; Risk Factors:Hypertension, Diabetes and Current Smoker.  Sonographer:    Vernell Hammersmith RDCS Referring Phys: [id number] ROBERT J KRASOWSKI  IMPRESSIONS   1. Left ventricular ejection fraction, by estimation, is 55 to 60%. The left ventricle has normal function. The left ventricle has no regional wall motion abnormalities. There is mild concentric left ventricular hypertrophy. Left ventricular diastolic parameters are consistent with Grade I diastolic dysfunction (impaired relaxation). 2. Right ventricular systolic function is normal. The right ventricular size is normal. There is normal pulmonary artery systolic pressure. 3. Left atrial size was moderately dilated. 4. The mitral valve is normal in structure. Trivial mitral valve regurgitation. No evidence of mitral stenosis. 5. The aortic valve is tricuspid. Aortic valve regurgitation is not visualized. No aortic stenosis is present. 6. The inferior vena cava is normal in size with greater than 50% respiratory variability, suggesting right atrial pressure  of 3 mmHg.  FINDINGS Left Ventricle: Left ventricular ejection fraction, by estimation, is 55 to 60%. The left ventricle has normal function. The left ventricle has no regional wall motion abnormalities. The left ventricular internal cavity size was normal in size. There is mild concentric left ventricular hypertrophy. Left ventricular diastolic parameters are consistent with Grade I diastolic dysfunction (impaired relaxation). Indeterminate filling pressures.  Right Ventricle: The right ventricular size is normal. No increase in right ventricular wall thickness. Right ventricular systolic function is normal. There is normal pulmonary artery systolic pressure. The tricuspid regurgitant velocity is 1.90 m/s, and with an assumed right atrial pressure of 3 mmHg, the estimated right ventricular systolic pressure is 17.4 mmHg.  Left Atrium: Left atrial size was moderately dilated.  Right Atrium: Right atrial size was normal in size.  Pericardium: There is no evidence of pericardial effusion.  Mitral Valve: The mitral valve is normal in structure. Trivial mitral valve regurgitation. No evidence of mitral valve stenosis.  Tricuspid Valve: The tricuspid valve is normal in structure. Tricuspid valve regurgitation is trivial. No evidence of tricuspid stenosis.  Aortic Valve: The aortic valve is tricuspid. Aortic valve regurgitation is not visualized. No aortic stenosis is present. Aortic valve mean gradient measures 3.0 mmHg. Aortic valve peak gradient measures 6.2 mmHg. Aortic valve area, by VTI measures 2.28 cm.  Pulmonic Valve: The pulmonic valve was normal in structure. Pulmonic valve regurgitation is not visualized. No evidence of  pulmonic stenosis.  Aorta: The aortic root and ascending aorta are structurally normal, with no evidence of dilitation and the aortic arch was not well visualized.  Venous: A normal flow pattern is recorded from the right upper pulmonary vein. The inferior vena cava  is normal in size with greater than 50% respiratory variability, suggesting right atrial pressure of 3 mmHg.  IAS/Shunts: No atrial level shunt detected by color flow Doppler.   LEFT VENTRICLE PLAX 2D LVIDd:         5.10 cm   Diastology LVIDs:         3.30 cm   LV e' medial:    6.42 cm/s LV PW:         1.50 cm   LV E/e' medial:  11.8 LV IVS:        1.30 cm   LV e' lateral:   6.64 cm/s LVOT diam:     2.00 cm   LV E/e' lateral: 11.4 LV SV:         56 LV SV Index:   26 LVOT Area:     3.14 cm   RIGHT VENTRICLE RV Basal diam:  3.20 cm RV S prime:     13.40 cm/s TAPSE (M-mode): 2.5 cm  LEFT ATRIUM           Index        RIGHT ATRIUM           Index LA diam:      4.40 cm 2.08 cm/m   RA Area:     17.70 cm LA Vol (A2C): 61.2 ml 28.92 ml/m  RA Volume:   47.50 ml  22.45 ml/m LA Vol (A4C): 96.1 ml 45.42 ml/m AORTIC VALVE AV Area (Vmax):    2.23 cm AV Area (Vmean):   2.18 cm AV Area (VTI):     2.28 cm AV Vmax:           124.00 cm/s AV Vmean:          88.300 cm/s AV VTI:            0.244 m AV Peak Grad:      6.2 mmHg AV Mean Grad:      3.0 mmHg LVOT Vmax:         88.10 cm/s LVOT Vmean:        61.300 cm/s LVOT VTI:          0.177 m LVOT/AV VTI ratio: 0.73  AORTA Ao Root diam: 3.10 cm Ao Asc diam:  3.00 cm  MITRAL VALVE               TRICUSPID VALVE MV Area (PHT): 3.21 cm    TR Peak grad:   14.4 mmHg MV Decel Time: 236 msec    TR Vmax:        190.00 cm/s MV E velocity: 75.80 cm/s MV A velocity: 70.30 cm/s  SHUNTS MV E/A ratio:  1.08        Systemic VTI:  0.18 m Systemic Diam: 2.00 cm  Redell Leiter MD Electronically signed by Redell Leiter MD Signature Date/Time: 01/23/2022/12:45:03 PM    Final          ______________________________________________________________________________________________       Recent Labs: 11/11/2023: ALT 14 02/20/2024: Hemoglobin 10.4 04/13/2024: BUN 25; Creatinine, Ser 2.46; Potassium 5.0; Sodium 136   Recent Lipid Panel     Component Value Date/Time   CHOL 98 (L) 02/10/2024 0842   TRIG 115 02/10/2024 0842  HDL 32 (L) 02/10/2024 0842   CHOLHDL 3.1 02/10/2024 0842   CHOLHDL 4 12/10/2022 0935   VLDL 20.4 12/10/2022 0935   LDLCALC 45 02/10/2024 0842    Physical Exam:   VS:  BP (!) 140/82 (BP Location: Left Arm, Patient Position: Sitting, Cuff Size: Normal)   Pulse 81   Ht 5' 8 (1.727 m)   Wt 225 lb 12.8 oz (102.4 kg)   SpO2 99%   BMI 34.33 kg/m  , BMI Body mass index is 34.33 kg/m.  Vitals:   09/30/24 1041 09/30/24 1102  BP: (!) 158/86 (!) 140/82  Pulse: 81   Height: 5' 8 (1.727 m)   Weight: 225 lb 12.8 oz (102.4 kg)   SpO2: 99%   BMI (Calculated): 34.34     GENERAL:  Well appearing, overweight HEENT: Pupils equal round and reactive, fundi not visualized, oral mucosa unremarkable NECK:  No jugular venous distention, waveform within normal limits, carotid upstroke brisk and symmetric, no bruits, no thyromegaly LYMPHATICS:  No cervical adenopathy LUNGS:  Clear to auscultation bilaterally HEART:  RRR.  PMI not displaced or sustained,S1 and S2 within normal limits, no S3, no S4, no clicks, no rubs, no murmurs ABD:  Flat, positive bowel sounds normal in frequency in pitch, no bruits, no rebound, no guarding, no midline pulsatile mass, no hepatomegaly, no splenomegaly EXT:  2 plus pulses throughout, no edema, no cyanosis no clubbing SKIN:  No rashes no nodules NEURO:  Cranial nerves II through XII grossly intact, motor grossly intact throughout PSYCH:  Cognitively intact, oriented to person place and time   ASSESSMENT/PLAN:    Resistant HTN -initial BP in clinic 158/86 return page 140/82 without intervention.  He did not take his morning Nebivolol  yet.  Reports improvement in energy level with transition from carvedilol  to Nebivolol .  He has not been checking her BP routinely at home.  He request to check BP at home prior to making medication changes.   Discussed the need to check BP daily in  order to make educated decisions regarding his antihypertensive regimen.  Amlodipine , hydralazine  previously discontinued to promote medication adherence.   Continue Nebivolol  20 mg a.m. and lisinopril  40 mg p.m.  Phone call in 1 week to check on BP.  If not routinely at goal less than 130/80 plan to increase Nebivolol  to 40 mg daily.  CAD/HLD, LDL goal less than 70-present occasional chest pain atypical for angina as fleeting and self resolves.  No indication for ischemic eval.  EKG today stable from previous.   GDMT includes aspirin  81 mg daily, atorvastatin  80 mg daily, Nebivolol  20 mg daily.  Recommend aiming for 150 minutes of moderate intensity activity per week and following a heart healthy diet.    CKD IV - Careful titration of diuretic and antihypertensive.  Follows with nephrology.   Screening for Secondary Hypertension:     02/27/2023    9:25 AM  Causes  Drugs/Herbals Screened     - Comments tries to limit salt, one coffee daily, occasional EtOH, prior tobacco.  +vapes  Sleep Apnea Screened     - Comments snores.  + daytime somnolence  Thyroid  Disease Screened  Hyperaldosteronism Screened     - Comments check renin and aledosterone  Pheochromocytoma N/A  Cushing's Syndrome N/A  Hyperparathyroidism N/A  Coarctation of the Aorta Screened  Compliance Screened    Relevant Labs/Studies:    Latest Ref Rng & Units 04/13/2024    8:51 AM 02/20/2024   12:00 AM 12/20/2023  12:00 AM  Basic Labs  Sodium 135 - 145 mEq/L 136  138     138      Potassium 3.5 - 5.1 mEq/L 5.0  5.2     4.7      Creatinine 0.40 - 1.50 mg/dL 7.53  3.1     2.7         This result is from an external source.       Latest Ref Rng & Units 02/27/2023   10:41 AM 02/27/2023   10:14 AM  Thyroid    TSH uIU/mL CANCELED  1.220        Latest Ref Rng & Units 02/27/2023   10:41 AM 02/27/2023   10:14 AM  Renin/Aldosterone   Aldosterone 0.0 - 30.0 ng/dL 1.3  1.1   Aldos/Renin Ratio  CANCELED  0.3               04/29/2023    9:04 AM  Renovascular   Renal Artery US  Completed Yes     Disposition:    FU with MD/APP/PharmD in 3 month    Medication Adjustments/Labs and Tests Ordered: Current medicines are reviewed at length with the patient today.  Concerns regarding medicines are outlined above.  Orders Placed This Encounter  Procedures   EKG 12-Lead   No orders of the defined types were placed in this encounter.    Signed, Reche GORMAN Finder, NP  09/30/2024 11:24 AM    [facility]     [1]  Current Meds  Medication Sig   acetaminophen  (TYLENOL ) 325 MG tablet Take by mouth.   allopurinol  (ZYLOPRIM ) 300 MG tablet Take 1 tablet by mouth once daily   aspirin  81 MG chewable tablet Chew 1 tablet (81 mg total) by mouth daily.   atorvastatin  (LIPITOR ) 80 MG tablet Take 1 tablet by mouth once daily   blood glucose meter kit and supplies KIT Dispense based on patient and insurance preference. Use up to four times daily as directed. Please include lancets, test strips, control solution.   Blood Pressure Monitoring (BLOOD PRESSURE CUFF) MISC Use to check blood pressure daily   cholecalciferol 25 MCG (1000 UT) tablet Take by mouth.   Continuous Glucose Sensor (FREESTYLE LIBRE 3 PLUS SENSOR) MISC Change sensor every 15 days, to check blood sugars continuously   dapagliflozin  propanediol (FARXIGA ) 10 MG TABS tablet Take 1 tablet (10 mg total) by mouth daily.   diclofenac  Sodium (VOLTAREN ) 1 % GEL Apply topically.   esomeprazole  (NEXIUM ) 20 MG capsule Take 1 capsule (20 mg total) by mouth 2 (two) times daily before a meal.   ezetimibe  (ZETIA ) 10 MG tablet Take 1 tablet (10 mg total) by mouth daily.   FEROSUL 325 (65 Fe) MG tablet TAKE 1 TABLET BY MOUTH IN THE MORNING   insulin  detemir (LEVEMIR  FLEXTOUCH) 100 UNIT/ML FlexPen Inject 10 Units into the skin at bedtime. Start with 10 units every night. Titrate up by 2 units every 3 days if morning/fasting glucose is higher than 130  consistently. (Patient taking differently: Inject 10 Units into the skin at bedtime. Start with 15 units every night. Titrate up by 2 units every 3 days if morning/fasting glucose is higher than 130 consistently.)   insulin  glargine (LANTUS  SOLOSTAR) 100 UNIT/ML Solostar Pen Inject 15 Units into the skin daily.   Insulin  Pen Needle (PEN NEEDLES) 32G X 4 MM MISC 1 each by Does not apply route daily.   lisinopril  (ZESTRIL ) 40 MG tablet Take 1 tablet by  mouth once daily   Nebivolol  HCl 20 MG TABS Take 1 tablet (20 mg total) by mouth daily.   nitroGLYCERIN  (NITROSTAT ) 0.4 MG SL tablet Place 1 tablet (0.4 mg total) under the tongue every 5 (five) minutes as needed for chest pain.   oxyCODONE -acetaminophen  (PERCOCET) 5-325 MG tablet Take 1-2 tablets by mouth every 6 (six) hours as needed.   sitaGLIPtin  (JANUVIA ) 25 MG tablet Take 1 tablet (25 mg total) by mouth daily.   tamsulosin  (FLOMAX ) 0.4 MG CAPS capsule Take 1 capsule (0.4 mg total) by mouth daily after supper.   "

## 2024-10-04 ENCOUNTER — Other Ambulatory Visit: Payer: Self-pay | Admitting: Family Medicine

## 2024-10-06 ENCOUNTER — Telehealth: Payer: Self-pay

## 2024-10-06 NOTE — Telephone Encounter (Signed)
 PAP: Patient assistance application for Januvia through [employer] has been mailed to pt's home address on file. Provider portion of application will be faxed to provider's office.

## 2024-10-07 ENCOUNTER — Telehealth (HOSPITAL_BASED_OUTPATIENT_CLINIC_OR_DEPARTMENT_OTHER): Payer: Self-pay | Admitting: Family

## 2024-10-07 NOTE — Telephone Encounter (Signed)
 At last OV was recommended to check BP daily in order to make educated decisions regarding his antihypertensive regimen.  Called, no answer. Left VM per DPR requesting they call back 10/09/24 with BP readings.    Demichael Traum S Brande Uncapher, NP

## 2024-10-07 NOTE — Telephone Encounter (Signed)
 Received Provider portion PAP application for Januvia  ([employer]).

## 2024-10-13 NOTE — Telephone Encounter (Signed)
 PAP: Patient assistance application for Farxiga  has been approved by PAP Companies: [employer] from 10/08/2024 to 10/07/2025. Medication should be delivered to PAP Delivery: Home. For further shipping updates, please contact [employer] ([employer]) at [phone]. Patient ID is: not provided.

## 2024-10-26 NOTE — Telephone Encounter (Signed)
 Reached out to patient regarding PAP application for Januvia  ([employer]) left HIPAA compliant v/m for return call for any assistance.

## 2024-11-11 ENCOUNTER — Ambulatory Visit: Admitting: Endocrinology

## 2024-11-18 ENCOUNTER — Ambulatory Visit: Admitting: Endocrinology

## 2024-12-31 ENCOUNTER — Encounter (HOSPITAL_BASED_OUTPATIENT_CLINIC_OR_DEPARTMENT_OTHER): Admitting: Family

## 2025-06-01 ENCOUNTER — Ambulatory Visit
